# Patient Record
Sex: Male | Born: 1970 | Race: Black or African American | Hispanic: No | Marital: Single | Smoking: Never smoker
Health system: Southern US, Community
[De-identification: ages and names within clinical notes are randomized; demographics above are authoritative.]

## PROBLEM LIST (undated history)

## (undated) DIAGNOSIS — S46219A Strain of muscle, fascia and tendon of other parts of biceps, unspecified arm, initial encounter: Secondary | ICD-10-CM

## (undated) DIAGNOSIS — K219 Gastro-esophageal reflux disease without esophagitis: Secondary | ICD-10-CM

## (undated) DIAGNOSIS — I10 Essential (primary) hypertension: Secondary | ICD-10-CM

## (undated) DIAGNOSIS — R002 Palpitations: Secondary | ICD-10-CM

## (undated) DIAGNOSIS — E876 Hypokalemia: Secondary | ICD-10-CM

## (undated) DIAGNOSIS — R569 Unspecified convulsions: Secondary | ICD-10-CM

## (undated) DIAGNOSIS — E785 Hyperlipidemia, unspecified: Secondary | ICD-10-CM

## (undated) DIAGNOSIS — T7840XA Allergy, unspecified, initial encounter: Secondary | ICD-10-CM

## (undated) HISTORY — PX: WRIST SURGERY: SHX841

## (undated) HISTORY — DX: Gastro-esophageal reflux disease without esophagitis: K21.9

## (undated) HISTORY — PX: BRAIN SURGERY: SHX531

## (undated) HISTORY — DX: Allergy, unspecified, initial encounter: T78.40XA

## (undated) HISTORY — PX: KNEE ARTHROSCOPY: SUR90

## (undated) HISTORY — PX: FOOT SURGERY: SHX648

## (undated) HISTORY — DX: Hyperlipidemia, unspecified: E78.5

---
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
## 1997-08-26 ENCOUNTER — Emergency Department (HOSPITAL_COMMUNITY): Admission: EM | Admit: 1997-08-26 | Discharge: 1997-08-26 | Payer: Self-pay | Admitting: Emergency Medicine

## 1997-10-17 ENCOUNTER — Emergency Department (HOSPITAL_COMMUNITY): Admission: EM | Admit: 1997-10-17 | Discharge: 1997-10-17 | Payer: Self-pay | Admitting: Emergency Medicine

## 1998-04-14 ENCOUNTER — Emergency Department (HOSPITAL_COMMUNITY): Admission: EM | Admit: 1998-04-14 | Discharge: 1998-04-14 | Payer: Self-pay | Admitting: Emergency Medicine

## 1998-08-11 ENCOUNTER — Emergency Department (HOSPITAL_COMMUNITY): Admission: EM | Admit: 1998-08-11 | Discharge: 1998-08-11 | Payer: Self-pay | Admitting: Emergency Medicine

## 1998-08-15 ENCOUNTER — Emergency Department (HOSPITAL_COMMUNITY): Admission: EM | Admit: 1998-08-15 | Discharge: 1998-08-15 | Payer: Self-pay | Admitting: Emergency Medicine

## 1999-05-09 ENCOUNTER — Emergency Department (HOSPITAL_COMMUNITY): Admission: EM | Admit: 1999-05-09 | Discharge: 1999-05-10 | Payer: Self-pay | Admitting: Emergency Medicine

## 1999-06-16 ENCOUNTER — Emergency Department (HOSPITAL_COMMUNITY): Admission: EM | Admit: 1999-06-16 | Discharge: 1999-06-16 | Payer: Self-pay | Admitting: Emergency Medicine

## 1999-07-01 ENCOUNTER — Emergency Department (HOSPITAL_COMMUNITY): Admission: EM | Admit: 1999-07-01 | Discharge: 1999-07-01 | Payer: Self-pay | Admitting: *Deleted

## 2001-01-19 ENCOUNTER — Emergency Department (HOSPITAL_COMMUNITY): Admission: EM | Admit: 2001-01-19 | Discharge: 2001-01-19 | Payer: Self-pay | Admitting: *Deleted

## 2001-06-02 ENCOUNTER — Emergency Department (HOSPITAL_COMMUNITY): Admission: EM | Admit: 2001-06-02 | Discharge: 2001-06-02 | Payer: Self-pay | Admitting: Emergency Medicine

## 2001-06-11 ENCOUNTER — Emergency Department (HOSPITAL_COMMUNITY): Admission: EM | Admit: 2001-06-11 | Discharge: 2001-06-11 | Payer: Self-pay | Admitting: Emergency Medicine

## 2001-10-24 ENCOUNTER — Emergency Department (HOSPITAL_COMMUNITY): Admission: EM | Admit: 2001-10-24 | Discharge: 2001-10-25 | Payer: Self-pay | Admitting: Emergency Medicine

## 2003-06-21 ENCOUNTER — Emergency Department (HOSPITAL_COMMUNITY): Admission: EM | Admit: 2003-06-21 | Discharge: 2003-06-21 | Payer: Self-pay | Admitting: Emergency Medicine

## 2003-11-26 ENCOUNTER — Emergency Department (HOSPITAL_COMMUNITY): Admission: EM | Admit: 2003-11-26 | Discharge: 2003-11-27 | Payer: Self-pay | Admitting: Emergency Medicine

## 2003-11-27 IMAGING — CT CT ABDOMEN W/ CM
1 of 4 series · 14 of 32 positions shown, 19 images · IV contrast (omnipaque)
Comparison: none

CLINICAL DATA: Right-sided abdominal pain and swelling for 1 week.  Question of appendicitis.  The patient reports that he may have had his appendix removed in the past, but he is unsure. 
CT OF THE ABDOMEN AND PELVIS WITH CONTRAST
Multidetector helical CT imaging was performed, and an IV bolus injection of 150 cc Omnipaque 300 oral contrast was given.  
Abdomen:  There are scattered calcified granulomata in the liver and spleen.  Those organs otherwise appear normal.  The gallbladder, pancreas, adrenal glands, and kidneys appear normal.  No mass or inflammatory process is seen.  Surgical clips are noted posterior to the cecal tip on images 50 through 56, compatible with previous appendectomy.  There is no evidence of a distended appendix or pericecal inflammatory change.  The lung bases are clear.

[Series 3: — · axial · 0.70mm/px · z∈[+750,+1120]mm · 14 of 84 slices shown, 19 images]
[im 5/84  soft-tissue]
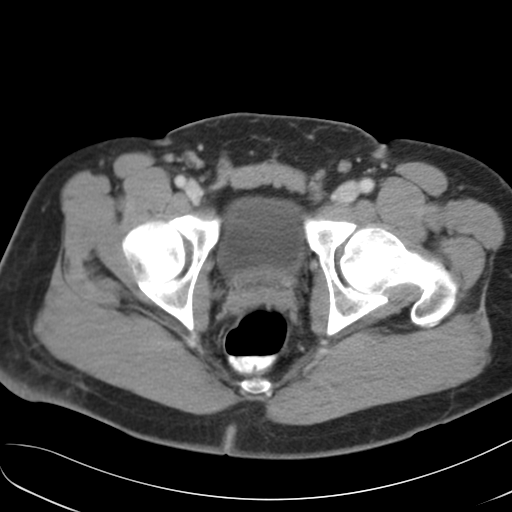
[im 5/84  bone]
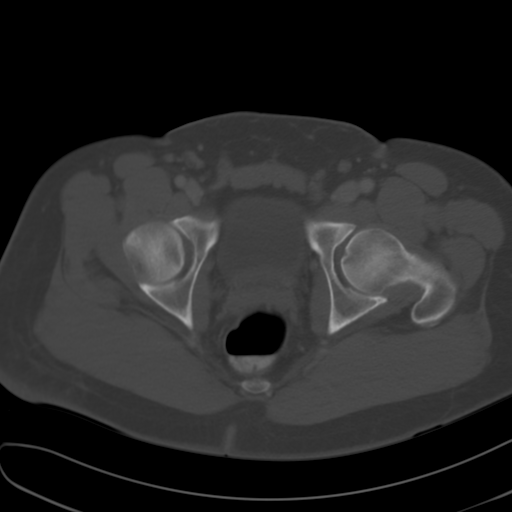
[im 10/84  soft-tissue]
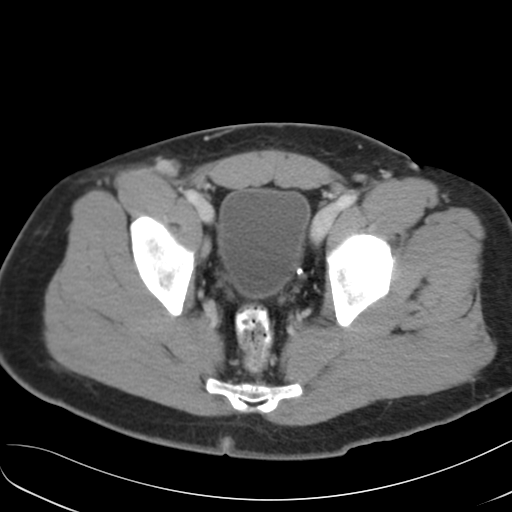
[im 19/84  soft-tissue]
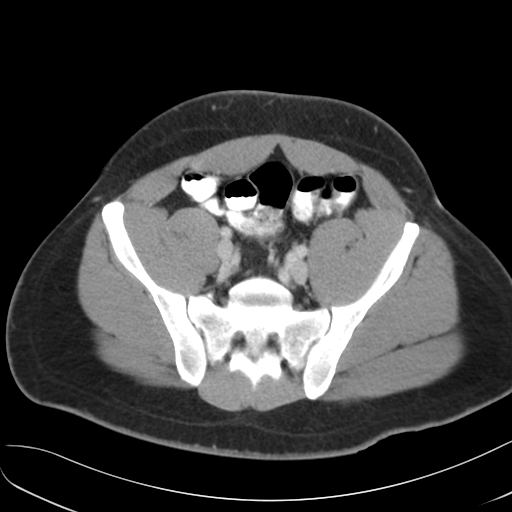
[im 24/84  soft-tissue]
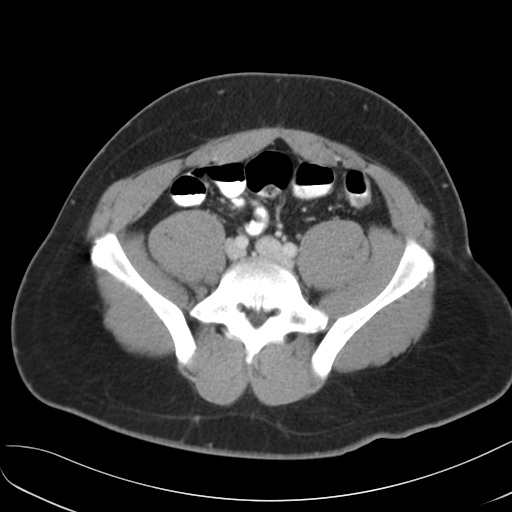
[im 28/84  soft-tissue]
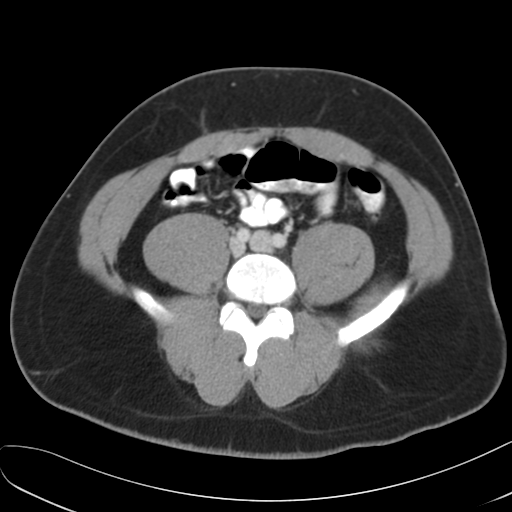
[im 37/84  soft-tissue]
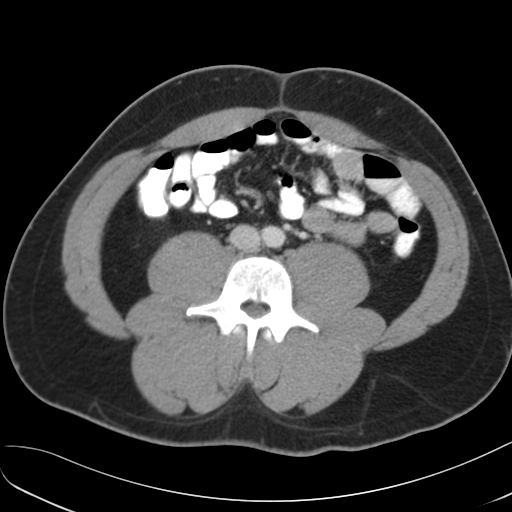
[im 42/84  soft-tissue]
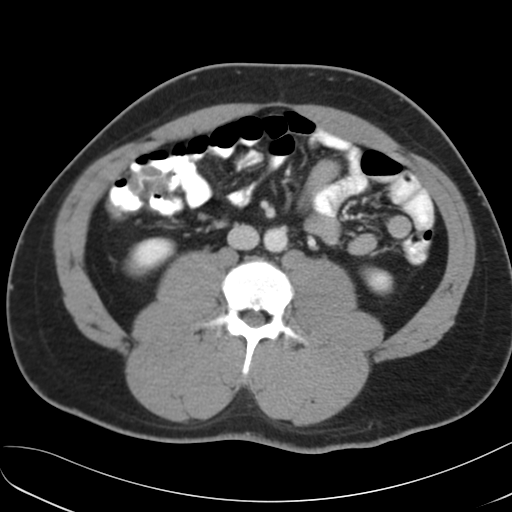
[im 47/84  soft-tissue]
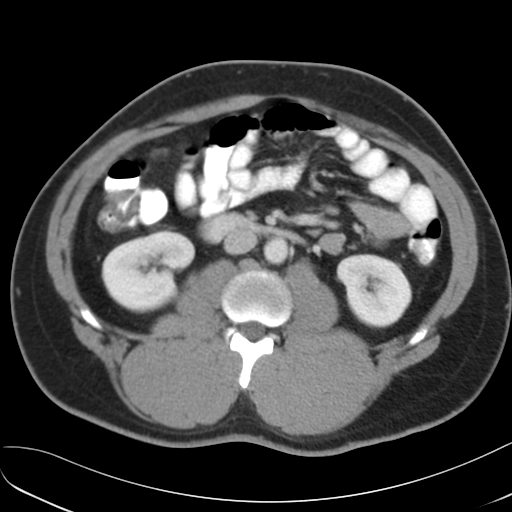
[im 56/84  soft-tissue]
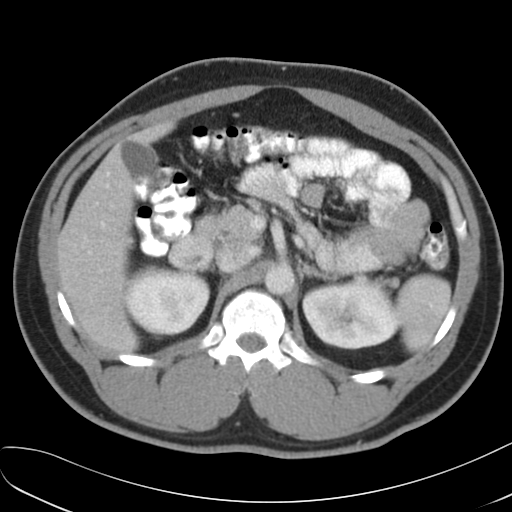
[im 56/84  bone]
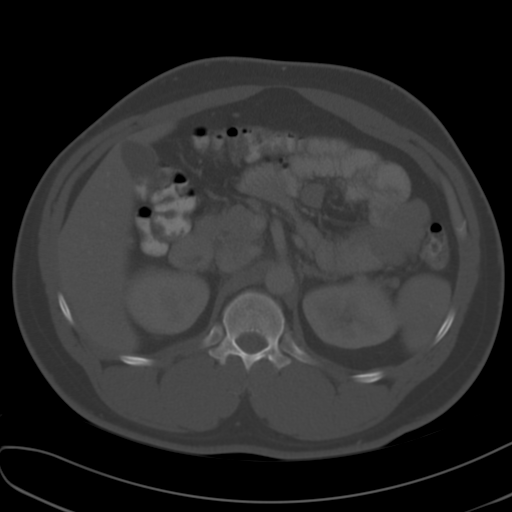
[im 60/84  soft-tissue]
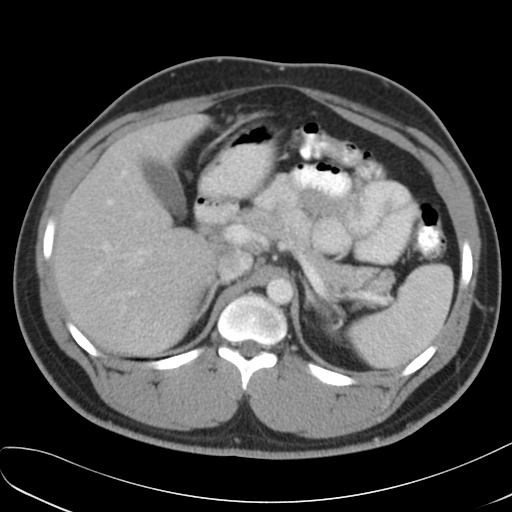
[im 65/84  soft-tissue]
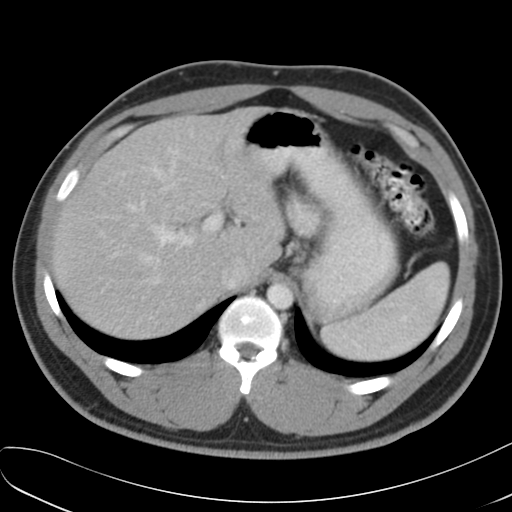
[im 65/84  lung]
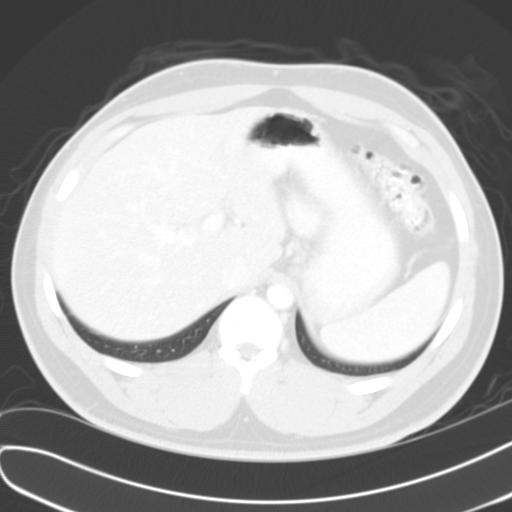
[im 70/84  lung]
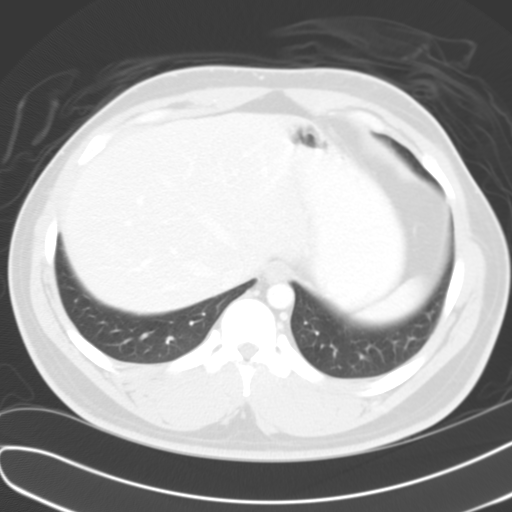
[im 74/84  soft-tissue]
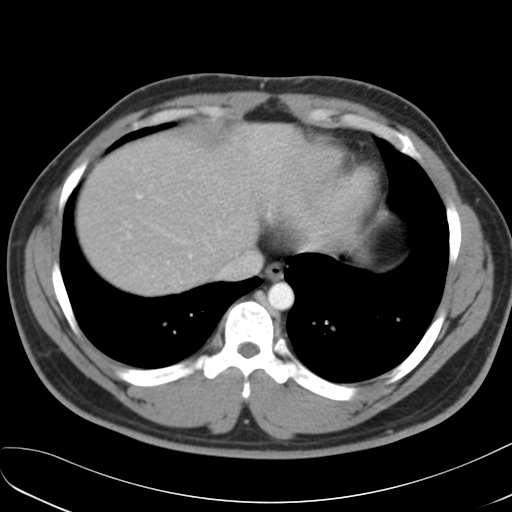
[im 74/84  lung]
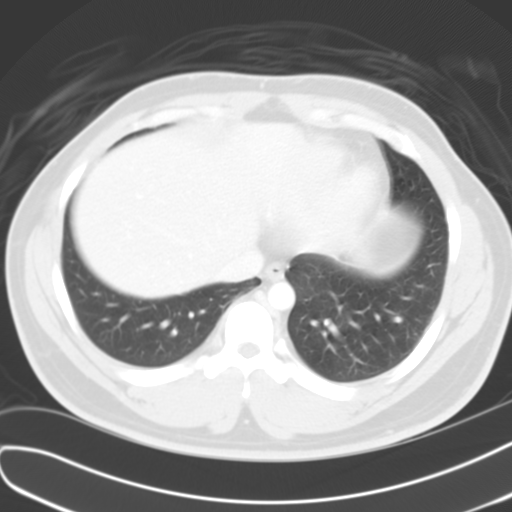
[im 79/84  soft-tissue]
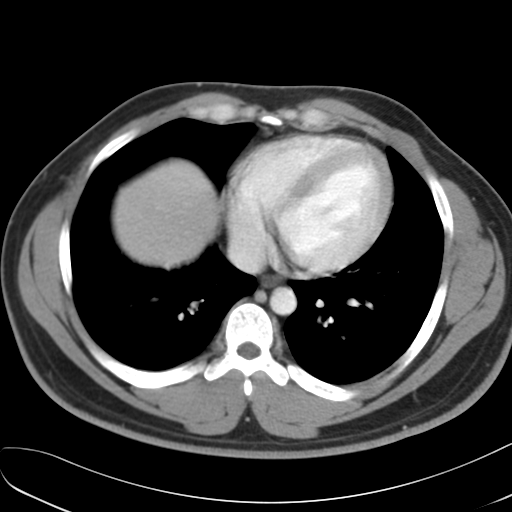
[im 79/84  lung]
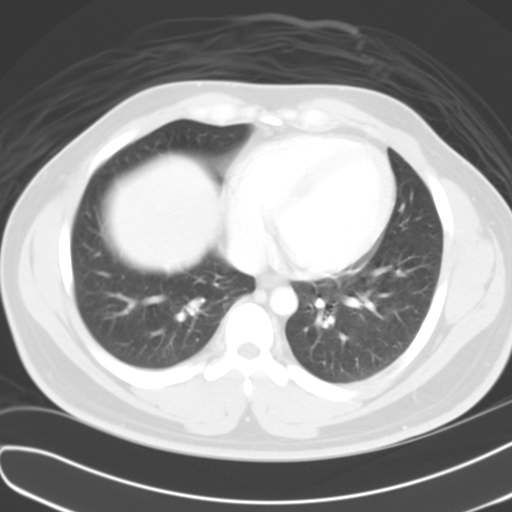

[14 of 32 positions shown; findings below may reference images not displayed]

IMPRESSION: 1.  The patient does indeed appear to be status post appendectomy. 
2.  There is no evidence of appendicitis or other active inflammatory process. 
Pelvis:  Surgical clip is noted between the seminal vesicles and the rectum.  The prostate gland appears mildly enlarged.  No pelvic mass, fluid collection, or inflammatory process is seen.  There is a phlebolith on the left and no evidence of ureterectasis.
IMPRESSION: Pelvic surgical clip, as described.  No active inflammatory process demonstrated.

## 2003-11-30 ENCOUNTER — Emergency Department (HOSPITAL_COMMUNITY): Admission: EM | Admit: 2003-11-30 | Discharge: 2003-11-30 | Payer: Self-pay | Admitting: Emergency Medicine

## 2003-12-05 ENCOUNTER — Emergency Department (HOSPITAL_COMMUNITY): Admission: EM | Admit: 2003-12-05 | Discharge: 2003-12-05 | Payer: Self-pay | Admitting: Emergency Medicine

## 2005-01-17 ENCOUNTER — Emergency Department (HOSPITAL_COMMUNITY): Admission: EM | Admit: 2005-01-17 | Discharge: 2005-01-17 | Payer: Self-pay | Admitting: Emergency Medicine

## 2006-06-08 ENCOUNTER — Emergency Department (HOSPITAL_COMMUNITY): Admission: EM | Admit: 2006-06-08 | Discharge: 2006-06-08 | Payer: Self-pay | Admitting: Emergency Medicine

## 2006-07-25 ENCOUNTER — Emergency Department (HOSPITAL_COMMUNITY): Admission: EM | Admit: 2006-07-25 | Discharge: 2006-07-25 | Payer: Self-pay | Admitting: Emergency Medicine

## 2010-10-29 ENCOUNTER — Emergency Department (HOSPITAL_COMMUNITY): Payer: No Typology Code available for payment source

## 2010-10-29 ENCOUNTER — Emergency Department (HOSPITAL_COMMUNITY)
Admission: EM | Admit: 2010-10-29 | Discharge: 2010-10-29 | Disposition: A | Discharge status: Home / Self Care | Payer: No Typology Code available for payment source | Attending: Emergency Medicine | Admitting: Emergency Medicine

## 2010-10-29 DIAGNOSIS — IMO0002 Reserved for concepts with insufficient information to code with codable children: Secondary | ICD-10-CM | POA: Insufficient documentation

## 2010-10-29 DIAGNOSIS — M542 Cervicalgia: Secondary | ICD-10-CM | POA: Insufficient documentation
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
## 2011-02-07 ENCOUNTER — Ambulatory Visit (HOSPITAL_COMMUNITY)
Admission: RE | Admit: 2011-02-07 | Discharge: 2011-02-07 | Disposition: A | Discharge status: Home / Self Care | Payer: Self-pay | Source: Ambulatory Visit | Attending: Orthopedic Surgery | Admitting: Orthopedic Surgery

## 2011-02-07 ENCOUNTER — Other Ambulatory Visit (HOSPITAL_COMMUNITY): Payer: Self-pay | Admitting: Orthopedic Surgery

## 2011-02-07 DIAGNOSIS — Z01818 Encounter for other preprocedural examination: Secondary | ICD-10-CM | POA: Insufficient documentation

## 2011-02-07 IMAGING — CR DG ORBITS FOR FOREIGN BODY
2 series · 2 of 2 positions shown · non-contrast
Comparison: None.

CLINICAL DATA: Pre MRI.

ORBITS FOR FOREIGN BODY - 2 VIEW

[w waters pa (1 of 2)]
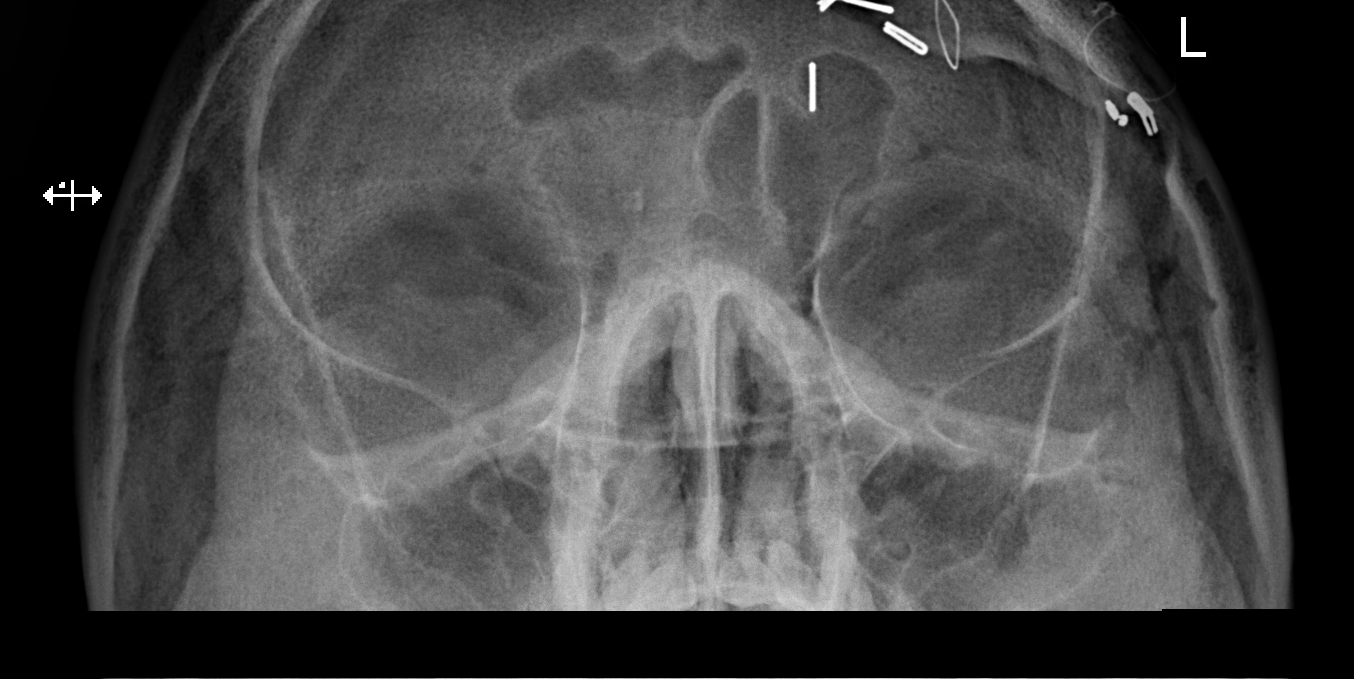

[w waters pa (2 of 2)]
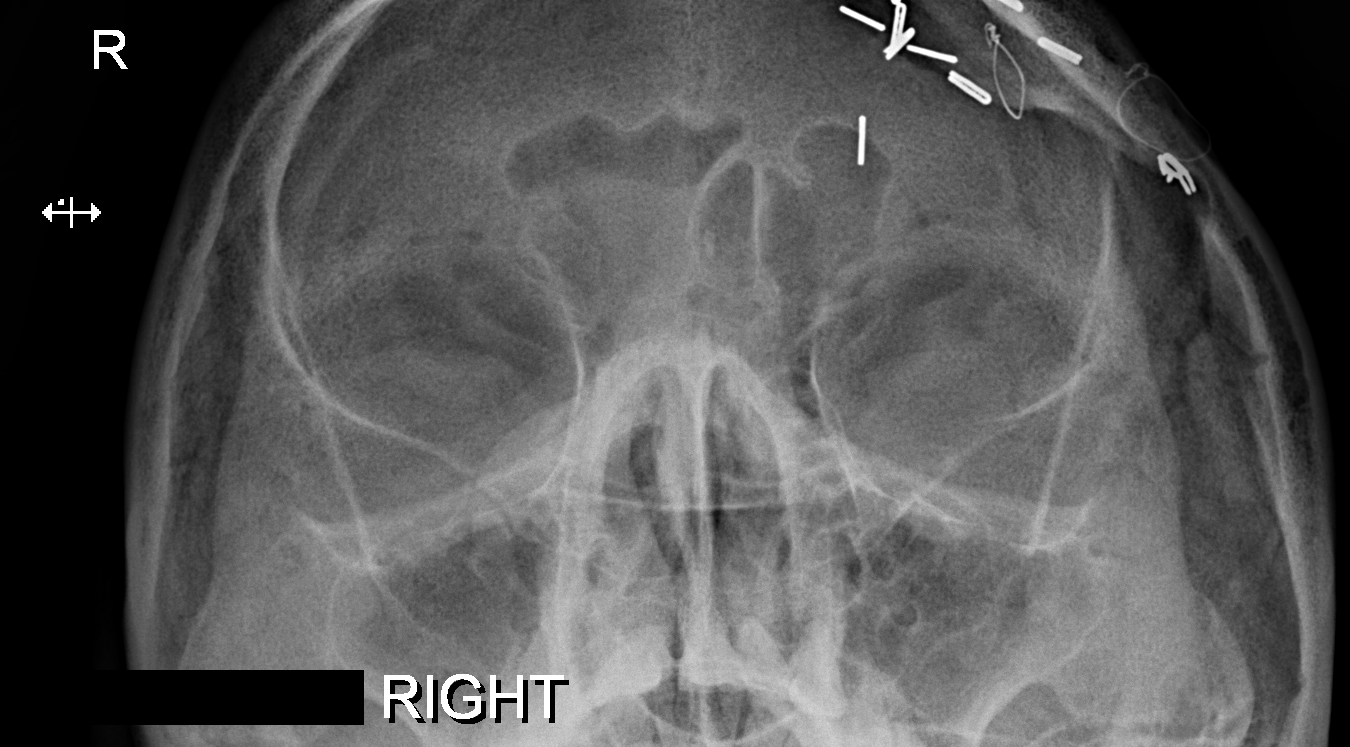

[2 of 2 positions shown; findings below may reference images not displayed]

FINDINGS: Negative for foreign body within the orbit.

Numerous surgical clips are present in the left frontal region from
prior craniotomy.  Craniotomy flap has been attached with wire
sutures.
IMPRESSION: Negative for orbital foreign body.

Numerous surgical clips overlying the left frontal lobe related to
prior craniotomy.  I am not able to determine if these are
intracranial or extracranial in location.  I would suggest CT of
the head for further evaluation to determine if the patient can
safely have MRI scanning.

## 2011-02-17 ENCOUNTER — Other Ambulatory Visit: Payer: Self-pay | Admitting: Orthopedic Surgery

## 2011-02-17 DIAGNOSIS — M545 Low back pain: Secondary | ICD-10-CM

## 2011-02-23 ENCOUNTER — Ambulatory Visit
Admission: RE | Admit: 2011-02-23 | Discharge: 2011-02-23 | Disposition: A | Discharge status: Home / Self Care | Payer: No Typology Code available for payment source | Source: Ambulatory Visit | Attending: Orthopedic Surgery | Admitting: Orthopedic Surgery

## 2011-02-23 DIAGNOSIS — M545 Low back pain, unspecified: Secondary | ICD-10-CM
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
## 2011-06-06 ENCOUNTER — Other Ambulatory Visit: Payer: Self-pay | Admitting: Orthopedic Surgery

## 2011-06-06 DIAGNOSIS — M48061 Spinal stenosis, lumbar region without neurogenic claudication: Secondary | ICD-10-CM

## 2011-06-07 ENCOUNTER — Inpatient Hospital Stay: Admission: RE | Admit: 2011-06-07 | Payer: No Typology Code available for payment source | Source: Ambulatory Visit

## 2011-06-08 ENCOUNTER — Ambulatory Visit
Admission: RE | Admit: 2011-06-08 | Discharge: 2011-06-08 | Disposition: A | Discharge status: Home / Self Care | Payer: No Typology Code available for payment source | Source: Ambulatory Visit | Attending: Orthopedic Surgery | Admitting: Orthopedic Surgery

## 2011-06-08 DIAGNOSIS — M48061 Spinal stenosis, lumbar region without neurogenic claudication: Secondary | ICD-10-CM

## 2011-06-08 MED ORDER — IOHEXOL 180 MG/ML  SOLN
1.0000 mL | Freq: Once | INTRAMUSCULAR | Status: AC | PRN
  Administered 2011-06-08: 1 mL via EPIDURAL

## 2011-06-08 MED ORDER — METHYLPREDNISOLONE ACETATE 40 MG/ML INJ SUSP (RADIOLOG
120.0000 mg | Freq: Once | INTRAMUSCULAR | Status: AC
  Administered 2011-06-08: 120 mg via EPIDURAL

## 2011-08-03 ENCOUNTER — Other Ambulatory Visit: Payer: Self-pay | Admitting: Orthopedic Surgery

## 2011-08-03 DIAGNOSIS — M4800 Spinal stenosis, site unspecified: Secondary | ICD-10-CM

## 2011-08-09 ENCOUNTER — Ambulatory Visit
Admission: RE | Admit: 2011-08-09 | Discharge: 2011-08-09 | Disposition: A | Discharge status: Home / Self Care | Payer: Self-pay | Source: Ambulatory Visit | Attending: Orthopedic Surgery | Admitting: Orthopedic Surgery

## 2011-08-09 VITALS — BP 115/65 | HR 72

## 2011-08-09 DIAGNOSIS — M4800 Spinal stenosis, site unspecified: Secondary | ICD-10-CM
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
## 2011-08-09 MED ORDER — IOHEXOL 180 MG/ML  SOLN
20.0000 mL | Freq: Once | INTRAMUSCULAR | Status: AC | PRN
  Administered 2011-08-09: 20 mL via INTRATHECAL

## 2011-08-09 MED ORDER — DIAZEPAM 5 MG PO TABS
10.0000 mg | ORAL_TABLET | Freq: Once | ORAL | Status: AC
  Administered 2011-08-09: 10 mg via ORAL

## 2011-08-09 MED ORDER — MEPERIDINE HCL 100 MG/ML IJ SOLN
75.0000 mg | Freq: Once | INTRAMUSCULAR | Status: AC
  Administered 2011-08-09: 75 mg via INTRAMUSCULAR

## 2011-08-09 MED ORDER — ONDANSETRON HCL 4 MG/2ML IJ SOLN
4.0000 mg | Freq: Once | INTRAMUSCULAR | Status: AC
  Administered 2011-08-09: 4 mg via INTRAMUSCULAR

## 2011-08-09 NOTE — Progress Notes (Signed)
Resting comfortably on stomach on stretcher in nursing station awaiting CT scan.  jkl

## 2011-08-09 NOTE — Discharge Instructions (Signed)

## 2011-08-24 ENCOUNTER — Other Ambulatory Visit: Payer: Self-pay | Admitting: Orthopedic Surgery

## 2011-08-24 DIAGNOSIS — M48061 Spinal stenosis, lumbar region without neurogenic claudication: Secondary | ICD-10-CM

## 2011-08-25 ENCOUNTER — Ambulatory Visit
Admission: RE | Admit: 2011-08-25 | Discharge: 2011-08-25 | Disposition: A | Discharge status: Home / Self Care | Payer: Self-pay | Source: Ambulatory Visit | Attending: Orthopedic Surgery | Admitting: Orthopedic Surgery

## 2011-08-25 VITALS — BP 138/75 | HR 67

## 2011-08-25 DIAGNOSIS — M48061 Spinal stenosis, lumbar region without neurogenic claudication: Secondary | ICD-10-CM

## 2011-08-25 DIAGNOSIS — M5126 Other intervertebral disc displacement, lumbar region: Secondary | ICD-10-CM

## 2011-08-25 MED ORDER — IOHEXOL 180 MG/ML  SOLN
1.0000 mL | Freq: Once | INTRAMUSCULAR | Status: AC | PRN
  Administered 2011-08-25: 1 mL via EPIDURAL

## 2011-08-25 MED ORDER — METHYLPREDNISOLONE ACETATE 40 MG/ML INJ SUSP (RADIOLOG
120.0000 mg | Freq: Once | INTRAMUSCULAR | Status: AC
  Administered 2011-08-25: 120 mg via EPIDURAL

## 2011-08-25 NOTE — Discharge Instructions (Signed)
Post Procedure Spinal Discharge Instruction Sheet  1. You may resume a regular diet and any medications that you routinely take (including pain medications).  2. No driving day of procedure.  3. Light activity throughout the rest of the day.  Do not do any strenuous work, exercise, bending or lifting.  The day following the procedure, you can resume normal physical activity but you should refrain from exercising or physical therapy for at least three days thereafter.   Common Side Effects:   Headaches- take your usual medications as directed by your physician.  Increase your fluid intake.  Caffeinated beverages may be helpful.  Lie flat in bed until your headache resolves.   Restlessness or inability to sleep- you may have trouble sleeping for the next few days.  Ask your referring physician if you need any medication for sleep.   Facial flushing or redness- should subside within a few days.   Increased pain- a temporary increase in pain a day or two following your procedure is not unusual.  Take your pain medication as prescribed by your referring physician.   Leg cramps  Please contact our office at [phone] for the following symptoms:  Fever greater than 100 degrees.  Headaches unresolved with medication after 2-3 days.  Increased swelling, pain, or redness at injection site.  Thank you for visiting our office.

## 2012-03-22 ENCOUNTER — Encounter (HOSPITAL_COMMUNITY): Payer: Self-pay | Admitting: *Deleted

## 2012-03-22 ENCOUNTER — Emergency Department (HOSPITAL_COMMUNITY)
Admission: EM | Admit: 2012-03-22 | Discharge: 2012-03-22 | Disposition: A | Discharge status: Home / Self Care | Payer: Self-pay | Attending: Emergency Medicine | Admitting: Emergency Medicine

## 2012-03-22 DIAGNOSIS — K0889 Other specified disorders of teeth and supporting structures: Secondary | ICD-10-CM

## 2012-03-22 DIAGNOSIS — K089 Disorder of teeth and supporting structures, unspecified: Secondary | ICD-10-CM | POA: Insufficient documentation

## 2012-03-22 MED ORDER — OXYCODONE-ACETAMINOPHEN 5-325 MG PO TABS: 1.0000 | ORAL_TABLET | Freq: Four times a day (QID) | ORAL | Status: DC | PRN

## 2012-03-22 MED ORDER — PENICILLIN V POTASSIUM 500 MG PO TABS: 500.0000 mg | ORAL_TABLET | Freq: Four times a day (QID) | ORAL | Status: AC

## 2012-03-22 NOTE — ED Notes (Signed)
Pt alert and oriented x4. Respirations even and unlabored, bilateral symmetrical rise and fall of chest. Skin warm and dry. In no acute distress. Denies needs.   

## 2012-03-22 NOTE — ED Provider Notes (Signed)
History     CSN: [id number]  Arrival date & time 03/22/12  7829   First MD Initiated Contact with Patient 03/22/12 [phone]      Chief Complaint  Patient presents with  . Dental Pain    (Consider location/radiation/quality/duration/timing/severity/associated sxs/prior treatment) HPI Comments: Patient presents today with a chief complaint of dental pain.  Pain has been intermittent over the past month.  Pain located on the upper and lower right molar teeth.  Pain worse with chewing.  He has taken Advil for pain, which helps somewhat.  He does not have a dentist.  Patient is a 41 y.o. male presenting with tooth pain. The history is provided by the patient.  Dental PainThe primary symptoms include mouth pain. Primary symptoms do not include dental injury, oral bleeding, oral lesions or fever. Episode onset: one month ago. The symptoms are worsening.  Additional symptoms include: dental sensitivity to temperature, gum swelling and gum tenderness. Additional symptoms do not include: purulent gums, trismus, facial swelling, trouble swallowing, pain with swallowing and drooling.    History reviewed. No pertinent past medical history.  Past Surgical History  Procedure Date  . Brain surgery     1981 blot clot removed  . Foot surgery     right bunion removed    History reviewed. No pertinent family history.  History  Substance Use Topics  . Smoking status: Never Smoker   . Smokeless tobacco: Never Used  . Alcohol Use: Yes     occasional      Review of Systems  Constitutional: Negative for fever and chills.  HENT: Positive for dental problem. Negative for facial swelling, drooling, trouble swallowing, neck pain and neck stiffness.   Gastrointestinal: Negative for nausea and vomiting.    Allergies  Review of patient's allergies indicates no known allergies.  Home Medications  No current outpatient prescriptions on file.  BP 149/83  Pulse 76  Temp 99.1 F (37.3 C) (Oral)   Resp 16  SpO2 97%  Physical Exam  Nursing note and vitals reviewed. Constitutional: He is oriented to person, place, and time. He appears well-developed and well-nourished. No distress.  HENT:  Head: Normocephalic and atraumatic. No trismus in the jaw.  Mouth/Throat: Uvula is midline, oropharynx is clear and moist and mucous membranes are normal. Abnormal dentition. No dental abscesses or uvula swelling. No oropharyngeal exudate, posterior oropharyngeal edema, posterior oropharyngeal erythema or tonsillar abscesses.       Poor dental hygiene. Pt able to open and close mouth with out difficulty. Airway intact. Uvula midline. Mild gingival swelling with tenderness over affected area, but no fluctuance. No swelling or tenderness of submental and submandibular regions.  Neck: Normal range of motion and full passive range of motion without pain. Neck supple.  Cardiovascular: Normal rate and regular rhythm.   Pulmonary/Chest: Effort normal and breath sounds normal.  Musculoskeletal: Normal range of motion.  Lymphadenopathy:       Head (right side): No submental, no submandibular, no tonsillar, no preauricular and no posterior auricular adenopathy present.       Head (left side): No submental, no submandibular, no tonsillar, no preauricular and no posterior auricular adenopathy present.    He has no cervical adenopathy.  Neurological: He is alert and oriented to person, place, and time.  Skin: Skin is warm and dry. No rash noted. He is not diaphoretic.    ED Course  Procedures (including critical care time)  Labs Reviewed - No data to display No results found.  No diagnosis found.    MDM  Patient with toothache.  No gross abscess.  Exam unconcerning for Ludwig's angina or spread of infection.  Will treat with penicillin and pain medicine.  Urged patient to follow-up with dentist.          Pascal Lux [place], PA-C 03/22/12 [phone]

## 2012-03-22 NOTE — ED Notes (Signed)
Pt reports upper and lower right sided tooth pain, pain has increased over last month 10/10 to where pt cannot eat on right side of mouth. Has not seen dentist for complaint. Has had 2 teeth removed from bottom right side. Reports bottom gum is swollen. Does not have a primary dentist.

## 2012-03-23 NOTE — ED Provider Notes (Signed)
Medical screening examination/treatment/procedure(s) were performed by non-physician practitioner and as supervising physician I was immediately available for consultation/collaboration.  Bensyn Bornemann L Tayson Schnelle, MD 03/23/12 0957 

## 2012-06-07 ENCOUNTER — Emergency Department (HOSPITAL_BASED_OUTPATIENT_CLINIC_OR_DEPARTMENT_OTHER)
Admission: EM | Admit: 2012-06-07 | Discharge: 2012-06-07 | Disposition: A | Discharge status: Home / Self Care | Payer: Self-pay | Attending: Emergency Medicine | Admitting: Emergency Medicine

## 2012-06-07 ENCOUNTER — Encounter (HOSPITAL_BASED_OUTPATIENT_CLINIC_OR_DEPARTMENT_OTHER): Payer: Self-pay | Admitting: *Deleted

## 2012-06-07 DIAGNOSIS — K0889 Other specified disorders of teeth and supporting structures: Secondary | ICD-10-CM

## 2012-06-07 DIAGNOSIS — K089 Disorder of teeth and supporting structures, unspecified: Secondary | ICD-10-CM | POA: Insufficient documentation

## 2012-06-07 DIAGNOSIS — R221 Localized swelling, mass and lump, neck: Secondary | ICD-10-CM | POA: Insufficient documentation

## 2012-06-07 DIAGNOSIS — R220 Localized swelling, mass and lump, head: Secondary | ICD-10-CM | POA: Insufficient documentation

## 2012-06-07 MED ORDER — HYDROCODONE-ACETAMINOPHEN 5-325 MG PO TABS: 2.0000 | ORAL_TABLET | ORAL | Status: DC | PRN

## 2012-06-07 MED ORDER — AMOXICILLIN 500 MG PO CAPS: 500.0000 mg | ORAL_CAPSULE | Freq: Three times a day (TID) | ORAL | Status: DC

## 2012-06-07 NOTE — ED Provider Notes (Signed)
Medical screening examination/treatment/procedure(s) were performed by non-physician practitioner and as supervising physician I was immediately available for consultation/collaboration.   Sione Baumgarten B. Bernette Mayers, MD 06/07/12 1902

## 2012-06-07 NOTE — ED Notes (Signed)
Toothache x 2 days 

## 2012-06-07 NOTE — ED Provider Notes (Signed)
History     CSN: [id number]  Arrival date & time 06/07/12  1719   First MD Initiated Contact with Patient 06/07/12 1805      Chief Complaint  Patient presents with  . Dental Pain    (Consider location/radiation/quality/duration/timing/severity/associated sxs/prior treatment) Patient is a 42 y.o. male presenting with tooth pain. The history is provided by the patient. No language interpreter was used.  Dental PainThe primary symptoms include mouth pain. The symptoms began 2 days ago. The symptoms are worsening. The symptoms are new. The symptoms occur constantly.  Additional symptoms include: gum swelling and gum tenderness.    History reviewed. No pertinent past medical history.  Past Surgical History  Procedure Date  . Brain surgery     1981 blot clot removed  . Foot surgery     right bunion removed    No family history on file.  History  Substance Use Topics  . Smoking status: Never Smoker   . Smokeless tobacco: Never Used  . Alcohol Use: Yes     Comment: occasional      Review of Systems  HENT: Positive for dental problem.   All other systems reviewed and are negative.    Allergies  Review of patient's allergies indicates no known allergies.  Home Medications   Current Outpatient Rx  Name  Route  Sig  Dispense  Refill  . IBUPROFEN-DIPHENHYDRAMINE CIT 200-38 MG PO TABS   Oral   Take 2 tablets by mouth at bedtime as needed.         . OXYCODONE-ACETAMINOPHEN 5-325 MG PO TABS   Oral   Take 1-2 tablets by mouth every 6 (six) hours as needed for pain.   20 tablet   0     BP 152/92  Pulse 82  Temp 99 F (37.2 C) (Oral)  Resp 20  SpO2 100%  Physical Exam  Nursing note and vitals reviewed. Constitutional: He appears well-developed.  HENT:       No swelling, gum normal  Eyes: Conjunctivae normal are normal. Pupils are equal, round, and reactive to light.  Musculoskeletal: Normal range of motion.  Neurological: He is alert.  Skin: Skin is  warm.  Psychiatric: He has a normal mood and affect.    ED Course  Procedures (including critical care time)  Labs Reviewed - No data to display No results found.   No diagnosis found.    MDM  Pt advised to schedule to see the dentist for evaluation       Elson Areas, [place] 06/07/12 1901

## 2012-06-12 ENCOUNTER — Emergency Department (HOSPITAL_COMMUNITY)
Admission: EM | Admit: 2012-06-12 | Discharge: 2012-06-12 | Disposition: A | Discharge status: Home / Self Care | Payer: Self-pay | Attending: Emergency Medicine | Admitting: Emergency Medicine

## 2012-06-12 ENCOUNTER — Encounter (HOSPITAL_COMMUNITY): Payer: Self-pay | Admitting: *Deleted

## 2012-06-12 DIAGNOSIS — K0889 Other specified disorders of teeth and supporting structures: Secondary | ICD-10-CM

## 2012-06-12 DIAGNOSIS — K089 Disorder of teeth and supporting structures, unspecified: Secondary | ICD-10-CM | POA: Insufficient documentation

## 2012-06-12 DIAGNOSIS — Z8669 Personal history of other diseases of the nervous system and sense organs: Secondary | ICD-10-CM | POA: Insufficient documentation

## 2012-06-12 HISTORY — DX: Unspecified convulsions: R56.9

## 2012-06-12 MED ORDER — OXYCODONE-ACETAMINOPHEN 5-325 MG PO TABS: 1.0000 | ORAL_TABLET | Freq: Four times a day (QID) | ORAL | Status: DC | PRN

## 2012-06-12 MED ORDER — NAPROXEN 500 MG PO TABS: 500.0000 mg | ORAL_TABLET | Freq: Two times a day (BID) | ORAL | Status: DC

## 2012-06-12 NOTE — ED Provider Notes (Signed)
History     CSN: [id number]  Arrival date & time 06/12/12  1127   First MD Initiated Contact with Patient 06/12/12 1304      Chief Complaint  Patient presents with  . Dental Pain    (Consider location/radiation/quality/duration/timing/severity/associated sxs/prior treatment) HPI Comments: Patient presents with history of dental pain with right lower tooth pain x 1 week. He has had pain in this tooth for the past 2 months, however it has gotten worse over the past week. He was evaluated at the ED 5 days ago and was prescribed Vicodin and antibiotics. He states his pain has gotten severe over the past 24 hours. It is painful for him to chew foods and drink cold liquids. He states his tooth hurts "even when the wind blows." He states the Vicodin has not helped with his pain. He does not have a dentist so at his last ED visit he was given a referral to a dentist. He tried to call the dentist but they informed him that they do not do tooth extractions. Onset gradual, course persistent.   Patient is a 42 y.o. male presenting with tooth pain. The history is provided by the patient.  Dental PainPrimary symptoms do not include headaches, fever, shortness of breath or sore throat.  Additional symptoms do not include: facial swelling, trouble swallowing, ear pain and fatigue.    Past Medical History  Diagnosis Date  . Seizures     Past Surgical History  Procedure Date  . Brain surgery     1981 blot clot removed  . Foot surgery     right bunion removed    History reviewed. No pertinent family history.  History  Substance Use Topics  . Smoking status: Never Smoker   . Smokeless tobacco: Never Used  . Alcohol Use: Yes     Comment: occasional      Review of Systems  Constitutional: Negative for fever, chills and fatigue.  HENT: Positive for dental problem. Negative for ear pain, congestion, sore throat, facial swelling, rhinorrhea, trouble swallowing, neck pain and neck stiffness.     Eyes: Negative for pain.  Respiratory: Negative for shortness of breath and stridor.   Skin: Negative for color change.  Neurological: Negative for headaches.    Allergies  Review of patient's allergies indicates no known allergies.  Home Medications   Current Outpatient Rx  Name  Route  Sig  Dispense  Refill  . HYDROCODONE-ACETAMINOPHEN 5-325 MG PO TABS   Oral   Take 1 tablet by mouth every 6 (six) hours as needed. pain           BP 160/83  Pulse 81  Temp 98.5 F (36.9 C) (Oral)  Resp 18  SpO2 100%  Physical Exam  Nursing note and vitals reviewed. Constitutional: He appears well-developed and well-nourished.  HENT:  Head: Normocephalic and atraumatic. No trismus in the jaw.  Right Ear: Tympanic membrane, external ear and ear canal normal.  Left Ear: Tympanic membrane, external ear and ear canal normal.  Nose: Nose normal.  Mouth/Throat: Uvula is midline, oropharynx is clear and moist and mucous membranes are normal. Abnormal dentition. Dental caries present. No dental abscesses, uvula swelling or lacerations. No oropharyngeal exudate or tonsillar abscesses.         Patient reports pain on right lower tooth.   Eyes: Conjunctivae normal and EOM are normal. Pupils are equal, round, and reactive to light.  Neck: Normal range of motion. Neck supple.  No neck swelling or Lugwig's angina  Lymphadenopathy:    He has no cervical adenopathy.  Neurological: He is alert.  Skin: Skin is warm and dry.  Psychiatric: He has a normal mood and affect.    ED Course  Procedures (including critical care time)  Labs Reviewed - No data to display No results found.   1. Pain, dental     Patient seen and examined.    Vital signs reviewed and are as follows: Filed Vitals:   06/12/12 1402  BP: 137/80  Pulse:   Temp:   Resp:   BP 137/80  Pulse 81  Temp 98.5 F (36.9 C) (Oral)  Resp 18  SpO2 100%   Patient counseled to take prescribed medications as directed,  return with worsening facial or neck swelling, and to follow-up with their dentist as soon as possible.   Patient counseled on use of narcotic pain medications. Counseled not to combine these medications with others containing tylenol. Urged not to drink alcohol, drive, or perform any other activities that requires focus while taking these medications. The patient verbalizes understanding and agrees with the plan.    MDM  Patient with toothache.  No gross abscess.  Exam unconcerning for Ludwig's angina or other deep tissue infection in neck.  Urged patient to follow-up with dentist.           Renne Crigler, PA 06/12/12 [phone]

## 2012-06-12 NOTE — Progress Notes (Signed)
During 06/12/12 [facility] ED visit Pt listed with no insurance coverage CM and [facility] liaison spoke with pt.  Pt offered services to assist with finding a [place] self pay provider & health reform information-- 

## 2012-06-12 NOTE — ED Provider Notes (Signed)
Medical screening examination/treatment/procedure(s) were performed by non-physician practitioner and as supervising physician I was immediately available for consultation/collaboration.  Wil Slape, MD 06/12/12 1629 

## 2012-06-12 NOTE — ED Notes (Signed)
Pt states having toothache on R lower side of mouth, unable to say how long he has had the toothache but states now it hurts all the time and pain is getting worse, where it use to hurt if he ate on the R side.

## 2013-01-05 ENCOUNTER — Encounter (HOSPITAL_COMMUNITY): Payer: Self-pay | Admitting: Emergency Medicine

## 2013-01-05 ENCOUNTER — Emergency Department (HOSPITAL_COMMUNITY)
Admission: EM | Admit: 2013-01-05 | Discharge: 2013-01-05 | Disposition: A | Discharge status: Home / Self Care | Payer: Self-pay | Attending: Emergency Medicine | Admitting: Emergency Medicine

## 2013-01-05 DIAGNOSIS — S30860A Insect bite (nonvenomous) of lower back and pelvis, initial encounter: Secondary | ICD-10-CM | POA: Insufficient documentation

## 2013-01-05 DIAGNOSIS — G40909 Epilepsy, unspecified, not intractable, without status epilepticus: Secondary | ICD-10-CM | POA: Insufficient documentation

## 2013-01-05 DIAGNOSIS — Y929 Unspecified place or not applicable: Secondary | ICD-10-CM | POA: Insufficient documentation

## 2013-01-05 DIAGNOSIS — Y9389 Activity, other specified: Secondary | ICD-10-CM | POA: Insufficient documentation

## 2013-01-05 DIAGNOSIS — W57XXXA Bitten or stung by nonvenomous insect and other nonvenomous arthropods, initial encounter: Secondary | ICD-10-CM | POA: Insufficient documentation

## 2013-01-05 NOTE — ED Notes (Signed)
Pt alert, nad, arrives from home, c/o ? Bite to mid back, unknown source, onset last week, resp even unlabored, skin pwd

## 2013-01-05 NOTE — ED Provider Notes (Signed)
CSN: [id number]     Arrival date & time 01/05/13  1442 History  This chart was scribed for Michael Sites, PA, working with Michael Roots, MD, by Michael Fleming ED Scribe. This patient was seen in room WTR8/WTR8 and the patient's care was started at 3:11 PM.   Chief Complaint  Patient presents with  . Wound Check    Mid Back    The history is provided by the patient. No language interpreter was used.    HPI Comments: EIN Michael Fleming is a 42 y.o. male who presents to the Emergency Department complaining of an insect bite to his middle back that occurred 3 days ago. He reports that he felt "a bug", although he is not sure which kind, was in his shirt and bit him in the back 3 days ago. He reports gradual onset, gradually worsening, mild pain surrounding the area of the bite. He denies associated itching or drainage from the wound. He denies having any other insect bites. He states that he has applied Neosporin without relief. He denies fever, chills, nausea, vomiting, rash, breathing difficulty, trouble swallowing or any other symptoms.   PCP- None   Past Medical History  Diagnosis Date  . Seizures    Past Surgical History  Procedure Laterality Date  . Brain surgery      1981 blot clot removed  . Foot surgery      right bunion removed   History reviewed. No pertinent family history. History  Substance Use Topics  . Smoking status: Never Smoker   . Smokeless tobacco: Never Used  . Alcohol Use: Yes     Comment: occasional    Review of Systems  Constitutional: Negative for fever and chills.  HENT: Negative for trouble swallowing.   Respiratory: Negative for shortness of breath and wheezing.   Gastrointestinal: Negative for nausea and vomiting.  Skin: Positive for wound (Insect bite). Negative for rash.  All other systems reviewed and are negative.    Allergies  Review of patient's allergies indicates no known allergies.  Home Medications  No current outpatient  prescriptions on file.  Triage Vitals: BP 159/80  Pulse 87  Temp(Src) 98 F (36.7 C) (Oral)  Resp 16  Wt 203 lb (92.08 kg)  BMI 31.79 kg/m2  SpO2 99%  Physical Exam  Nursing note and vitals reviewed. Constitutional: He is oriented to person, place, and time. He appears well-developed and well-nourished.  HENT:  Head: Normocephalic and atraumatic.  Eyes: Conjunctivae and EOM are normal. Pupils are equal, round, and reactive to light.  Neck: Normal range of motion. Neck supple.  Cardiovascular: Normal rate, regular rhythm and normal heart sounds.   Pulmonary/Chest: Effort normal and breath sounds normal.  Musculoskeletal: Normal range of motion.  Neurological: He is alert and oriented to person, place, and time.  Skin: Skin is warm and dry.  Mid thoracic lesion, mildly tender to palpation. No surrounding abscess, erythema, induration, drainage or signs of cellulitis.  Psychiatric: He has a normal mood and affect.    ED Course   Procedures (including critical care time)  DIAGNOSTIC STUDIES: Oxygen Saturation is 99% on RA, normal by my interpretation.    COORDINATION OF CARE: 3:16 PM- Pt advised of plan for treatment and pt agrees.   Labs Reviewed - No data to display  No results found.  1. Bug bite     MDM   Dog bite to midthoracic back without localized infection or abscess formation. Instructed to continue home wound care.  May take over-the-counter Benadryl as needed. Follow up with [facility] if symptoms change/worsen. Discussed plan with patient, he agreed. Return precautions advised.  I personally performed the services described in this documentation, which was scribed in my presence. The recorded information has been reviewed and is accurate.    Garlon Hatchet, PA-C 01/05/13 [phone]

## 2013-01-06 NOTE — ED Provider Notes (Signed)
Medical screening examination/treatment/procedure(s) were performed by non-physician practitioner and as supervising physician I was immediately available for consultation/collaboration.   Shaketa Serafin E Viren Lebeau, MD 01/06/13 1314 

## 2013-02-10 ENCOUNTER — Encounter (HOSPITAL_COMMUNITY): Payer: Self-pay | Admitting: Emergency Medicine

## 2013-02-10 ENCOUNTER — Emergency Department (HOSPITAL_COMMUNITY)
Admission: EM | Admit: 2013-02-10 | Discharge: 2013-02-10 | Disposition: A | Discharge status: Home / Self Care | Payer: No Typology Code available for payment source | Attending: Emergency Medicine | Admitting: Emergency Medicine

## 2013-02-10 ENCOUNTER — Emergency Department (HOSPITAL_COMMUNITY): Payer: No Typology Code available for payment source

## 2013-02-10 DIAGNOSIS — T148XXA Other injury of unspecified body region, initial encounter: Secondary | ICD-10-CM

## 2013-02-10 DIAGNOSIS — M545 Low back pain, unspecified: Secondary | ICD-10-CM

## 2013-02-10 DIAGNOSIS — Y92410 Unspecified street and highway as the place of occurrence of the external cause: Secondary | ICD-10-CM | POA: Insufficient documentation

## 2013-02-10 DIAGNOSIS — S335XXA Sprain of ligaments of lumbar spine, initial encounter: Secondary | ICD-10-CM | POA: Insufficient documentation

## 2013-02-10 DIAGNOSIS — M542 Cervicalgia: Secondary | ICD-10-CM | POA: Insufficient documentation

## 2013-02-10 DIAGNOSIS — Y9389 Activity, other specified: Secondary | ICD-10-CM | POA: Insufficient documentation
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
## 2013-02-10 MED ORDER — HYDROCODONE-ACETAMINOPHEN 5-325 MG PO TABS: 1.0000 | ORAL_TABLET | ORAL | Status: DC | PRN

## 2013-02-10 MED ORDER — CYCLOBENZAPRINE HCL 10 MG PO TABS: 10.0000 mg | ORAL_TABLET | Freq: Two times a day (BID) | ORAL | Status: DC | PRN

## 2013-02-10 MED ORDER — CYCLOBENZAPRINE HCL 10 MG PO TABS
5.0000 mg | ORAL_TABLET | Freq: Once | ORAL | Status: AC
  Administered 2013-02-10: 5 mg via ORAL
  Filled 2013-02-10: qty 1

## 2013-02-10 MED ORDER — HYDROCODONE-ACETAMINOPHEN 5-325 MG PO TABS
1.0000 | ORAL_TABLET | Freq: Once | ORAL | Status: AC
  Administered 2013-02-10: 1 via ORAL
  Filled 2013-02-10: qty 1
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
## 2013-02-10 NOTE — ED Provider Notes (Signed)
Medical screening examination/treatment/procedure(s) were performed by non-physician practitioner and as supervising physician I was immediately available for consultation/collaboration.   Charles B. Bernette Mayers, MD 02/10/13 [phone]

## 2013-02-19 ENCOUNTER — Ambulatory Visit (HOSPITAL_COMMUNITY)
Admission: RE | Admit: 2013-02-19 | Discharge: 2013-02-19 | Disposition: A | Discharge status: Home / Self Care | Payer: No Typology Code available for payment source | Source: Ambulatory Visit | Attending: Chiropractic Medicine | Admitting: Chiropractic Medicine

## 2013-02-19 ENCOUNTER — Other Ambulatory Visit (HOSPITAL_COMMUNITY): Payer: Self-pay | Admitting: Chiropractic Medicine

## 2013-02-19 DIAGNOSIS — M542 Cervicalgia: Secondary | ICD-10-CM
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
## 2013-03-21 ENCOUNTER — Other Ambulatory Visit (HOSPITAL_COMMUNITY): Payer: Self-pay | Admitting: Family Medicine

## 2013-03-21 ENCOUNTER — Ambulatory Visit (HOSPITAL_COMMUNITY)
Admission: RE | Admit: 2013-03-21 | Discharge: 2013-03-21 | Disposition: A | Discharge status: Home / Self Care | Payer: No Typology Code available for payment source | Source: Ambulatory Visit | Attending: Family Medicine | Admitting: Family Medicine

## 2013-03-21 DIAGNOSIS — M25532 Pain in left wrist: Secondary | ICD-10-CM

## 2013-03-21 DIAGNOSIS — M25539 Pain in unspecified wrist: Secondary | ICD-10-CM | POA: Insufficient documentation

## 2013-03-21 IMAGING — CR DG WRIST COMPLETE 3+V*L*
4 series · 4 of 4 positions shown · non-contrast
Comparison: None.

CLINICAL DATA: Left posterior rib pain stress that left posterior
wrist pain

EXAM:
LEFT WRIST - COMPLETE 3+ VIEW

[x wrist pa left]
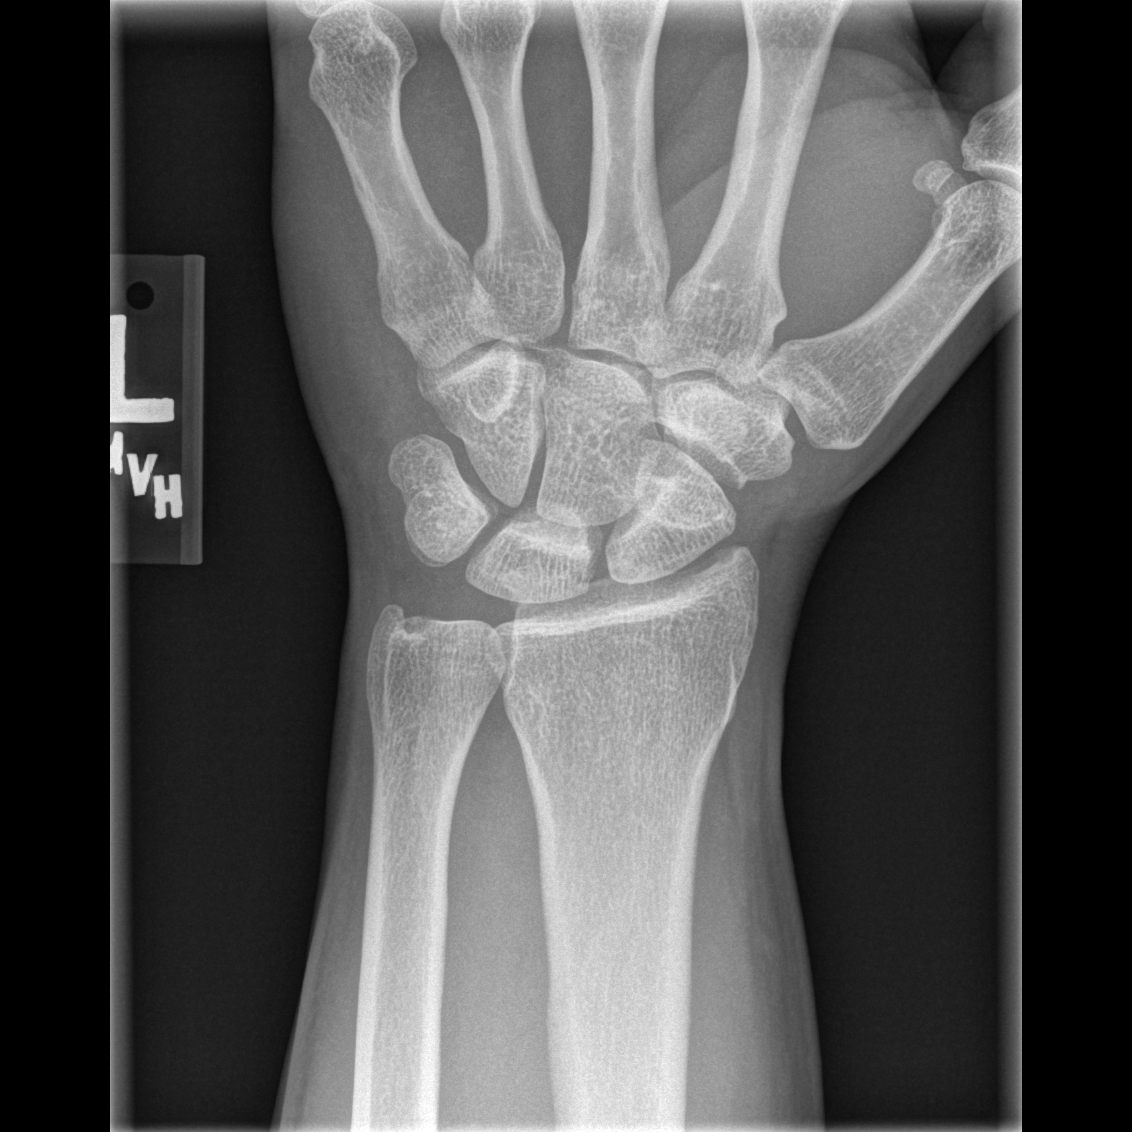

[x wrist obl left]
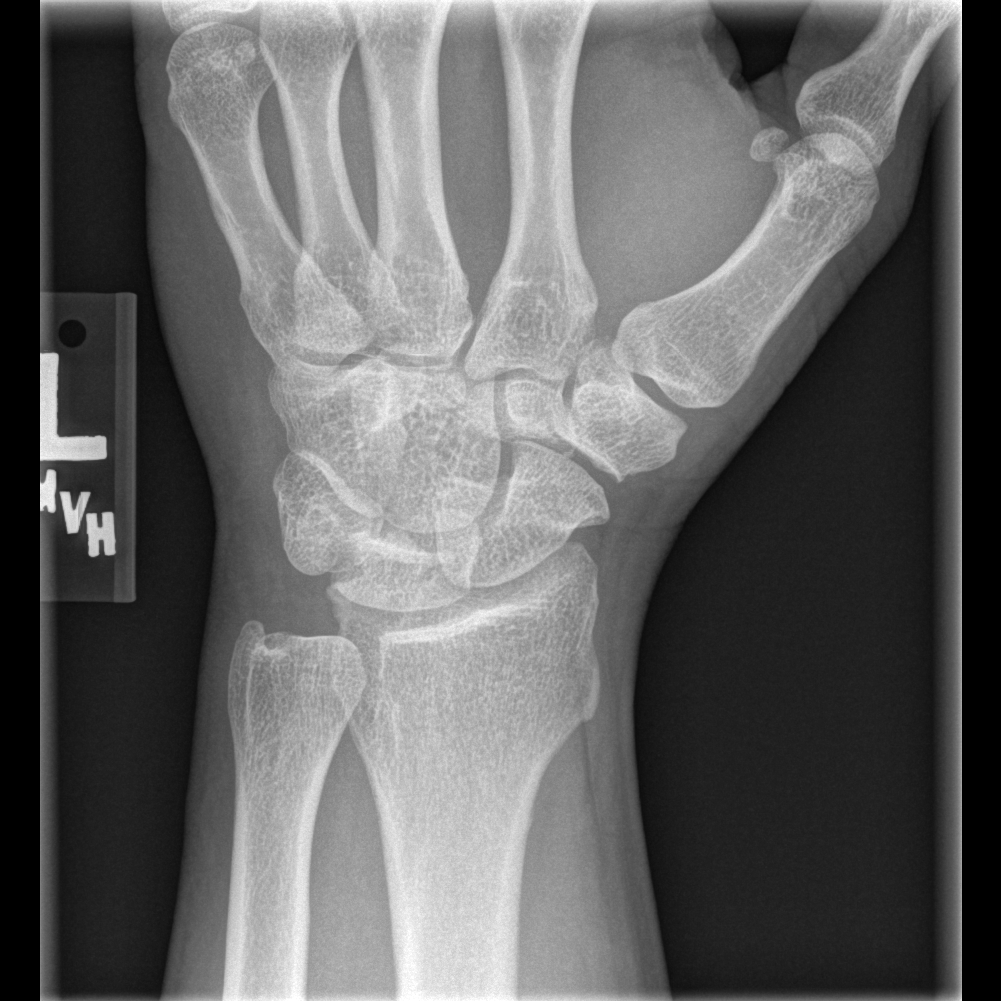

[x wrist lat left]
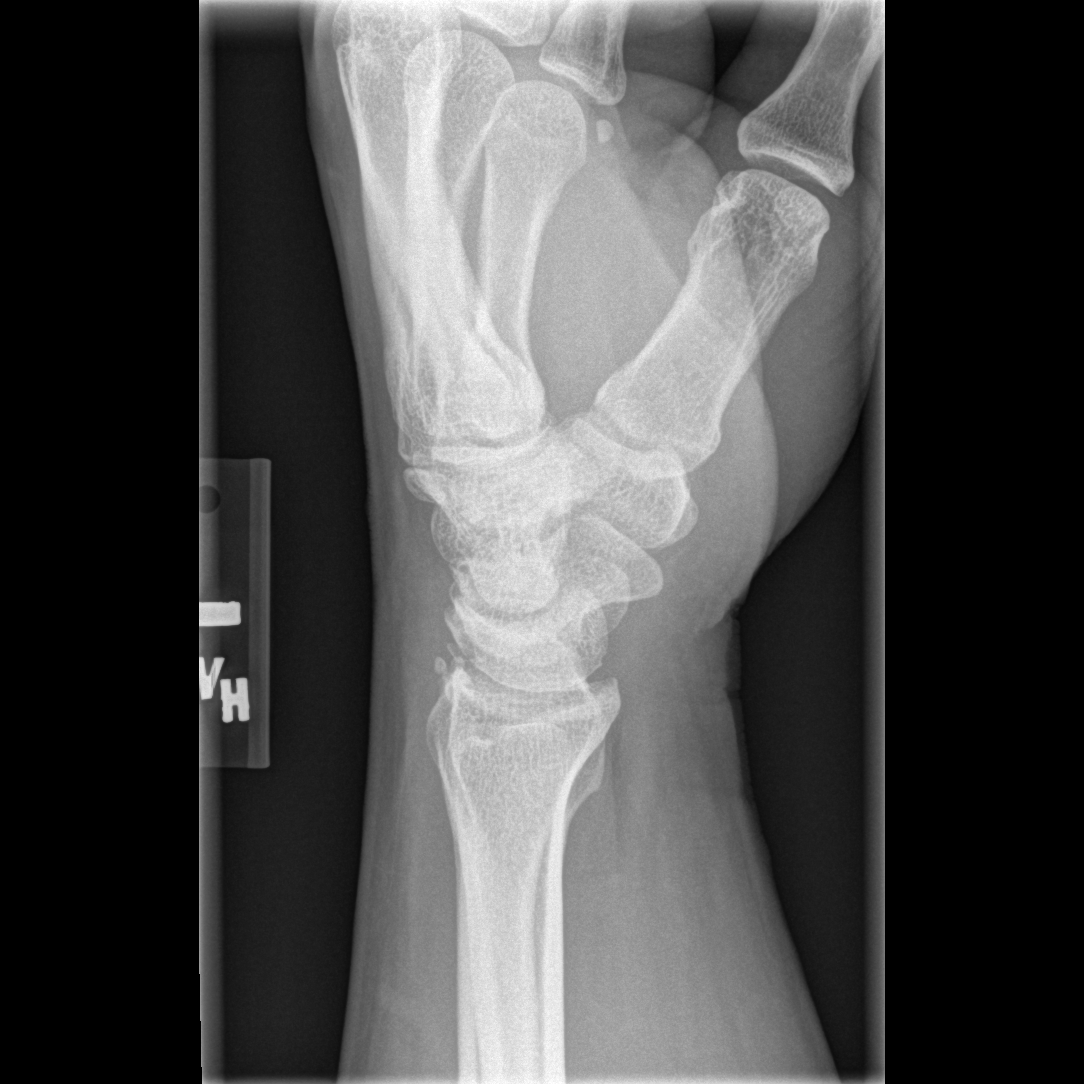

[x wrist navicular]
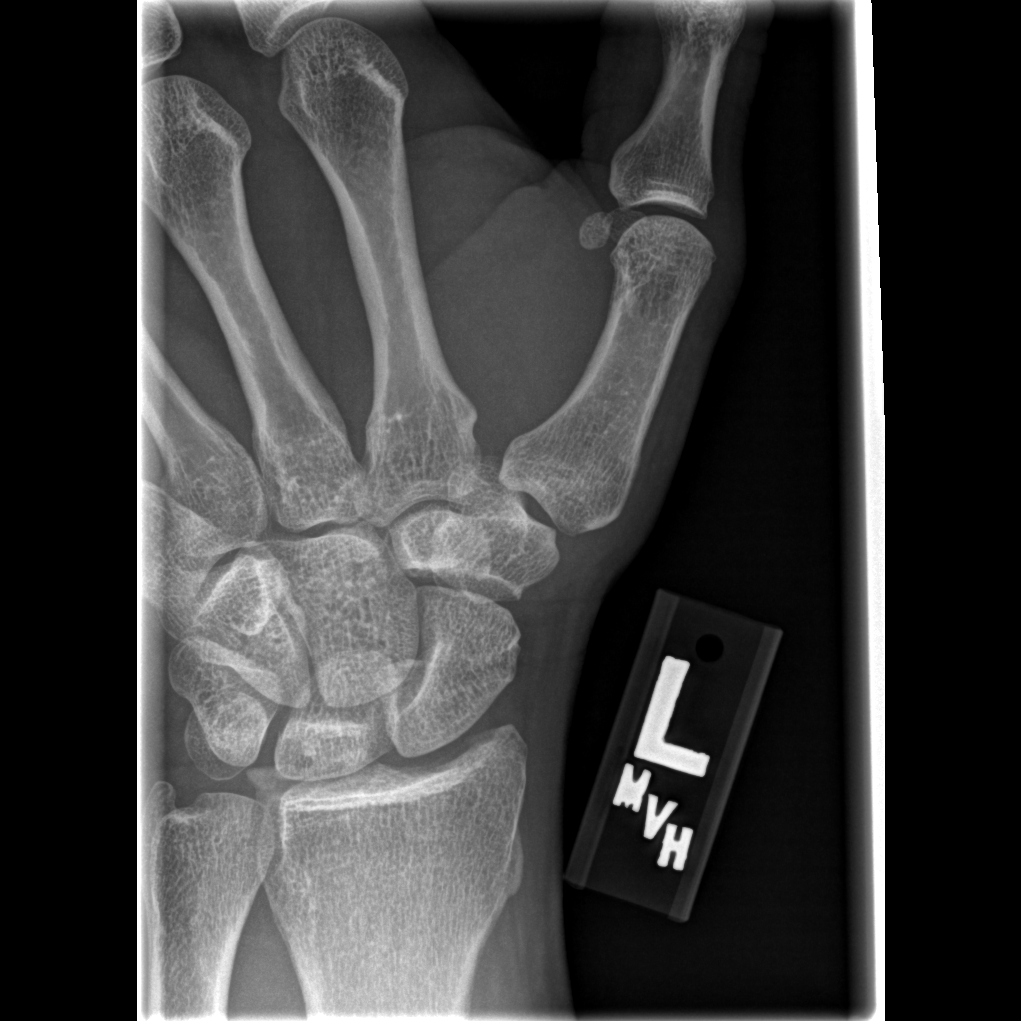

[4 of 4 positions shown; findings below may reference images not displayed]

FINDINGS: Radiocarpal joint is intact. The distal radius or ulnar fracture. On
the lateral projection there is a small ossific fragment projecting
just dorsal to the radiocarpal joint. This could potentially
represent a triquetrum fracture although it is more proximal than
typically seen. No scaphoid fracture.
IMPRESSION: Potential small triquetrum fracture seen on the lateral projection.

## 2013-10-23 ENCOUNTER — Emergency Department (HOSPITAL_BASED_OUTPATIENT_CLINIC_OR_DEPARTMENT_OTHER): Payer: Self-pay

## 2013-10-23 ENCOUNTER — Emergency Department (HOSPITAL_BASED_OUTPATIENT_CLINIC_OR_DEPARTMENT_OTHER)
Admission: EM | Admit: 2013-10-23 | Discharge: 2013-10-24 | Disposition: A | Discharge status: Home / Self Care | Payer: Self-pay | Attending: Emergency Medicine | Admitting: Emergency Medicine

## 2013-10-23 ENCOUNTER — Encounter (HOSPITAL_BASED_OUTPATIENT_CLINIC_OR_DEPARTMENT_OTHER): Payer: Self-pay | Admitting: Emergency Medicine

## 2013-10-23 DIAGNOSIS — R059 Cough, unspecified: Secondary | ICD-10-CM | POA: Insufficient documentation

## 2013-10-23 DIAGNOSIS — R05 Cough: Secondary | ICD-10-CM

## 2013-10-23 DIAGNOSIS — R0602 Shortness of breath: Secondary | ICD-10-CM | POA: Insufficient documentation

## 2013-10-23 DIAGNOSIS — Z79899 Other long term (current) drug therapy: Secondary | ICD-10-CM | POA: Insufficient documentation

## 2013-10-23 DIAGNOSIS — E785 Hyperlipidemia, unspecified: Secondary | ICD-10-CM | POA: Insufficient documentation

## 2013-10-23 DIAGNOSIS — Z8669 Personal history of other diseases of the nervous system and sense organs: Secondary | ICD-10-CM | POA: Insufficient documentation

## 2013-10-23 IMAGING — CR DG CHEST 2V
2 series · 2 of 2 positions shown · non-contrast
Comparison: None.

CLINICAL DATA: Cough.

EXAM:
CHEST  2 VIEW

[w chest pa]
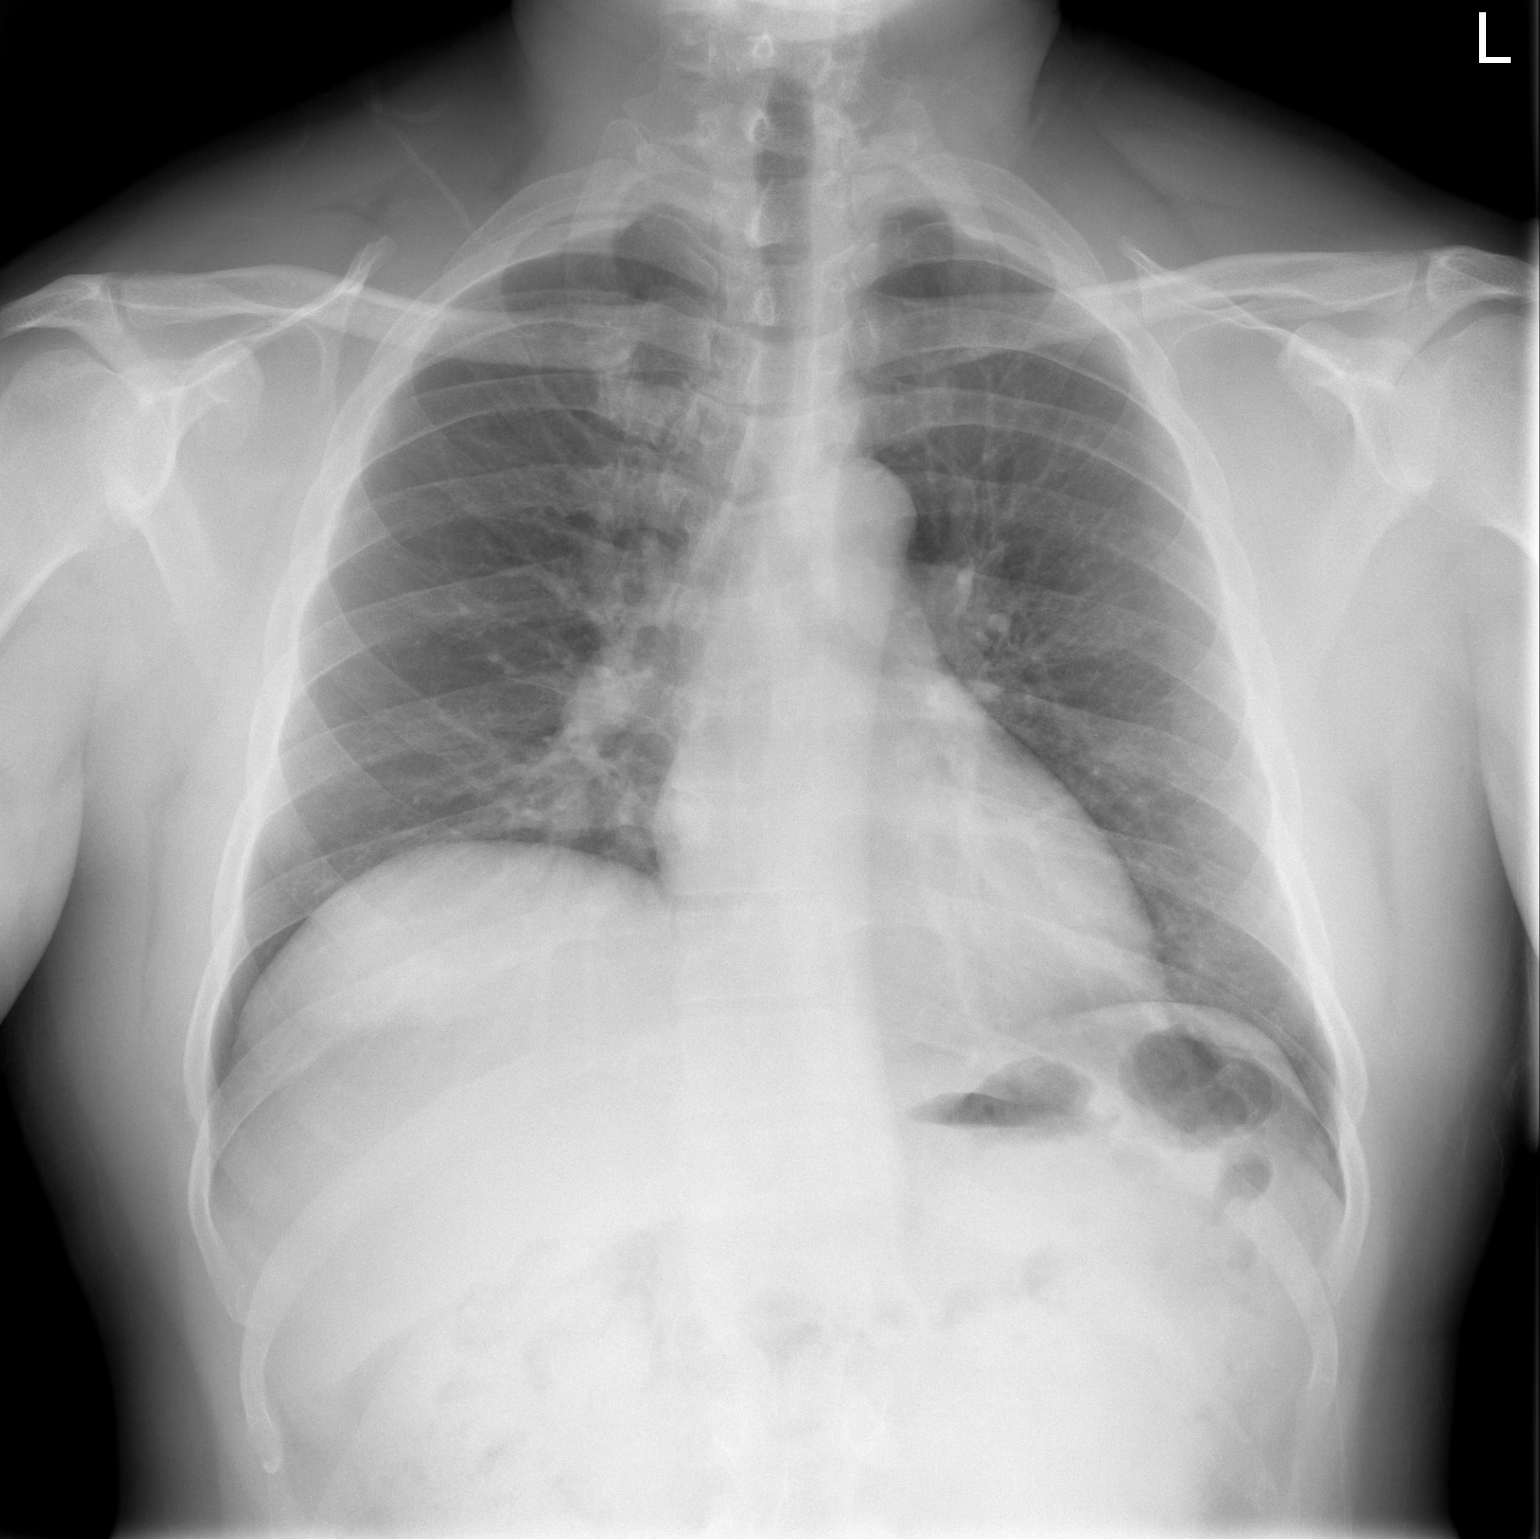

[w chest lat]
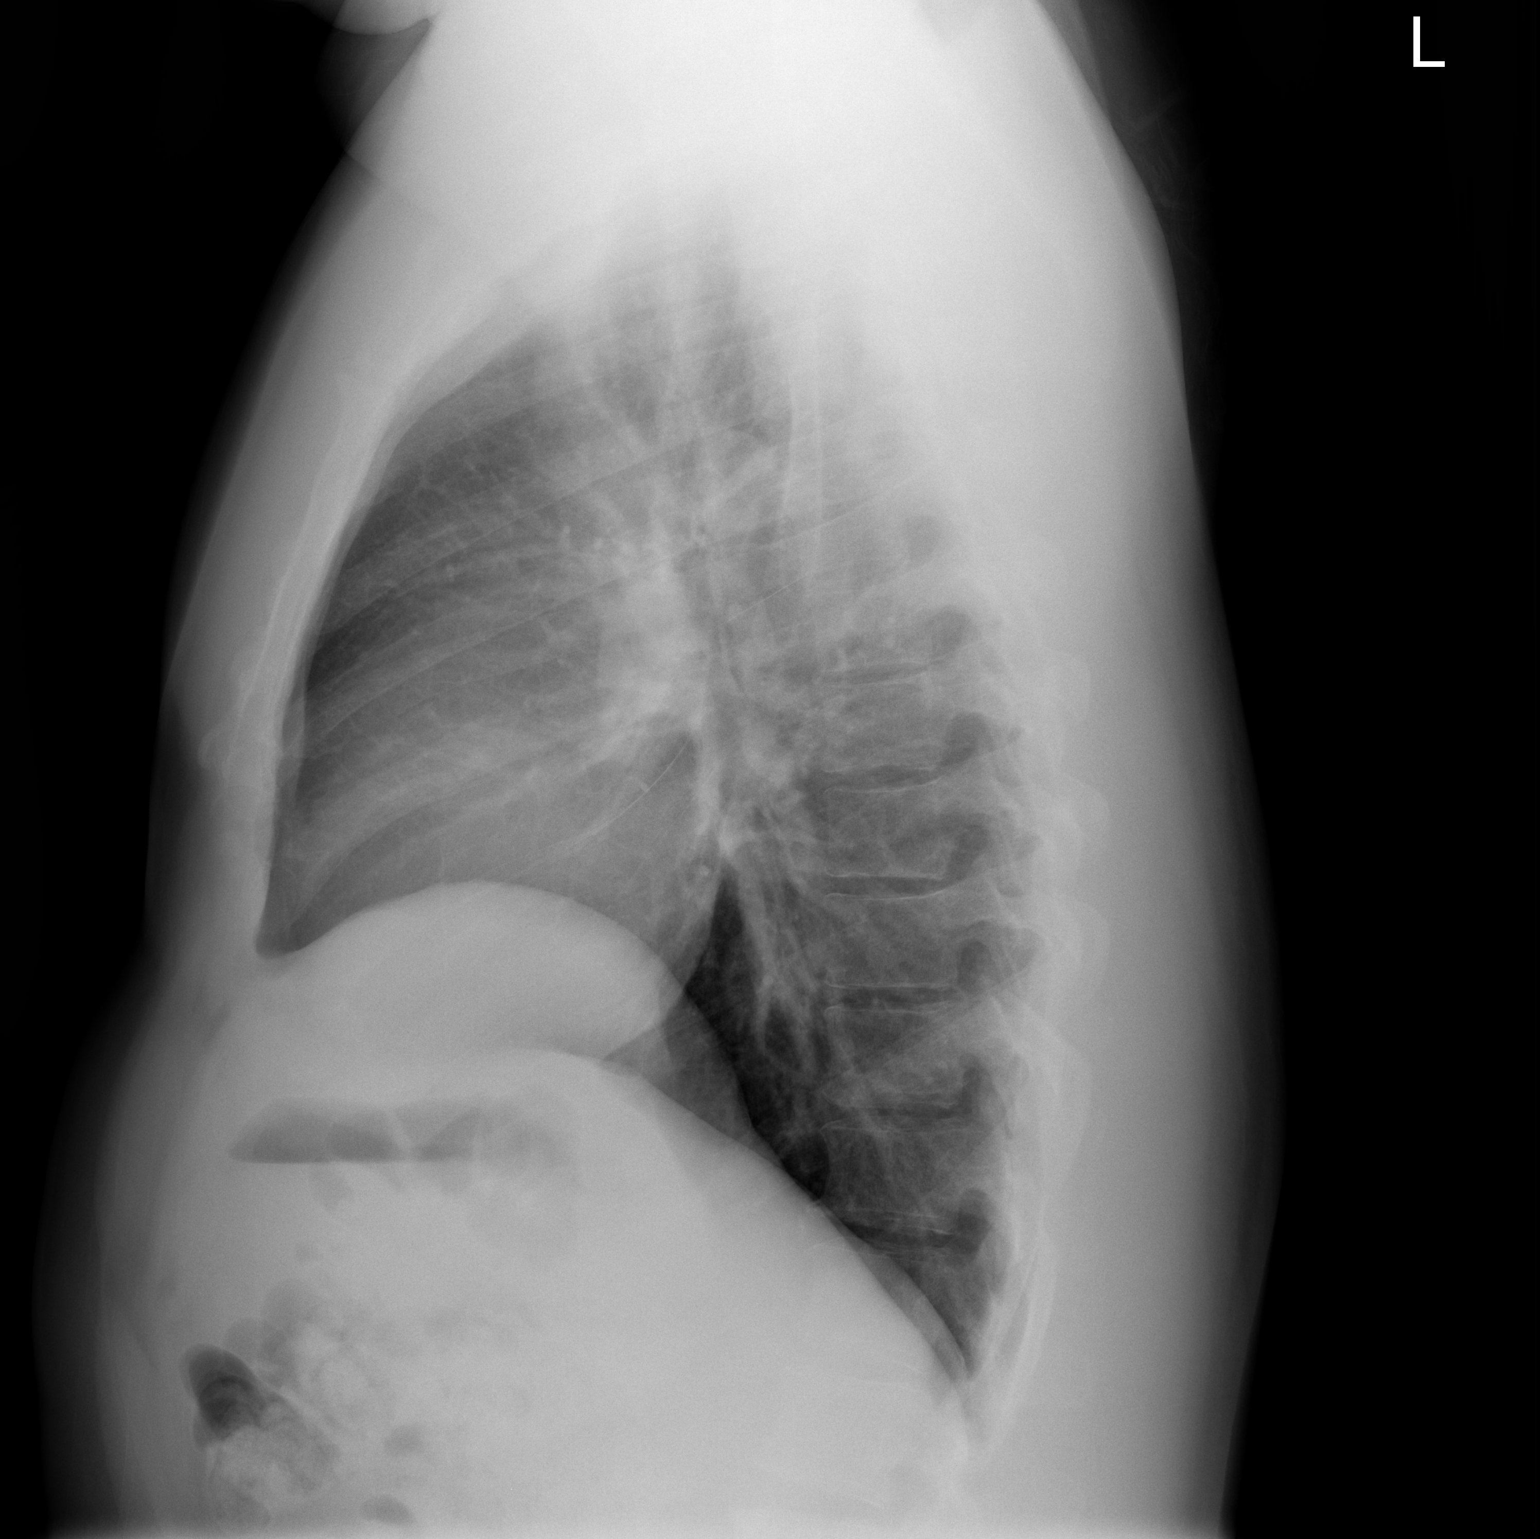

[2 of 2 positions shown; findings below may reference images not displayed]

FINDINGS: Normal heart, mediastinum and hila. Mild eventration of the anterior
right hemidiaphragm. Lungs are clear. No pleural effusion or
pneumothorax. Bony thorax is unremarkable.
IMPRESSION: No active cardiopulmonary disease.

## 2013-10-23 MED ORDER — BENZONATATE 100 MG PO CAPS
100.0000 mg | ORAL_CAPSULE | Freq: Once | ORAL | Status: AC
  Administered 2013-10-23: 100 mg via ORAL
  Filled 2013-10-23: qty 1

## 2013-10-23 NOTE — ED Provider Notes (Signed)
CSN: [id number]     Arrival date & time 10/23/13  2259 History  This chart was scribed for Shon Baton, MD, by Yevette Edwards, ED Scribe. This patient was seen in room MH04/MH04 and the patient's care was started at 11:28 PM.   First MD Initiated Contact with Patient 10/23/13 2328     Chief Complaint  Patient presents with  . Cough     The history is provided by the patient. No language interpreter was used.   HPI Comments: Michael Fleming is a 43 y.o. male, with hyperlipidemia, who presents to the Emergency Department complaining of a cough, productive of yellow phlegm, which has persisted for two weeks. He denies an increase of coughing at night. The pt also endorses SOB, increased with ambulation, and rhinorrhea.  Michael Fleming has used Tussin andTylenol Cold and Sinus without resolution. He denies a fever, chills, and chest pain. He has been exposed to sick contacts. The pt is a non-smoker, but he is exposed to smoke passively.    Past Medical History  Diagnosis Date  . Seizures    Past Surgical History  Procedure Laterality Date  . Brain surgery      1981 blot clot removed  . Foot surgery      right bunion removed   History reviewed. No pertinent family history. History  Substance Use Topics  . Smoking status: Never Smoker   . Smokeless tobacco: Never Used  . Alcohol Use: Yes     Comment: occasional    Review of Systems  Constitutional: Negative.  Negative for fever.  Respiratory: Positive for cough and shortness of breath. Negative for chest tightness.   Cardiovascular: Negative.  Negative for chest pain and leg swelling.  Gastrointestinal: Negative.  Negative for abdominal pain.  Genitourinary: Negative.  Negative for dysuria.  Musculoskeletal: Negative for back pain.  Skin: Negative for rash.  Neurological: Negative for headaches.  All other systems reviewed and are negative.     Allergies  Review of patient's allergies indicates no known allergies.  Home  Medications   Prior to Admission medications   Medication Sig Start Date End Date Taking? Authorizing Provider  benzonatate (TESSALON) 100 MG capsule Take 1 capsule (100 mg total) by mouth 3 (three) times daily as needed for cough. 10/24/13   Shon Baton, MD   BP 159/93  Pulse 78  Temp(Src) 98.5 F (36.9 C) (Oral)  Resp 14  Ht 5\' 7"  (1.702 m)  Wt 203 lb (92.08 kg)  BMI 31.79 kg/m2  SpO2 100%  Physical Exam  Nursing note and vitals reviewed. Constitutional: He is oriented to person, place, and time. He appears well-developed and well-nourished.  HENT:  Head: Normocephalic and atraumatic.  Eyes: Pupils are equal, round, and reactive to light.  Neck: Neck supple.  Cardiovascular: Normal rate, regular rhythm and normal heart sounds.   No murmur heard. Pulmonary/Chest: Effort normal and breath sounds normal. No respiratory distress. He has no wheezes. He has no rales.  Abdominal: Soft. There is no tenderness.  Musculoskeletal: He exhibits no edema.  Lymphadenopathy:    He has no cervical adenopathy.  Neurological: He is alert and oriented to person, place, and time.  Skin: Skin is warm and dry.  Psychiatric: He has a normal mood and affect.    ED Course  Procedures (including critical care time)  DIAGNOSTIC STUDIES: Oxygen Saturation is 100% on room air, normal by my interpretation.    COORDINATION OF CARE:  11:32 PM- Discussed treatment  plan with patient, and the patient agreed to the plan. The plan includes imaging and cough medication.    Labs Review Labs Reviewed - No data to display  Imaging Review Dg Chest 2 View  10/23/2013   CLINICAL DATA:  Cough.  EXAM: CHEST  2 VIEW  COMPARISON:  None.  FINDINGS: Normal heart, mediastinum and hila. Mild eventration of the anterior right hemidiaphragm. Lungs are clear. No pleural effusion or pneumothorax. Bony thorax is unremarkable.  IMPRESSION: No active cardiopulmonary disease.   Electronically Signed   By: Amie Portlandavid  Ormond  M.D.   On: 10/23/2013 23:47     EKG Interpretation None      MDM   Final diagnoses:  Cough    Patient presents with cough. He endorses associated shortness of breath. He is currently without symptoms. He reports sick contacts and additional symptoms of runny nose. No known fevers. Cough is productive. He is nontoxic on exam. Pulmonary exam is unremarkable. No wheezing noted.  For patient's cough is likely related to viral etiology versus secondhand smoke. Chest x-ray is without evidence of pneumonia. He is PERC negative.  He is satting 100% on room air. Patient was given Jerilynn Somessalon Perles.  Patient was referred to [facility] to establish primary care. Patient was given strict return precautions including worsening symptoms, fever, chest pain.  After history, exam, and medical workup I feel the patient has been appropriately medically screened and is safe for discharge home. Pertinent diagnoses were discussed with the patient. Patient was given return precautions.   I personally performed the services described in this documentation, which was scribed in my presence. The recorded information has been reviewed and is accurate.    Shon Batonourtney F Horton, MD 10/24/13 [phone]

## 2013-10-23 NOTE — ED Notes (Signed)
Pt c/o pro cough x 2 weeks

## 2013-10-24 MED ORDER — BENZONATATE 100 MG PO CAPS: 100.0000 mg | ORAL_CAPSULE | Freq: Three times a day (TID) | ORAL | Status: DC | PRN

## 2013-10-24 NOTE — Discharge Instructions (Signed)

## 2014-04-08 ENCOUNTER — Encounter (HOSPITAL_BASED_OUTPATIENT_CLINIC_OR_DEPARTMENT_OTHER): Payer: Self-pay | Admitting: *Deleted

## 2014-04-08 ENCOUNTER — Emergency Department (HOSPITAL_BASED_OUTPATIENT_CLINIC_OR_DEPARTMENT_OTHER)
Admission: EM | Admit: 2014-04-08 | Discharge: 2014-04-08 | Disposition: A | Discharge status: Home / Self Care | Payer: No Typology Code available for payment source | Attending: Emergency Medicine | Admitting: Emergency Medicine

## 2014-04-08 DIAGNOSIS — L02213 Cutaneous abscess of chest wall: Secondary | ICD-10-CM

## 2014-04-08 MED ORDER — LIDOCAINE HCL (PF) 1 % IJ SOLN
5.0000 mL | Freq: Once | INTRAMUSCULAR | Status: DC
  Filled 2014-04-08: qty 5

## 2014-04-08 MED ORDER — SULFAMETHOXAZOLE-TRIMETHOPRIM 800-160 MG PO TABS: 1.0000 | ORAL_TABLET | Freq: Two times a day (BID) | ORAL | Status: DC

## 2014-04-08 MED ORDER — NAPROXEN 500 MG PO TABS: 500.0000 mg | ORAL_TABLET | Freq: Two times a day (BID) | ORAL | Status: DC

## 2014-04-08 NOTE — ED Notes (Signed)
Lump right chest since beginning of the year- painful x 1 week

## 2014-04-08 NOTE — Discharge Instructions (Signed)

## 2014-04-08 NOTE — ED Provider Notes (Signed)
CSN: [id number]     Arrival date & time 04/08/14  1454 History   First MD Initiated Contact with Patient 04/08/14 1517     Chief Complaint  Patient presents with  . Abscess     (Consider location/radiation/quality/duration/timing/severity/associated sxs/prior Treatment) Patient is a 43 y.o. male presenting with abscess. The history is provided by the patient.  Abscess Location:  Torso Torso abscess location:  R chest Abscess quality: fluctuance, painful, redness and warmth   Red streaking: no   Duration:  1 week Progression:  Worsening Pain details:    Quality:  Aching   Severity:  Moderate   Timing:  Constant   Progression:  Worsening Chronicity:  New Relieved by:  None tried Worsened by:  Nothing tried Ineffective treatments:  None tried  Michael Fleming is a 43 y.o. male who presents to the ED with a raised red area to the right chest wall. He states that he noticed the slightly raised area several months ago but over the past week the area has gotten larger and is now painful.  Past Medical History  Diagnosis Date  . Seizures     last seizure age 43   Past Surgical History  Procedure Laterality Date  . Brain surgery      1981 blot clot removed  . Foot surgery      right bunion removed   No family history on file. History  Substance Use Topics  . Smoking status: Never Smoker   . Smokeless tobacco: Never Used  . Alcohol Use: Yes     Comment: occasional    Review of Systems Negative except as stated in HPI   Allergies  Review of patient's allergies indicates no known allergies.  Home Medications   Prior to Admission medications   Medication Sig Start Date End Date Taking? Authorizing Provider  benzonatate (TESSALON) 100 MG capsule Take 1 capsule (100 mg total) by mouth 3 (three) times daily as needed for cough. 10/24/13   Shon Batonourtney F Horton, MD   BP 149/93 mmHg  Pulse 78  Temp(Src) 98.6 F (37 C) (Oral)  Resp 18  Ht 5\' 7"  (1.702 m)  Wt 199 lb (90.266  kg)  BMI 31.16 kg/m2  SpO2 97% Physical Exam  Constitutional: He is oriented to person, place, and time. He appears well-developed and well-nourished.  Eyes: Conjunctivae and EOM are normal.  Neck: Neck supple.  Cardiovascular: Normal rate.   Pulmonary/Chest: Effort normal. Right breast exhibits mass and tenderness.    Raised, tender area right chest wall, mild erythema.   Musculoskeletal: Normal range of motion.  Neurological: He is alert and oriented to person, place, and time. No cranial nerve deficit.  Skin: Skin is warm and dry.  Psychiatric: He has a normal mood and affect. His behavior is normal.  Nursing note and vitals reviewed.   ED Course  Procedures  INCISION AND DRAINAGE Performed by: NEESE,HOPE Consent: Verbal consent obtained. Risks and benefits: risks, benefits and alternatives were discussed Type: abscess  Body area: right chest wall  Cleaned with betadine  Draped   Anesthesia: local infiltration  Local anesthetic: lidocaine 1% without epinephrine  Anesthetic total: 2 ml  Incision: Single Straight, # 11 blade  Complexity: complex Blunt dissection to break up loculations  Drainage: purulent followed by cheesy thick substance  Drainage amount: small  Packing material: 1/4 in iodoform gauze  Patient tolerance: Patient tolerated the procedure well with no immediate complications.  Dressing applied.  MDM  43 y.o. male with  tender raised area right chest wall. Stable for discharge without fever or signs of sepsis. He will apply warm wet compresses to the area and return in 2 days for packing removal and recheck. Discussed with the patient and all questioned fully answered. He will return sooner if any problems arise.    Medication List    TAKE these medications        naproxen 500 MG tablet  Commonly known as:  NAPROSYN  Take 1 tablet (500 mg total) by mouth 2 (two) times daily.     sulfamethoxazole-trimethoprim 800-160 MG per tablet    Commonly known as:  SEPTRA DS  Take 1 tablet by mouth every 12 (twelve) hours.      ASK your doctor about these medications        benzonatate 100 MG capsule  Commonly known as:  TESSALON  Take 1 capsule (100 mg total) by mouth 3 (three) times daily as needed for cough.           CottonwoodHope M Neese, [place]NP 04/08/14 2204  Mirian MoMatthew Gentry, MD 04/09/14 [phone]

## 2014-04-10 ENCOUNTER — Encounter (HOSPITAL_BASED_OUTPATIENT_CLINIC_OR_DEPARTMENT_OTHER): Payer: Self-pay | Admitting: *Deleted

## 2014-04-10 ENCOUNTER — Emergency Department (HOSPITAL_BASED_OUTPATIENT_CLINIC_OR_DEPARTMENT_OTHER)
Admission: EM | Admit: 2014-04-10 | Discharge: 2014-04-10 | Disposition: A | Discharge status: Home / Self Care | Payer: No Typology Code available for payment source | Attending: Emergency Medicine | Admitting: Emergency Medicine

## 2014-04-10 DIAGNOSIS — Z4801 Encounter for change or removal of surgical wound dressing: Secondary | ICD-10-CM | POA: Insufficient documentation

## 2014-04-10 DIAGNOSIS — Z791 Long term (current) use of non-steroidal anti-inflammatories (NSAID): Secondary | ICD-10-CM | POA: Insufficient documentation

## 2014-04-10 DIAGNOSIS — Z5189 Encounter for other specified aftercare: Secondary | ICD-10-CM

## 2014-04-10 NOTE — ED Notes (Signed)
D/c home- wound care expectations discussed

## 2014-04-10 NOTE — ED Provider Notes (Signed)
CSN: [id number]     Arrival date & time 04/10/14  1915 History   First MD Initiated Contact with Patient 04/10/14 1959     Chief Complaint  Patient presents with  . Wound Check     (Consider location/radiation/quality/duration/timing/severity/associated sxs/prior Treatment) HPI Comments: This is a 43 year old male who presents to the emergency department for a wound check of an abscess that was drained 2 days ago. Patient reports he's had no problems with the area, no drainage or fevers. He reports some mild pain, however improved from prior to drainage. He is taking the antibiotics as prescribed.  Patient is a 43 y.o. male presenting with wound check. The history is provided by the patient.  Wound Check Pertinent negatives include no fever or numbness.    Past Medical History  Diagnosis Date  . Seizures     last seizure age 43   Past Surgical History  Procedure Laterality Date  . Brain surgery      1981 blot clot removed  . Foot surgery      right bunion removed   No family history on file. History  Substance Use Topics  . Smoking status: Never Smoker   . Smokeless tobacco: Never Used  . Alcohol Use: Yes     Comment: occasional    Review of Systems  Constitutional: Negative for fever.  HENT: Negative.   Respiratory: Negative.   Cardiovascular: Negative.   Gastrointestinal: Negative.   Skin:       + Abscess.  Neurological: Negative for numbness.      Allergies  Review of patient's allergies indicates no known allergies.  Home Medications   Prior to Admission medications   Medication Sig Start Date End Date Taking? Authorizing Provider  naproxen (NAPROSYN) 500 MG tablet Take 1 tablet (500 mg total) by mouth 2 (two) times daily. 04/08/14  Yes Hope Orlene OchM Neese, NP  sulfamethoxazole-trimethoprim (SEPTRA DS) 800-160 MG per tablet Take 1 tablet by mouth every 12 (twelve) hours. 04/08/14  Yes Hope Orlene OchM Neese, NP  benzonatate (TESSALON) 100 MG capsule Take 1 capsule (100  mg total) by mouth 3 (three) times daily as needed for cough. 10/24/13   Shon Batonourtney F Horton, MD   BP 140/84 mmHg  Pulse 76  Temp(Src) 98.7 F (37.1 C) (Oral)  Resp 16  Ht 5\' 7"  (1.702 m)  Wt 199 lb (90.266 kg)  BMI 31.16 kg/m2  SpO2 97% Physical Exam  Constitutional: He is oriented to person, place, and time. He appears well-developed and well-nourished. No distress.  HENT:  Head: Normocephalic and atraumatic.  Eyes: Conjunctivae and EOM are normal.  Neck: Normal range of motion. Neck supple.  Cardiovascular: Normal rate, regular rhythm and normal heart sounds.   Pulmonary/Chest: Effort normal and breath sounds normal.    Musculoskeletal: Normal range of motion. He exhibits no edema.  Neurological: He is alert and oriented to person, place, and time.  Skin: Skin is warm and dry.  Psychiatric: He has a normal mood and affect. His behavior is normal.  Nursing note and vitals reviewed.   ED Course  Procedures (including critical care time) Labs Review Labs Reviewed - No data to display  Imaging Review No results found.   EKG Interpretation None      MDM   Final diagnoses:  Wound check, abscess   Pt with well healing abscess. AFVSS. NAD. Abscess appears to be healing well. No further drainage necessary at this time. Advised warm compresses. Stable for d/c. Return precautions given. Patient  states understanding of treatment care plan and is agreeable.  Kathrynn SpeedRobyn M Ysabela Keisler, PA-C 04/10/14 [phone]  Toy CookeyMegan Docherty, MD 04/11/14 [phone]

## 2014-04-10 NOTE — Discharge Instructions (Signed)
Wound Check  Your wound appears healthy today. Your wound will heal gradually over time. Eventually a scar will form that will fade with time.  FACTORS THAT AFFECT SCAR FORMATION:   People differ in the severity in which they scar.   Scar severity varies according to location, size, and the traits you inherited from your parents (genetic predisposition).   Irritation to the wound from infection, rubbing, or chemical exposure will increase the amount of scar formation.  HOME CARE INSTRUCTIONS    If you were given a dressing, you should change it at least once a day or as instructed by your caregiver. If the bandage sticks, soak it off with a solution of hydrogen peroxide.   If the bandage becomes wet, dirty, or develops a bad smell, change it as soon as possible.   Look for signs of infection.   Only take over-the-counter or prescription medicines for pain, discomfort, or fever as directed by your caregiver.  SEEK IMMEDIATE MEDICAL CARE IF:    You have redness, swelling, or increasing pain in the wound.   You notice pus coming from the wound.   You have a fever.   You notice a bad smell coming from the wound or dressing.  Document Released: 02/13/2004 Document Revised: 08/01/2011 Document Reviewed: 05/09/2005  ExitCare Patient Information 2015 [employer]. This information is not intended to replace advice given to you by your health care provider. Make sure you discuss any questions you have with your health care provider.    Abscess  An abscess is an infected area that contains a collection of pus and debris.It can occur in almost any part of the body. An abscess is also known as a furuncle or boil.  CAUSES   An abscess occurs when tissue gets infected. This can occur from blockage of oil or sweat glands, infection of hair follicles, or a minor injury to the skin. As the body tries to fight the infection, pus collects in the area and creates pressure under the skin. This pressure causes pain.  People with weakened immune systems have difficulty fighting infections and get certain abscesses more often.   SYMPTOMS  Usually an abscess develops on the skin and becomes a painful mass that is red, warm, and tender. If the abscess forms under the skin, you may feel a moveable soft area under the skin. Some abscesses break open (rupture) on their own, but most will continue to get worse without care. The infection can spread deeper into the body and eventually into the bloodstream, causing you to feel ill.   DIAGNOSIS   Your caregiver will take your medical history and perform a physical exam. A sample of fluid may also be taken from the abscess to determine what is causing your infection.  TREATMENT   Your caregiver may prescribe antibiotic medicines to fight the infection. However, taking antibiotics alone usually does not cure an abscess. Your caregiver may need to make a small cut (incision) in the abscess to drain the pus. In some cases, gauze is packed into the abscess to reduce pain and to continue draining the area.  HOME CARE INSTRUCTIONS    Only take over-the-counter or prescription medicines for pain, discomfort, or fever as directed by your caregiver.   If you were prescribed antibiotics, take them as directed. Finish them even if you start to feel better.   If gauze is used, follow your caregiver's directions for changing the gauze.   To avoid spreading the infection:     Keep your draining abscess covered with a bandage.   Wash your hands well.   Do not share personal care items, towels, or whirlpools with others.   Avoid skin contact with others.   Keep your skin and clothes clean around the abscess.   Keep all follow-up appointments as directed by your caregiver.  SEEK MEDICAL CARE IF:    You have increased pain, swelling, redness, fluid drainage, or bleeding.   You have muscle aches, chills, or a general ill feeling.   You have a fever.  MAKE SURE YOU:    Understand these  instructions.   Will watch your condition.   Will get help right away if you are not doing well or get worse.  Document Released: 02/16/2005 Document Revised: 11/08/2011 Document Reviewed: 07/22/2011  ExitCare Patient Information 2015 [employer]. This information is not intended to replace advice given to you by your health care provider. Make sure you discuss any questions you have with your health care provider.

## 2014-04-10 NOTE — ED Notes (Signed)
I&D abscess right chest- here for recheck

## 2014-09-14 ENCOUNTER — Emergency Department (HOSPITAL_BASED_OUTPATIENT_CLINIC_OR_DEPARTMENT_OTHER): Payer: Self-pay

## 2014-09-14 ENCOUNTER — Emergency Department (HOSPITAL_BASED_OUTPATIENT_CLINIC_OR_DEPARTMENT_OTHER)
Admission: EM | Admit: 2014-09-14 | Discharge: 2014-09-14 | Disposition: A | Discharge status: Home / Self Care | Payer: Self-pay | Attending: Emergency Medicine | Admitting: Emergency Medicine

## 2014-09-14 ENCOUNTER — Encounter (HOSPITAL_BASED_OUTPATIENT_CLINIC_OR_DEPARTMENT_OTHER): Payer: Self-pay | Admitting: *Deleted

## 2014-09-14 DIAGNOSIS — R05 Cough: Secondary | ICD-10-CM

## 2014-09-14 DIAGNOSIS — Z791 Long term (current) use of non-steroidal anti-inflammatories (NSAID): Secondary | ICD-10-CM | POA: Insufficient documentation

## 2014-09-14 DIAGNOSIS — J209 Acute bronchitis, unspecified: Secondary | ICD-10-CM | POA: Insufficient documentation

## 2014-09-14 DIAGNOSIS — J40 Bronchitis, not specified as acute or chronic: Secondary | ICD-10-CM

## 2014-09-14 DIAGNOSIS — R059 Cough, unspecified: Secondary | ICD-10-CM

## 2014-09-14 IMAGING — DX DG CHEST 2V
2 series · 2 of 2 positions shown · non-contrast
Comparison: [DATE]

CLINICAL DATA: Cough

EXAM:
CHEST  2 VIEW

[chest pa]
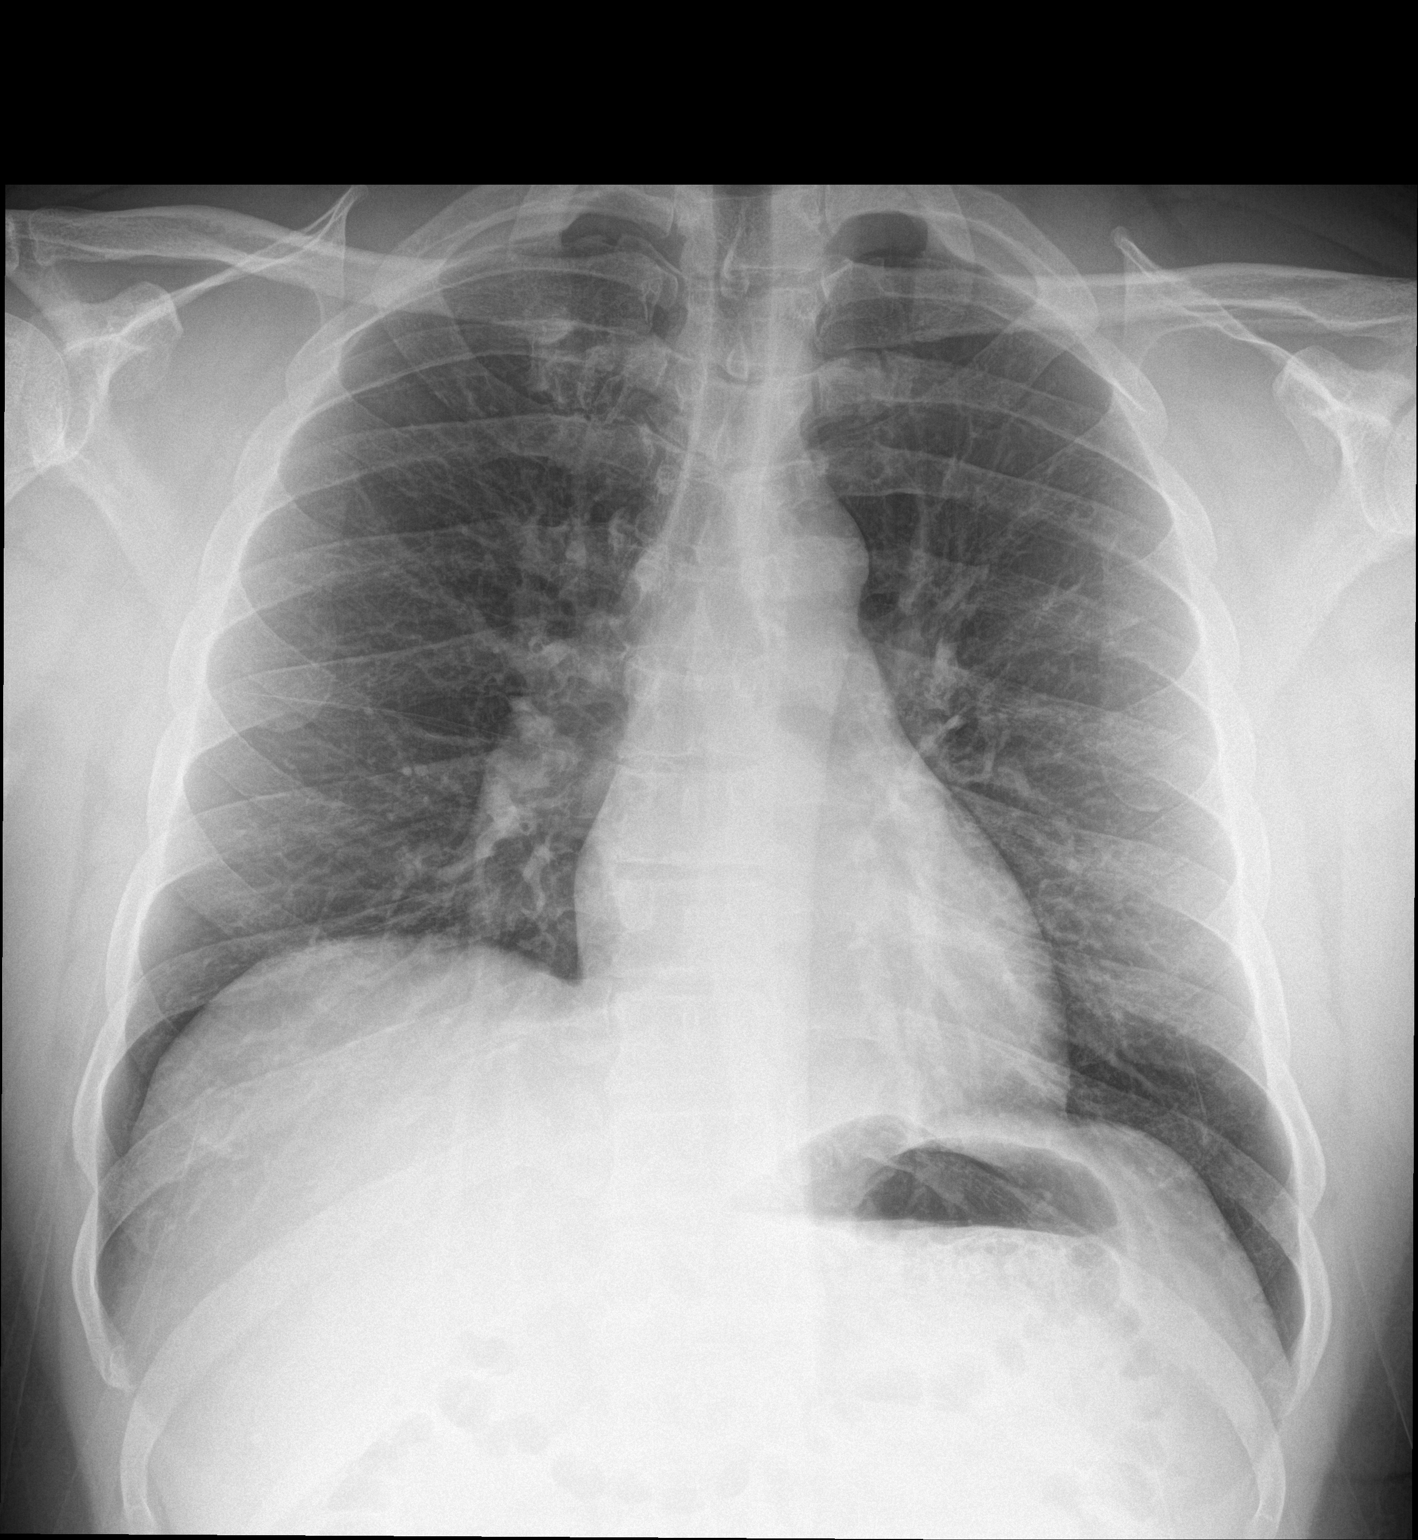

[chest lat]
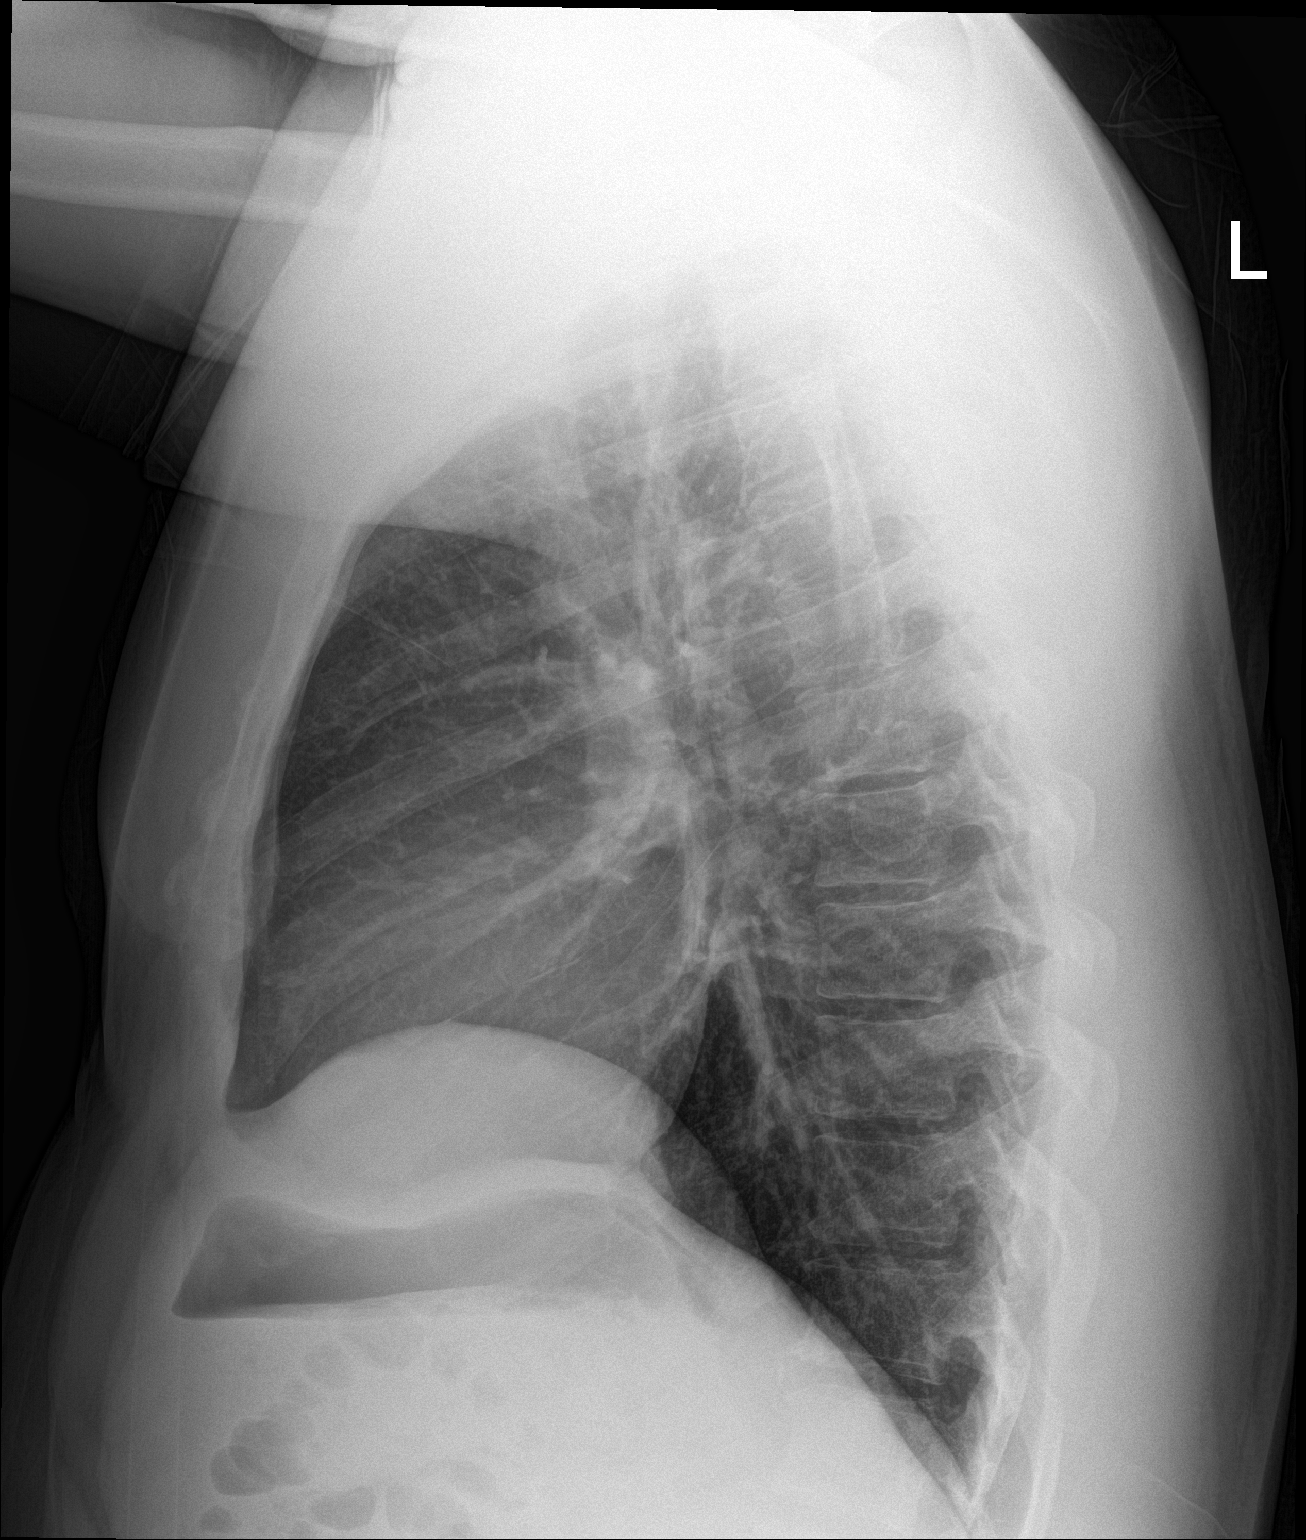

[2 of 2 positions shown; findings below may reference images not displayed]

FINDINGS: The heart size and mediastinal contours are within normal limits.
Both lungs are clear. The visualized skeletal structures are
unremarkable. Apical pleural scarring bilaterally.
IMPRESSION: No active cardiopulmonary disease.

## 2014-09-14 MED ORDER — AZITHROMYCIN 250 MG PO TABS: 250.0000 mg | ORAL_TABLET | Freq: Every day | ORAL | Status: DC

## 2014-09-14 MED ORDER — ALBUTEROL SULFATE HFA 108 (90 BASE) MCG/ACT IN AERS
2.0000 | INHALATION_SPRAY | Freq: Four times a day (QID) | RESPIRATORY_TRACT | Status: DC | PRN
  Administered 2014-09-14: 2 via RESPIRATORY_TRACT
  Filled 2014-09-14: qty 6.7

## 2014-09-14 NOTE — ED Provider Notes (Signed)
CSN: [id number]     Arrival date & time 09/14/14  1717 History   First MD Initiated Contact with Patient 09/14/14 2022     Chief Complaint  Patient presents with  . Cough     (Consider location/radiation/quality/duration/timing/severity/associated sxs/prior Treatment) The history is provided by the patient. No language interpreter was used.  Michael Fleming is a 44 year old male with past medical history of seizures, brain surgery, foot surgery presenting to the ED with productive cough is been ongoing since 09/02/2014. Patient reports that when he coughs there is a yellowish greenish phlegm. Stated that he's been having nasal congestion, mild sore throat secondary to the cough, sinus pressure. Reported that when he coughs his 1 his to his chest. Stated that he's been using over-the-counter Tylenol Cold and flu without relief. Reported that his grandmother is sick with similar symptoms. Denied postnasal drip, chest pain, shortness of breath, difficulty breathing, neck pain, neck stiffness, ear pain or drainage, blurred vision, sudden loss of vision, fainting, traveling, leg swelling, brown pain, nausea, vomiting, diarrhea. Denies smoking cigarettes. PCP none  Past Medical History  Diagnosis Date  . Seizures     last seizure age 44   Past Surgical History  Procedure Laterality Date  . Brain surgery      1981 blot clot removed  . Foot surgery      right bunion removed   No family history on file. History  Substance Use Topics  . Smoking status: Never Smoker   . Smokeless tobacco: Never Used  . Alcohol Use: Yes     Comment: occasional    Review of Systems  Constitutional: Positive for fever (Subjective). Negative for chills.  HENT: Positive for congestion, sinus pressure and sore throat. Negative for ear discharge, ear pain and trouble swallowing.   Eyes: Negative for visual disturbance.  Respiratory: Positive for cough. Negative for chest tightness and shortness of breath.    Cardiovascular: Negative for chest pain.  Gastrointestinal: Negative for nausea, vomiting, abdominal pain and diarrhea.  Neurological: Negative for dizziness, weakness and headaches.      Allergies  Review of patient's allergies indicates no known allergies.  Home Medications   Prior to Admission medications   Medication Sig Start Date End Date Taking? Authorizing Provider  azithromycin (ZITHROMAX) 250 MG tablet Take 1 tablet (250 mg total) by mouth daily. Take first 2 tablets together, then 1 every day until finished. 09/14/14   Leif Loflin, PA-C  benzonatate (TESSALON) 100 MG capsule Take 1 capsule (100 mg total) by mouth 3 (three) times daily as needed for cough. 10/24/13   Shon Batonourtney F Horton, MD  naproxen (NAPROSYN) 500 MG tablet Take 1 tablet (500 mg total) by mouth 2 (two) times daily. 04/08/14   Hope Orlene OchM Neese, NP  sulfamethoxazole-trimethoprim (SEPTRA DS) 800-160 MG per tablet Take 1 tablet by mouth every 12 (twelve) hours. 04/08/14   Hope Orlene OchM Neese, NP   BP 162/97 mmHg  Pulse 96  Temp(Src) 99 F (37.2 C) (Oral)  Resp 18  Ht 5\' 7"  (1.702 m)  Wt 195 lb (88.451 kg)  BMI 30.53 kg/m2  SpO2 95% Physical Exam  Constitutional: He is oriented to person, place, and time. He appears well-developed and well-nourished. No distress.  HENT:  Head: Normocephalic and atraumatic.  Mouth/Throat: Oropharynx is clear and moist. No oropharyngeal exudate.  Eyes: Conjunctivae and EOM are normal. Pupils are equal, round, and reactive to light. Right eye exhibits no discharge. Left eye exhibits no discharge.  Neck:  Normal range of motion. Neck supple. No tracheal deviation present.  Negative neck stiffness Negative nuchal rigidity Negative cervical lymphadenopathy Negative meningeal signs  Cardiovascular: Normal rate, regular rhythm and normal heart sounds.  Exam reveals no friction rub.   No murmur heard. Pulses:      Radial pulses are 2+ on the right side, and 2+ on the left side.   Pulmonary/Chest: Effort normal and breath sounds normal. No respiratory distress. He has no wheezes. He has no rales. He exhibits no tenderness.  Patient is able to speak in full sentences without difficulty Negative use of accessory muscles Negative stridor  Musculoskeletal: Normal range of motion.  Full ROM to upper and lower extremities without difficulty noted, negative ataxia noted.  Lymphadenopathy:    He has no cervical adenopathy.  Neurological: He is alert and oriented to person, place, and time. No cranial nerve deficit. He exhibits normal muscle tone. Coordination normal.  Skin: Skin is warm and dry. No rash noted. He is not diaphoretic. No erythema.  Psychiatric: He has a normal mood and affect. His behavior is normal. Thought content normal.  Nursing note and vitals reviewed.   ED Course  Procedures (including critical care time) Labs Review Labs Reviewed - No data to display  Imaging Review Dg Chest 2 View  09/14/2014   CLINICAL DATA:  Cough  EXAM: CHEST  2 VIEW  COMPARISON:  10/23/2013  FINDINGS: The heart size and mediastinal contours are within normal limits. Both lungs are clear. The visualized skeletal structures are unremarkable. Apical pleural scarring bilaterally.  IMPRESSION: No active cardiopulmonary disease.   Electronically Signed   By: Marlan Palau M.D.   On: 09/14/2014 18:02     EKG Interpretation None      MDM   Final diagnoses:  Bronchitis  Cough    Medications  albuterol (PROVENTIL HFA;VENTOLIN HFA) 108 (90 BASE) MCG/ACT inhaler 2 puff (not administered)    Filed Vitals:   09/14/14 1720  BP: 162/97  Pulse: 96  Temp: 99 F (37.2 C)  TempSrc: Oral  Resp: 18  Height:  (1.702 m)  Weight: 195 lb (88.451 kg)  SpO2: 95%   Chest x-ray no active cord upon a disease noted. Negative findings of pneumonia. Patient presenting to the ED with suspicion of bronchitis. Patient stable, afebrile. Patient not septic appearing. Negative signs of  respiratory distress. Discharged patient. Discharge patient with antibiotics and albuterol inhaler to be used as needed. [facility] Discussed with patient to rest and stay hydrated. Discussed with patient to closely monitor symptoms and if symptoms are to worsen or change to report back to the ED - strict return instructions given.  Patient agreed to plan of care, understood, all questions answered.    Raymon Mutton, PA-C 09/14/14 2158  Gwyneth Sprout, MD 09/17/14 [phone]

## 2014-09-14 NOTE — Discharge Instructions (Signed)
Please call your doctor for a followup appointment within 24-48 hours. When you talk to your doctor please let them know that you were seen in the emergency department and have them acquire all of your records so that they can discuss the findings with you and formulate a treatment plan to fully care for your new and ongoing problems. Please set up appointment with your primary care provider Please rest and stay hydrated Please use albuterol inhaler as needed every 6 hours for shortness of breath and wheezing Please take antibiotics as prescribed Please drink plenty of fluids Please continue to monitor symptoms closely and if symptoms are to worsen or change (fever greater than 101, chills, sweating, nausea, vomiting, chest pain, shortness of breathe, difficulty breathing, weakness, numbness, tingling, worsening or changes to pain pattern, headache, dizziness, neck pain, neck stiffness, coughing up blood, inability keep food fluids down, throat closing sensation) please report back to the Emergency Department immediately.    Acute Bronchitis Bronchitis is inflammation of the airways that extend from the windpipe into the lungs (bronchi). The inflammation often causes mucus to develop. This leads to a cough, which is the most common symptom of bronchitis.  In acute bronchitis, the condition usually develops suddenly and goes away over time, usually in a couple weeks. Smoking, allergies, and asthma can make bronchitis worse. Repeated episodes of bronchitis may cause further lung problems.  CAUSES Acute bronchitis is most often caused by the same virus that causes a cold. The virus can spread from person to person (contagious) through coughing, sneezing, and touching contaminated objects. SIGNS AND SYMPTOMS   Cough.   Fever.   Coughing up mucus.   Body aches.   Chest congestion.   Chills.   Shortness of breath.   Sore throat.  DIAGNOSIS  Acute bronchitis is usually diagnosed  through a physical exam. Your health care provider will also ask you questions about your medical history. Tests, such as chest X-rays, are sometimes done to rule out other conditions.  TREATMENT  Acute bronchitis usually goes away in a couple weeks. Oftentimes, no medical treatment is necessary. Medicines are sometimes given for relief of fever or cough. Antibiotic medicines are usually not needed but may be prescribed in certain situations. In some cases, an inhaler may be recommended to help reduce shortness of breath and control the cough. A cool mist vaporizer may also be used to help thin bronchial secretions and make it easier to clear the chest.  HOME CARE INSTRUCTIONS  Get plenty of rest.   Drink enough fluids to keep your urine clear or pale yellow (unless you have a medical condition that requires fluid restriction). Increasing fluids may help thin your respiratory secretions (sputum) and reduce chest congestion, and it will prevent dehydration.   Take medicines only as directed by your health care provider.  If you were prescribed an antibiotic medicine, finish it all even if you start to feel better.  Avoid smoking and secondhand smoke. Exposure to cigarette smoke or irritating chemicals will make bronchitis worse. If you are a smoker, consider using nicotine gum or skin patches to help control withdrawal symptoms. Quitting smoking will help your lungs heal faster.   Reduce the chances of another bout of acute bronchitis by washing your hands frequently, avoiding people with cold symptoms, and trying not to touch your hands to your mouth, nose, or eyes.   Keep all follow-up visits as directed by your health care provider.  SEEK MEDICAL CARE IF: Your symptoms do  not improve after 1 week of treatment.  SEEK IMMEDIATE MEDICAL CARE IF:  You develop an increased fever or chills.   You have chest pain.   You have severe shortness of breath.  You have bloody sputum.   You  develop dehydration.  You faint or repeatedly feel like you are going to pass out.  You develop repeated vomiting.  You develop a severe headache. MAKE SURE YOU:   Understand these instructions.  Will watch your condition.  Will get help right away if you are not doing well or get worse. Document Released: 06/16/2004 Document Revised: 09/23/2013 Document Reviewed: 10/30/2012 [facility] Patient Information 2015 [employer] [place]. This information is not intended to replace advice given to you by your health care provider. Make sure you discuss any questions you have with your health care provider.   Emergency Department Resource Guide 1) Find a Doctor and Pay Out of Pocket Although you won't have to find out who is covered by your insurance plan, it is a good idea to ask around and get recommendations. You will then need to call the office and see if the doctor you have chosen will accept you as a new patient and what types of options they offer for patients who are self-pay. Some doctors offer discounts or will set up payment plans for their patients who do not have insurance, but you will need to ask so you aren't surprised when you get to your appointment.  2) Contact Your Local Health Department Not all health departments have doctors that can see patients for sick visits, but many do, so it is worth a call to see if yours does. If you don't know where your local health department is, you can check in your phone book. The CDC also has a tool to help you locate your state's health department, and many state websites also have listings of all of their local health departments.  3) Find a Walk-in Clinic If your illness is not likely to be very severe or complicated, you may want to try a walk in clinic. These are popping up all over the country in pharmacies, drugstores, and shopping centers. They're usually staffed by nurse practitioners or physician assistants that have been trained to treat  common illnesses and complaints. They're usually fairly quick and inexpensive. However, if you have serious medical issues or chronic medical problems, these are probably not your best option.  No Primary Care Doctor: - Call Health Connect at  [phone] - they can help you locate a primary care doctor that  accepts your insurance, provides certain services, etc. - Physician Referral Service- [phone]  Chronic Pain Problems: Organization         Address  Phone   Notes  [facility]  [phone] Patients need to be referred by their primary care doctor.   Medication Assistance: Organization         Address  Phone   Notes  [facility] [address] [place], [place] [id number] [phone] --Must be a resident of [place] -- Must have NO insurance coverage whatsoever (no Medicaid/ Medicare, etc.) -- The pt. MUST have a primary care doctor that directs their care regularly and follows them in the community   MedAssist  [phone]   [employer]  [phone]    Agencies that provide inexpensive medical care: Organization         Address  Phone   Notes  [facility]  [phone]   [facility]    [phone]   [facility] [address] [place], [place] [address] [phone]   [facility] [address], [place] [phone]   Planned Parenthood    [phone]   [facility]    [phone]   [facility] and [facility]  [address], [place] Phone:  [phone], Fax:  [phone] Hours of Operation:  9 am - 6 pm, M-F.  Also accepts Medicaid/Medicare and self-pay.  [facility]  [address], [place] Phone: [phone], Fax: [phone]. Hours of Operation:  8:30 am - 5:30 pm, M-F.  Also accepts Medicaid and self-pay.  [facility] [place] [address] [place] Phone: [phone]   [facility] [address] Natasha Bence [place], [place] [phone], Ext. [phone] Mondays & Thursdays: 7-9 AM.  First 15 patients are seen on a first come, first serve basis.    Medicaid-accepting [facility] Providers:  Organization         Address  Phone   Notes  [facility] [address], [place] [phone] Also accepts self-pay patients.  [facility] [address] Laurell Josephs [place], [place]  [phone]   [facility] [address], [place] [phone]   [facility] [address] [place] [phone]   Renaye Rakers [address], [place]   [phone] Only accepts [place] Access [place] patients after they have their name applied to their card.   Self-Pay (no insurance) in [facility]:  Organization         Address  Phone   Notes  Sickle Cell Patients, [facility] [address] [place], [place] [phone]   [facility] [address] [place], [place] [phone]   [facility] [place]  [address], [place] [phone]   [facility]/Dr. Osei-Bonsu  [address] [place] or [address], [place] [phone] Phone number for both [place] and [place] locations is the same.  Urgent Medical and [facility] [address], [place] [phone]   [facility] [address] [place] or [address] [phone]   [facility] [address] [place] [phone], phone; [phone], fax Sees patients 1st and 3rd Saturday of every month.  Must not qualify for public or private insurance (i.e. Medicaid, Medicare, Dent Health Choice, Veterans' Benefits)  Household income should be no more than 200% of the  poverty level The clinic cannot treat you if you are pregnant or think you are pregnant  Sexually transmitted diseases are not treated at the clinic.    Dental Care: Organization         Address  Phone  Notes  [facility] Department of [facility] [address] [place], [place] [phone] Accepts children up to age 5 who are enrolled in [place] or [place] Health Choice; pregnant women with a Medicaid card; and children who have applied for Medicaid or  Health Choice, but were declined, whose parents can pay a reduced fee at time of service.  [facility] Department of  [facility]  [address], [place] [phone] Accepts children up to age 32 who are enrolled in Medicaid or [place]; pregnant women with a Medicaid card; and children who have applied for Medicaid or [place] Health Choice, but were declined, whose parents can pay a reduced fee at time of service.  [facility]  [place] [phone] Patients are seen by appointment only. Walk-ins are not accepted. [place] will see patients 32 years of age and older. Monday - Tuesday (8am-5pm) Most Wednesdays (8:30-5pm) $30 per visit, cash only  [facility] Adult Dental Access PROGRAM  [address], [facility] [phone] Patients are seen by appointment only. Walk-ins are not accepted. [place] will see patients 82 years of age and older. One Wednesday Evening (Monthly: Volunteer Based).  $30 per visit, cash only  [place]  [phone] for adults; Children under age 43, call Graduate Pediatric Dentistry at [phone]. Children aged 37-14, please call [phone] to request a pediatric application.  Dental services are provided in all areas of dental care including fillings, crowns and bridges, complete and partial dentures, implants, gum treatment, root canals, and extractions.  Preventive care is also provided. Treatment is provided to both adults and children. Patients are selected via a lottery and there is often a waiting list.   [facility] [address], [place]  [phone] [email or web address]   [facility] [address] [place], [place] [phone], Ext. [phone] Second and Fourth Thursday of each month, opens at 6:30 AM; Clinic ends at 9 AM.  Patients are seen on a first-come first-served basis, and a limited number are seen during each clinic.   [facility]  [address] Hillard Danker [place], [place] [phone]   Eligibility Requirements You must have lived in [place], [place], or [place] counties for at least the last three months.   You cannot be eligible for state or federal sponsored [employer], including [employer], [place], or [employer].   You generally cannot be eligible for healthcare insurance through your employer.    How to apply: Eligibility screenings are held every Tuesday and Wednesday afternoon from 1:00 pm until 4:00 pm. You do not need an appointment for the interview!  [facility] [address] [place], [place]   [place]  [place] Department  [place]  [phone]    Behavioral Health Resources in the Community: Intensive Outpatient Programs Organization         Address  Phone  Notes  [place]in Winfred. [address], [place], [place] [phone]   [facility] Outpatient [address] [place], [place]   ADS: Alcohol & Drug Svcs [address] [place], [place]   [place] [address]  [place], [place] or [phone]   Substance Abuse Resources Organization         Address  Phone  Notes  Alcohol and Drug Services  [phone]   [place]  [phone]   [facility]   Chinita Pester  [phone]   Residential & Outpatient Substance Abuse Program  [phone]   Psychological Services Organization         Address  Phone  Notes  [place]   [place] [address] [place] or [phone]    Mobile Crisis Teams Organization         Address  Phone  Notes  [facility], Mobile Crisis Care Unit  [phone]   [facility]otherapeutic [facility]  [address] [place], [place]   [place] Levels [address] [place] [phone]    Self-Help/Support Groups Organization         Address  Phone             Notes  [place]. of [place] - variety of support groups  [place] Call for more information  Narcotics Anonymous (NA), [facility] [address], [employer] [place]  2 meetings at this location   Special educational needs teacher         Address  Phone  Notes  [facility]idential Treatment [place],    [place]  [phone]   [facility]  [address], [place] [phone], [place], [place]   [place], [place] [phone] Admissions: 8am-3pm M-F  Incentives Substance [place] [address],    [place], [place] [phone]   [facility] [address] [place], [place], [place]   [facility] [address],  [place], [place]   [facility]ight Programs - Intensive Outpatient [address] Kristeen [address], [place], [place]   [facility].,  [place], [place] [phone] or [phone]   [facility] Services (RTS) [address], [place], [name] Accepts Medicaid  [facility] [address],  [place]  [phone] Substance Abuse/Addiction Treatment   [facility] Organization         Address  Phone  Notes  [facility]  [phone]   Domenic Schwab, PhD [address] Arlis Porta [place], [place]   [phone] or [phone]   [place], [place] [phone]   [facility] [address], [place], [place] [phone] Insurance/Medicaid/sponsorship through [facility] and Families [address]                                    [place], [place] [phone] [place] [address], [place] [phone]    Dr. Adele Schilder  [phone]   [facility] Dept. 1) [address] [place] 2) [place] 3)  [place] [address], [place] [phone]  [phone]   [place] [phone] or [phone] (After Hours)

## 2014-09-14 NOTE — ED Notes (Signed)
Pt here due to cough and he "wants to make sure its not pneumonia" no sob but pt has had some chills

## 2016-01-02 ENCOUNTER — Encounter (HOSPITAL_BASED_OUTPATIENT_CLINIC_OR_DEPARTMENT_OTHER): Payer: Self-pay | Admitting: *Deleted

## 2016-01-02 ENCOUNTER — Emergency Department (HOSPITAL_BASED_OUTPATIENT_CLINIC_OR_DEPARTMENT_OTHER): Payer: Self-pay

## 2016-01-02 ENCOUNTER — Emergency Department (HOSPITAL_BASED_OUTPATIENT_CLINIC_OR_DEPARTMENT_OTHER)
Admission: EM | Admit: 2016-01-02 | Discharge: 2016-01-02 | Disposition: A | Discharge status: Home / Self Care | Payer: Self-pay | Attending: Emergency Medicine | Admitting: Emergency Medicine

## 2016-01-02 DIAGNOSIS — L720 Epidermal cyst: Secondary | ICD-10-CM | POA: Insufficient documentation

## 2016-01-02 DIAGNOSIS — L089 Local infection of the skin and subcutaneous tissue, unspecified: Secondary | ICD-10-CM

## 2016-01-02 DIAGNOSIS — M79644 Pain in right finger(s): Secondary | ICD-10-CM | POA: Insufficient documentation

## 2016-01-02 IMAGING — DX DG FINGER THUMB 2+V*R*
3 series · 3 of 3 positions shown · non-contrast
Comparison: None.

CLINICAL DATA: Right thumb pain at the interphalangeal joint for
several weeks. No known injury.

EXAM:
RIGHT THUMB 2+V

[finger obl]
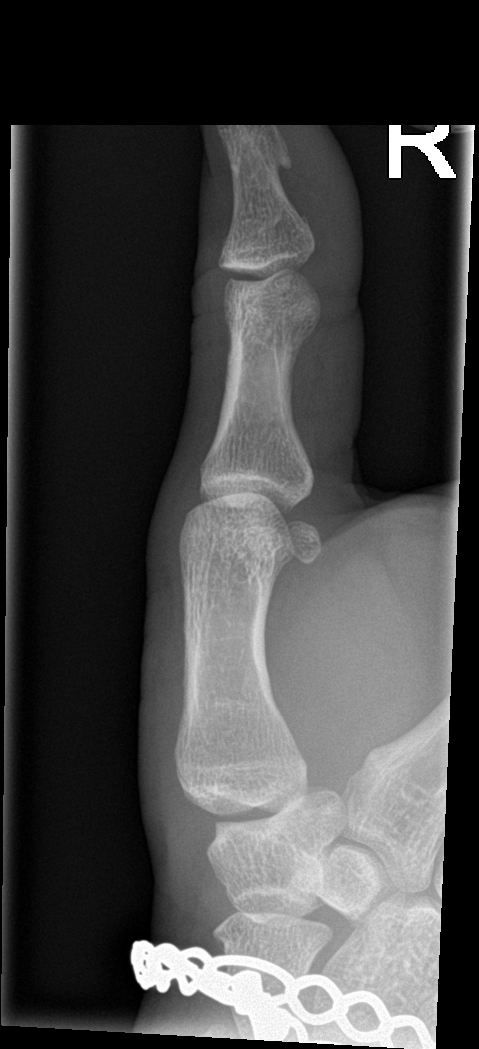

[finger lat]
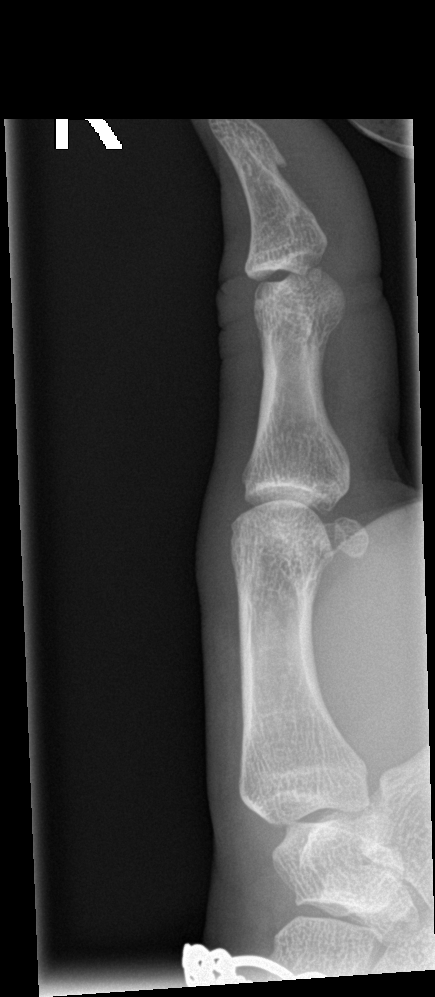

[finger ap]
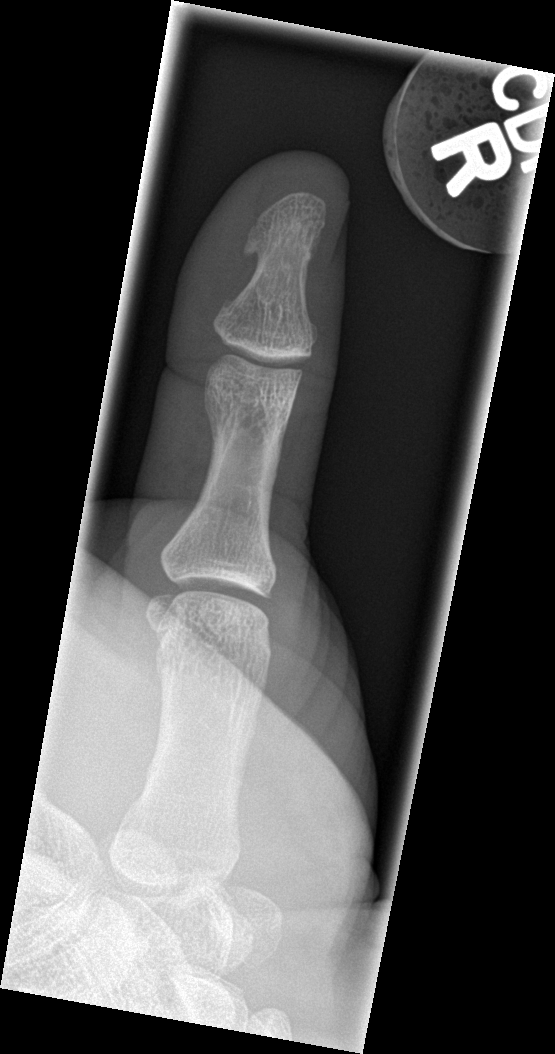

[3 of 3 positions shown; findings below may reference images not displayed]

FINDINGS: There is no evidence of fracture or dislocation. There is no
evidence of arthropathy or other focal bone abnormality. Soft
tissues are unremarkable
IMPRESSION: Normal examination.

## 2016-01-02 MED ORDER — LIDOCAINE-EPINEPHRINE 2 %-1:100000 IJ SOLN
20.0000 mL | Freq: Once | INTRAMUSCULAR | Status: AC
  Administered 2016-01-02: 20 mL
  Filled 2016-01-02: qty 1

## 2016-01-02 MED ORDER — HYDROCODONE-ACETAMINOPHEN 5-325 MG PO TABS
1.0000 | ORAL_TABLET | Freq: Once | ORAL | Status: DC
  Filled 2016-01-02: qty 1

## 2016-01-02 MED ORDER — HYDROCODONE-ACETAMINOPHEN 5-325 MG PO TABS: 1.0000 | ORAL_TABLET | Freq: Four times a day (QID) | ORAL | 0 refills | Status: DC | PRN

## 2016-01-02 MED ORDER — IBUPROFEN 800 MG PO TABS
800.0000 mg | ORAL_TABLET | Freq: Once | ORAL | Status: AC
  Administered 2016-01-02: 800 mg via ORAL
  Filled 2016-01-02: qty 1

## 2016-01-02 NOTE — ED Provider Notes (Signed)
[facility] Provider Note   CSN: [id number] Arrival date & time: 01/02/16  0212  First Provider Contact:  None       History   Chief Complaint Chief Complaint  Patient presents with  . Abscess    HPI Michael Fleming is a 45 y.o. male who has had a mass on his chest medial to the right nipple for several months. Over the past 2 days it has become more swollen and painful with a pointing pustule overlying it. Pain is moderate and worse with palpation. There is been no drainage from it. He is also having pain in his dorsal right thumb IP joint. The pain is present only in extension and relieved by flexion. He is not aware of any injury to the thumb. He also complains of a long-standing pain in his left lower second molar.  HPI  Past Medical History:  Diagnosis Date  . Seizures (HCC)    last seizure age 45    There are no active problems to display for this patient.   Past Surgical History:  Procedure Laterality Date  . BRAIN SURGERY     1981 blot clot removed  . FOOT SURGERY     right bunion removed       Home Medications    Prior to Admission medications   Medication Sig Start Date End Date Taking? Authorizing Provider  azithromycin (ZITHROMAX) 250 MG tablet Take 1 tablet (250 mg total) by mouth daily. Take first 2 tablets together, then 1 every day until finished. 09/14/14   Marissa Sciacca, PA-C  benzonatate (TESSALON) 100 MG capsule Take 1 capsule (100 mg total) by mouth 3 (three) times daily as needed for cough. 10/24/13   Shon Batonourtney F Horton, MD  naproxen (NAPROSYN) 500 MG tablet Take 1 tablet (500 mg total) by mouth 2 (two) times daily. 04/08/14   Hope Orlene OchM Neese, NP  sulfamethoxazole-trimethoprim (SEPTRA DS) 800-160 MG per tablet Take 1 tablet by mouth every 12 (twelve) hours. 04/08/14   Hope Orlene OchM Neese, NP    Family History No family history on file.  Social History Social History  Substance Use Topics  . Smoking status: Never Smoker  . Smokeless  tobacco: Never Used  . Alcohol use Yes     Comment: occasional     Allergies   Review of patient's allergies indicates no known allergies.   Review of Systems Review of Systems  All other systems reviewed and are negative.    Physical Exam Updated Vital Signs BP (!) 145/104 (BP Location: Left Arm)   Pulse 70   Temp 98.2 F (36.8 C) (Oral)   Resp 18   Ht 5\' 7"  (1.702 m)   Wt 190 lb (86.2 kg)   SpO2 98%   BMI 29.76 kg/m   Physical Exam General: Well-developed, well-nourished male in no acute distress; appearance consistent with age of record HENT: normocephalic; atraumatic; carious left lower second molar Eyes: pupils equal, round and reactive to light; extraocular muscles intact Neck: supple Heart: regular rate and rhythm Lungs: clear to auscultation bilaterally Abdomen: soft; nondistended; nontender; bowel sounds present Extremities: No deformity; full range of motion; pulses normal Neurologic: Awake, alert and oriented; motor function intact in all extremities and symmetric; no facial droop Skin: Warm and dry; indurated mass right chest medial to the nipple with pointing pustule but no warmth or erythema Psychiatric: Normal mood and affect    ED Treatments / Results  Nursing notes and vitals signs, including pulse oximetry, reviewed.  Summary of this visit's results, reviewed by myself:  Imaging Studies: Dg Finger Thumb Right  Result Date: 01/02/2016 CLINICAL DATA:  Right thumb pain at the interphalangeal joint for several weeks. No known injury. EXAM: RIGHT THUMB 2+V COMPARISON:  None. FINDINGS: There is no evidence of fracture or dislocation. There is no evidence of arthropathy or other focal bone abnormality. Soft tissues are unremarkable IMPRESSION: Normal examination. Electronically Signed   By: Beckie Salts M.D.   On: 01/02/2016 04:04     Procedures (including critical care time)  INCISION AND DRAINAGE Performed by: Paula Libra L Consent: Verbal  consent obtained. Risks and benefits: risks, benefits and alternatives were discussed Type: abscessed epidermoid cyst   Body area: Right chest  Anesthesia: local infiltration  Incision was made with a scalpel.  Local anesthetic: lidocaine 2 % with epinephrine  Anesthetic total: 6 ml  Complexity: complex Blunt dissection to break up loculations. Some fragments of cyst wall were removed but the bulk of the cyst remains.   Drainage: purulent with a small amount of epidermoid cyst contents   Drainage amount: Moderate   Packing material: None   Patient tolerance: Patient tolerated the procedure well with no immediate complications. The wound was left open to heal secondarily. We will refer to general surgery for definitive excision of the cyst.      Final Clinical Impressions(s) / ED Diagnoses   Final diagnoses:  Infected epidermoid cyst  Pain of right thumb      Paula Libra, MD 01/02/16 [phone]

## 2016-01-02 NOTE — ED Triage Notes (Signed)
C/o abscess rt chest area x months but is getting bigger and more painful

## 2016-01-02 NOTE — ED Notes (Signed)
Pt verbalizes understanding of d/c instructions and denies any further needs at this time. 

## 2016-08-09 ENCOUNTER — Encounter (HOSPITAL_BASED_OUTPATIENT_CLINIC_OR_DEPARTMENT_OTHER): Payer: Self-pay | Admitting: Emergency Medicine

## 2016-08-09 ENCOUNTER — Emergency Department (HOSPITAL_BASED_OUTPATIENT_CLINIC_OR_DEPARTMENT_OTHER)
Admission: EM | Admit: 2016-08-09 | Discharge: 2016-08-09 | Disposition: A | Discharge status: Home / Self Care | Payer: Self-pay | Attending: Emergency Medicine | Admitting: Emergency Medicine

## 2016-08-09 DIAGNOSIS — K0889 Other specified disorders of teeth and supporting structures: Secondary | ICD-10-CM | POA: Insufficient documentation

## 2016-08-09 MED ORDER — TRAMADOL HCL 50 MG PO TABS
50.0000 mg | ORAL_TABLET | Freq: Once | ORAL | Status: AC
  Administered 2016-08-09: 50 mg via ORAL
  Filled 2016-08-09: qty 1

## 2016-08-09 MED ORDER — KETOROLAC TROMETHAMINE 60 MG/2ML IM SOLN
60.0000 mg | Freq: Once | INTRAMUSCULAR | Status: AC
  Administered 2016-08-09: 60 mg via INTRAMUSCULAR
  Filled 2016-08-09: qty 2

## 2016-08-09 MED ORDER — TRAMADOL HCL 50 MG PO TABS: 50.0000 mg | ORAL_TABLET | Freq: Two times a day (BID) | ORAL | 0 refills | Status: DC | PRN

## 2016-08-09 NOTE — ED Provider Notes (Signed)
[facility] Provider Note   CSN: [id number] Arrival date & time: 08/09/16  0108     History   Chief Complaint Chief Complaint  Patient presents with  . Dental Pain    HPI Michael Fleming is a 46 y.o. male with no significant past medical history presenting today with dental pain. Patient complains of pain in his bottom left tooth. He states it started yesterday morning after eating candy. Use of intermittent pain but now is constant and throbbing. He is not having a dentist to follow-up with. He has needed teeth pulled on the past. He denies any swelling or pus. He's had no fevers. There are no further complaints.  10 Systems reviewed and are negative for acute change except as noted in the HPI.   HPI  Past Medical History:  Diagnosis Date  . Seizures (HCC)    last seizure age 46    There are no active problems to display for this patient.   Past Surgical History:  Procedure Laterality Date  . BRAIN SURGERY     1981 blot clot removed  . FOOT SURGERY     right bunion removed       Home Medications    Prior to Admission medications   Medication Sig Start Date End Date Taking? Authorizing Provider  traMADol (ULTRAM) 50 MG tablet Take 1 tablet (50 mg total) by mouth every 12 (twelve) hours as needed for severe pain. 08/09/16   Tomasita CrumbleAdeleke Victoriah Wilds, MD    Family History No family history on file.  Social History Social History  Substance Use Topics  . Smoking status: Never Smoker  . Smokeless tobacco: Never Used  . Alcohol use Yes     Comment: occasional     Allergies   Patient has no known allergies.   Review of Systems Review of Systems   Physical Exam Updated Vital Signs BP (!) 156/103 (BP Location: Left Arm)   Pulse 79   Temp 98.4 F (36.9 C) (Oral)   Resp 16   Ht 5\' 7"  (1.702 m)   Wt 211 lb (95.7 kg)   SpO2 97%   BMI 33.05 kg/m   Physical Exam  Constitutional: He is oriented to person, place, and time. Vital signs are normal. He  appears well-developed and well-nourished.  Non-toxic appearance. He does not appear ill. No distress.  HENT:  Head: Normocephalic and atraumatic.  Nose: Nose normal.  Mouth/Throat: Oropharynx is clear and moist. No oropharyngeal exudate.  Tooth 18 is cracked, no swelling or abscess formation.  No TTP of the floor of the mouth  Eyes: Conjunctivae and EOM are normal. Pupils are equal, round, and reactive to light. No scleral icterus.  Neck: Normal range of motion. Neck supple. No tracheal deviation, no edema, no erythema and normal range of motion present. No thyroid mass and no thyromegaly present.  Cardiovascular: Normal rate, regular rhythm, S1 normal, S2 normal, normal heart sounds, intact distal pulses and normal pulses.  Exam reveals no gallop and no friction rub.   No murmur heard. Pulmonary/Chest: Effort normal and breath sounds normal. No respiratory distress. He has no wheezes. He has no rhonchi. He has no rales.  Abdominal: Soft. Normal appearance and bowel sounds are normal. He exhibits no distension, no ascites and no mass. There is no hepatosplenomegaly. There is no tenderness. There is no rebound, no guarding and no CVA tenderness.  Musculoskeletal: Normal range of motion. He exhibits no edema or tenderness.  Lymphadenopathy:    He  has no cervical adenopathy.  Neurological: He is alert and oriented to person, place, and time. He has normal strength. No cranial nerve deficit or sensory deficit.  Skin: Skin is warm, dry and intact. No petechiae and no rash noted. He is not diaphoretic. No erythema. No pallor.  Nursing note and vitals reviewed.    ED Treatments / Results  Labs (all labs ordered are listed, but only abnormal results are displayed) Labs Reviewed - No data to display  EKG  EKG Interpretation None       Radiology No results found.  Procedures Procedures (including critical care time)  Medications Ordered in ED Medications  ketorolac (TORADOL) injection  60 mg (not administered)  traMADol (ULTRAM) tablet 50 mg (not administered)     Initial Impression / Assessment and Plan / ED Course  I have reviewed the triage vital signs and the nursing notes.  Pertinent labs & imaging results that were available during my care of the patient were reviewed by me and considered in my medical decision making (see chart for details).       Patient presents emergency department for dental pain. Likely had this bacterial spread to the nerve. Tooth is cracked. Physical exam is negative for liquids. No surrounding abscess or inflammation or need for antibiotics. He was given Toradol and Demerol emergency department. We'll discharge home with tramadol to take as needed. Dental follow-up provided and advised within 3 days. He appears well-developed acute distress, vital signs were within his normal limits and he is safe for discharge. Final Clinical Impressions(s) / ED Diagnoses   Final diagnoses:  Pain, dental    New Prescriptions New Prescriptions   TRAMADOL (ULTRAM) 50 MG TABLET    Take 1 tablet (50 mg total) by mouth every 12 (twelve) hours as needed for severe pain.     Tomasita Crumble, MD 08/09/16 [phone]

## 2016-08-30 ENCOUNTER — Ambulatory Visit: Payer: Self-pay | Attending: Internal Medicine

## 2016-09-29 ENCOUNTER — Encounter: Payer: Self-pay | Admitting: Family Medicine

## 2016-09-29 ENCOUNTER — Ambulatory Visit (INDEPENDENT_AMBULATORY_CARE_PROVIDER_SITE_OTHER): Payer: Self-pay | Admitting: Family Medicine

## 2016-09-29 VITALS — BP 152/96 | HR 74 | Temp 98.6°F | Resp 18 | Ht 67.0 in | Wt 205.0 lb

## 2016-09-29 DIAGNOSIS — Z91843 Risk for dental caries, high: Secondary | ICD-10-CM

## 2016-09-29 DIAGNOSIS — M25674 Stiffness of right foot, not elsewhere classified: Secondary | ICD-10-CM

## 2016-09-29 DIAGNOSIS — H538 Other visual disturbances: Secondary | ICD-10-CM

## 2016-09-29 DIAGNOSIS — I10 Essential (primary) hypertension: Secondary | ICD-10-CM

## 2016-09-29 DIAGNOSIS — Z131 Encounter for screening for diabetes mellitus: Secondary | ICD-10-CM

## 2016-09-29 DIAGNOSIS — Z23 Encounter for immunization: Secondary | ICD-10-CM

## 2016-09-29 DIAGNOSIS — R208 Other disturbances of skin sensation: Secondary | ICD-10-CM

## 2016-09-29 LAB — CBC WITH DIFFERENTIAL/PLATELET
BASOS ABS: 50 {cells}/uL (ref 0–200)
Basophils Relative: 1 %
EOS PCT: 2 %
Eosinophils Absolute: 100 cells/uL (ref 15–500)
HCT: 38.9 % (ref 38.5–50.0)
Hemoglobin: 13 g/dL — ABNORMAL LOW (ref 13.2–17.1)
LYMPHS ABS: 2650 {cells}/uL (ref 850–3900)
Lymphocytes Relative: 53 %
MCH: 28 pg (ref 27.0–33.0)
MCHC: 33.4 g/dL (ref 32.0–36.0)
MCV: 83.8 fL (ref 80.0–100.0)
MONOS PCT: 7 %
MPV: 10.2 fL (ref 7.5–12.5)
Monocytes Absolute: 350 cells/uL (ref 200–950)
NEUTROS ABS: 1850 {cells}/uL (ref 1500–7800)
Neutrophils Relative %: 37 %
PLATELETS: 273 10*3/uL (ref 140–400)
RBC: 4.64 MIL/uL (ref 4.20–5.80)
RDW: 13.7 % (ref 11.0–15.0)
WBC: 5 10*3/uL (ref 3.8–10.8)

## 2016-09-29 LAB — COMPLETE METABOLIC PANEL WITH GFR
ALK PHOS: 61 U/L (ref 40–115)
ALT: 21 U/L (ref 9–46)
AST: 21 U/L (ref 10–40)
Albumin: 4.2 g/dL (ref 3.6–5.1)
BUN: 13 mg/dL (ref 7–25)
CALCIUM: 9.3 mg/dL (ref 8.6–10.3)
CO2: 25 mmol/L (ref 20–31)
Chloride: 104 mmol/L (ref 98–110)
Creat: 1.18 mg/dL (ref 0.60–1.35)
GFR, EST AFRICAN AMERICAN: 84 mL/min (ref >=60)
GFR, Est Non African American: 73 mL/min (ref >=60)
GLUCOSE: 93 mg/dL (ref 65–99)
POTASSIUM: 4 mmol/L (ref 3.5–5.3)
Sodium: 140 mmol/L (ref 135–146)
Total Bilirubin: 0.5 mg/dL (ref 0.2–1.2)
Total Protein: 7 g/dL (ref 6.1–8.1)

## 2016-09-29 LAB — LIPID PANEL
CHOLESTEROL: 212 mg/dL — AB (ref <200)
HDL: 40 mg/dL — ABNORMAL LOW (ref >40)
LDL Cholesterol: 155 mg/dL — ABNORMAL HIGH (ref <100)
TRIGLYCERIDES: 83 mg/dL (ref <150)
Total CHOL/HDL Ratio: 5.3 Ratio — ABNORMAL HIGH (ref <5.0)
VLDL: 17 mg/dL (ref <30)

## 2016-09-29 LAB — POCT URINALYSIS DIP (DEVICE)
Bilirubin Urine: NEGATIVE
Glucose, UA: NEGATIVE mg/dL
Hgb urine dipstick: NEGATIVE
Ketones, ur: NEGATIVE mg/dL
Leukocytes, UA: NEGATIVE
Nitrite: NEGATIVE
Protein, ur: NEGATIVE mg/dL
UROBILINOGEN UA: 0.2 mg/dL (ref 0.0–1.0)
pH: 5.5 (ref 5.0–8.0)

## 2016-09-29 LAB — THYROID PANEL WITH TSH
Free Thyroxine Index: 2.8 (ref 1.4–3.8)
T3 UPTAKE: 29 % (ref 22–35)
T4, Total: 9.5 ug/dL (ref 4.5–12.0)
TSH: 0.54 mIU/L (ref 0.40–4.50)

## 2016-09-29 MED ORDER — AMLODIPINE BESYLATE 5 MG PO TABS: 5.0000 mg | ORAL_TABLET | Freq: Every day | ORAL | 3 refills | Status: DC

## 2016-09-29 MED FILL — ?AMLODIPINE BESYLATE 5 MG T: 5 | 30 days supply | Qty: 30 | Fill #0
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
## 2016-09-29 NOTE — Patient Instructions (Addendum)
[facility].  I am referring to neurology and podiatry for further evaluation of decreased movement of foot and bunion of left foot.

## 2016-09-30 LAB — HEMOGLOBIN A1C
HEMOGLOBIN A1C: 5.6 % (ref <5.7)
MEAN PLASMA GLUCOSE: 114 mg/dL

## 2016-10-20 ENCOUNTER — Encounter: Payer: Self-pay | Admitting: Family Medicine

## 2016-10-20 ENCOUNTER — Ambulatory Visit (INDEPENDENT_AMBULATORY_CARE_PROVIDER_SITE_OTHER): Payer: Self-pay | Admitting: Family Medicine

## 2016-10-20 VITALS — BP 142/90 | HR 70 | Temp 98.2°F | Resp 16 | Ht 67.0 in | Wt 206.0 lb

## 2016-10-20 DIAGNOSIS — I10 Essential (primary) hypertension: Secondary | ICD-10-CM

## 2016-10-20 DIAGNOSIS — Z789 Other specified health status: Secondary | ICD-10-CM

## 2016-10-20 MED ORDER — LOSARTAN POTASSIUM 25 MG PO TABS: 50.0000 mg | ORAL_TABLET | Freq: Every day | ORAL | 1 refills | Status: DC

## 2016-10-20 MED ORDER — TRAMADOL HCL 50 MG PO TABS
50.0000 mg | ORAL_TABLET | Freq: Three times a day (TID) | ORAL | 0 refills | Status: DC | PRN
Start: 2016-10-20 — End: 2017-01-20

## 2016-10-20 MED FILL — LOSARTAN POTASSIUM 25 MG TA: 25 | 30 days supply | Qty: 60 | Fill #0

## 2016-10-20 NOTE — Patient Instructions (Addendum)
Discontinue Norvasc and I am starting you on Losartan. Start Losartan 25 mg (1 pill) for 2 weeks to see how your body tolerates medication. Two weeks increase dose to 50 mg daily (2 pills). Return in 2 weeks for blood pressure check.

## 2016-10-20 NOTE — Progress Notes (Signed)
   Patient ID: Michael Fleming, male    DOB: July 02, 1970, 46 y.o.   MRN: [id number]  PCP: Bing NeighborsHarris, Edel Rivero S, FNP  Chief Complaint  Patient presents with  . Follow-up    3 WEEKS ON BLOOD PRESSURE    Subjective:  HPI  Michael FavorJohn L Fleming is a 46 y.o. male presents for 3 week blood pressure follow-up. Michael RuizJohn was last seen in office, 09/29/2016 and started on antihypertensive medication with Amlodipine 5 mg.  He reports compliance with medication, however has suffered worsening headaches,dizziness, and  excessive fatigue since taking medication. He has continued taking medication in hopes that the symptoms would subside. Today, he requests to start another medication.  Social History   Social History  . Marital status: Single    Spouse name: N/A  . Number of children: N/A  . Years of education: N/A   Occupational History  . Not on file.   Social History Main Topics  . Smoking status: Never Smoker  . Smokeless tobacco: Never Used  . Alcohol use Yes     Comment: occasional  . Drug use: No  . Sexual activity: Not on file   Other Topics Concern  . Not on file   Social History Narrative  . No narrative on file    History reviewed. No pertinent family history.   Review of Systems See HPI  Prior to Admission medications   Medication Sig Start Date End Date Taking? Authorizing Provider  amLODipine (NORVASC) 5 MG tablet Take 1 tablet (5 mg total) by mouth daily. 09/29/16  Yes Bing NeighborsHarris, Michai Dieppa S, FNP  traMADol (ULTRAM) 50 MG tablet Take 1 tablet (50 mg total) by mouth every 12 (twelve) hours as needed for severe pain. 08/09/16  Yes Tomasita Crumbleni, Adeleke, MD    Past Medical, Surgical Family and Social History reviewed and updated.    Objective:   Today's Vitals   10/20/16 1148  BP: (!) 142/90  Pulse: 70  Resp: 16  Temp: 98.2 F (36.8 C)  TempSrc: Oral  SpO2: 97%  Weight: 206 lb (93.4 kg)  Height: 5\' 7"  (1.702 m)    Wt Readings from Last 3 Encounters:  10/20/16 206 lb (93.4 kg)   09/29/16 205 lb (93 kg)  08/09/16 211 lb (95.7 kg)   Physical Exam  Constitutional: He is oriented to person, place, and time. He appears well-developed.  Cardiovascular: Normal rate, regular rhythm, normal heart sounds and intact distal pulses.   Pulmonary/Chest: Effort normal and breath sounds normal.  Neurological: He is alert and oriented to person, place, and time. He has normal reflexes. He displays normal reflexes. No cranial nerve deficit. He exhibits normal muscle tone. Coordination normal.  Skin: Skin is warm and dry.  Psychiatric: He has a normal mood and affect. His behavior is normal. Judgment and thought content normal.     Assessment & Plan:  1. Essential hypertension 2. Medication intolerance -Discontinue Norvasc -Start Losartan 25 mg, daily for 1 week. Week 2, increase dose 50 mg daily.  RTC: 2 weeks for a blood pressure recheck  Keep follow-up appointment    Godfrey PickKimberly S. Tiburcio PeaHarris, MSN, FNP-C [facility]  [address] [place], [place] [address] [phone]

## 2016-10-23 DIAGNOSIS — R519 Headache, unspecified: Secondary | ICD-10-CM | POA: Insufficient documentation

## 2016-10-23 DIAGNOSIS — Z9889 Other specified postprocedural states: Secondary | ICD-10-CM | POA: Insufficient documentation

## 2016-10-23 DIAGNOSIS — I10 Essential (primary) hypertension: Secondary | ICD-10-CM | POA: Insufficient documentation

## 2016-10-23 DIAGNOSIS — R51 Headache: Secondary | ICD-10-CM

## 2016-10-24 MED FILL — traMADol HCL 50 MG TABS: 50 | 10 days supply | Qty: 30 | Fill #0

## 2016-10-31 ENCOUNTER — Ambulatory Visit: Payer: Self-pay | Attending: Family Medicine

## 2016-11-04 ENCOUNTER — Ambulatory Visit: Payer: Self-pay | Admitting: Family Medicine

## 2016-11-14 ENCOUNTER — Telehealth: Payer: Self-pay | Admitting: Family Medicine

## 2016-11-14 NOTE — Telephone Encounter (Signed)
PT called to request to speak with the Financial dept. [facility] please follow up

## 2017-01-16 MED FILL — LOSARTAN POTASSIUM 25 MG TA: 25 | 30 days supply | Qty: 60 | Fill #1

## 2017-01-20 ENCOUNTER — Ambulatory Visit (INDEPENDENT_AMBULATORY_CARE_PROVIDER_SITE_OTHER): Payer: Self-pay | Admitting: Family Medicine

## 2017-01-20 ENCOUNTER — Telehealth: Payer: Self-pay

## 2017-01-20 ENCOUNTER — Ambulatory Visit (HOSPITAL_COMMUNITY)
Admission: RE | Admit: 2017-01-20 | Discharge: 2017-01-20 | Disposition: A | Discharge status: Home / Self Care | Payer: Self-pay | Source: Ambulatory Visit | Attending: Family Medicine | Admitting: Family Medicine

## 2017-01-20 ENCOUNTER — Encounter: Payer: Self-pay | Admitting: Family Medicine

## 2017-01-20 VITALS — BP 142/87 | HR 73 | Temp 98.0°F | Resp 14 | Ht 67.0 in | Wt 189.6 lb

## 2017-01-20 DIAGNOSIS — I10 Essential (primary) hypertension: Secondary | ICD-10-CM

## 2017-01-20 DIAGNOSIS — M79641 Pain in right hand: Secondary | ICD-10-CM

## 2017-01-20 DIAGNOSIS — Z23 Encounter for immunization: Secondary | ICD-10-CM

## 2017-01-20 DIAGNOSIS — M79642 Pain in left hand: Secondary | ICD-10-CM

## 2017-01-20 DIAGNOSIS — M25562 Pain in left knee: Secondary | ICD-10-CM | POA: Insufficient documentation

## 2017-01-20 DIAGNOSIS — R0981 Nasal congestion: Secondary | ICD-10-CM

## 2017-01-20 LAB — POCT URINALYSIS DIP (DEVICE)
Bilirubin Urine: NEGATIVE
GLUCOSE, UA: NEGATIVE mg/dL
Hgb urine dipstick: NEGATIVE
Ketones, ur: NEGATIVE mg/dL
LEUKOCYTES UA: NEGATIVE
NITRITE: NEGATIVE
Protein, ur: NEGATIVE mg/dL
Specific Gravity, Urine: 1.025 (ref 1.005–1.030)
Urobilinogen, UA: 2 mg/dL — ABNORMAL HIGH (ref 0.0–1.0)
pH: 6 (ref 5.0–8.0)

## 2017-01-20 IMAGING — DX DG KNEE COMPLETE 4+V*L*
4 series · 4 of 4 positions shown · non-contrast
Comparison: None.

CLINICAL DATA: Diffuse left knee pain for 2 weeks. No recent
injury.

EXAM:
LEFT KNEE - COMPLETE 4+ VIEW

[knee ap]
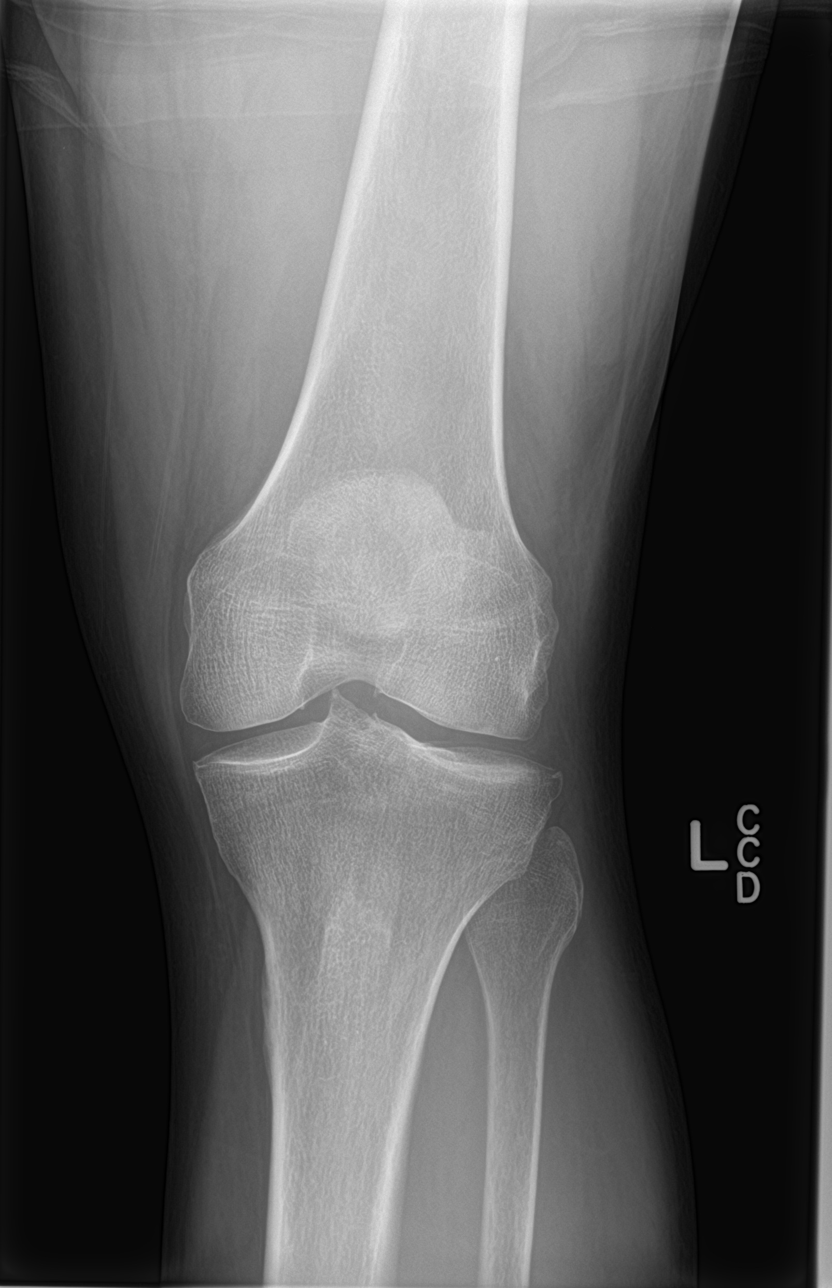

[knee lat]
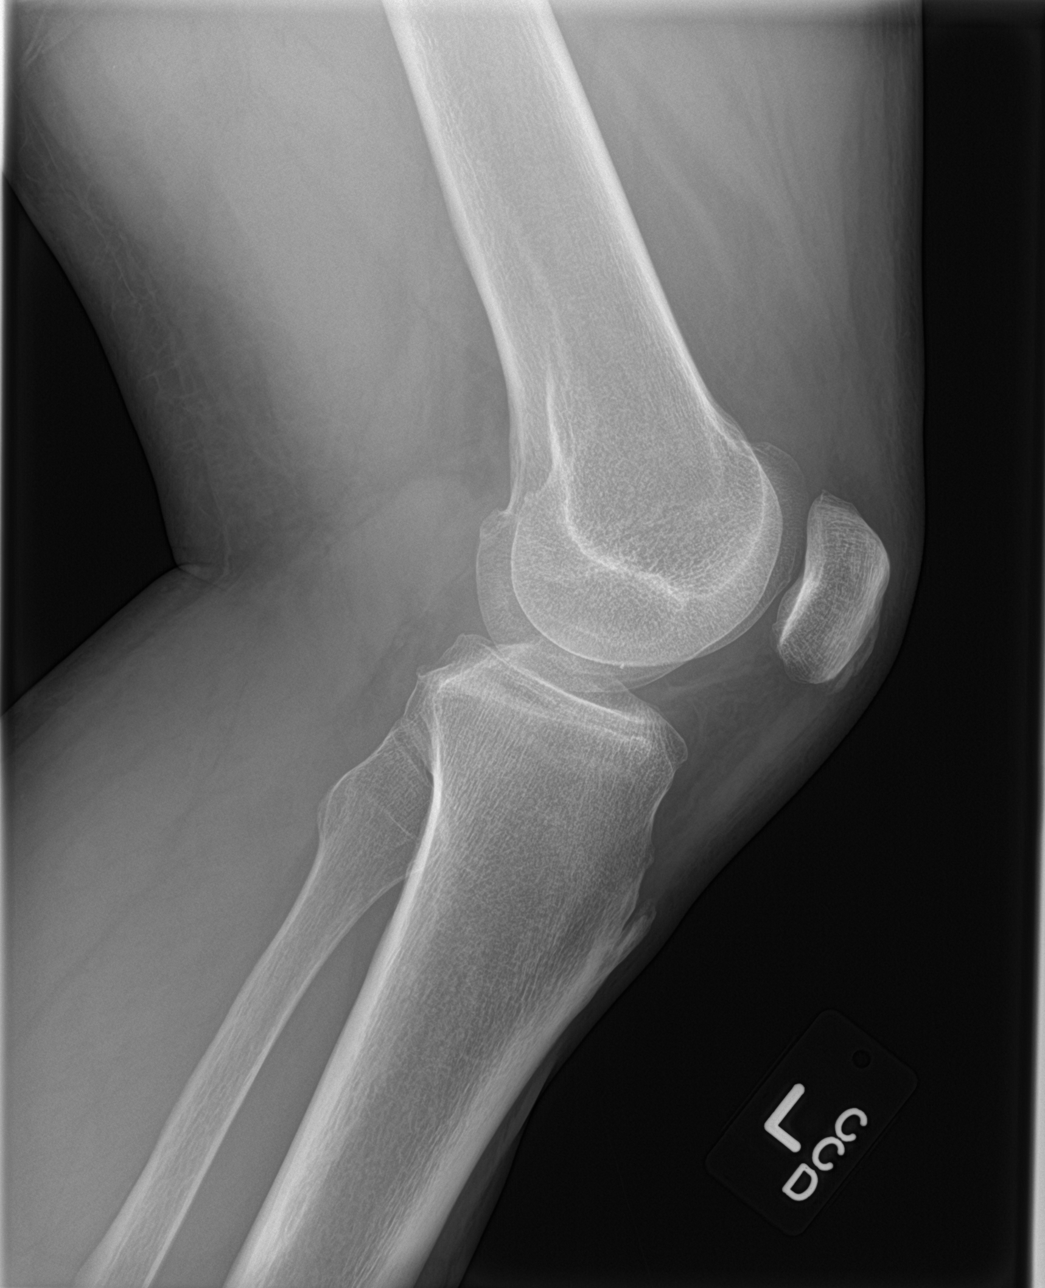

[knee obl (1 of 2)]
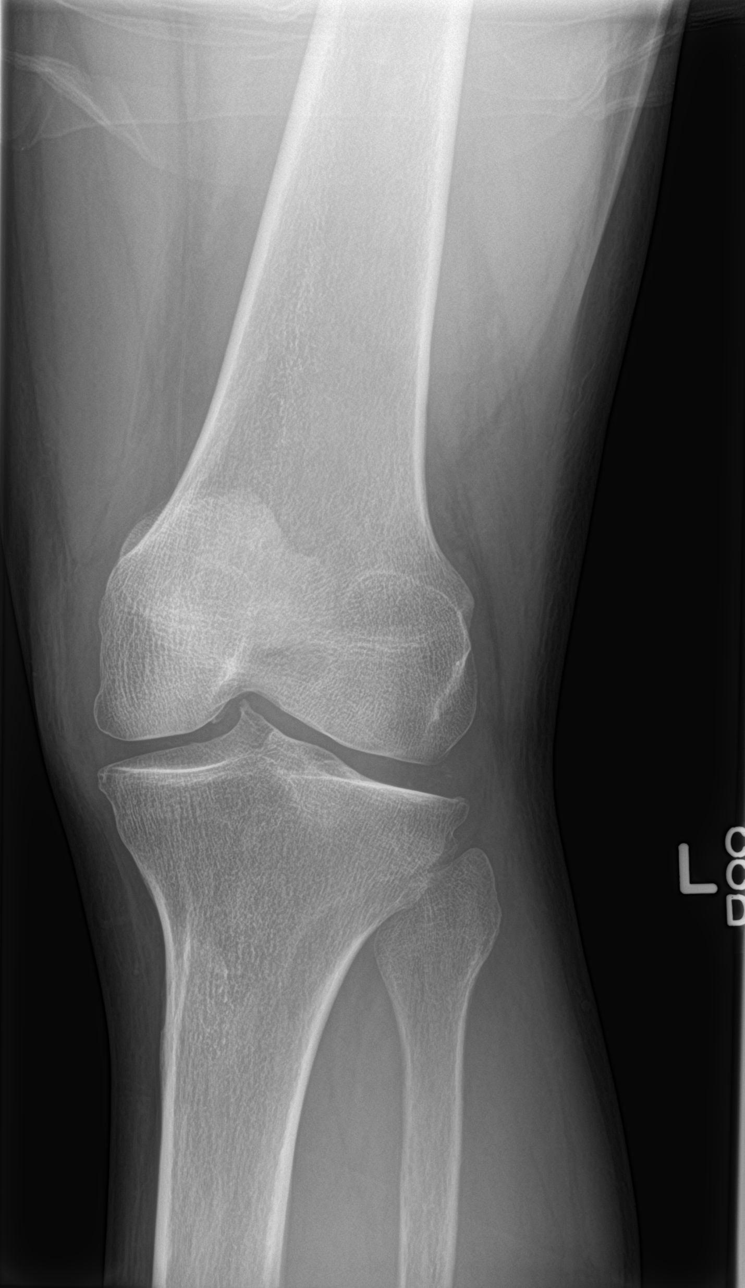

[knee obl (2 of 2)]
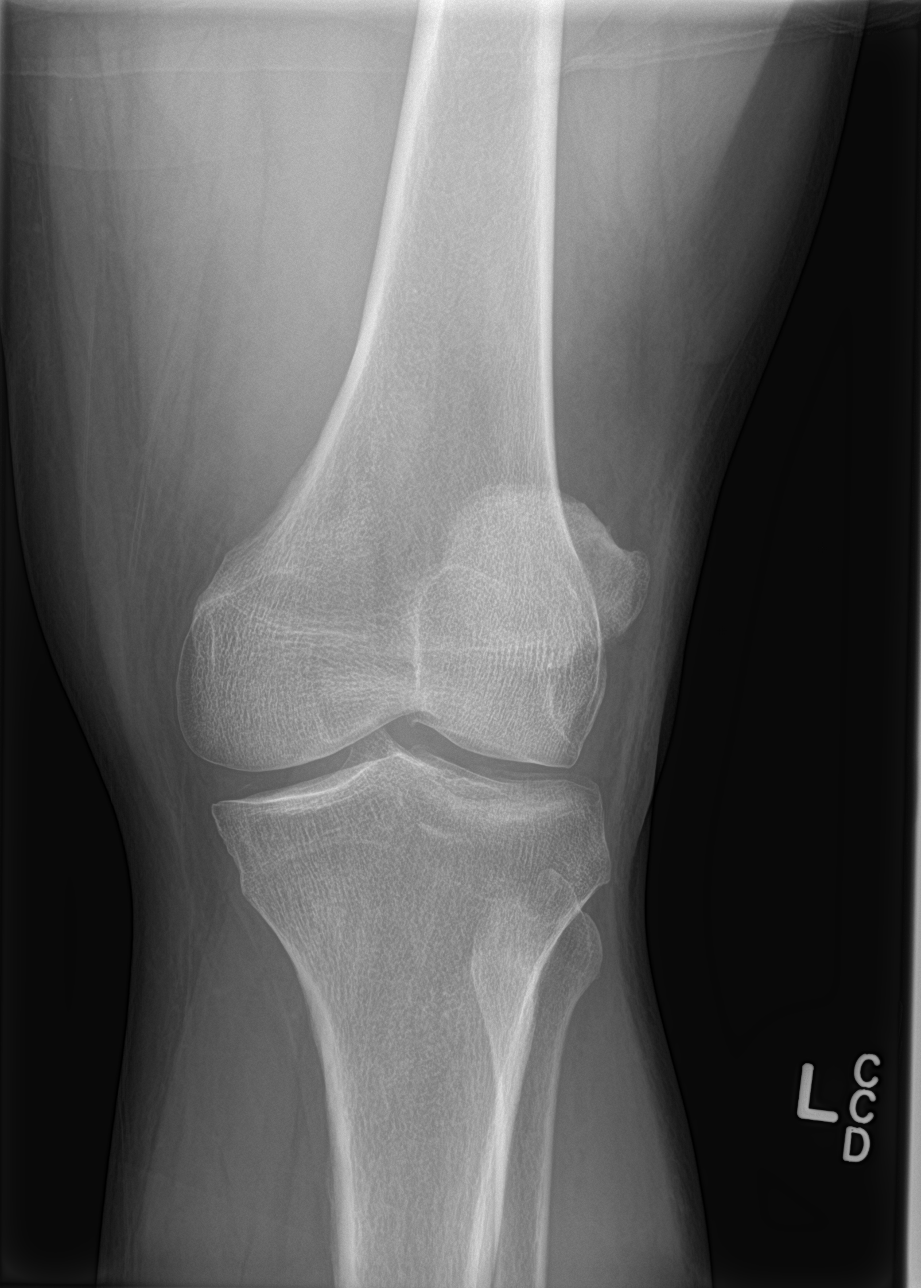

[4 of 4 positions shown; findings below may reference images not displayed]

FINDINGS: Early spurring in the left knee joint. Joint spaces are maintained.
No joint effusion. No acute bony abnormality. Specifically, no
fracture, subluxation, or dislocation. Soft tissues are intact.
IMPRESSION: No acute bony abnormality.

## 2017-01-20 MED ORDER — PREDNISONE 20 MG PO TABS: ORAL_TABLET | ORAL | 0 refills | Status: DC

## 2017-01-20 MED ORDER — AMLODIPINE BESYLATE 5 MG PO TABS: 10.0000 mg | ORAL_TABLET | Freq: Every day | ORAL | 3 refills | Status: DC

## 2017-01-20 MED ORDER — TRAMADOL HCL 50 MG PO TABS: 50.0000 mg | ORAL_TABLET | Freq: Three times a day (TID) | ORAL | 0 refills | Status: DC | PRN

## 2017-01-20 MED ORDER — LOSARTAN POTASSIUM 25 MG PO TABS
50.0000 mg | ORAL_TABLET | Freq: Every day | ORAL | 2 refills | Status: DC
Start: 2017-01-20 — End: 2017-06-07

## 2017-01-20 MED ORDER — CETIRIZINE HCL 10 MG PO TABS: 10.0000 mg | ORAL_TABLET | Freq: Every day | ORAL | 2 refills | Status: DC

## 2017-01-20 MED FILL — AMLODIPINE BESYLATE 5 MG TA: 5 | 30 days supply | Qty: 60 | Fill #0

## 2017-01-20 MED FILL — ?PREDNISONE 20MG TABLET: 20 | 9 days supply | Qty: 18 | Fill #0

## 2017-01-20 MED FILL — traMADol HCL 50 MG TABS: 50 | 10 days supply | Qty: 30 | Fill #0

## 2017-01-20 MED FILL — ?CETIRIZINE HCL 10 MG TABLE: 10 | 30 days supply | Qty: 30 | Fill #0
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
## 2017-01-20 NOTE — Telephone Encounter (Signed)
Called and spoke with patient. Advised that x ray shows some bone spurs in the left knee which could be the source of the pain. Advised to continue prednisone and that this should improve his symptoms. Advised that we will send to orthopedic once [facility] discount is approved. Thanks!

## 2017-01-20 NOTE — Patient Instructions (Addendum)
Increase amlodipine to 10 mg for hypertension.  Knee x-ray today in radiology department.   Cetirizine 10 mg at least 2 weeks for congestion.    Hypertension Hypertension is another name for high blood pressure. High blood pressure forces your heart to work harder to pump blood. This can cause problems over time. There are two numbers in a blood pressure reading. There is a top number (systolic) over a bottom number (diastolic). It is best to have a blood pressure below 120/80. Healthy choices can help lower your blood pressure. You may need medicine to help lower your blood pressure if:  Your blood pressure cannot be lowered with healthy choices.  Your blood pressure is higher than 130/80.  Follow these instructions at home: Eating and drinking  If directed, follow the DASH eating plan. This diet includes: ? Filling half of your plate at each meal with fruits and vegetables. ? Filling one quarter of your plate at each meal with whole grains. Whole grains include whole wheat pasta, brown rice, and whole grain bread. ? Eating or drinking low-fat dairy products, such as skim milk or low-fat yogurt. ? Filling one quarter of your plate at each meal with low-fat (lean) proteins. Low-fat proteins include fish, skinless chicken, eggs, beans, and tofu. ? Avoiding fatty meat, cured and processed meat, or chicken with skin. ? Avoiding premade or processed food.  Eat less than 1,500 mg of salt (sodium) a day.  Limit alcohol use to no more than 1 drink a day for nonpregnant women and 2 drinks a day for men. One drink equals 12 oz of beer, 5 oz of wine, or 1 oz of hard liquor. Lifestyle  Work with your doctor to stay at a healthy weight or to lose weight. Ask your doctor what the best weight is for you.  Get at least 30 minutes of exercise that causes your heart to beat faster (aerobic exercise) most days of the week. This may include walking, swimming, or biking.  Get at least 30 minutes of  exercise that strengthens your muscles (resistance exercise) at least 3 days a week. This may include lifting weights or pilates.  Do not use any products that contain nicotine or tobacco. This includes cigarettes and e-cigarettes. If you need help quitting, ask your doctor.  Check your blood pressure at home as told by your doctor.  Keep all follow-up visits as told by your doctor. This is important. Medicines  Take over-the-counter and prescription medicines only as told by your doctor. Follow directions carefully.  Do not skip doses of blood pressure medicine. The medicine does not work as well if you skip doses. Skipping doses also puts you at risk for problems.  Ask your doctor about side effects or reactions to medicines that you should watch for. Contact a doctor if:  You think you are having a reaction to the medicine you are taking.  You have headaches that keep coming back (recurring).  You feel dizzy.  You have swelling in your ankles.  You have trouble with your vision. Get help right away if:  You get a very bad headache.  You start to feel confused.  You feel weak or numb.  You feel faint.  You get very bad pain in your: ? Chest. ? Belly (abdomen).  You throw up (vomit) more than once.  You have trouble breathing. Summary  Hypertension is another name for high blood pressure.  Making healthy choices can help lower blood pressure. If your blood  pressure cannot be controlled with healthy choices, you may need to take medicine. This information is not intended to replace advice given to you by your health care provider. Make sure you discuss any questions you have with your health care provider. Document Released: 10/26/2007 Document Revised: 04/06/2016 Document Reviewed: 04/06/2016 Elsevier Interactive Patient Education  [employer].

## 2017-01-20 NOTE — Telephone Encounter (Signed)
-----   Message from Bing NeighborsKimberly S Harris, FNP sent at 01/20/2017  1:02 PM EDT ----- Imaging confirms early bone spurs (bony growth) of the left knee which could be the source of her pain. The prednisone in which he was prescribed should improve his symptoms. Ultimately, he will need an orthopedic referral once his [place] Patient Assistance application is completed.

## 2017-01-28 LAB — POCT GLYCOSYLATED HEMOGLOBIN (HGB A1C): HEMOGLOBIN A1C: 5.5

## 2017-02-25 ENCOUNTER — Encounter (HOSPITAL_COMMUNITY): Payer: Self-pay | Admitting: Emergency Medicine

## 2017-02-25 ENCOUNTER — Emergency Department (HOSPITAL_COMMUNITY): Payer: Self-pay

## 2017-02-25 ENCOUNTER — Emergency Department (HOSPITAL_COMMUNITY)
Admission: EM | Admit: 2017-02-25 | Discharge: 2017-02-25 | Disposition: A | Discharge status: Home / Self Care | Payer: Self-pay | Attending: Emergency Medicine | Admitting: Emergency Medicine

## 2017-02-25 DIAGNOSIS — Y939 Activity, unspecified: Secondary | ICD-10-CM | POA: Insufficient documentation

## 2017-02-25 DIAGNOSIS — Z79899 Other long term (current) drug therapy: Secondary | ICD-10-CM | POA: Insufficient documentation

## 2017-02-25 DIAGNOSIS — Y999 Unspecified external cause status: Secondary | ICD-10-CM | POA: Insufficient documentation

## 2017-02-25 DIAGNOSIS — G8929 Other chronic pain: Secondary | ICD-10-CM | POA: Insufficient documentation

## 2017-02-25 DIAGNOSIS — S63501A Unspecified sprain of right wrist, initial encounter: Secondary | ICD-10-CM | POA: Insufficient documentation

## 2017-02-25 DIAGNOSIS — K0889 Other specified disorders of teeth and supporting structures: Secondary | ICD-10-CM | POA: Insufficient documentation

## 2017-02-25 DIAGNOSIS — I10 Essential (primary) hypertension: Secondary | ICD-10-CM | POA: Insufficient documentation

## 2017-02-25 DIAGNOSIS — M25562 Pain in left knee: Secondary | ICD-10-CM | POA: Insufficient documentation

## 2017-02-25 DIAGNOSIS — X500XXA Overexertion from strenuous movement or load, initial encounter: Secondary | ICD-10-CM | POA: Insufficient documentation

## 2017-02-25 DIAGNOSIS — Y929 Unspecified place or not applicable: Secondary | ICD-10-CM | POA: Insufficient documentation

## 2017-02-25 IMAGING — CR DG WRIST COMPLETE 3+V*R*
4 series · 4 of 4 positions shown · non-contrast
Comparison: None.

CLINICAL DATA: Right wrist pain after reported lifting injury.

EXAM:
RIGHT WRIST - COMPLETE 3+ VIEW

[x wrist pa right]
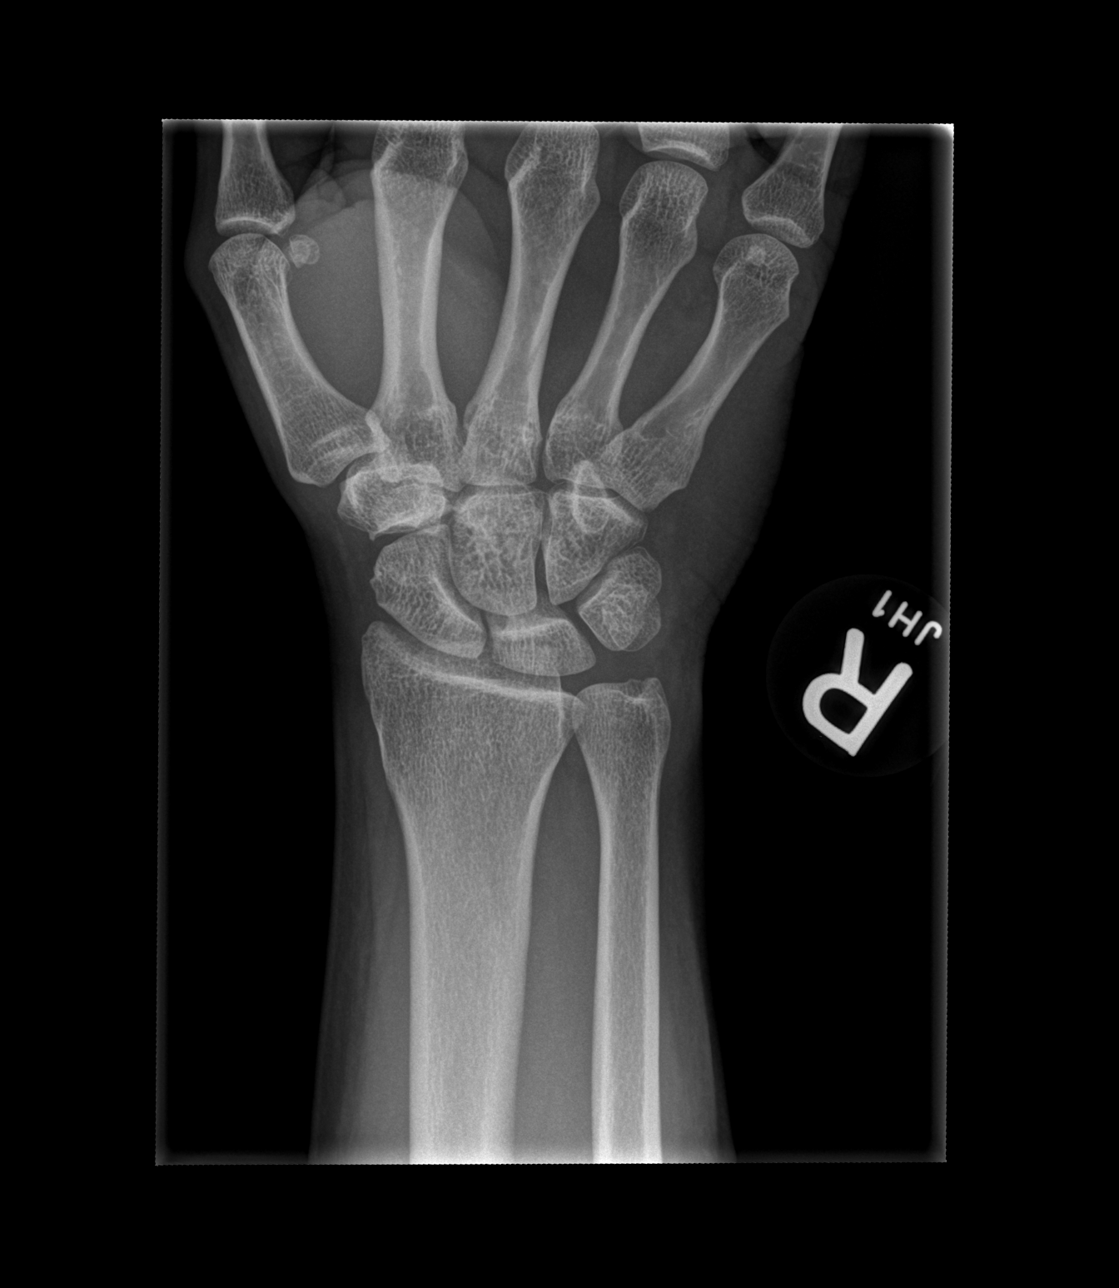

[x wrist obl right]
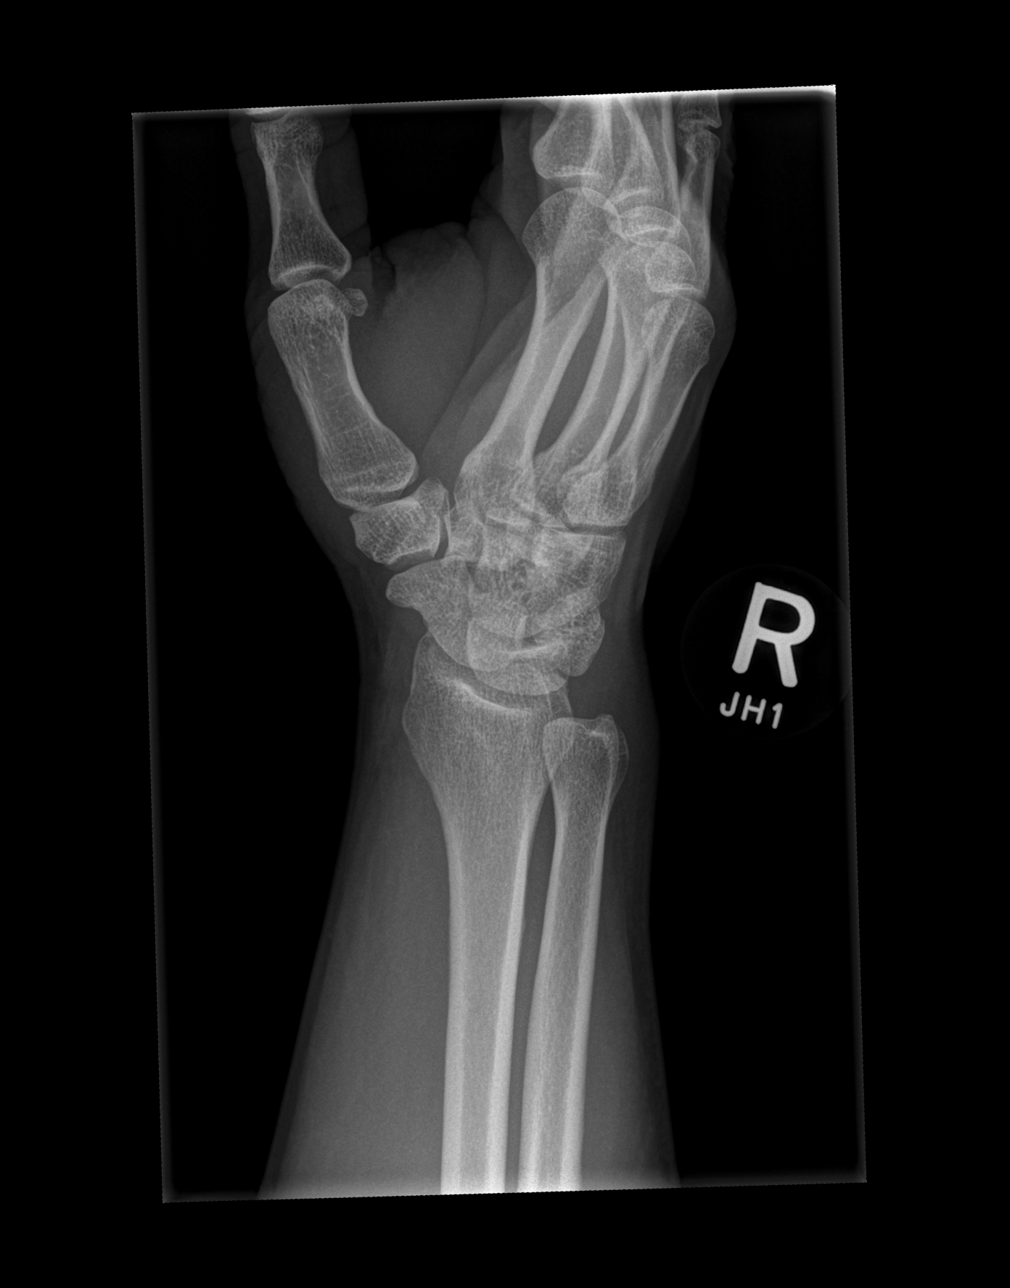

[x wrist lat right]
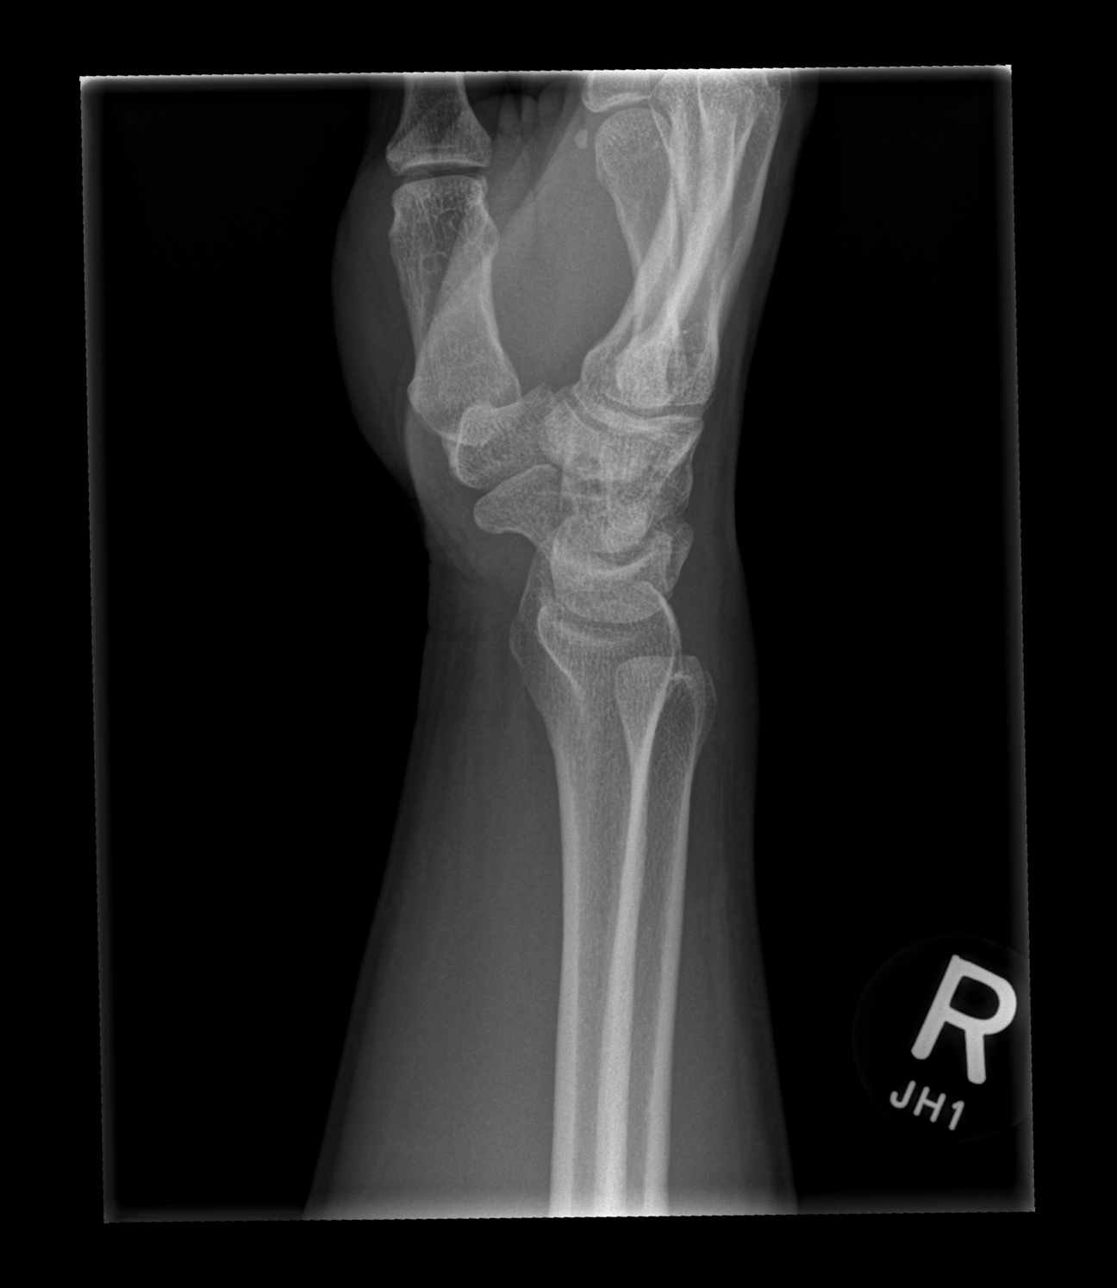

[x wrist navicular view right]
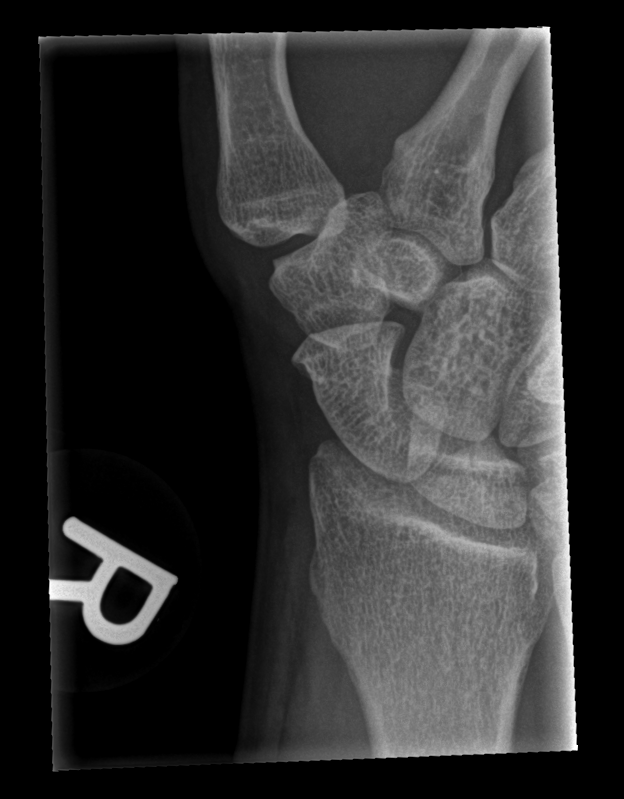

[4 of 4 positions shown; findings below may reference images not displayed]

FINDINGS: No fracture or dislocation. Minimal osteoarthritis at the scaphoid-
trapezoid articulation. No suspicious focal osseous lesion. No
radiopaque foreign body.
IMPRESSION: No fracture or dislocation in the right wrist. Minimal thenar sided
right wrist osteoarthritis.

## 2017-02-25 NOTE — ED Triage Notes (Signed)
Pt reports having ongoing pain in right wrist, left knee, and left lower dental pain. Pt reports to taking tramadol for pain with no relief.

## 2017-02-25 NOTE — ED Notes (Signed)
Bed: WA06 Expected date:  Expected time:  Means of arrival:  Comments: 

## 2017-02-25 NOTE — ED Notes (Signed)
PA at bedside.
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
## 2017-02-25 NOTE — Discharge Instructions (Signed)
Please read attached information. If you experience any new or worsening signs or symptoms please return to the emergency room for evaluation. Please follow-up with your primary care provider or specialist as discussed.  °

## 2017-03-06 ENCOUNTER — Telehealth: Payer: Self-pay

## 2017-03-06 NOTE — Telephone Encounter (Signed)
Pt called and states that he spoke with individuals at [facility] about financial concerns and per pt, help has been offered; requests an appt. For injury to wrist at work

## 2017-03-07 ENCOUNTER — Ambulatory Visit (INDEPENDENT_AMBULATORY_CARE_PROVIDER_SITE_OTHER): Payer: Self-pay | Admitting: Family Medicine

## 2017-03-07 ENCOUNTER — Encounter: Payer: Self-pay | Admitting: Family Medicine

## 2017-03-07 VITALS — BP 140/90 | HR 90 | Temp 98.1°F | Resp 16 | Ht 67.0 in | Wt 192.0 lb

## 2017-03-07 DIAGNOSIS — M25531 Pain in right wrist: Secondary | ICD-10-CM

## 2017-03-07 MED ORDER — TRAMADOL HCL 50 MG PO TABS: 50.0000 mg | ORAL_TABLET | Freq: Three times a day (TID) | ORAL | 0 refills | Status: DC | PRN

## 2017-03-07 MED ORDER — DICLOFENAC SODIUM 75 MG PO TBEC: 75.0000 mg | DELAYED_RELEASE_TABLET | Freq: Two times a day (BID) | ORAL | 0 refills | Status: AC

## 2017-03-07 MED FILL — ?DICLOFENAC SOD DR 75 MG TA: 75 | 14 days supply | Qty: 28 | Fill #0

## 2017-03-07 NOTE — Telephone Encounter (Signed)
Patient has a appointment for today

## 2017-03-07 NOTE — Patient Instructions (Addendum)
-I have prescribed diclofenac 75 mg twice daily for 14 days in order to decrease the inflammation in the wrist. -For acute pain I have prescribed tramadol for you to take 50 mg as needed every 8 hours. -Continue to wear her brace while working during the day and driving. Apply ice applications for 20 minute increments at least 3 times daily. -Complete the [employer] financial assistance or contact  financial assistance to determine what type of financial assistance you forwarded.    Wrist Sprain, Adult A wrist sprain is a stretch or tear in the strong, fibrous tissues (ligaments) that connect your wrist bones. There are three types of wrist sprains:  Grade 1. In this type of sprain, the ligament is stretched more than normal.  Grade 2. In this type of sprain, the ligament is partially torn. You may be able to move your wrist, but not very much.  Grade 3. In this type of sprain, the ligament or muscle is completely torn. You may find it difficult or extremely painful to move your wrist even a little.  What are the causes? A wrist sprain can be caused by using the wrist too much during sports, exercise, or at work. It can also happen with a fall or during an accident. What increases the risk? This condition is more likely to occur in people:  With a previous wrist or arm injury.  With poor wrist strength and flexibility.  Who play contact sports, such as football or soccer.  Who play sports that may result in a fall, such as skateboarding, biking, skiing, or snowboarding.  Who do not exercise regularly.  Who use exercise equipment that does not fit well.  What are the signs or symptoms? Symptoms of this condition include:  Pain in the wrist, arm, or hand.  Swelling or bruised skin near the wrist, hand, or arm. The skin may look yellow or kind of blue.  Stiffness or trouble moving the hand.  Hearing a pop or feeling a tear at the time of the injury.  A warm  feeling in the skin around the wrist.  How is this diagnosed? This condition is diagnosed with a physical exam. Sometimes an X-ray is taken to make sure a bone did not break. If your health care provider thinks that you tore a ligament, he or she may order an MRI of your wrist. How is this treated? This condition is treated by resting and applying ice to your wrist. Additional treatment may include:  Medicine for pain and inflammation.  A splint to keep your wrist still (immobilized).  Exercises to strengthen and stretch your wrist.  Surgery. This may be done if the ligament is completely torn.  Follow these instructions at home: If you have a splint:   Do not put pressure on any part of the splint until it is fully hardened. This may take several hours.  Wear the splint as told by your health care provider. Remove it only as told by your health care provider.  Loosen the splint if your fingers tingle, become numb, or turn cold and blue.  If your splint is not waterproof: ? Do not let it get wet. ? Cover it with a watertight covering when you take a bath or a shower.  Keep the splint clean. Managing pain, stiffness, and swelling   If directed, put ice on the injured area. ? If you have a removable splint, remove it as told by your health care provider. ? Put  ice in a plastic bag. ? Place a towel between your skin and the bag or between the splint and the bag. ? Leave the ice on for 20 minutes, 2-3 times per day.  Move your fingers often to avoid stiffness and to lessen swelling.  Raise (elevate) the injured area above the level of your heart while you are sitting or lying down. Activity  Rest your wrist. Do not do things that cause pain.  Return to your normal activities as told by your health care provider. Ask your health care provider what activities are safe for you.  Do exercises as told by your health care provider. General instructions  Take over-the-counter  and prescription medicines only as told by your health care provider.  Do not use any products that contain nicotine or tobacco, such as cigarettes and e-cigarettes. These can delay healing. If you need help quitting, ask your health care provider.  Ask your health care provider when it is safe to drive if you have a splint.  Keep all follow-up visits as told by your health care provider. This is important. Contact a health care provider if:  Your pain, bruising, or swelling gets worse.  Your skin becomes red, gets a rash, or has open sores.  Your pain does not get better or it gets worse. Get help right away if:  You have a new or sudden sharp pain in the hand, arm, or wrist.  You have tingling or numbness in your hand.  Your fingers turn white, very red, or cold and blue.  You cannot move your fingers. This information is not intended to replace advice given to you by your health care provider. Make sure you discuss any questions you have with your health care provider. Document Released: 01/10/2014 Document Revised: 12/05/2015 Document Reviewed: 11/26/2015 Elsevier Interactive Patient Education  2017 [employer].

## 2017-03-07 NOTE — Progress Notes (Signed)
Patient ID: Michael Fleming, male    DOB: [birth year], 46 y.o.   MRN: [id number]  PCP: Bing Neighbors, FNP  Chief Complaint  Patient presents with  . Wrist Pain    RIGHT    Subjective:  HPI-Acute  [name] is a 46 y.o. male presents for evaluation of right wrist pain.  Patient reports that he experienced right wrist pain after lifting something very heavy.  He reports an aching throbbing sensation.  He has purchased a wrist brace however has not consistently worn it.  [name] reports that he has not taken any over-the-counter pain medication as he tried to apply ice applications.  Reports associated right digit numbness since experiencing this injury. Social History   Social History  . Marital status: Single    Spouse name: N/A  . Number of children: N/A  . Years of education: N/A   Occupational History  . Not on file.   Social History Main Topics  . Smoking status: Never Smoker  . Smokeless tobacco: Never Used  . Alcohol use Yes     Comment: occasional  . Drug use: No  . Sexual activity: Not on file   Other Topics Concern  . Not on file   Social History Narrative  . No narrative on file    Family History  Problem Relation Age of Onset  . Family history unknown: Yes   Review of Systems See HPI Patient Active Problem List   Diagnosis Date Noted  . HTN (hypertension) 10/23/2016  . History of brain surgery 10/23/2016  . Chronic headache 10/23/2016    No Known Allergies  Prior to Admission medications   Medication Sig Start Date End Date Taking? Authorizing Provider  amLODipine (NORVASC) 5 MG tablet Take 2 tablets (10 mg total) by mouth daily. 01/20/17  Yes Bing Neighbors, FNP  cetirizine (ZYRTEC) 10 MG tablet Take 1 tablet (10 mg total) by mouth at bedtime. 01/20/17  Yes Bing Neighbors, FNP  losartan (COZAAR) 25 MG tablet Take 2 tablets (50 mg total) by mouth daily. 01/20/17  Yes Bing Neighbors, FNP  predniSONE (DELTASONE) 20 MG tablet Take 3 PO  QAM x3days, 2 PO QAM x3days, 1 PO QAM x3days Patient not taking: Reported on 03/07/2017 01/20/17   Bing Neighbors, FNP  traMADol (ULTRAM) 50 MG tablet Take 1 tablet (50 mg total) by mouth every 8 (eight) hours as needed. Patient not taking: Reported on 03/07/2017 01/20/17   Bing Neighbors, FNP    Past Medical, Surgical Family and Social History reviewed and updated.    Objective:   Today's Vitals   03/07/17 1555  BP: 140/90  Pulse: 90  Resp: 16  Temp: 98.1 F (36.7 C)  TempSrc: Oral  SpO2: 100%  Weight: 192 lb (87.1 kg)  Height:  (1.702 m)    Wt Readings from Last 3 Encounters:  03/07/17 192 lb (87.1 kg)  02/25/17 189 lb (85.7 kg)  01/20/17 189 lb 9.6 oz (86 kg)    Physical Exam  Constitutional: He is oriented to person, place, and time. He appears well-developed and well-nourished.  HENT:  Head: Normocephalic and atraumatic.  Eyes: Pupils are equal, round, and reactive to light. Conjunctivae and EOM are normal.  Cardiovascular: Normal rate, regular rhythm, normal heart sounds and intact distal pulses.   Pulmonary/Chest: Effort normal and breath sounds normal.  Musculoskeletal:       Right wrist: He exhibits bony tenderness. He exhibits normal range of motion,  no swelling and no crepitus.  Neurological: He is alert and oriented to person, place, and time.  Skin: Skin is warm and dry.  Psychiatric: His behavior is normal. Judgment and thought content normal.   Assessment & Plan:  1. Right wrist pain, acute pain likely related to wrist strain. Will trial diclofenac 5 mg twice daily times 14 days.  For acute pain tramadol 50 mg as needed every 8 hours.  Continue to wear her brace when working or driving.  Apply ice applications in 20-minute increments at least 3 times daily.    Apply for [facility] financial assistance in the event that you need further evaluation by orthopedic specialist.  Return for care if symptoms worsen or do not improve.   Godfrey Pick. Tiburcio Pea, MSN, FNP-C [facility]  [address] [place], [place] [address] [phone]

## 2017-03-10 MED FILL — traMADol HCL 50 MG TABS: 50 | 10 days supply | Qty: 30 | Fill #0

## 2017-03-15 ENCOUNTER — Ambulatory Visit: Payer: Self-pay | Attending: Internal Medicine

## 2017-03-21 ENCOUNTER — Ambulatory Visit (INDEPENDENT_AMBULATORY_CARE_PROVIDER_SITE_OTHER): Payer: Self-pay | Admitting: Orthopaedic Surgery

## 2017-03-27 ENCOUNTER — Ambulatory Visit (INDEPENDENT_AMBULATORY_CARE_PROVIDER_SITE_OTHER): Payer: Self-pay | Admitting: Orthopaedic Surgery

## 2017-03-27 DIAGNOSIS — G8929 Other chronic pain: Secondary | ICD-10-CM | POA: Insufficient documentation

## 2017-03-27 DIAGNOSIS — M25531 Pain in right wrist: Secondary | ICD-10-CM | POA: Insufficient documentation

## 2017-03-27 DIAGNOSIS — M25562 Pain in left knee: Secondary | ICD-10-CM | POA: Insufficient documentation

## 2017-03-27 NOTE — Progress Notes (Signed)
Office Visit Note   Patient: Michael Fleming           Date of Birth: Jul 05, 1970           MRN: [id number] Visit Date: 03/27/2017              Requested by: Bing NeighborsHarris, Kimberly S, FNP [address] [place], [place] [id number] PCP: Bing NeighborsHarris, Kimberly S, FNP   Assessment & Plan: Visit Diagnoses:  1. Chronic pain of left knee   2. Pain in right wrist     Plan: Impression is right wrist pain and left knee pain.  Recommend MRI of the right wrist and left knee to rule out structural abnormality given failure of conservative treatment.  Patient may have the possibility of gout also.  If MRI findings are unremarkable may need to obtain uric acid level. Total face to face encounter time was greater than 45 minutes and over half of this time was spent in counseling and/or coordination of care.  Follow-Up Instructions: Return in about 10 days (around 04/06/2017).   Orders:  No orders of the defined types were placed in this encounter.  No orders of the defined types were placed in this encounter.     Procedures: No procedures performed   Clinical Data: No additional findings.   Subjective: No chief complaint on file.   Patient is a pleasant 46 year old gentleman who comes in with right wrist and left knee pain.  He has been seeing his PCP for the last year for this.  He has taken diclofenac without any significant relief.  He endorses ulnar-sided wrist pain is worse with use and activity.  He also endorses left knee pain that is worse with activity.  There is no radiation of pain.  Denies any numbness and tingling.  Denies any swelling.    Review of Systems  Constitutional: Negative.   All other systems reviewed and are negative.    Objective: Vital Signs: There were no vitals taken for this visit.  Physical Exam  Constitutional: He is oriented to person, place, and time. He appears well-developed and well-nourished.  HENT:  Head: Normocephalic and atraumatic.  Eyes: Pupils  are equal, round, and reactive to light.  Neck: Neck supple.  Pulmonary/Chest: Effort normal.  Abdominal: Soft.  Musculoskeletal: Normal range of motion.  Neurological: He is alert and oriented to person, place, and time.  Skin: Skin is warm.  Psychiatric: He has a normal mood and affect. His behavior is normal. Judgment and thought content normal.  Nursing note and vitals reviewed.   Ortho Exam Right wrist exam shows tenderness along the ECU tendon.  ECU tendon is stable.  TFCC is not significantly tender.  Normal range of motion.  Left knee exam shows no joint effusion.  No significant joint line tenderness.  Collaterals and cruciates are stable. Specialty Comments:  No specialty comments available.  Imaging: No results found.   PMFS History: Patient Active Problem List   Diagnosis Date Noted  . Pain in right wrist 03/27/2017  . Chronic pain of left knee 03/27/2017  . HTN (hypertension) 10/23/2016  . History of brain surgery 10/23/2016  . Chronic headache 10/23/2016   Past Medical History:  Diagnosis Date  . Seizures (HCC)    last seizure age 46    Family History  Family history unknown: Yes    Past Surgical History:  Procedure Laterality Date  . BRAIN SURGERY     1981 blot clot removed  . FOOT SURGERY  right bunion removed   Social History   Occupational History  . Not on file  Tobacco Use  . Smoking status: Never Smoker  . Smokeless tobacco: Never Used  Substance and Sexual Activity  . Alcohol use: Yes    Comment: occasional  . Drug use: No  . Sexual activity: Not on file

## 2017-03-28 ENCOUNTER — Encounter (INDEPENDENT_AMBULATORY_CARE_PROVIDER_SITE_OTHER): Payer: Self-pay | Admitting: Orthopaedic Surgery

## 2017-03-28 NOTE — Addendum Note (Signed)
Addended by: Albertina ParrGARCIA, Glenford Garis on: 03/28/2017 10:21 AM   Modules accepted: Orders

## 2017-04-05 ENCOUNTER — Inpatient Hospital Stay: Admission: RE | Admit: 2017-04-05 | Payer: Self-pay | Source: Ambulatory Visit

## 2017-04-05 ENCOUNTER — Other Ambulatory Visit: Payer: Self-pay

## 2017-04-06 ENCOUNTER — Ambulatory Visit (INDEPENDENT_AMBULATORY_CARE_PROVIDER_SITE_OTHER): Payer: Self-pay | Admitting: Orthopaedic Surgery

## 2017-04-15 ENCOUNTER — Ambulatory Visit
Admission: RE | Admit: 2017-04-15 | Discharge: 2017-04-15 | Disposition: A | Discharge status: Home / Self Care | Payer: No Typology Code available for payment source | Source: Ambulatory Visit | Attending: Orthopaedic Surgery | Admitting: Orthopaedic Surgery

## 2017-04-15 DIAGNOSIS — M25562 Pain in left knee: Principal | ICD-10-CM

## 2017-04-15 DIAGNOSIS — M25531 Pain in right wrist: Secondary | ICD-10-CM

## 2017-04-15 DIAGNOSIS — G8929 Other chronic pain: Secondary | ICD-10-CM

## 2017-04-18 ENCOUNTER — Telehealth: Payer: Self-pay

## 2017-04-18 ENCOUNTER — Ambulatory Visit (INDEPENDENT_AMBULATORY_CARE_PROVIDER_SITE_OTHER): Payer: Self-pay | Admitting: Orthopaedic Surgery

## 2017-04-18 ENCOUNTER — Telehealth (INDEPENDENT_AMBULATORY_CARE_PROVIDER_SITE_OTHER): Payer: Self-pay | Admitting: Orthopaedic Surgery

## 2017-04-18 NOTE — Telephone Encounter (Signed)
CT arthrogram of the wrist and knee

## 2017-04-18 NOTE — Telephone Encounter (Signed)
See message below °

## 2017-04-18 NOTE — Telephone Encounter (Signed)
Patient came into the clinic for his MRI Review appointment with Dr. Roda ShuttersXU.  Patient stated that GI denied him for the MRI I spoke with Drenda FreezeFran from GI and she stated that they can not do his MRI due to clips in his brain that was not cleared for MRI from back in 2012.  Patient was asked to find out who the doctor was that did the surgery, but patient can not remember who it was. What does he need to do from here. [phone].  Thank you.

## 2017-04-19 ENCOUNTER — Other Ambulatory Visit (INDEPENDENT_AMBULATORY_CARE_PROVIDER_SITE_OTHER): Payer: Self-pay

## 2017-04-19 DIAGNOSIS — M25531 Pain in right wrist: Secondary | ICD-10-CM

## 2017-04-19 DIAGNOSIS — G8929 Other chronic pain: Secondary | ICD-10-CM

## 2017-04-19 DIAGNOSIS — M25562 Pain in left knee: Principal | ICD-10-CM

## 2017-04-19 NOTE — Telephone Encounter (Signed)
[facility]

## 2017-04-19 NOTE — Telephone Encounter (Signed)
Orders made. They willl call him to schedule CT (2)

## 2017-04-20 NOTE — Telephone Encounter (Signed)
[facility]

## 2017-04-21 ENCOUNTER — Ambulatory Visit: Payer: Self-pay | Admitting: Family Medicine

## 2017-04-26 MED FILL — LOSARTAN POTASSIUM 25 MG TA: 25 | 30 days supply | Qty: 60 | Fill #0

## 2017-04-26 MED FILL — AMLODIPINE BESYLATE 5 MG TA: 5 | 30 days supply | Qty: 60 | Fill #0

## 2017-05-05 MED FILL — ?CETIRIZINE HCL 10 MG TABLE: 10 | 30 days supply | Qty: 30 | Fill #1

## 2017-05-05 MED FILL — IBUPROFEN 800 MG TABLET: 800 | 5 days supply | Qty: 20 | Fill #0

## 2017-05-09 ENCOUNTER — Ambulatory Visit (HOSPITAL_COMMUNITY)
Admission: RE | Admit: 2017-05-09 | Discharge: 2017-05-09 | Disposition: A | Discharge status: Home / Self Care | Payer: Self-pay | Source: Ambulatory Visit | Attending: Orthopaedic Surgery | Admitting: Orthopaedic Surgery

## 2017-05-09 ENCOUNTER — Telehealth (INDEPENDENT_AMBULATORY_CARE_PROVIDER_SITE_OTHER): Payer: Self-pay | Admitting: Orthopaedic Surgery

## 2017-05-09 DIAGNOSIS — G8929 Other chronic pain: Secondary | ICD-10-CM

## 2017-05-09 DIAGNOSIS — M25562 Pain in left knee: Principal | ICD-10-CM

## 2017-05-09 DIAGNOSIS — M25531 Pain in right wrist: Secondary | ICD-10-CM

## 2017-05-09 NOTE — Telephone Encounter (Signed)
Toni AmendCourtney from [facility] is needing to know if the patient really needs an Arthrogram, if yes, it will have to be rescheduled because there is part of the order missing. [phone].  Thank you.

## 2017-05-09 NOTE — Telephone Encounter (Signed)
See message.

## 2017-05-09 NOTE — Telephone Encounter (Signed)
Arthrogram of wrist, not knee

## 2017-05-10 ENCOUNTER — Other Ambulatory Visit (INDEPENDENT_AMBULATORY_CARE_PROVIDER_SITE_OTHER): Payer: Self-pay | Admitting: Orthopaedic Surgery

## 2017-05-10 DIAGNOSIS — M25531 Pain in right wrist: Secondary | ICD-10-CM

## 2017-05-10 NOTE — Telephone Encounter (Signed)
yes

## 2017-05-10 NOTE — Telephone Encounter (Signed)
Just to clarify orders, you wanted a CT left knee w/o, and a CT Arthrogram right wrist? Since patient cannot have MRI scans.

## 2017-05-10 NOTE — Telephone Encounter (Signed)
Noted and order(s) has  Been changed

## 2017-05-10 NOTE — Telephone Encounter (Signed)
To you to amend/add orders, thanks.

## 2017-05-11 ENCOUNTER — Other Ambulatory Visit (INDEPENDENT_AMBULATORY_CARE_PROVIDER_SITE_OTHER): Payer: Self-pay | Admitting: Orthopaedic Surgery

## 2017-05-11 ENCOUNTER — Ambulatory Visit (INDEPENDENT_AMBULATORY_CARE_PROVIDER_SITE_OTHER): Payer: Self-pay | Admitting: Orthopaedic Surgery

## 2017-05-11 DIAGNOSIS — G8929 Other chronic pain: Secondary | ICD-10-CM

## 2017-05-11 DIAGNOSIS — M25562 Pain in left knee: Principal | ICD-10-CM

## 2017-05-19 ENCOUNTER — Other Ambulatory Visit: Payer: Self-pay

## 2017-05-19 ENCOUNTER — Encounter (HOSPITAL_BASED_OUTPATIENT_CLINIC_OR_DEPARTMENT_OTHER): Payer: Self-pay

## 2017-05-19 ENCOUNTER — Emergency Department (HOSPITAL_BASED_OUTPATIENT_CLINIC_OR_DEPARTMENT_OTHER): Payer: Self-pay

## 2017-05-19 ENCOUNTER — Emergency Department (HOSPITAL_BASED_OUTPATIENT_CLINIC_OR_DEPARTMENT_OTHER)
Admission: EM | Admit: 2017-05-19 | Discharge: 2017-05-19 | Disposition: A | Discharge status: Home / Self Care | Payer: Self-pay | Attending: Emergency Medicine | Admitting: Emergency Medicine

## 2017-05-19 DIAGNOSIS — R69 Illness, unspecified: Secondary | ICD-10-CM

## 2017-05-19 DIAGNOSIS — R5383 Other fatigue: Secondary | ICD-10-CM | POA: Insufficient documentation

## 2017-05-19 DIAGNOSIS — R05 Cough: Secondary | ICD-10-CM | POA: Insufficient documentation

## 2017-05-19 DIAGNOSIS — I10 Essential (primary) hypertension: Secondary | ICD-10-CM | POA: Insufficient documentation

## 2017-05-19 DIAGNOSIS — J111 Influenza due to unidentified influenza virus with other respiratory manifestations: Secondary | ICD-10-CM

## 2017-05-19 DIAGNOSIS — Z79899 Other long term (current) drug therapy: Secondary | ICD-10-CM | POA: Insufficient documentation

## 2017-05-19 DIAGNOSIS — J069 Acute upper respiratory infection, unspecified: Secondary | ICD-10-CM | POA: Insufficient documentation

## 2017-05-19 DIAGNOSIS — R0789 Other chest pain: Secondary | ICD-10-CM | POA: Insufficient documentation

## 2017-05-19 DIAGNOSIS — R6883 Chills (without fever): Secondary | ICD-10-CM | POA: Insufficient documentation

## 2017-05-19 HISTORY — DX: Essential (primary) hypertension: I10

## 2017-05-19 IMAGING — DX DG CHEST 2V
2 series · 2 of 2 positions shown · non-contrast
Comparison: Chest radiograph dated [DATE]

CLINICAL DATA: 46-year-old male with cough and congestion.

EXAM:
CHEST  2 VIEW

[chest pa]
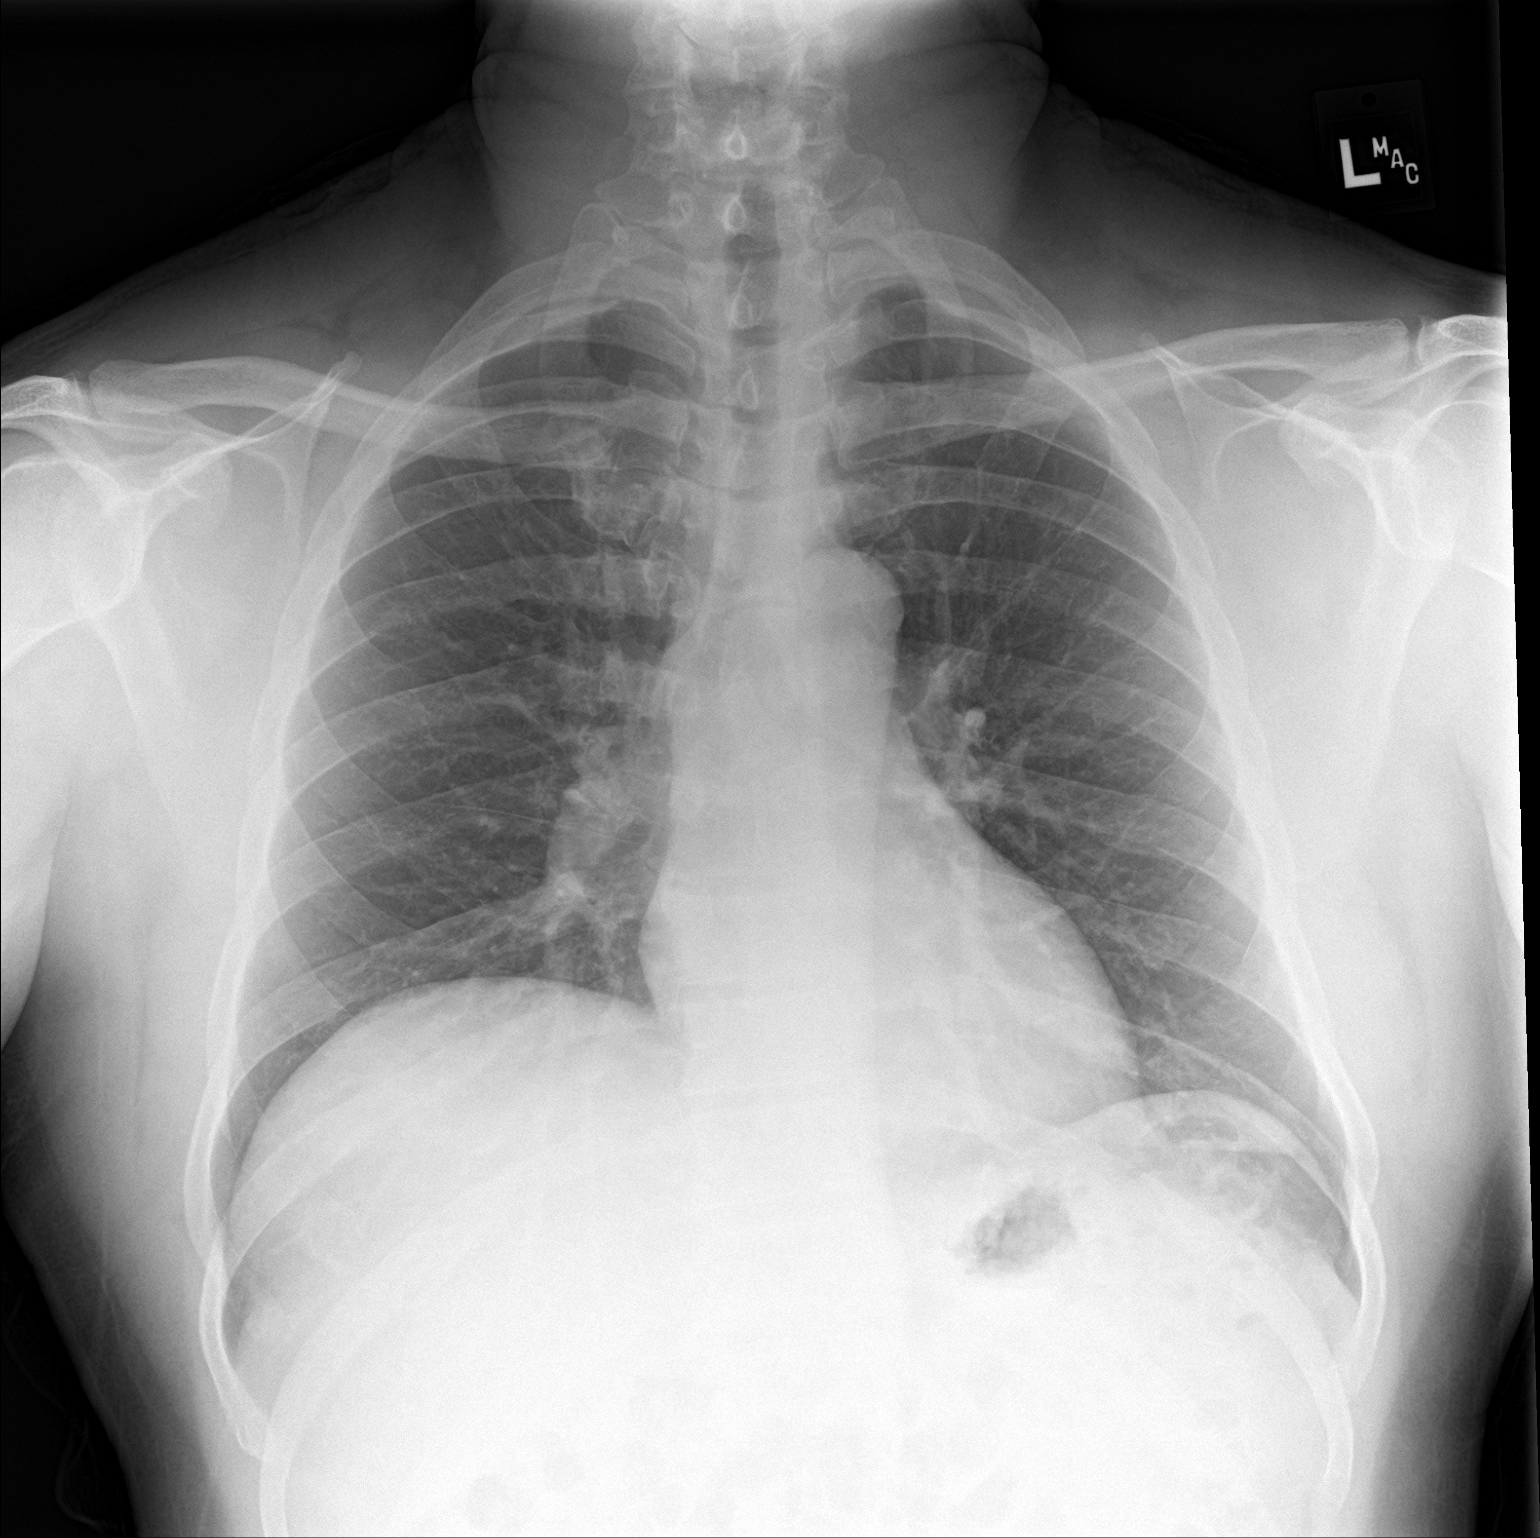

[chest lat]
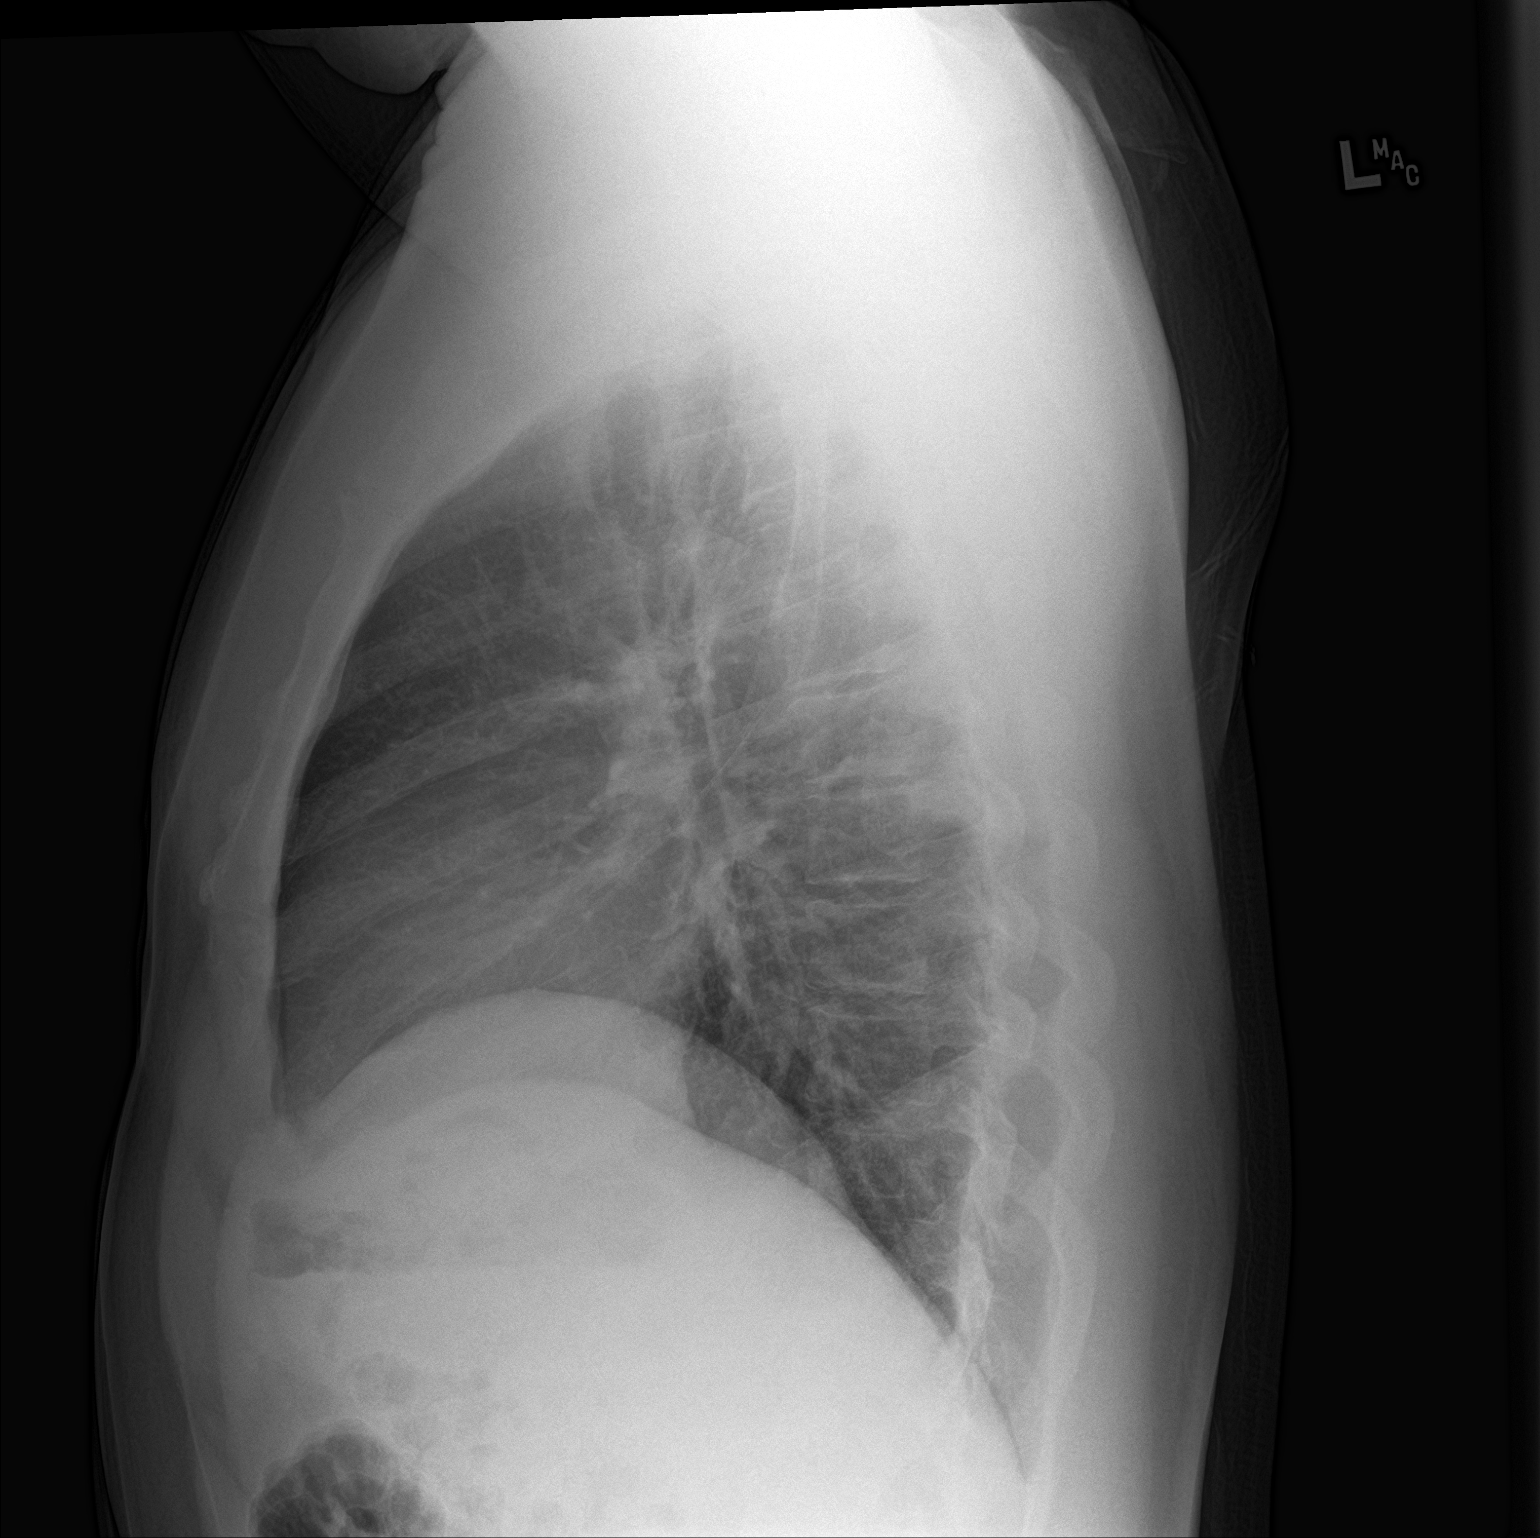

[2 of 2 positions shown; findings below may reference images not displayed]

FINDINGS: The heart size and mediastinal contours are within normal limits.
Both lungs are clear. The visualized skeletal structures are
unremarkable.
IMPRESSION: No active cardiopulmonary disease.

## 2017-05-19 MED ORDER — HYDROCOD POLST-CPM POLST ER 10-8 MG/5ML PO SUER
5.0000 mL | Freq: Once | ORAL | Status: AC
  Administered 2017-05-19: 5 mL via ORAL
  Filled 2017-05-19: qty 5

## 2017-05-19 MED ORDER — HYDROCOD POLST-CPM POLST ER 10-8 MG/5ML PO SUER: 5.0000 mL | Freq: Two times a day (BID) | ORAL | 0 refills | Status: DC | PRN

## 2017-05-19 MED FILL — HYDROCODONE-CHLORPHENIRAM S: 10-8 | 7 days supply | Qty: 70 | Fill #0

## 2017-05-19 NOTE — ED Provider Notes (Signed)
[facility] Provider Note: Michael DellJ. Lane Trinetta Alemu, MD, FACEP  CSN: [id number] MRN: [id number] ARRIVAL: 05/19/17 at 0152 ROOM: MH05/[facility]05   CHIEF COMPLAINT  URI   HISTORY OF PRESENT ILLNESS  05/19/17 4:05 AM Michael Fleming is a 46 y.o. male with a 2-day history of flulike symptoms.  Specifically he has had body aches, chills, night sweats, nasal congestion, sore throat, coughing and general malaise.  He denies nausea, vomiting or diarrhea.  He is also having chest pain when he coughs.  He states right now his worst symptom is his sore throat which he rates as a 9 out of 10.  He has been taking Tylenol Cold and Flu without adequate relief of his symptoms.  His last dose was at 5 PM yesterday evening.   Past Medical History:  Diagnosis Date  . Hypertension   . Seizures (HCC)    last seizure age 46    Past Surgical History:  Procedure Laterality Date  . BRAIN SURGERY     1981 blot clot removed  . FOOT SURGERY     right bunion removed    Family History  Family history unknown: Yes    Social History   Tobacco Use  . Smoking status: Never Smoker  . Smokeless tobacco: Never Used  Substance Use Topics  . Alcohol use: Yes    Comment: occasional  . Drug use: No    Prior to Admission medications   Medication Sig Start Date End Date Taking? Authorizing Provider  amLODipine (NORVASC) 5 MG tablet Take 2 tablets (10 mg total) by mouth daily. 01/20/17  Yes Bing NeighborsHarris, Kimberly S, FNP  losartan (COZAAR) 25 MG tablet Take 2 tablets (50 mg total) by mouth daily. 01/20/17  Yes Bing NeighborsHarris, Kimberly S, FNP  cetirizine (ZYRTEC) 10 MG tablet Take 1 tablet (10 mg total) by mouth at bedtime. Patient not taking: Reported on 05/19/2017 01/20/17   Bing NeighborsHarris, Kimberly S, FNP  predniSONE (DELTASONE) 20 MG tablet Take 3 PO QAM x3days, 2 PO QAM x3days, 1 PO QAM x3days Patient not taking: Reported on 03/07/2017 01/20/17   Bing NeighborsHarris, Kimberly S, FNP  traMADol (ULTRAM) 50 MG tablet Take 1 tablet (50 mg total)  by mouth every 8 (eight) hours as needed. Patient not taking: Reported on 05/19/2017 03/07/17   Bing NeighborsHarris, Kimberly S, FNP    Allergies Patient has no known allergies.   REVIEW OF SYSTEMS  Negative except as noted here or in the History of Present Illness.   PHYSICAL EXAMINATION  Initial Vital Signs Blood pressure (!) 133/96, pulse 82, temperature 98.1 F (36.7 C), temperature source Oral, resp. rate 18, height 5\' 7"  (1.702 m), weight 93 kg (205 lb), SpO2 100 %.  Examination General: Well-developed, well-nourished male in no acute distress; appearance consistent with age of record HENT: normocephalic; atraumatic; nasal congestion; mild pharyngeal erythema without exudate Eyes: pupils equal, round and reactive to light; extraocular muscles intact Neck: supple Heart: regular rate and rhythm Lungs: clear to auscultation bilaterally Abdomen: soft; nondistended; nontender; bowel sounds present Extremities: No deformity; full range of motion; pulses normal Neurologic: Awake, alert and oriented; motor function intact in all extremities and symmetric; no facial droop Skin: Warm and dry Psychiatric: Normal mood and affect   RESULTS  Summary of this visit's results, reviewed by myself:   EKG Interpretation  Date/Time:    Ventricular Rate:    PR Interval:    QRS Duration:   QT Interval:    QTC Calculation:   R Axis:  Text Interpretation:        Laboratory Studies: No results found for this or any previous visit (from the past 24 hour(s)). Imaging Studies: Dg Chest 2 View  Result Date: 05/19/2017 CLINICAL DATA:  46 year old male with cough and congestion. EXAM: CHEST  2 VIEW COMPARISON:  Chest radiograph dated 09/14/2014 FINDINGS: The heart size and mediastinal contours are within normal limits. Both lungs are clear. The visualized skeletal structures are unremarkable. IMPRESSION: No active cardiopulmonary disease. Electronically Signed   By: Elgie CollardArash  Radparvar M.D.   On:  05/19/2017 02:48    ED COURSE  Nursing notes and initial vitals signs, including pulse oximetry, reviewed.  Vitals:   05/19/17 0208  BP: (!) 133/96  Pulse: 82  Resp: 18  Temp: 98.1 F (36.7 C)  TempSrc: Oral  SpO2: 100%  Weight: 93 kg (205 lb)  Height: 5\' 7"  (1.702 m)   Patient was advised to take ibuprofen or Aleve in addition to the Tylenol as he may get better relief of his chills and body aches.  PROCEDURES    ED DIAGNOSES     ICD-10-CM   1. Influenza-like illness R69        Michael Fleming, Michael RuizJohn, MD 05/19/17 [phone]

## 2017-05-19 NOTE — ED Notes (Signed)
C/o chills/sweats body ache and productive cough x 2 days

## 2017-05-19 NOTE — ED Triage Notes (Addendum)
Pt c/o cough, congestion, body aches, and chills for the last two days, taking tylenol cold and flu, last dose was at 1700 last night.  Girlfriend had the same thing

## 2017-05-22 ENCOUNTER — Ambulatory Visit (INDEPENDENT_AMBULATORY_CARE_PROVIDER_SITE_OTHER): Payer: Self-pay | Admitting: Orthopaedic Surgery

## 2017-06-01 ENCOUNTER — Ambulatory Visit
Admission: RE | Admit: 2017-06-01 | Discharge: 2017-06-01 | Disposition: A | Discharge status: Home / Self Care | Payer: No Typology Code available for payment source | Source: Ambulatory Visit | Attending: Orthopaedic Surgery | Admitting: Orthopaedic Surgery

## 2017-06-01 DIAGNOSIS — M25562 Pain in left knee: Principal | ICD-10-CM

## 2017-06-01 DIAGNOSIS — M25531 Pain in right wrist: Secondary | ICD-10-CM

## 2017-06-01 DIAGNOSIS — G8929 Other chronic pain: Secondary | ICD-10-CM

## 2017-06-01 IMAGING — CT CT WRIST*R* W/CM
1 series · 12 of 14 positions shown, 15 images · IV contrast (agent unspecified)
Comparison: Right wrist x-rays dated [DATE]..

CONTRAST:  See injection documentation.

CLINICAL DATA: Persistent right wrist pain after twisting injury
while dropping a heavy piece of furniture 7 months ago.

EXAM:
CT OF THE UPPER RIGHT EXTREMITY WITH CONTRAST
TECHNIQUE: Multidetector CT imaging of the upper right extremity was performed
according to the standard protocol following intra-articular
contrast administration.

[Series 4: ext-soft · axial · 0.23mm/px · z∈[+30,+152]mm · 12 of 73 slices shown, 15 images]
[im 6/73  soft-tissue]
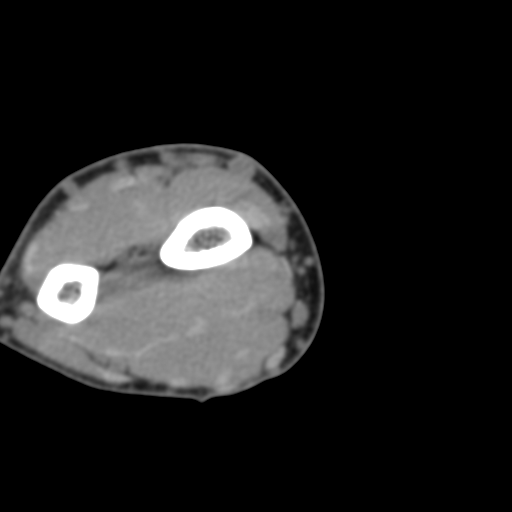
[im 6/73  bone]
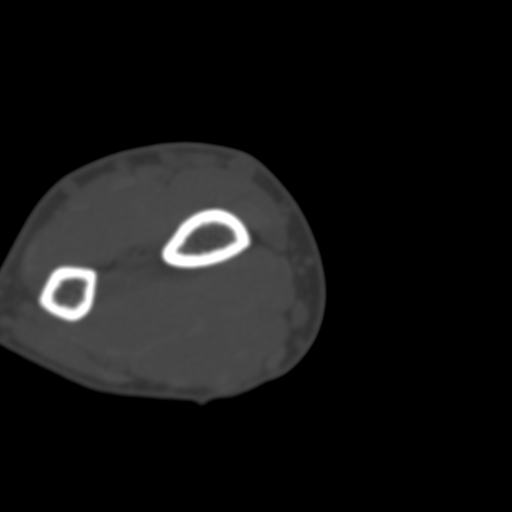
[im 12/73  bone]
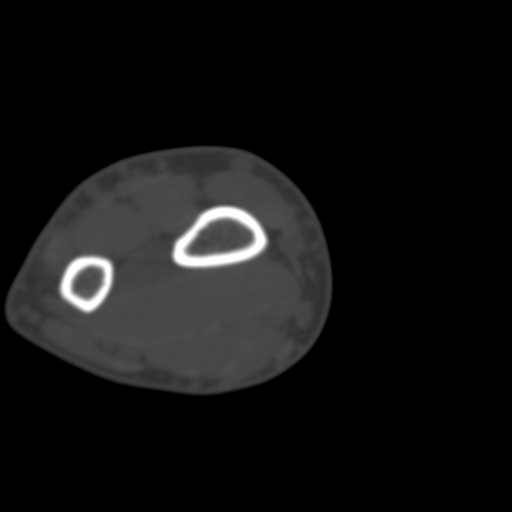
[im 17/73  bone]
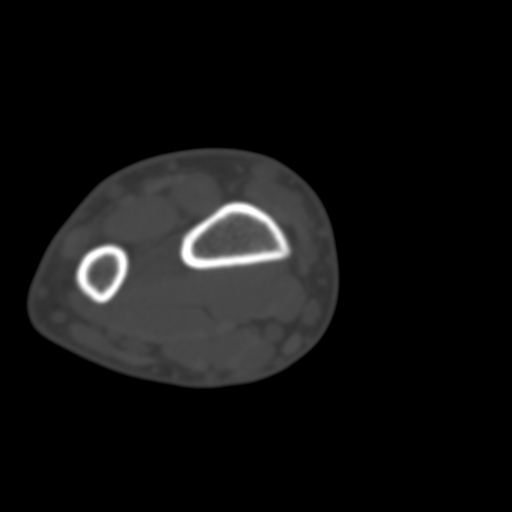
[im 23/73  bone]
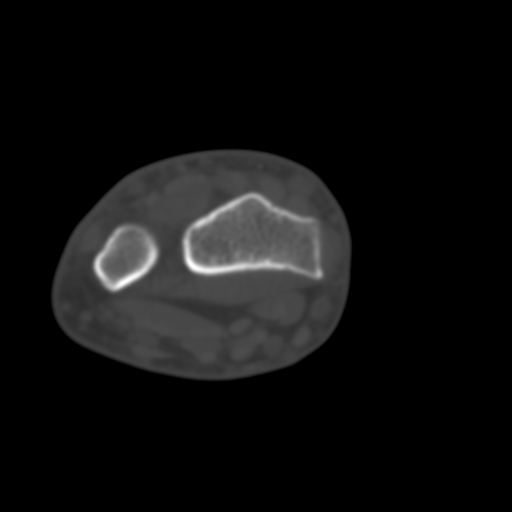
[im 28/73  soft-tissue]
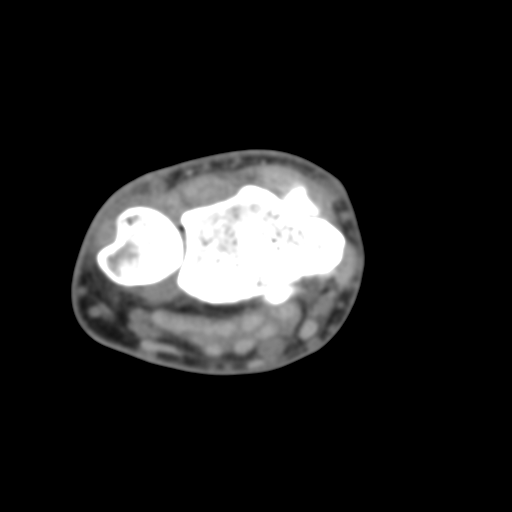
[im 28/73  bone]
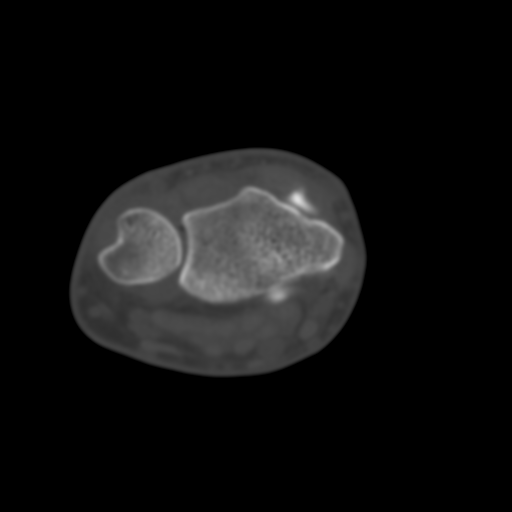
[im 34/73  bone]
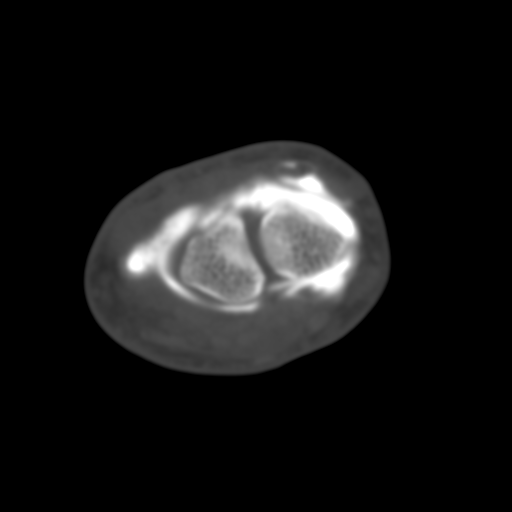
[im 39/73  bone]
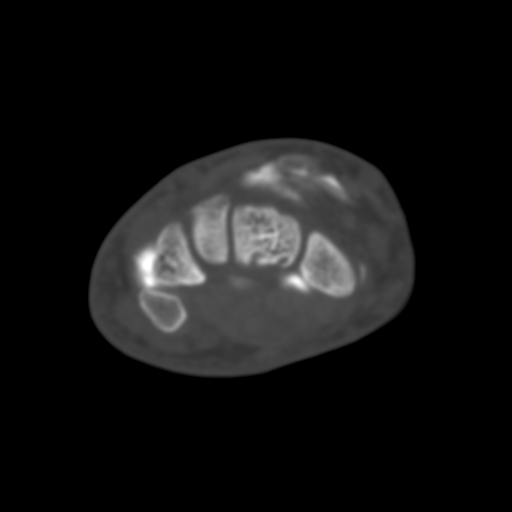
[im 45/73  bone]
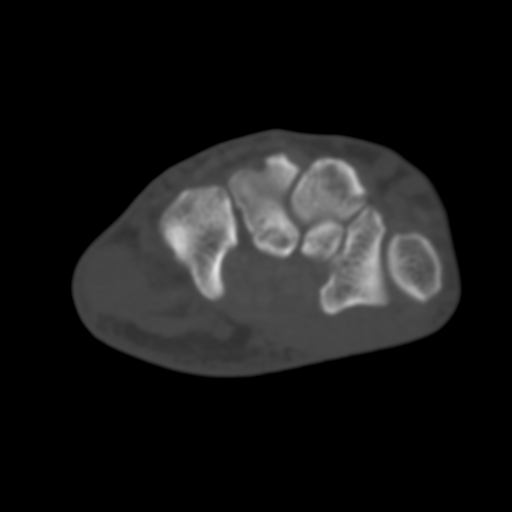
[im 50/73  soft-tissue]
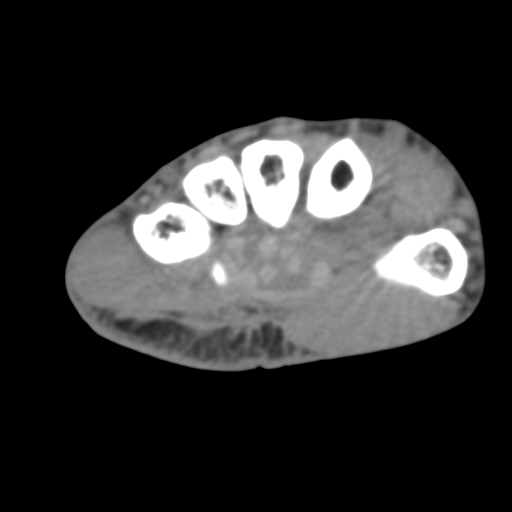
[im 50/73  bone]
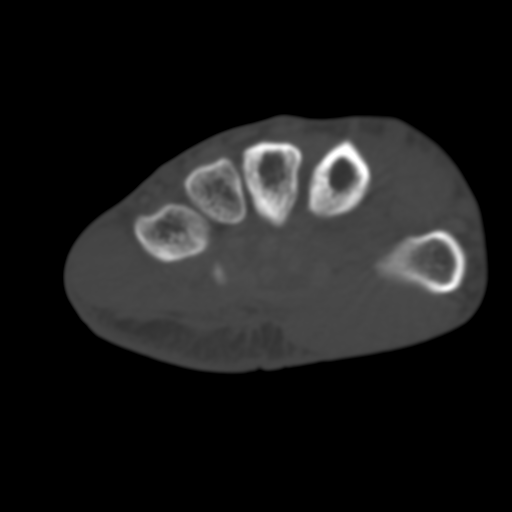
[im 56/73  bone]
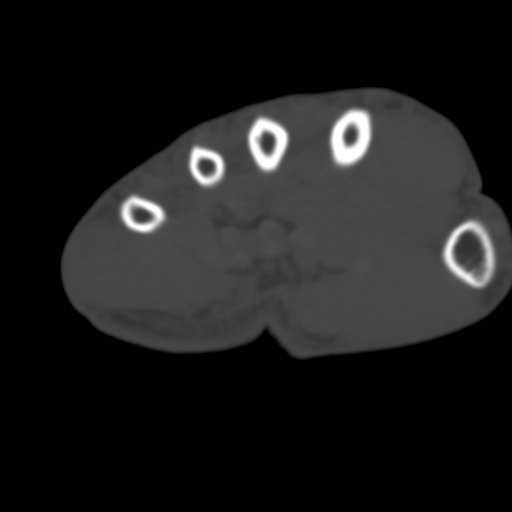
[im 61/73  bone]
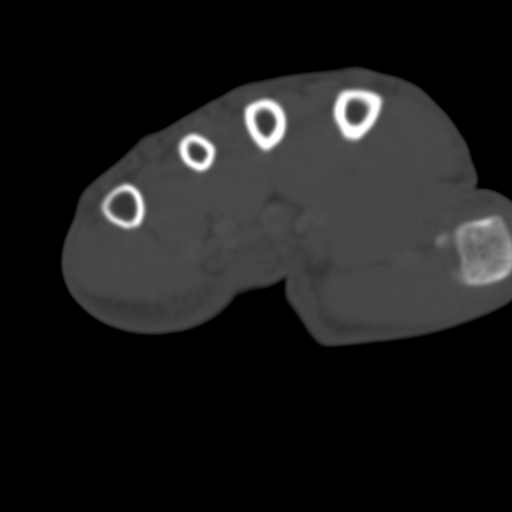
[im 67/73  bone]
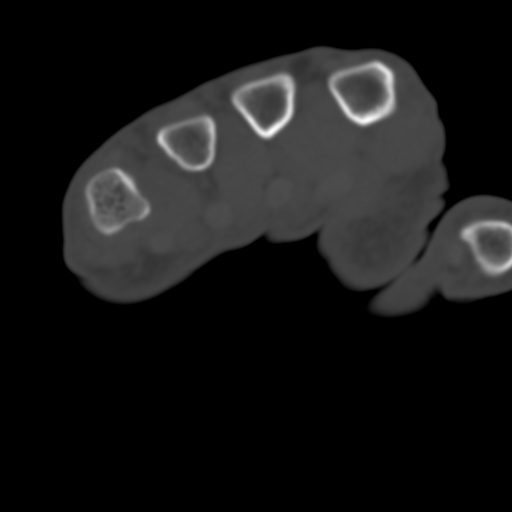

[12 of 14 positions shown; findings below may reference images not displayed]

FINDINGS: Bones/Joint/Cartilage

No acute fracture or dislocation. Normal alignment. Joint spaces are
preserved. Tiny erosion in the central distal ulna. Small amount of
air in the distal radioulnar joint. Bone mineralization is normal.

Ligaments

The scapholunate and lunatotriquetral ligaments are intact. The TFCC
is intact.

Muscles and Tendons

No muscle atrophy. The flexor and extensor tendons are unremarkable.
Small amount of contrast within the extensor carpi radialis brevis
tendon is likely iatrogenic.

Soft tissues

Unremarkable.
IMPRESSION: 1. No acute osseous abnormality.  No definite ligamentous injury.
2. Tiny erosion in the central distal ulna, nonspecific.

## 2017-06-01 IMAGING — XA DG FLUORO GUIDE NDL PLC/BX
7 series · 7 of 7 positions shown · non-contrast
Comparison: none

CLINICAL DATA: Ulnar side wrist pain after lifting injury.

[Series 1: ortho standard · 1 of 1 slices shown (1 of 7)]
[im 1/1]
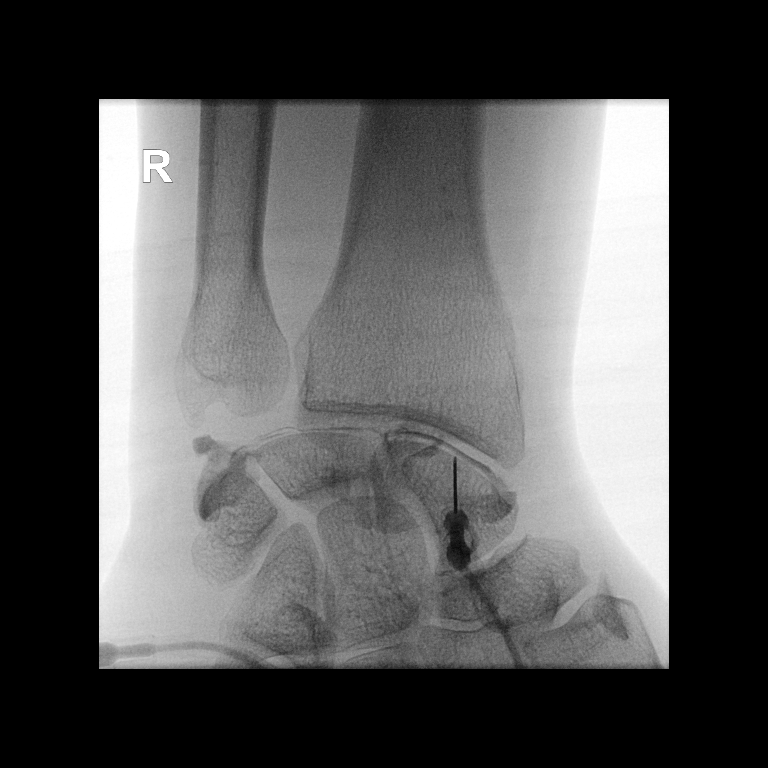

[Series 2: ortho standard · 1 of 1 slices shown (2 of 7)]
[im 1/1]
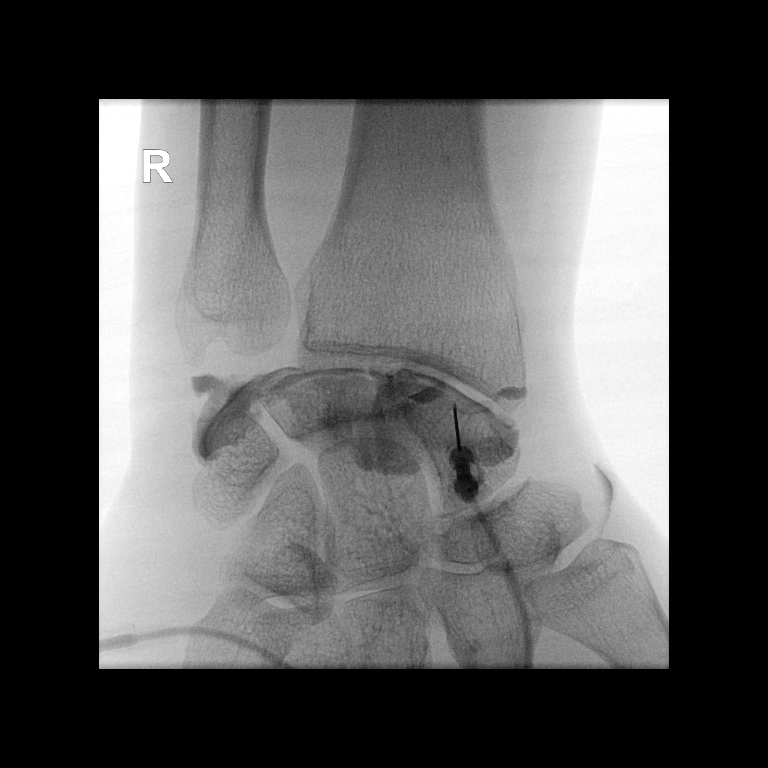

[Series 3: ortho standard · 1 of 1 slices shown (3 of 7)]
[im 1/1]
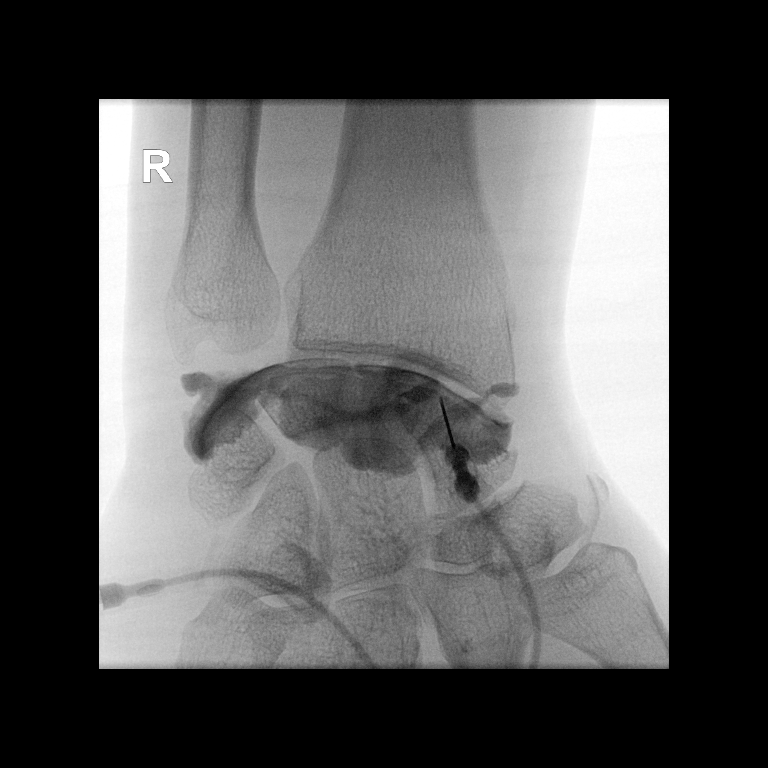

[Series 4: ortho standard · 1 of 1 slices shown (4 of 7)]
[im 1/1]
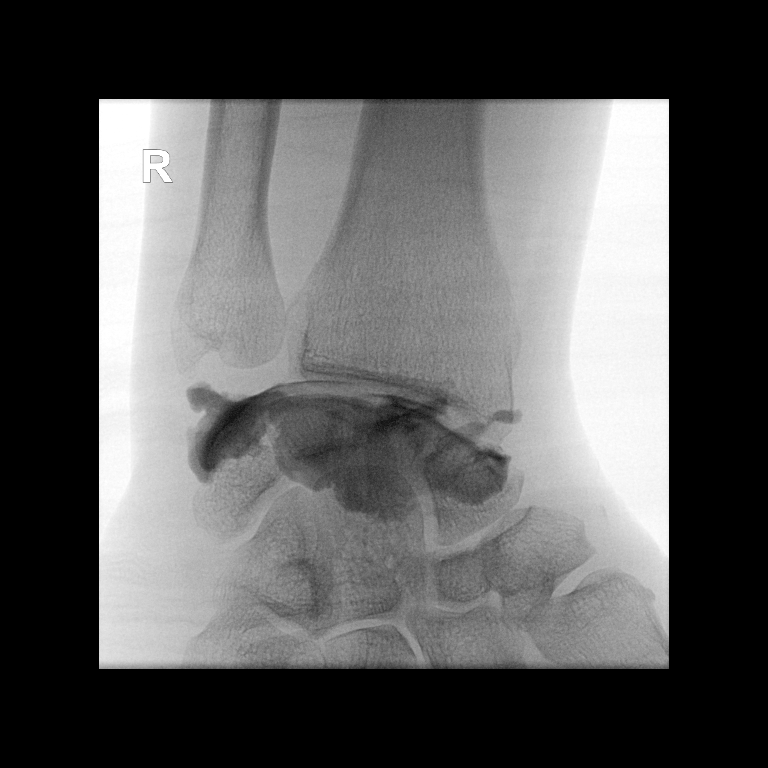

[Series 5: ortho standard · 1 of 1 slices shown (5 of 7)]
[im 1/1]
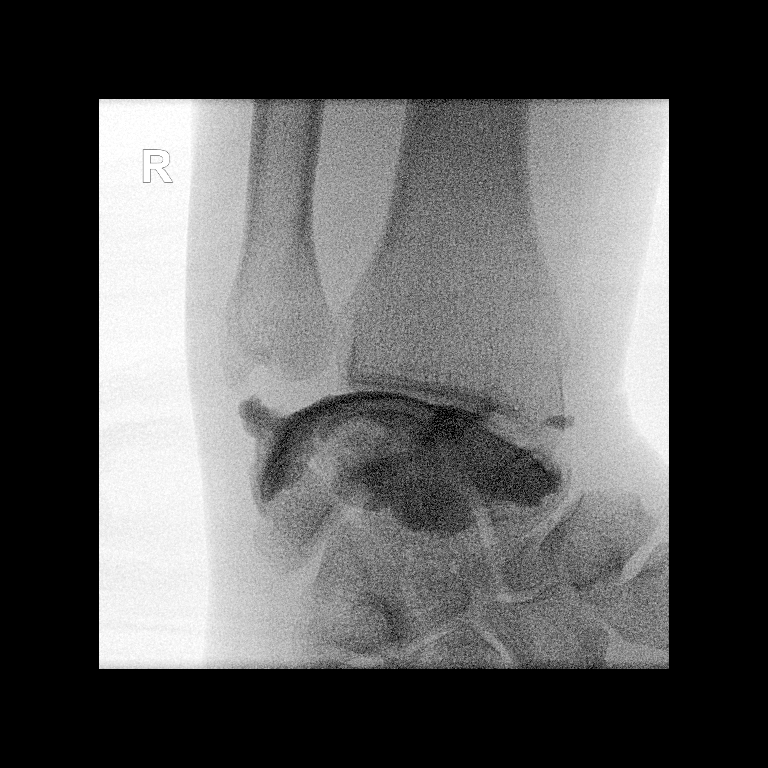

[Series 6: ortho standard · 1 of 1 slices shown (6 of 7)]
[im 1/1]
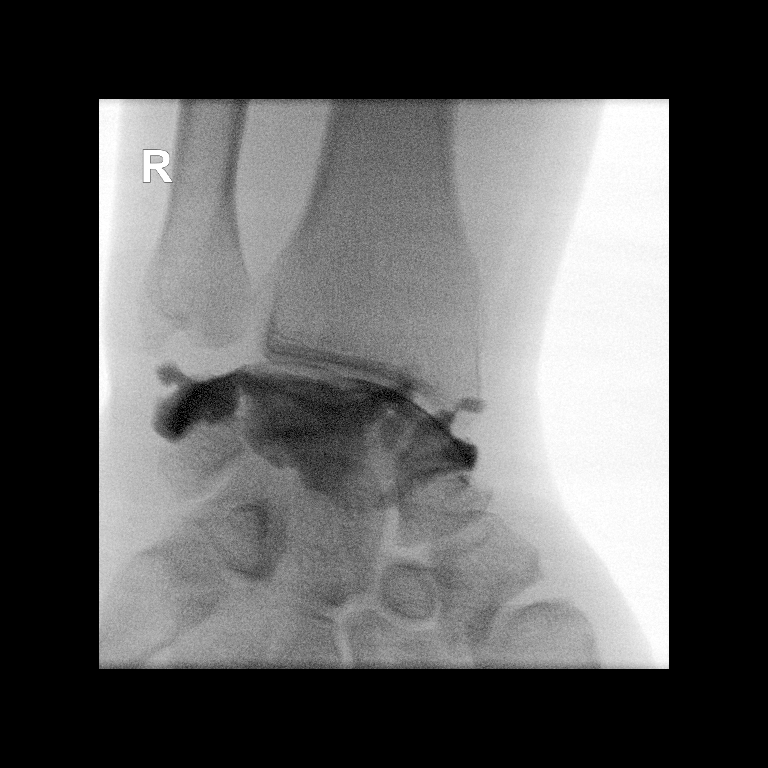

[Series 7: ortho standard · 1 of 1 slices shown (7 of 7)]
[im 1/1]
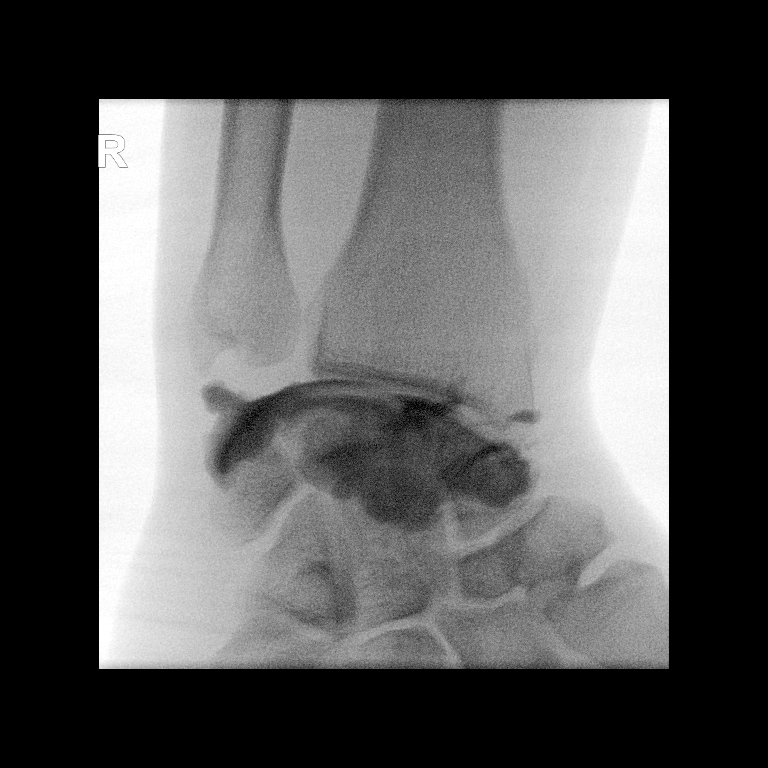

[7 of 7 positions shown; findings below may reference images not displayed]

FLUOROSCOPY TIME:  0 minutes 45 seconds. 1.98 micro gray meter
squared

PROCEDURE:
Right RADIOCARPAL JOINT INJECTION UNDER FLUOROSCOPY, in preparation
for CT arthrography.

The skin dorsal to the wrist was scrubbed with Betadine and draped
in sterile fashion. Skin anesthesia was carried out using!%
Lidocaine. A 25 SIMPY needle was directed into the radiocarpal joint
under flouroscopic guidance. 2cc of dilute Isovue 200 was then used
to fill the radiocarpal joint. Filming showed restriction of the
contrast to the radiocarpal joint. Filling includes radial and ulnar
deviation imaging..
IMPRESSION: Technically successful right radiocarpal joint injection for CT
arthrography. Normal radiocarpal joint appearance.

## 2017-06-01 IMAGING — CT CT KNEE*L* W/O CM
1 series · 12 of 14 positions shown, 15 images · non-contrast
Comparison: None.

CLINICAL DATA: Left knee injured while running.

EXAM:
CT OF THE LEFT KNEE WITHOUT CONTRAST
TECHNIQUE: Multidetector CT imaging of the LEFT knee was performed according to
the standard protocol. Multiplanar CT image reconstructions were
also generated.

[Series 4: knee soft tissue · axial · 0.38mm/px · z∈[-180,+34]mm · 12 of 85 slices shown, 15 images]
[im 7/85  soft-tissue]
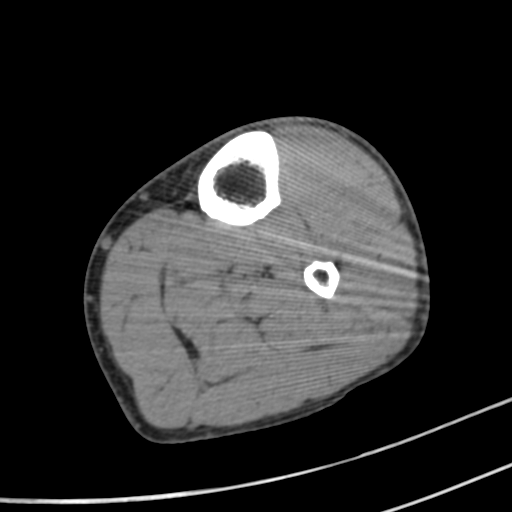
[im 7/85  bone]
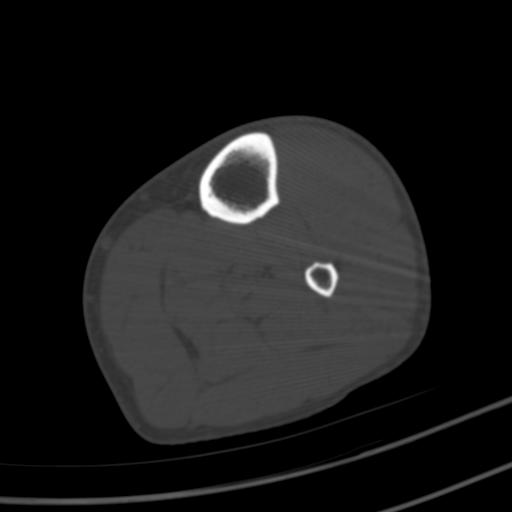
[im 13/85  bone]
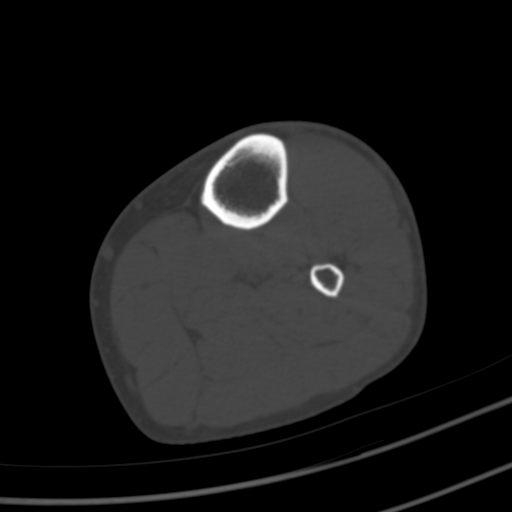
[im 20/85  bone]
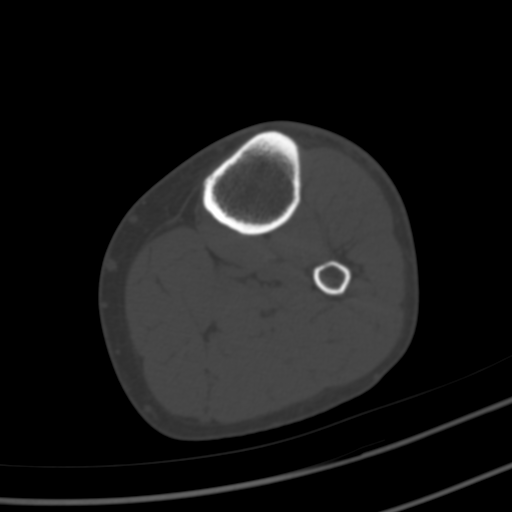
[im 26/85  bone]
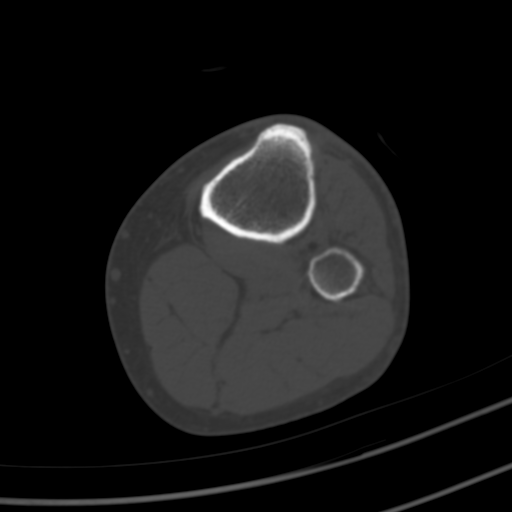
[im 33/85  soft-tissue]
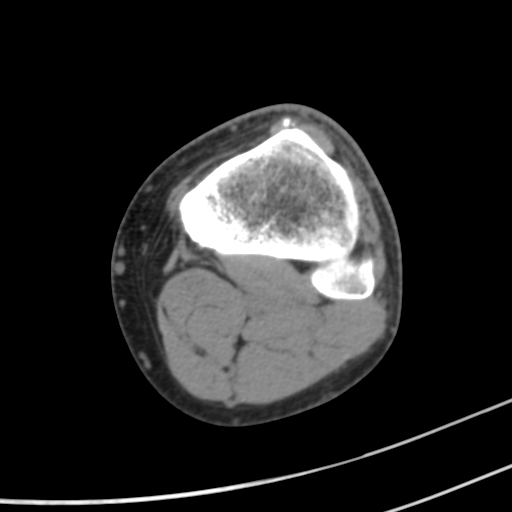
[im 33/85  bone]
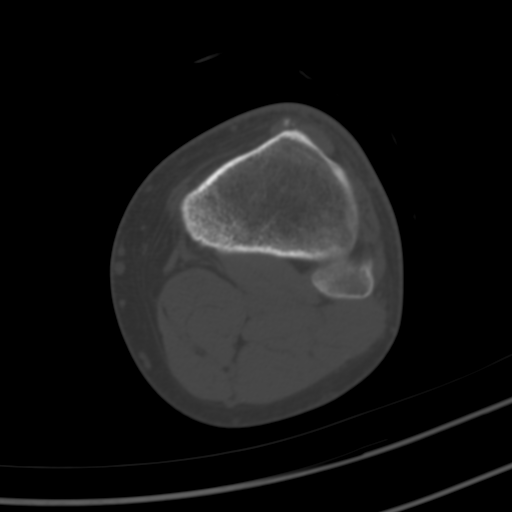
[im 39/85  bone]
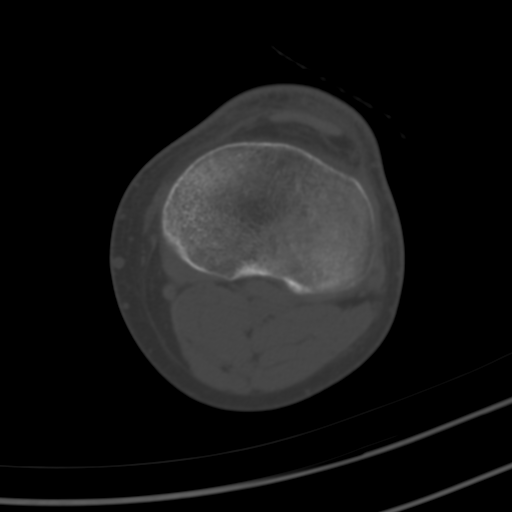
[im 46/85  bone]
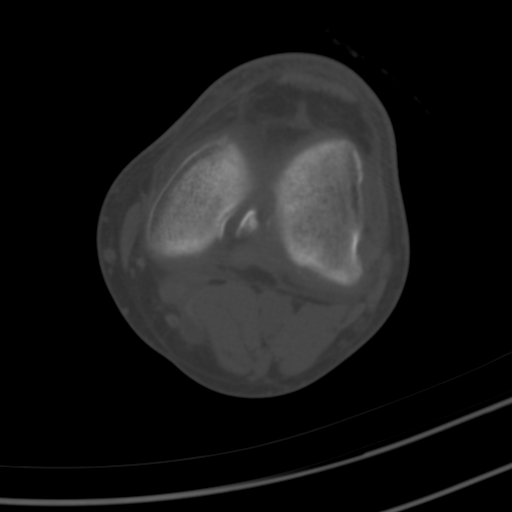
[im 52/85  bone]
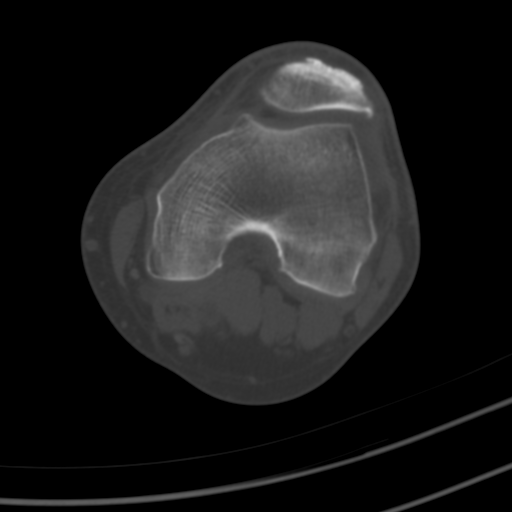
[im 59/85  soft-tissue]
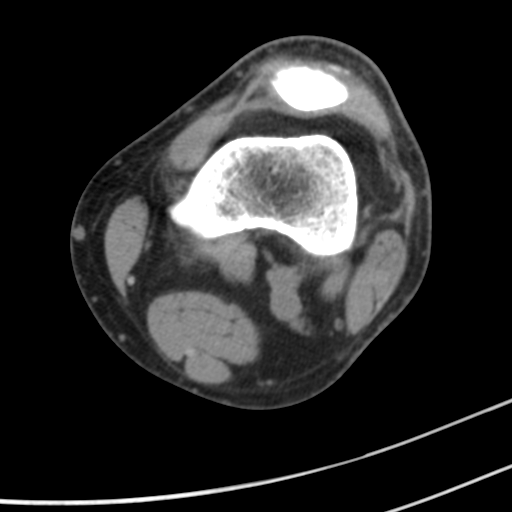
[im 59/85  bone]
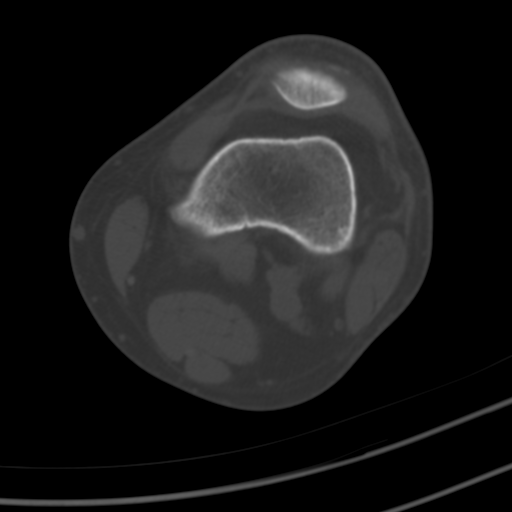
[im 65/85  bone]
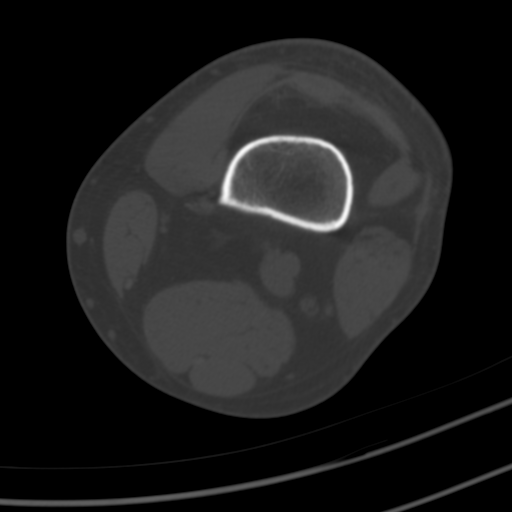
[im 72/85  bone]
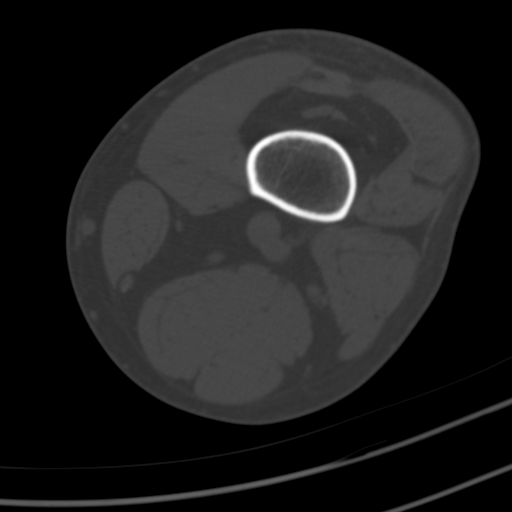
[im 78/85  bone]
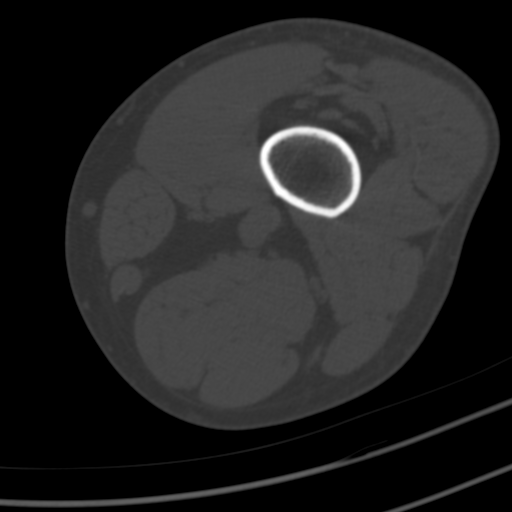

[12 of 14 positions shown; findings below may reference images not displayed]

FINDINGS: Bones/Joint/Cartilage

No fracture or dislocation. Normal alignment. No joint effusion.
Small Baker cyst. Mild edema in Hoffa's fat.

Mild lateral femorotibial compartment joint space narrowing with
subchondral sclerosis and mild subchondral cystic changes in the
lateral tibial plateau most concerning for mild osteoarthritis. Mild
chondrocalcinosis of the medial and lateral femorotibial
compartments. Medial femorotibial compartment joint space is
maintained. Patellofemoral compartment joint space is maintained.

Ligaments

Ligaments are suboptimally evaluated by CT.

Muscles and Tendons
Muscles are normal. No muscle atrophy. No intramuscular fluid
collection or hematoma. Quadriceps tendon and patellar tendon are
intact.

Soft tissue
No fluid collection or hematoma.  No soft tissue mass.
IMPRESSION: 1.  No acute osseous injury of the left knee.
2. Mild osteoarthritis of the left lateral femorotibial compartment.

## 2017-06-01 MED ORDER — IOPAMIDOL (ISOVUE-M 200) INJECTION 41%
2.0000 mL | Freq: Once | INTRAMUSCULAR | Status: AC
  Administered 2017-06-01: 2 mL via INTRA_ARTICULAR

## 2017-06-05 ENCOUNTER — Ambulatory Visit (INDEPENDENT_AMBULATORY_CARE_PROVIDER_SITE_OTHER): Payer: Self-pay | Admitting: Orthopaedic Surgery

## 2017-06-07 ENCOUNTER — Ambulatory Visit (INDEPENDENT_AMBULATORY_CARE_PROVIDER_SITE_OTHER): Payer: Self-pay | Admitting: Family Medicine

## 2017-06-07 ENCOUNTER — Encounter: Payer: Self-pay | Admitting: Family Medicine

## 2017-06-07 VITALS — BP 140/80 | HR 84 | Temp 98.8°F | Resp 16 | Ht 67.0 in | Wt 199.6 lb

## 2017-06-07 DIAGNOSIS — E669 Obesity, unspecified: Secondary | ICD-10-CM

## 2017-06-07 DIAGNOSIS — E785 Hyperlipidemia, unspecified: Secondary | ICD-10-CM

## 2017-06-07 DIAGNOSIS — I10 Essential (primary) hypertension: Secondary | ICD-10-CM

## 2017-06-07 MED ORDER — LOSARTAN POTASSIUM 50 MG PO TABS: 50.0000 mg | ORAL_TABLET | Freq: Every day | ORAL | 1 refills | Status: DC

## 2017-06-07 MED FILL — LOSARTAN POTASSIUM 50 MG TA: 50 | 30 days supply | Qty: 30 | Fill #0

## 2017-06-07 NOTE — Progress Notes (Signed)
Patient ID: Michael Fleming, male    DOB: [birth year], 47 y.o.   MRN: [id number]  PCP: Michael Neighbors, FNP  Chief Complaint  Patient presents with  . Follow-up    HTN    Subjective:  HPI Michael Fleming is a 47 y.o. male with hypertension, chronic knee and wrist pain, presents for hypertension follow-up. [name] reports no home monitoring of blood pressure. He has been without medication for two months and did not call to request a refills. He has experienced headaches since discontinuing blood pressure medication. Admits to high sodium and high fat diet. He is a nonsmoker. Denies any episodes of dizziness, headaches, weakness, shortness of breath, or chest pain. Michael Fleming's lipid panel was checked in May and LDL 155 and Cholesterol 212. During that time, lifestyle and dietary modifications were recommended. [name] admits to no routine physical activity. Current Body mass index is 31.26 kg/m. Social History   Socioeconomic History  . Marital status: Single    Spouse name: Not on file  . Number of children: Not on file  . Years of education: Not on file  . Highest education level: Not on file  Social Needs  . Financial resource strain: Not on file  . Food insecurity - worry: Not on file  . Food insecurity - inability: Not on file  . Transportation needs - medical: Not on file  . Transportation needs - non-medical: Not on file  Occupational History  . Not on file  Tobacco Use  . Smoking status: Never Smoker  . Smokeless tobacco: Never Used  Substance and Sexual Activity  . Alcohol use: Yes    Comment: occasional  . Drug use: No  . Sexual activity: Not on file  Other Topics Concern  . Not on file  Social History Narrative  . Not on file    Family History  Problem Relation Age of Onset  . Hypertension Father    Review of Systems  Constitutional: Negative.   Respiratory: Negative.   Cardiovascular: Negative.   Gastrointestinal: Negative.   Neurological: Positive for  headaches.  Hematological: Negative.   Psychiatric/Behavioral: Negative.     Patient Active Problem List   Diagnosis Date Noted  . Pain in right wrist 03/27/2017  . Chronic pain of left knee 03/27/2017  . HTN (hypertension) 10/23/2016  . History of brain surgery 10/23/2016  . Chronic headache 10/23/2016    No Known Allergies  Prior to Admission medications   Medication Sig Start Date End Date Taking? Authorizing Provider  ibuprofen (ADVIL,MOTRIN) 800 MG tablet Take 800 mg by mouth as needed. 05/05/17  Yes [provider]  amLODipine (NORVASC) 5 MG tablet Take 2 tablets (10 mg total) by mouth daily. Patient not taking: Reported on 06/07/2017 01/20/17   Michael Neighbors, FNP  chlorpheniramine-HYDROcodone [facility]) 10-8 MG/5ML SUER Take 5 mLs by mouth every 12 (twelve) hours as needed. Patient not taking: Reported on 06/07/2017 05/19/17   Molpus, Jonny Ruiz, MD  losartan (COZAAR) 25 MG tablet Take 2 tablets (50 mg total) by mouth daily. Patient not taking: Reported on 06/07/2017 01/20/17   Michael Neighbors, FNP    Past Medical, Surgical Family and Social History reviewed and updated.    Objective:   Today's Vitals   06/07/17 1038  BP: 140/80  Pulse: 84  Resp: 16  Temp: 98.8 F (37.1 C)  TempSrc: Oral  SpO2: 99%  Weight: 199 lb 9.6 oz (90.5 kg)  Height: 5\' 7"  (1.702 m)  Wt Readings from Last 3 Encounters:  06/07/17 199 lb 9.6 oz (90.5 kg)  05/19/17 205 lb (93 kg)  03/07/17 192 lb (87.1 kg)   Physical Exam  Constitutional: He is oriented to person, place, and time. He appears well-developed and well-nourished.  HENT:  Head: Normocephalic and atraumatic.  Eyes: Conjunctivae and EOM are normal. Pupils are equal, round, and reactive to light.  Neck: Normal range of motion. Neck supple.  Cardiovascular: Normal rate, regular rhythm, normal heart sounds and intact distal pulses.  Pulmonary/Chest: Effort normal and breath sounds normal.  Abdominal:  Soft. Bowel sounds are normal.  Musculoskeletal: Normal range of motion.  Neurological: He is alert and oriented to person, place, and time.  Skin: Skin is warm and dry.  Psychiatric: He has a normal mood and affect. His behavior is normal. Judgment and thought content normal.   Assessment & Plan:  1. Essential hypertension, elevated today. Resuming losartan 50 mg once daily. We have discussed target BP range and blood pressure goal. I have advised patient to check BP regularly and to call us back or report to clinic if the numbers are consistently higher than 140/90. We discussed the importance of compliance with medical therapy and DASH diet recommended, consequences of uncontrolled hypertension discussed.    2. Hyperlipidemia, unspecified hyperlipidemia type The 10-year ASCVD risk score Denman George(Goff DC Jr., et al., 2013) is: 10%   Values used to calculate the score:     Age: [age] years     Sex: Male     Is Non-Hispanic African American: Yes     Diabetic: No     Tobacco smoker: No     Systolic Blood Pressure: 140 mmHg     Is BP treated: Yes     HDL Cholesterol: 43 mg/dL     Total Cholesterol: 214 mg/dL   -Will recheck lipid panel today. Patient is fasting.  3. Obesity (BMI 30-39.9) Goal BMI  <25. Encouraged efforts to reduce weight include engaging in physical activity as tolerated with goal of 150 minutes per week. Improve dietary choices and eat a meal regimen consistent with a Mediterranean or DASH diet. Reduce simple carbohydrates. Do not skip meals and eat healthy snacks throughout the day to avoid over-eating at dinner. Set a goal weight loss that is achievable for you.   Meds ordered this encounter  Medications  . losartan (COZAAR) 50 MG tablet    Sig: Take 1 tablet (50 mg total) by mouth daily.    Dispense:  90 tablet    Refill:  1    Order Specific Question:   Supervising Provider    Answer:   Quentin AngstJEGEDE, OLUGBEMIGA E [id number]]    Orders Placed This Encounter  Procedures  .  Lipid panel    RTC: 6 months for hypertension and hyperlipidemia follow-up.     Godfrey PickKimberly S. Tiburcio PeaHarris, MSN, FNP-C [facility]  [address] [place], [place] [address] [phone]

## 2017-06-08 ENCOUNTER — Telehealth: Payer: Self-pay | Admitting: Family Medicine

## 2017-06-08 ENCOUNTER — Ambulatory Visit (INDEPENDENT_AMBULATORY_CARE_PROVIDER_SITE_OTHER): Payer: Self-pay | Admitting: Orthopaedic Surgery

## 2017-06-08 DIAGNOSIS — M1712 Unilateral primary osteoarthritis, left knee: Secondary | ICD-10-CM

## 2017-06-08 DIAGNOSIS — M25531 Pain in right wrist: Secondary | ICD-10-CM

## 2017-06-08 LAB — LIPID PANEL
CHOLESTEROL TOTAL: 214 mg/dL — AB (ref 100–199)
Chol/HDL Ratio: 5 ratio (ref 0.0–5.0)
HDL: 43 mg/dL (ref >39)
LDL Calculated: 154 mg/dL — ABNORMAL HIGH (ref 0–99)
Triglycerides: 87 mg/dL (ref 0–149)
VLDL CHOLESTEROL CAL: 17 mg/dL (ref 5–40)

## 2017-06-08 MED ORDER — MELOXICAM 7.5 MG PO TABS: 7.5000 mg | ORAL_TABLET | Freq: Two times a day (BID) | ORAL | 2 refills | Status: DC | PRN

## 2017-06-08 MED ORDER — PRAVASTATIN SODIUM 40 MG PO TABS: 40.0000 mg | ORAL_TABLET | Freq: Every day | ORAL | 3 refills | Status: DC

## 2017-06-08 MED FILL — ?PRAVASTATIN SODIUM 40MG TA: 40 | 30 days supply | Qty: 30 | Fill #0

## 2017-06-08 MED FILL — MELOXICAM 7.5 MG TABLET: 7.5 | 15 days supply | Qty: 30 | Fill #0

## 2017-06-08 NOTE — Progress Notes (Signed)
   Office Visit Note   Patient: Michael Fleming           Date of Birth: January 31, 1971           MRN: [id number] Visit Date: 06/08/2017              Requested by: Bing NeighborsHarris, Kimberly S, FNP [address]  [place], [place] [address] PCP: Bing NeighborsHarris, Kimberly S, FNP   Assessment & Plan: Visit Diagnoses:  1. Unilateral primary osteoarthritis, left knee   2. Pain in right wrist     Plan: Impression is right hand numbness and tingling possible carpal tunnel syndrome and mild left knee CT findings.  Recommend nerve conduction studies to assess for carpal tunnel syndrome.  Prescription for meloxicam for his knee arthritis.  Questions encouraged and answered.  Follow-up after the studies.  Follow-Up Instructions: Return if symptoms worsen or fail to improve.   Orders:  No orders of the defined types were placed in this encounter.  Meds ordered this encounter  Medications  . meloxicam (MOBIC) 7.5 MG tablet    Sig: Take 1 tablet (7.5 mg total) by mouth 2 (two) times daily as needed for pain.    Dispense:  30 tablet    Refill:  2      Procedures: No procedures performed   Clinical Data: No additional findings.   Subjective: Chief Complaint  Patient presents with  . Right Wrist - Follow-up  . Left Knee - Follow-up    Patient follows up today for review of his CT scans.  He does endorse numbness and tingling in his right hand.    Review of Systems  Constitutional: Negative.   All other systems reviewed and are negative.    Objective: Vital Signs: There were no vitals taken for this visit.  Physical Exam  Constitutional: He is oriented to person, place, and time. He appears well-developed and well-nourished.  Pulmonary/Chest: Effort normal.  Abdominal: Soft.  Neurological: He is alert and oriented to person, place, and time.  Skin: Skin is warm.  Psychiatric: He has a normal mood and affect. His behavior is normal. Judgment and thought content normal.  Nursing note and vitals  reviewed.   Ortho Exam Exam is stable. Specialty Comments:  No specialty comments available.  Imaging: No results found.   PMFS History: Patient Active Problem List   Diagnosis Date Noted  . Pain in right wrist 03/27/2017  . Chronic pain of left knee 03/27/2017  . HTN (hypertension) 10/23/2016  . History of brain surgery 10/23/2016  . Chronic headache 10/23/2016   Past Medical History:  Diagnosis Date  . Hypertension   . Seizures (HCC)    last seizure age 47    Family History  Problem Relation Age of Onset  . Hypertension Father     Past Surgical History:  Procedure Laterality Date  . BRAIN SURGERY     1981 blot clot removed  . FOOT SURGERY     right bunion removed   Social History   Occupational History  . Not on file  Tobacco Use  . Smoking status: Never Smoker  . Smokeless tobacco: Never Used  Substance and Sexual Activity  . Alcohol use: Yes    Comment: occasional  . Drug use: No  . Sexual activity: Not on file

## 2017-06-08 NOTE — Telephone Encounter (Signed)
Contact patient to advise that his cholesterol panel continues to remain abnormally elevated.  As we discussed during his recent visit I am start him on pravastatin 40 mg once daily at 6 PM.  I recommend reducing significantly the intake of fried and foods high in saturated fats, processed foods, and simple sugars.  I encouraged exercise with a weekly recommendation of 150 minutes/week.   Godfrey PickKimberly S. Tiburcio PeaHarris, MSN, FNP-C [facility] [address] [place], [place] [address] [phone]

## 2017-06-08 NOTE — Telephone Encounter (Signed)
Patient notified and will pick up medication  

## 2017-07-13 NOTE — Progress Notes (Signed)
Erroneous

## 2017-08-21 MED FILL — IBUPROFEN 800 MG TABLET: 800 | 5 days supply | Qty: 20 | Fill #1

## 2017-08-28 MED FILL — LOSARTAN POTASSIUM 50 MG TA: 50 | 30 days supply | Qty: 30 | Fill #1

## 2017-12-05 ENCOUNTER — Encounter: Payer: Self-pay | Admitting: Family Medicine

## 2017-12-05 ENCOUNTER — Ambulatory Visit (INDEPENDENT_AMBULATORY_CARE_PROVIDER_SITE_OTHER): Payer: Self-pay | Admitting: Family Medicine

## 2017-12-05 VITALS — BP 144/90 | HR 78 | Temp 97.9°F | Ht 67.0 in | Wt 210.0 lb

## 2017-12-05 DIAGNOSIS — M79641 Pain in right hand: Secondary | ICD-10-CM

## 2017-12-05 DIAGNOSIS — E785 Hyperlipidemia, unspecified: Secondary | ICD-10-CM

## 2017-12-05 DIAGNOSIS — I10 Essential (primary) hypertension: Secondary | ICD-10-CM

## 2017-12-05 DIAGNOSIS — Z131 Encounter for screening for diabetes mellitus: Secondary | ICD-10-CM

## 2017-12-05 DIAGNOSIS — Z09 Encounter for follow-up examination after completed treatment for conditions other than malignant neoplasm: Secondary | ICD-10-CM

## 2017-12-05 DIAGNOSIS — M79642 Pain in left hand: Secondary | ICD-10-CM

## 2017-12-05 LAB — POCT URINALYSIS DIP (MANUAL ENTRY)
Bilirubin, UA: NEGATIVE
Blood, UA: NEGATIVE
Glucose, UA: NEGATIVE mg/dL
Ketones, POC UA: NEGATIVE mg/dL
Leukocytes, UA: NEGATIVE
Nitrite, UA: NEGATIVE
Spec Grav, UA: 1.025 (ref 1.010–1.025)
Urobilinogen, UA: 0.2 E.U./dL
pH, UA: 6 (ref 5.0–8.0)

## 2017-12-05 LAB — POCT GLYCOSYLATED HEMOGLOBIN (HGB A1C): Hemoglobin A1C: 5.4 % (ref 4.0–5.6)

## 2017-12-05 MED ORDER — MELOXICAM 7.5 MG PO TABS: 7.5000 mg | ORAL_TABLET | Freq: Two times a day (BID) | ORAL | 2 refills | Status: DC | PRN

## 2017-12-05 MED ORDER — AMLODIPINE BESYLATE 10 MG PO TABS: 10.0000 mg | ORAL_TABLET | Freq: Every day | ORAL | 2 refills | Status: DC

## 2017-12-05 MED ORDER — LOSARTAN POTASSIUM 50 MG PO TABS: 50.0000 mg | ORAL_TABLET | Freq: Every day | ORAL | 2 refills | Status: DC

## 2017-12-05 MED ORDER — PRAVASTATIN SODIUM 40 MG PO TABS: 40.0000 mg | ORAL_TABLET | Freq: Every day | ORAL | 2 refills | Status: DC

## 2017-12-05 MED FILL — AMLODIPINE BESYLATE 10 MG T: 10 | 30 days supply | Qty: 30 | Fill #0

## 2017-12-05 MED FILL — LOSARTAN POTASSIUM 50 MG TA: 50 | 30 days supply | Qty: 30 | Fill #0

## 2017-12-05 MED FILL — PRAVASTATIN NA 40 MG TAB: 40 | 30 days supply | Qty: 30 | Fill #0

## 2017-12-05 MED FILL — MELOXICAM 7.5 MG TABLET: 7.5 | 15 days supply | Qty: 30 | Fill #0

## 2017-12-05 NOTE — Progress Notes (Signed)
Subjective:    Patient ID: Michael Fleming, male    DOB: May 09, 1971, 47 y.o.   MRN: [id number]   PCP: Raliegh IpNatalie Karlis Cregg, NP  Chief Complaint  Patient presents with  . Follow-up    HTN    HPI  [name] has a past medical history of Seizures and Hypertension. He is here today for follow up.  Current Status: Since his last office visit, he is doing well with no complaints. He denies fevers, chills, fatigue, recent infections, weight loss, and night sweats. She has not had any headaches, dizziness, and falls. No chest pain, heart palpitations, cough and shortness of breath reported. He sates that he has constipation, which he takes OTC stool softeners for relief. No reports of any other GI problems such as nausea, vomiting, and diarrhea. He has no reports of blood in stools, dysuria and hematuria. No depression or anxiety. He has mild back pain, which he takes OTC pain medications as needed.    Past Medical History:  Diagnosis Date  . Hypertension   . Seizures (HCC)    last seizure age 47    Family History  Problem Relation Age of Onset  . Hypertension Father     Social History   Socioeconomic History  . Marital status: Single    Spouse name: Not on file  . Number of children: Not on file  . Years of education: Not on file  . Highest education level: Not on file  Occupational History  . Not on file  Social Needs  . Financial resource strain: Not on file  . Food insecurity:    Worry: Not on file    Inability: Not on file  . Transportation needs:    Medical: Not on file    Non-medical: Not on file  Tobacco Use  . Smoking status: Never Smoker  . Smokeless tobacco: Never Used  Substance and Sexual Activity  . Alcohol use: Yes    Comment: occasional  . Drug use: No  . Sexual activity: Not on file  Lifestyle  . Physical activity:    Days per week: Not on file    Minutes per session: Not on file  . Stress: Not on file  Relationships  . Social connections:    Talks  on phone: Not on file    Gets together: Not on file    Attends religious service: Not on file    Active member of club or organization: Not on file    Attends meetings of clubs or organizations: Not on file    Relationship status: Not on file  . Intimate partner violence:    Fear of current or ex partner: Not on file    Emotionally abused: Not on file    Physically abused: Not on file    Forced sexual activity: Not on file  Other Topics Concern  . Not on file  Social History Narrative  . Not on file    Past Surgical History:  Procedure Laterality Date  . BRAIN SURGERY     1981 blot clot removed  . FOOT SURGERY     right bunion removed       Immunization History  Administered Date(s) Administered  . Influenza,inj,Quad PF,6+ Mos 01/20/2017  . Tdap 09/29/2016    Current Meds  Medication Sig  . ibuprofen (ADVIL,MOTRIN) 800 MG tablet Take 800 mg by mouth as needed.  Marland Kitchen. losartan (COZAAR) 50 MG tablet Take 1 tablet (50 mg total) by mouth daily.  .Marland Kitchen  meloxicam (MOBIC) 7.5 MG tablet Take 1 tablet (7.5 mg total) by mouth 2 (two) times daily as needed for pain.  . pravastatin (PRAVACHOL) 40 MG tablet Take 1 tablet (40 mg total) by mouth daily.  . [DISCONTINUED] losartan (COZAAR) 50 MG tablet Take 1 tablet (50 mg total) by mouth daily.  . [DISCONTINUED] meloxicam (MOBIC) 7.5 MG tablet Take 1 tablet (7.5 mg total) by mouth 2 (two) times daily as needed for pain.  . [DISCONTINUED] pravastatin (PRAVACHOL) 40 MG tablet Take 1 tablet (40 mg total) by mouth daily.   No Known Allergies  BP (!) 144/90 (BP Location: Right Arm, Patient Position: Sitting, Cuff Size: Large)   Pulse 78   Temp 97.9 F (36.6 C) (Oral)   Ht 5\' 7"  (1.702 m)   Wt 210 lb (95.3 kg)   SpO2 98%   BMI 32.89 kg/m   Review of Systems  Constitutional: Negative.   HENT: Negative.   Eyes: Positive for visual disturbance (recent visiual changes).  Respiratory: Negative.   Cardiovascular: Negative.    Gastrointestinal: Positive for constipation.  Endocrine: Negative.   Genitourinary: Negative.   Musculoskeletal: Positive for back pain (mild ).  Skin: Negative.   Allergic/Immunologic: Negative.   Neurological: Negative.   Hematological: Negative.   Psychiatric/Behavioral: Negative.    Objective:   Physical Exam  Constitutional: He is oriented to person, place, and time. He appears well-developed and well-nourished.  HENT:  Head: Normocephalic.  Right Ear: External ear normal.  Left Ear: External ear normal.  Nose: Nose normal.  Mouth/Throat: Oropharynx is clear and moist.  Eyes: Pupils are equal, round, and reactive to light. Conjunctivae and EOM are normal.  Neck: Normal range of motion. Neck supple.  Cardiovascular: Normal rate, regular rhythm, normal heart sounds and intact distal pulses.  Pulmonary/Chest: Effort normal.  Abdominal: Soft. Bowel sounds are normal.  Musculoskeletal: Normal range of motion.  Neurological: He is alert and oriented to person, place, and time.  Skin: Skin is warm and dry. Capillary refill takes less than 2 seconds.  Psychiatric: He has a normal mood and affect. His behavior is normal. Judgment and thought content normal.  Nursing note and vitals reviewed.  Assessment & Plan:   1. Screening for diabetes mellitus Hgb A1c is normal at 5.4, mildly decreased from 5.5 on 01/28/2018. He will continue to decrease foods/beverages high in sugars and carbs and follow Heart Healthy or DASH diet. Increase physical activity to at least 30 minutes cardio exercise daily.   - POCT glycosylated hemoglobin (Hb A1C) - POCT urinalysis dipstick  2. Hyperlipidemia, unspecified hyperlipidemia type Lipid panel stable on 09/29/2017. He will continue Pravastatin as prescribed.  - pravastatin (PRAVACHOL) 40 MG tablet; Take 1 tablet (40 mg total) by mouth daily.  Dispense: 30 tablet; Refill: 2  3. Essential hypertension Blood pressure is 144/90 today. He will continue  Amlodipine as prescribed. He will continue to decrease high sodium intake, excessive alcohol intake, increase potassium intake, smoking cessation, and increase physical activity of at least 30 minutes of cardio activity daily. She will continue to follow Heart Healthy or DASH diet. Monitor. - losartan (COZAAR) 50 MG tablet; Take 1 tablet (50 mg total) by mouth daily.  Dispense: 30 tablet; Refill: 2 - amLODipine (NORVASC) 10 MG tablet; Take 1 tablet (10 mg total) by mouth daily.  Dispense: 30 tablet; Refill: 2  4. Pain in both hands Mild. Not worsening. Continue Mobic as prescribed.  - meloxicam (MOBIC) 7.5 MG tablet; Take 1 tablet (7.5 mg  total) by mouth 2 (two) times daily as needed for pain.  Dispense: 30 tablet; Refill: 2  5. Follow up He will follow up in 2 months.   Meds ordered this encounter  Medications  . losartan (COZAAR) 50 MG tablet    Sig: Take 1 tablet (50 mg total) by mouth daily.    Dispense:  30 tablet    Refill:  2  . meloxicam (MOBIC) 7.5 MG tablet    Sig: Take 1 tablet (7.5 mg total) by mouth 2 (two) times daily as needed for pain.    Dispense:  30 tablet    Refill:  2  . pravastatin (PRAVACHOL) 40 MG tablet    Sig: Take 1 tablet (40 mg total) by mouth daily.    Dispense:  30 tablet    Refill:  2  . amLODipine (NORVASC) 10 MG tablet    Sig: Take 1 tablet (10 mg total) by mouth daily.    Dispense:  30 tablet    Refill:  2   Raliegh Ip,  MSN, FNP-C [facility] [address] [place], [place] [address] [phone]

## 2018-02-05 ENCOUNTER — Encounter: Payer: Self-pay | Admitting: Family Medicine

## 2018-02-05 ENCOUNTER — Ambulatory Visit (INDEPENDENT_AMBULATORY_CARE_PROVIDER_SITE_OTHER): Payer: Self-pay | Admitting: Family Medicine

## 2018-02-05 VITALS — BP 130/84 | HR 78 | Temp 98.3°F | Ht 67.0 in | Wt 209.0 lb

## 2018-02-05 DIAGNOSIS — K5900 Constipation, unspecified: Secondary | ICD-10-CM

## 2018-02-05 DIAGNOSIS — M549 Dorsalgia, unspecified: Secondary | ICD-10-CM

## 2018-02-05 DIAGNOSIS — Z09 Encounter for follow-up examination after completed treatment for conditions other than malignant neoplasm: Secondary | ICD-10-CM

## 2018-02-05 DIAGNOSIS — I10 Essential (primary) hypertension: Secondary | ICD-10-CM

## 2018-02-05 DIAGNOSIS — H538 Other visual disturbances: Secondary | ICD-10-CM

## 2018-02-05 DIAGNOSIS — E785 Hyperlipidemia, unspecified: Secondary | ICD-10-CM

## 2018-02-05 LAB — POCT URINALYSIS DIP (MANUAL ENTRY)
Bilirubin, UA: NEGATIVE
Blood, UA: NEGATIVE
Glucose, UA: NEGATIVE mg/dL
Ketones, POC UA: NEGATIVE mg/dL
Leukocytes, UA: NEGATIVE
Nitrite, UA: NEGATIVE
Protein Ur, POC: NEGATIVE mg/dL
Spec Grav, UA: <=1.005 — AB (ref 1.010–1.025)
Urobilinogen, UA: 0.2 E.U./dL
pH, UA: 6 (ref 5.0–8.0)

## 2018-02-05 MED ORDER — PRAVASTATIN SODIUM 40 MG PO TABS: 40.0000 mg | ORAL_TABLET | Freq: Every day | ORAL | 6 refills | Status: DC

## 2018-02-05 MED ORDER — LOSARTAN POTASSIUM 50 MG PO TABS: 50.0000 mg | ORAL_TABLET | Freq: Every day | ORAL | 6 refills | Status: DC

## 2018-02-05 MED ORDER — AMLODIPINE BESYLATE 10 MG PO TABS: 10.0000 mg | ORAL_TABLET | Freq: Every day | ORAL | 6 refills | Status: DC

## 2018-02-05 NOTE — Progress Notes (Signed)
Follow Up  Subjective:    Patient ID: Michael Fleming, male    DOB: 10/22/1970, 47 y.o.   MRN: [id number]   Chief Complaint  Patient presents with  . Follow-up    chronic condition    HPI Michael Fleming is a 47 year old male with a past medical history of Seizures and Hypertension. He is here today for follow up.  Current Status: Since his last office visit, he is doing well with no complaints. He has mild back pain today. He reports mild constipation, which he uses Colace for relief. he denies GI problems such as nausea, vomiting, diarrhea, and constipation. He has no reports of blood in stools, dysuria and hematuria. He does have occasional headaches and recent vision changes.   He denies fevers, chills, fatigue, recent infections, weight loss, and night sweats. He has not had any dizziness, and falls. No chest pain, heart palpitations, cough and shortness of breath reported. No depression or anxiety reported.   Past Medical History:  Diagnosis Date  . Hypertension   . Seizures (HCC)    last seizure age 47    Family History  Problem Relation Age of Onset  . Hypertension Father     Social History   Socioeconomic History  . Marital status: Single    Spouse name: Not on file  . Number of children: Not on file  . Years of education: Not on file  . Highest education level: Not on file  Occupational History  . Not on file  Social Needs  . Financial resource strain: Not on file  . Food insecurity:    Worry: Not on file    Inability: Not on file  . Transportation needs:    Medical: Not on file    Non-medical: Not on file  Tobacco Use  . Smoking status: Never Smoker  . Smokeless tobacco: Never Used  Substance and Sexual Activity  . Alcohol use: Yes    Comment: occasional  . Drug use: No  . Sexual activity: Not on file  Lifestyle  . Physical activity:    Days per week: Not on file    Minutes per session: Not on file  . Stress: Not on file  Relationships  . Social  connections:    Talks on phone: Not on file    Gets together: Not on file    Attends religious service: Not on file    Active member of club or organization: Not on file    Attends meetings of clubs or organizations: Not on file    Relationship status: Not on file  . Intimate partner violence:    Fear of current or ex partner: Not on file    Emotionally abused: Not on file    Physically abused: Not on file    Forced sexual activity: Not on file  Other Topics Concern  . Not on file  Social History Narrative  . Not on file    Past Surgical History:  Procedure Laterality Date  . BRAIN SURGERY     1981 blot clot removed  . FOOT SURGERY     right bunion removed    Immunization History  Administered Date(s) Administered  . Influenza,inj,Quad PF,6+ Mos 01/20/2017  . Tdap 09/29/2016    Current Meds  Medication Sig  . amLODipine (NORVASC) 10 MG tablet Take 1 tablet (10 mg total) by mouth daily.  Marland Kitchen. losartan (COZAAR) 50 MG tablet Take 1 tablet (50 mg total) by mouth daily.  . pravastatin (  PRAVACHOL) 40 MG tablet Take 1 tablet (40 mg total) by mouth daily.  . [DISCONTINUED] amLODipine (NORVASC) 10 MG tablet Take 1 tablet (10 mg total) by mouth daily.  . [DISCONTINUED] losartan (COZAAR) 50 MG tablet Take 1 tablet (50 mg total) by mouth daily.  . [DISCONTINUED] pravastatin (PRAVACHOL) 40 MG tablet Take 1 tablet (40 mg total) by mouth daily.    No Known Allergies  BP 130/84 (BP Location: Right Arm, Patient Position: Sitting, Cuff Size: Large)   Pulse 78   Temp 98.3 F (36.8 C) (Oral)   Ht 5\' 7"  (1.702 m)   Wt 209 lb (94.8 kg)   SpO2 98%   BMI 32.73 kg/m   Review of Systems  Eyes: Positive for visual disturbance (recent visual changes).  Gastrointestinal: Positive for constipation.  Musculoskeletal: Positive for back pain (mild).  Neurological: Positive for headaches.   Objective:   Physical Exam  Cardiovascular: Normal rate, regular rhythm, normal heart sounds and  intact distal pulses.  Pulmonary/Chest: Effort normal and breath sounds normal.  Abdominal: Soft. Bowel sounds are normal.  Psychiatric: He has a normal mood and affect. His behavior is normal. Judgment and thought content normal.   Assessment & Plan:   1. Essential hypertension Antihypertensive medications are effective. Blood pressure is 130/84 today. He will continue Amlodipine and Losartan as prescribed. He will continue to decrease foods/beverages high in sugars and carbs and follow Heart Healthy or DASH diet. Increase physical activity to at least 30 minutes cardio exercise daily.  - amLODipine (NORVASC) 10 MG tablet; Take 1 tablet (10 mg total) by mouth daily.  Dispense: 30 tablet; Refill: 6 - losartan (COZAAR) 50 MG tablet; Take 1 tablet (50 mg total) by mouth daily.  Dispense: 30 tablet; Refill: 6  2. Hyperlipidemia, unspecified hyperlipidemia type We will refill Pravastatin today. We will assess Lipid panel at next office visit.  - pravastatin (PRAVACHOL) 40 MG tablet; Take 1 tablet (40 mg total) by mouth daily.  Dispense: 30 tablet; Refill: 6  3. Back pain, unspecified back location, unspecified back pain laterality, unspecified chronicity Mild.   4. Blurry vision He has prescription for new glasses, but is currently unable to afford to purchase them at this time.   5. Constipation, unspecified constipation type Stable. He will continue OTC stool softener as prescribed.   6. Follow up He will follow up in 6 months.  - POCT urinalysis dipstick  Meds ordered this encounter  Medications  . amLODipine (NORVASC) 10 MG tablet    Sig: Take 1 tablet (10 mg total) by mouth daily.    Dispense:  30 tablet    Refill:  6  . losartan (COZAAR) 50 MG tablet    Sig: Take 1 tablet (50 mg total) by mouth daily.    Dispense:  30 tablet    Refill:  6  . pravastatin (PRAVACHOL) 40 MG tablet    Sig: Take 1 tablet (40 mg total) by mouth daily.    Dispense:  30 tablet    Refill:  6    Raliegh Ip,  MSN, [facility] Patient [facility] [address] [place], [place] [address] [phone]

## 2018-02-13 MED FILL — LOSARTAN POTASSIUM 50 MG TA: 50 | 30 days supply | Qty: 30 | Fill #0

## 2018-02-13 MED FILL — AMLODIPINE BESYLATE 10 MG T: 10 | 30 days supply | Qty: 30 | Fill #0

## 2018-02-13 MED FILL — PRAVASTATIN NA 40 MG TAB: 40 | 30 days supply | Qty: 30 | Fill #0

## 2018-06-18 MED FILL — AMLODIPINE BESYLATE 10 MG T: 10 | 30 days supply | Qty: 30 | Fill #1

## 2018-06-18 MED FILL — LOSARTAN POTASSIUM 50 MG TA: 50 | 30 days supply | Qty: 30 | Fill #1

## 2018-06-18 MED FILL — PRAVASTATIN NA 40 MG TAB: 40 | 30 days supply | Qty: 30 | Fill #1

## 2018-07-02 ENCOUNTER — Encounter (HOSPITAL_BASED_OUTPATIENT_CLINIC_OR_DEPARTMENT_OTHER): Payer: Self-pay | Admitting: Emergency Medicine

## 2018-07-02 ENCOUNTER — Other Ambulatory Visit: Payer: Self-pay

## 2018-07-02 ENCOUNTER — Emergency Department (HOSPITAL_BASED_OUTPATIENT_CLINIC_OR_DEPARTMENT_OTHER)
Admission: EM | Admit: 2018-07-02 | Discharge: 2018-07-02 | Disposition: A | Discharge status: Home / Self Care | Payer: Self-pay | Attending: Emergency Medicine | Admitting: Emergency Medicine

## 2018-07-02 ENCOUNTER — Emergency Department (HOSPITAL_BASED_OUTPATIENT_CLINIC_OR_DEPARTMENT_OTHER): Payer: Self-pay

## 2018-07-02 DIAGNOSIS — Y999 Unspecified external cause status: Secondary | ICD-10-CM | POA: Insufficient documentation

## 2018-07-02 DIAGNOSIS — Z79899 Other long term (current) drug therapy: Secondary | ICD-10-CM | POA: Insufficient documentation

## 2018-07-02 DIAGNOSIS — X501XXA Overexertion from prolonged static or awkward postures, initial encounter: Secondary | ICD-10-CM | POA: Insufficient documentation

## 2018-07-02 DIAGNOSIS — T1490XA Injury, unspecified, initial encounter: Secondary | ICD-10-CM

## 2018-07-02 DIAGNOSIS — I10 Essential (primary) hypertension: Secondary | ICD-10-CM | POA: Insufficient documentation

## 2018-07-02 DIAGNOSIS — Y9289 Other specified places as the place of occurrence of the external cause: Secondary | ICD-10-CM | POA: Insufficient documentation

## 2018-07-02 DIAGNOSIS — Y9389 Activity, other specified: Secondary | ICD-10-CM | POA: Insufficient documentation

## 2018-07-02 DIAGNOSIS — S46912A Strain of unspecified muscle, fascia and tendon at shoulder and upper arm level, left arm, initial encounter: Secondary | ICD-10-CM | POA: Insufficient documentation

## 2018-07-02 IMAGING — CR DG THORACIC SPINE 3V
3 series · 3 of 3 positions shown · non-contrast
Comparison: Chest x-ray [DATE]

CLINICAL DATA: Injury, back pain

EXAM:
THORACIC SPINE - 3 VIEWS

[w t-spine a.p. *]
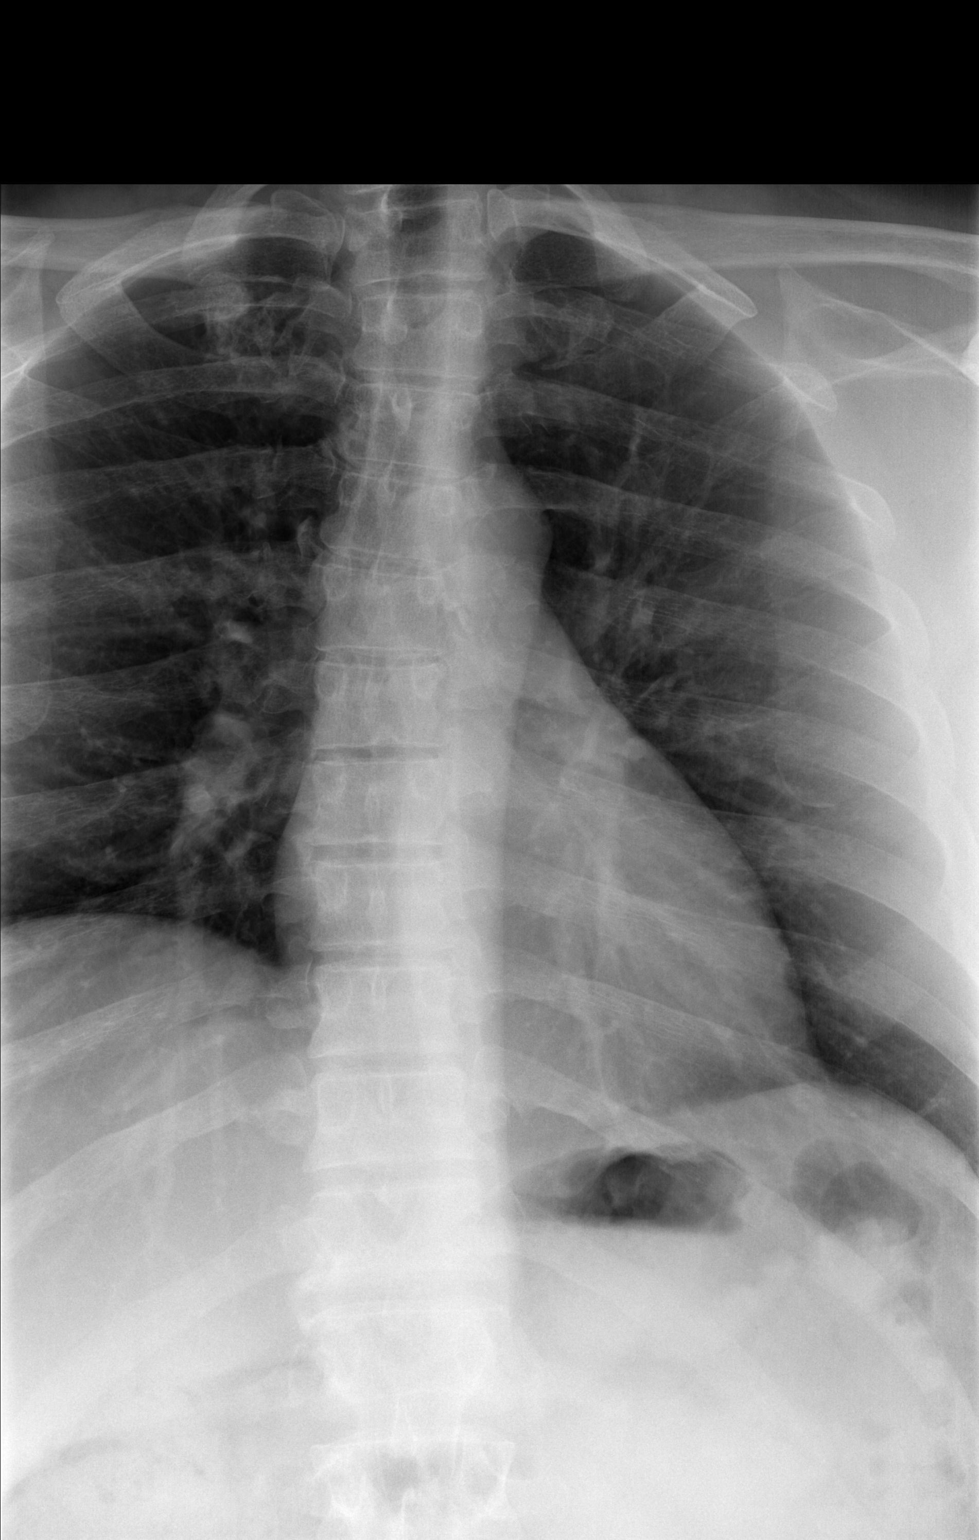

[w t-spine lat *]
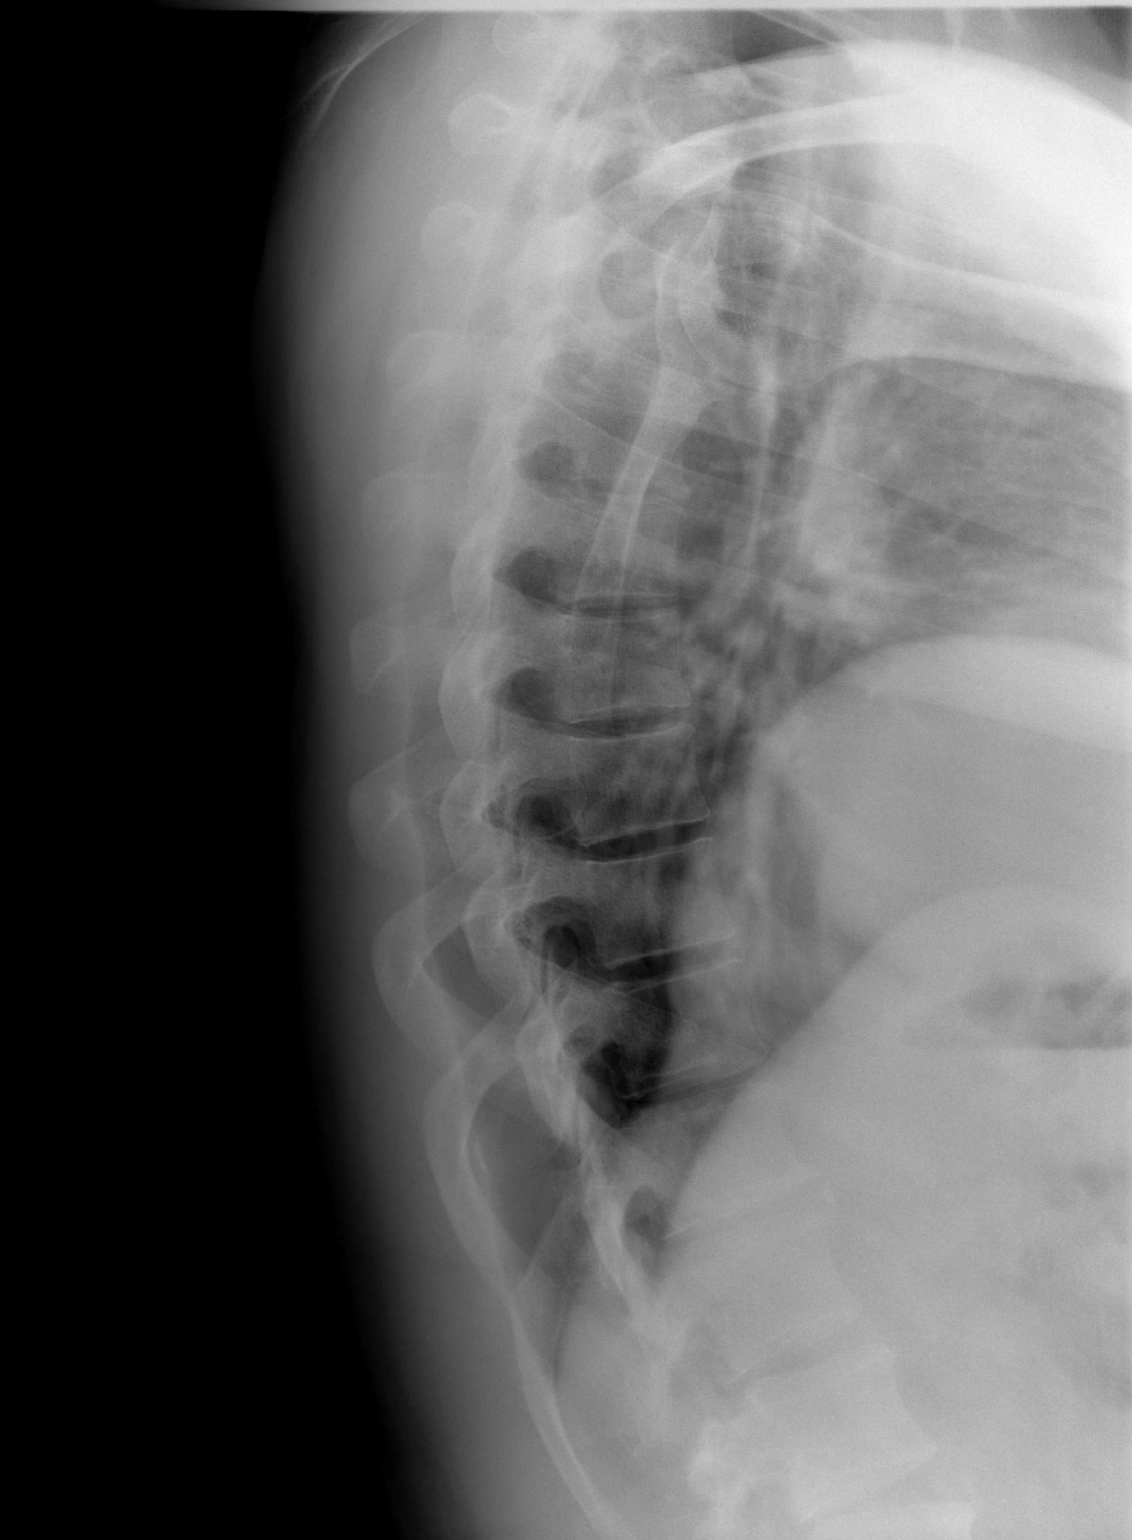

[w swimmers view]
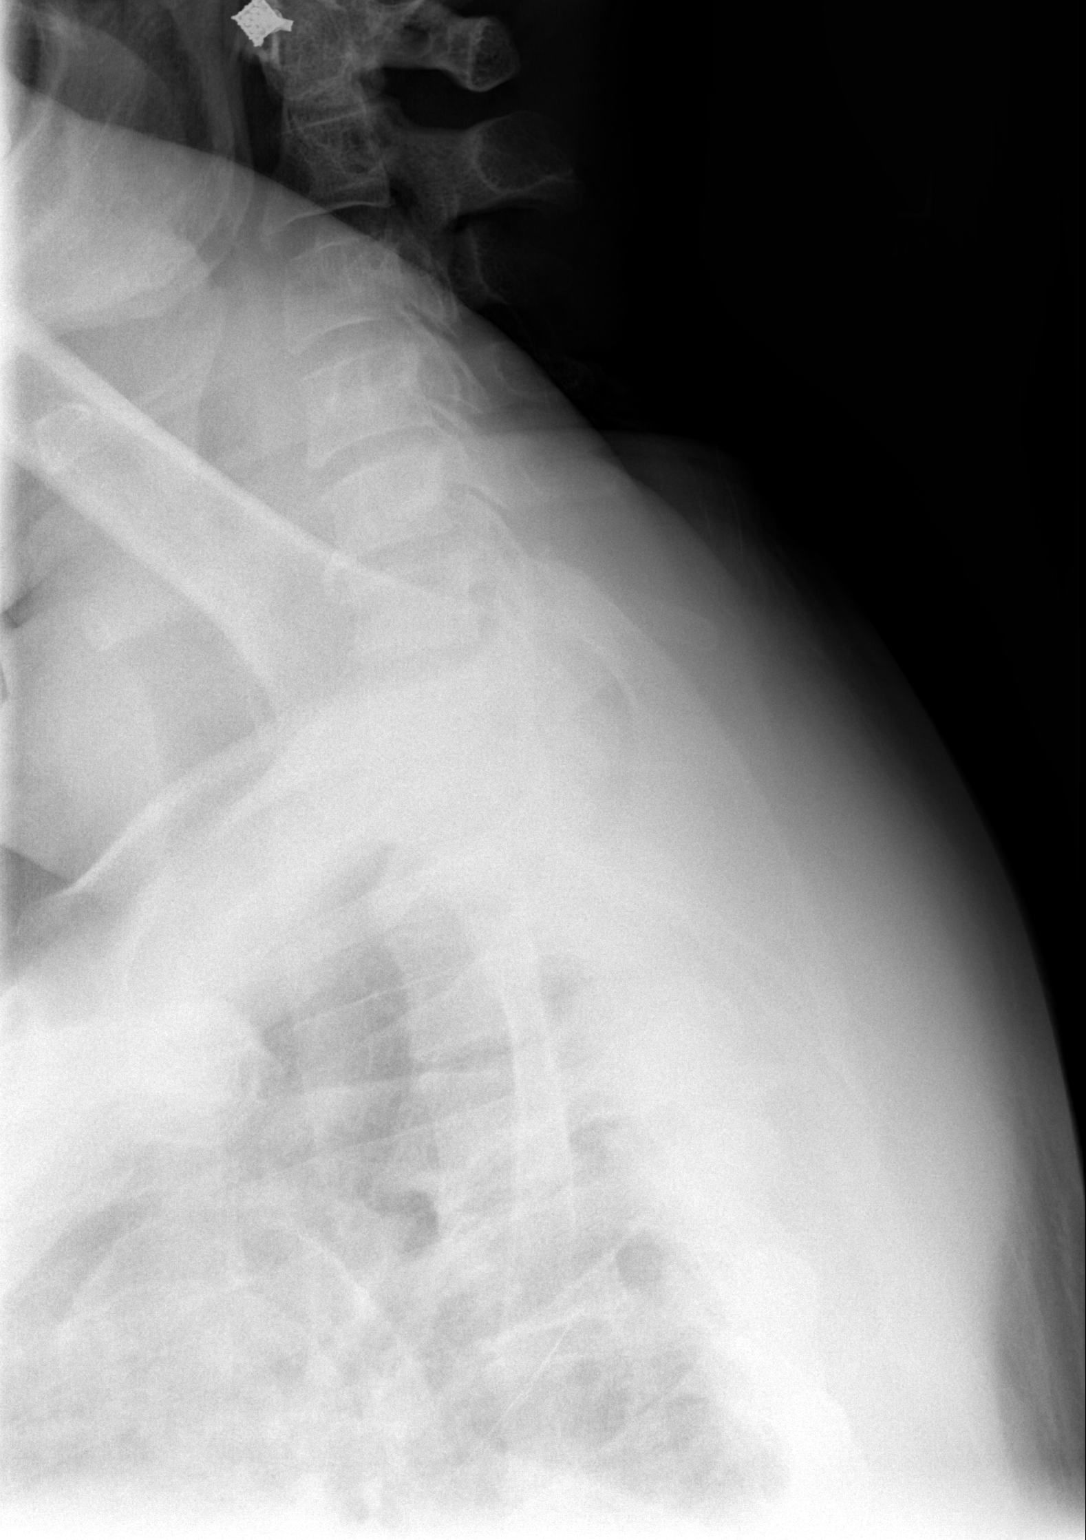

[3 of 3 positions shown; findings below may reference images not displayed]

FINDINGS: Slight rightward scoliosis in the midthoracic spine. No fracture or
malalignment. Disc spaces maintained.
IMPRESSION: No acute bony abnormality.

## 2018-07-02 IMAGING — CR DG SHOULDER 2+V*L*
3 series · 3 of 3 positions shown · non-contrast
Comparison: None.

CLINICAL DATA: Car hood fell on shoulder with pain, initial
encounter

EXAM:
LEFT SHOULDER - 2+ VIEW

[w shoulder grashey left]
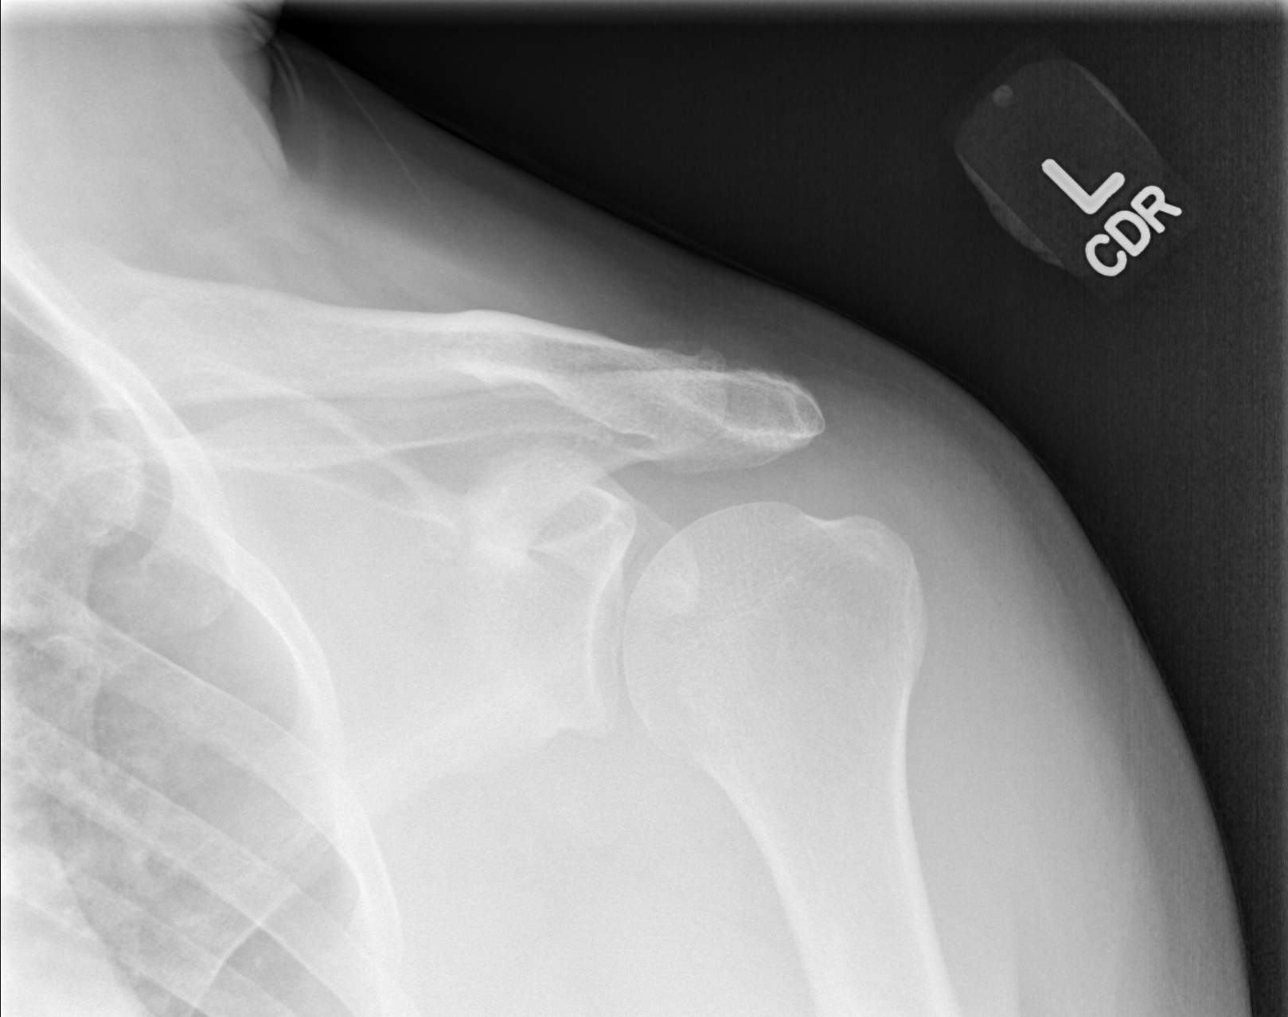

[w shoulder y view left *]
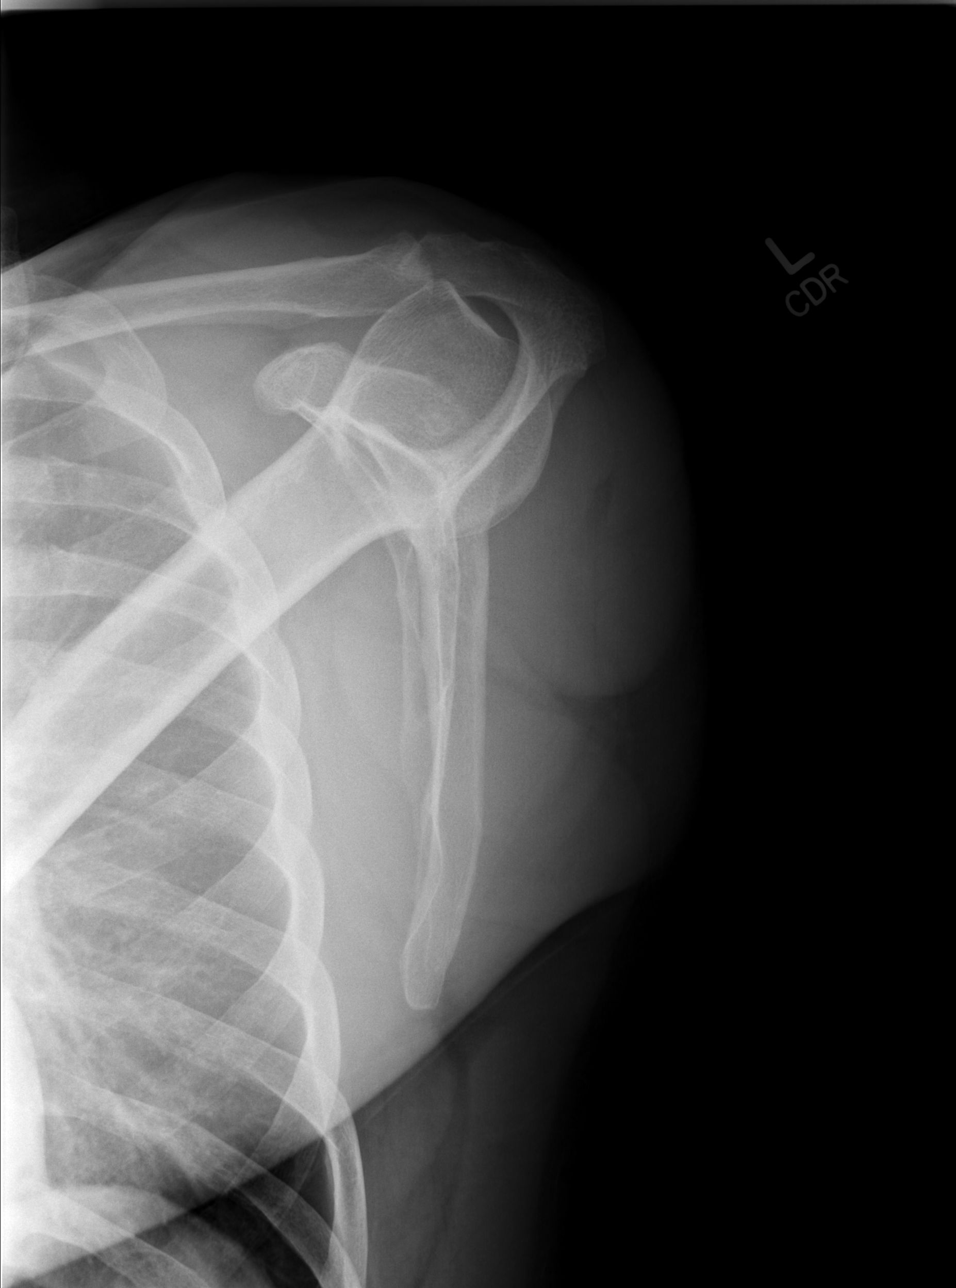

[x shoulder axillary left *]
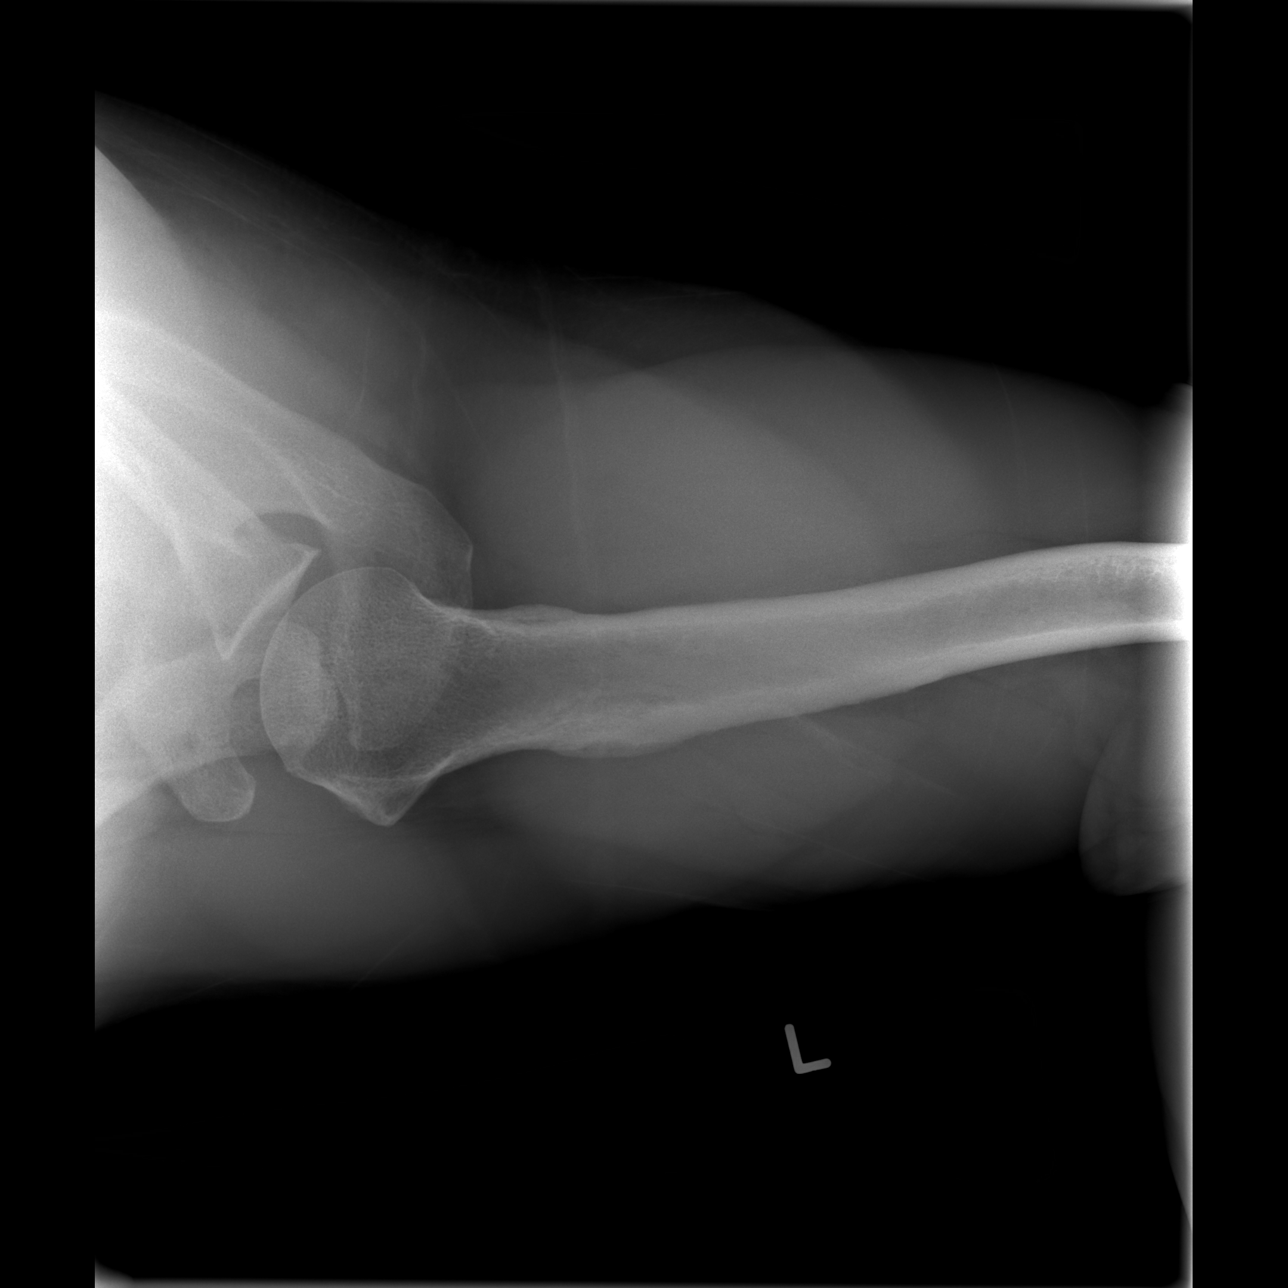

[3 of 3 positions shown; findings below may reference images not displayed]

FINDINGS: Degenerative changes of the acromioclavicular joint are noted. No
acute fracture or dislocation is seen. No soft tissue abnormality is
noted.
IMPRESSION: No acute abnormality noted.

## 2018-07-02 MED ORDER — NAPROXEN 500 MG PO TABS: 500.0000 mg | ORAL_TABLET | Freq: Two times a day (BID) | ORAL | 0 refills | Status: DC

## 2018-07-02 MED ORDER — OXYCODONE-ACETAMINOPHEN 5-325 MG PO TABS: 1.0000 | ORAL_TABLET | Freq: Four times a day (QID) | ORAL | 0 refills | Status: DC | PRN

## 2018-07-02 NOTE — ED Provider Notes (Signed)
[facility] EMERGENCY DEPARTMENT Provider Note   CSN: [id number] Arrival date & time: 07/02/18  0135     History   Chief Complaint Chief Complaint  Patient presents with  . Shoulder Pain    HPI Michael Fleming is a 48 y.o. male.  Patient is a 48 year old male with past medical history of hypertension and seizures.  He presents today with complaints of pain in his left shoulder.  He states that he was working on an old car attempting to remove a part at the junkyard when he slipped and wrenched his shoulder awkwardly.  Is been hurting since then.  He has taken hydrocodone from a previous prescription, however this has not helped.  He denies any numbness or tingling.  The history is provided by the patient.  Shoulder Pain  Location:  Shoulder Shoulder location:  L shoulder Injury: yes   Pain details:    Quality:  Aching   Radiates to:  Does not radiate   Severity:  Severe   Onset quality:  Sudden   Timing:  Constant   Progression:  Worsening   Past Medical History:  Diagnosis Date  . Hypertension   . Seizures (HCC)    last seizure age 56    Patient Active Problem List   Diagnosis Date Noted  . Pain in right wrist 03/27/2017  . Chronic pain of left knee 03/27/2017  . HTN (hypertension) 10/23/2016  . History of brain surgery 10/23/2016  . Chronic headache 10/23/2016    Past Surgical History:  Procedure Laterality Date  . BRAIN SURGERY     1981 blot clot removed  . FOOT SURGERY     right bunion removed        Home Medications    Prior to Admission medications   Medication Sig Start Date End Date Taking? Authorizing Provider  amLODipine (NORVASC) 10 MG tablet Take 1 tablet (10 mg total) by mouth daily. 02/05/18   Kallie Locks, FNP  ibuprofen (ADVIL,MOTRIN) 800 MG tablet Take 800 mg by mouth as needed. 05/05/17   [provider]  losartan (COZAAR) 50 MG tablet Take 1 tablet (50 mg total) by mouth daily. 02/05/18   Kallie Locks,  FNP  meloxicam (MOBIC) 7.5 MG tablet Take 1 tablet (7.5 mg total) by mouth 2 (two) times daily as needed for pain. Patient not taking: Reported on 02/05/2018 12/05/17   Kallie Locks, FNP  pravastatin (PRAVACHOL) 40 MG tablet Take 1 tablet (40 mg total) by mouth daily. 02/05/18   Kallie Locks, FNP    Family History Family History  Problem Relation Age of Onset  . Hypertension Father     Social History Social History   Tobacco Use  . Smoking status: Never Smoker  . Smokeless tobacco: Never Used  Substance Use Topics  . Alcohol use: Yes    Comment: occasional  . Drug use: No     Allergies   Patient has no known allergies.   Review of Systems Review of Systems  All other systems reviewed and are negative.    Physical Exam Updated Vital Signs BP (!) 144/97 (BP Location: Right Arm)   Pulse 81   Temp 98.2 F (36.8 C) (Oral)   Resp 17   Ht 5\' 7"  (1.702 m)   Wt 93 kg   SpO2 99%   BMI 32.11 kg/m   Physical Exam Vitals signs and nursing note reviewed.  Constitutional:      Appearance: Normal appearance.  HENT:  Head: Normocephalic.  Pulmonary:     Effort: Pulmonary effort is normal.     Breath sounds: Normal breath sounds.  Musculoskeletal:     Comments: The left there is tenderness to palpation over the posterior aspect.  The shoulder appears grossly normal.  He has pain with abduction and external rotation.  Ulnar and radial pulses are easily palpable.  He is able to flex, extend, and oppose all fingers and sensation is intact throughout the entire hand.  Neurological:     Mental Status: He is alert.      ED Treatments / Results  Labs (all labs ordered are listed, but only abnormal results are displayed) Labs Reviewed - No data to display  EKG None  Radiology Dg Thoracic Spine W/swimmers  Result Date: 07/02/2018 CLINICAL DATA:  Injury, back pain EXAM: THORACIC SPINE - 3 VIEWS COMPARISON:  Chest x-ray 05/19/2017 FINDINGS: Slight rightward  scoliosis in the midthoracic spine. No fracture or malalignment. Disc spaces maintained. IMPRESSION: No acute bony abnormality. Electronically Signed   By: Charlett Nose M.D.   On: 07/02/2018 02:38   Dg Shoulder Left  Result Date: 07/02/2018 CLINICAL DATA:  Car hood fell on shoulder with pain, initial encounter EXAM: LEFT SHOULDER - 2+ VIEW COMPARISON:  None. FINDINGS: Degenerative changes of the acromioclavicular joint are noted. No acute fracture or dislocation is seen. No soft tissue abnormality is noted. IMPRESSION: No acute abnormality noted. Electronically Signed   By: Alcide Clever M.D.   On: 07/02/2018 02:33    Procedures Procedures (including critical care time)  Medications Ordered in ED Medications - No data to display   Initial Impression / Assessment and Plan / ED Course  I have reviewed the triage vital signs and the nursing notes.  Pertinent labs & imaging results that were available during my care of the patient were reviewed by me and considered in my medical decision making (see chart for details).  X-rays are negative.  This is likely a shoulder strain, possibly a rotator cuff tendinitis.  He will be treated with anti-inflammatories, pain medicine, and follow-up with his primary doctor.  Final Clinical Impressions(s) / ED Diagnoses   Final diagnoses:  Injury    ED Discharge Orders    None       Geoffery Lyons, MD 07/02/18 [phone]

## 2018-07-02 NOTE — ED Notes (Signed)
Pt. returned from XR. 

## 2018-07-02 NOTE — Discharge Instructions (Signed)
Naproxen as prescribed.  Percocet as prescribed as needed for pain not relieved with naproxen.  Follow-up with your primary doctor if symptoms or not improving in the next week.

## 2018-07-02 NOTE — ED Triage Notes (Signed)
Pt states he was working on a vehicle and the hood fell onto his L shoulder and upper back. Reports full ROM but painful. Injury occurred on Tuesday. States he is taking hydrocodone that he had left over from another issue without relief.

## 2018-08-06 ENCOUNTER — Ambulatory Visit (INDEPENDENT_AMBULATORY_CARE_PROVIDER_SITE_OTHER): Payer: Self-pay | Admitting: Family Medicine

## 2018-08-06 ENCOUNTER — Encounter: Payer: Self-pay | Admitting: Family Medicine

## 2018-08-06 ENCOUNTER — Other Ambulatory Visit: Payer: Self-pay

## 2018-08-06 VITALS — BP 144/82 | HR 80 | Temp 97.8°F | Ht 67.0 in | Wt 216.0 lb

## 2018-08-06 DIAGNOSIS — Z09 Encounter for follow-up examination after completed treatment for conditions other than malignant neoplasm: Secondary | ICD-10-CM

## 2018-08-06 DIAGNOSIS — Z0000 Encounter for general adult medical examination without abnormal findings: Secondary | ICD-10-CM

## 2018-08-06 DIAGNOSIS — Z131 Encounter for screening for diabetes mellitus: Secondary | ICD-10-CM

## 2018-08-06 DIAGNOSIS — I10 Essential (primary) hypertension: Secondary | ICD-10-CM

## 2018-08-06 DIAGNOSIS — H538 Other visual disturbances: Secondary | ICD-10-CM | POA: Insufficient documentation

## 2018-08-06 DIAGNOSIS — M549 Dorsalgia, unspecified: Secondary | ICD-10-CM

## 2018-08-06 LAB — POCT GLYCOSYLATED HEMOGLOBIN (HGB A1C): Hemoglobin A1C: 5.6 % (ref 4.0–5.6)

## 2018-08-06 LAB — POCT URINALYSIS DIP (MANUAL ENTRY)
Bilirubin, UA: NEGATIVE
Blood, UA: NEGATIVE
Glucose, UA: NEGATIVE mg/dL
Ketones, POC UA: NEGATIVE mg/dL
Leukocytes, UA: NEGATIVE
Nitrite, UA: NEGATIVE
Protein Ur, POC: NEGATIVE mg/dL
Spec Grav, UA: 1.02 (ref 1.010–1.025)
Urobilinogen, UA: 0.2 E.U./dL
pH, UA: 5.5 (ref 5.0–8.0)

## 2018-08-06 NOTE — Progress Notes (Signed)
[facility]  Established Patient Office Visit  Subjective:  Patient ID: Michael Fleming, male    DOB: Jun 27, 1970  Age: 48 y.o. MRN: [id number]  CC:  Chief Complaint  Patient presents with  . Follow-up    chronic condition     HPI Michael Fleming is a 48 year old male who presents for follow up today.   Past Medical History:  Diagnosis Date  . Hypertension   . Seizures (HCC)    last seizure age 48   Current Status: Since his last office visit, he has had an ED on 2/102020 from a back sprain. He did suffer mild back pain. He has been taking Naproxen for pain relief. Today he is doing well with no complaints. He has not had any seizures lately. He denies visual changes, chest pain, cough, shortness of breath, heart palpitations, and falls. He has occasional headaches and dizziness with position changes. Denies severe headaches, confusion, seizures, double vision, and blurred vision, nausea and vomiting.  He denies fevers, chills, fatigue, recent infections, weight loss, and night sweats. No reports of GI problems such as diarrhea, and constipation. He has no reports of blood in stools, dysuria and hematuria. No depression or anxiety reported. He denies pain today.   Past Surgical History:  Procedure Laterality Date  . BRAIN SURGERY     1981 blot clot removed  . FOOT SURGERY     right bunion removed    Family History  Problem Relation Age of Onset  . Hypertension Father     Social History   Socioeconomic History  . Marital status: Single    Spouse name: Not on file  . Number of children: Not on file  . Years of education: Not on file  . Highest education level: Not on file  Occupational History  . Not on file  Social Needs  . Financial resource strain: Not on file  . Food insecurity:    Worry: Not on file    Inability: Not on file  . Transportation needs:    Medical: Not on file    Non-medical: Not on file   Tobacco Use  . Smoking status: Never Smoker  . Smokeless tobacco: Never Used  Substance and Sexual Activity  . Alcohol use: Yes    Comment: occasional  . Drug use: No  . Sexual activity: Not on file  Lifestyle  . Physical activity:    Days per week: Not on file    Minutes per session: Not on file  . Stress: Not on file  Relationships  . Social connections:    Talks on phone: Not on file    Gets together: Not on file    Attends religious service: Not on file    Active member of club or organization: Not on file    Attends meetings of clubs or organizations: Not on file    Relationship status: Not on file  . Intimate partner violence:    Fear of current or ex partner: Not on file    Emotionally abused: Not on file    Physically abused: Not on file    Forced sexual activity: Not on file  Other Topics Concern  . Not on file  Social History Narrative  . Not on file    Outpatient Medications Prior to Visit  Medication Sig Dispense Refill  . amLODipine (NORVASC) 10 MG tablet Take 1 tablet (10 mg total) by mouth daily. 30 tablet  6  . ibuprofen (ADVIL,MOTRIN) 800 MG tablet Take 800 mg by mouth as needed.  1  . losartan (COZAAR) 50 MG tablet Take 1 tablet (50 mg total) by mouth daily. 30 tablet 6  . pravastatin (PRAVACHOL) 40 MG tablet Take 1 tablet (40 mg total) by mouth daily. 30 tablet 6  . naproxen (NAPROSYN) 500 MG tablet Take 1 tablet (500 mg total) by mouth 2 (two) times daily with a meal. 20 tablet 0  . oxyCODONE-acetaminophen (PERCOCET) 5-325 MG tablet Take 1-2 tablets by mouth every 6 (six) hours as needed. 10 tablet 0  . meloxicam (MOBIC) 7.5 MG tablet Take 1 tablet (7.5 mg total) by mouth 2 (two) times daily as needed for pain. (Patient not taking: Reported on 02/05/2018) 30 tablet 2   No facility-administered medications prior to visit.     No Known Allergies  ROS Review of Systems  Constitutional: Negative.   HENT: Negative.   Eyes: Positive for visual  disturbance (blurry vision).  Respiratory: Negative.   Cardiovascular: Negative.   Gastrointestinal: Negative.   Endocrine: Negative.   Genitourinary: Negative.   Musculoskeletal: Positive for back pain (chronic).  Skin: Negative.   Allergic/Immunologic: Negative.   Neurological: Positive for dizziness and headaches.  Hematological: Negative.   Psychiatric/Behavioral: Negative.    Objective:    Physical Exam  Constitutional: He is oriented to person, place, and time. He appears well-developed and well-nourished.  HENT:  Head: Normocephalic and atraumatic.  Eyes: Conjunctivae are normal.  Neck: Normal range of motion. Neck supple.  Cardiovascular: Normal rate, regular rhythm, normal heart sounds and intact distal pulses.  Pulmonary/Chest: Effort normal and breath sounds normal.  Abdominal: Soft. Bowel sounds are normal.  Musculoskeletal: Normal range of motion.  Neurological: He is alert and oriented to person, place, and time. He has normal reflexes.  Skin: Skin is warm and dry.  Psychiatric: He has a normal mood and affect. His behavior is normal. Judgment and thought content normal.  Nursing note and vitals reviewed.   BP (!) 144/82   Pulse 80   Temp 97.8 F (36.6 C) (Oral)   Ht 5\' 7"  (1.702 m)   Wt 216 lb (98 kg)   SpO2 98%   BMI 33.83 kg/m  Wt Readings from Last 3 Encounters:  08/06/18 216 lb (98 kg)  07/02/18 205 lb (93 kg)  02/05/18 209 lb (94.8 kg)     Health Maintenance Due  Topic Date Due  . HIV Screening  09/03/1983   There are no preventive care reminders to display for this patient.  Lab Results  Component Value Date   TSH 0.54 09/29/2016   Lab Results  Component Value Date   WBC 5.0 09/29/2016   HGB 13.0 (L) 09/29/2016   HCT 38.9 09/29/2016   MCV 83.8 09/29/2016   PLT 273 09/29/2016   Lab Results  Component Value Date   NA 140 09/29/2016   K 4.0 09/29/2016   CO2 25 09/29/2016   GLUCOSE 93 09/29/2016   BUN 13 09/29/2016   CREATININE  1.18 09/29/2016   BILITOT 0.5 09/29/2016   ALKPHOS 61 09/29/2016   AST 21 09/29/2016   ALT 21 09/29/2016   PROT 7.0 09/29/2016   ALBUMIN 4.2 09/29/2016   CALCIUM 9.3 09/29/2016   Lab Results  Component Value Date   CHOL 214 (H) 06/07/2017   Lab Results  Component Value Date   HDL 43 06/07/2017   Lab Results  Component Value Date   LDLCALC 154 (H) 06/07/2017  Lab Results  Component Value Date   TRIG 87 06/07/2017   Lab Results  Component Value Date   CHOLHDL 5.0 06/07/2017   Lab Results  Component Value Date   HGBA1C 5.6 08/06/2018    Assessment & Plan:   1. Essential hypertension Blood pressure is stable today at 144/82 today. He will continue Amlodipine and Losartan as prescribed. He will continue to decrease high sodium intake, excessive alcohol intake, increase potassium intake, smoking cessation, and increase physical activity of at least 30 minutes of cardio activity daily. He will continue to follow Heart Healthy or DASH diet.  2. Back pain, unspecified back location, unspecified back pain laterality, unspecified chronicity He will continue Motrin as needed for mild back pain.   3. Blurry vision He continues to have financial problems with getting previously written Rx for glasses. He will continue to pursue. - Ambulatory referral to Ophthalmology  4. Screening for diabetes mellitus Hgb A1c is within normal range of 5.6 today. He will continue to decrease foods/beverages high in sugars and carbs and follow Heart Healthy or DASH diet. Increase physical activity to at least 30 minutes cardio exercise daily.  - POCT glycosylated hemoglobin (Hb A1C) - POCT urinalysis dipstick  5. Healthcare maintenance - CBC with Differential - Comprehensive metabolic panel; Future - Lipid Panel - PSA - Vitamin D, 25-hydroxy - Vitamin B12  6. Follow up He will follow up in 6 months.   No orders of the defined types were placed in this encounter.    Referral Orders      Ambulatory referral to Ophthalmology   Raliegh Ip,  MSN, FNP-C [facility] [address] [place], [place] [address] [phone]   Problem List Items Addressed This Visit      Cardiovascular and Mediastinum   HTN (hypertension) - Primary    Other Visit Diagnoses    Back pain, unspecified back location, unspecified back pain laterality, unspecified chronicity       Blurry vision       Relevant Orders   Ambulatory referral to Ophthalmology   Screening for diabetes mellitus       Relevant Orders   POCT glycosylated hemoglobin (Hb A1C) (Completed)   POCT urinalysis dipstick (Completed)   Healthcare maintenance       Relevant Orders   CBC with Differential   Comprehensive metabolic panel   Lipid Panel   PSA   Vitamin D, 25-hydroxy   Vitamin B12   Follow up          No orders of the defined types were placed in this encounter.   Follow-up: Return in about 6 months (around 02/06/2019).    Kallie Locks, FNP

## 2018-08-07 ENCOUNTER — Other Ambulatory Visit: Payer: Self-pay | Admitting: Family Medicine

## 2018-08-07 DIAGNOSIS — E559 Vitamin D deficiency, unspecified: Secondary | ICD-10-CM

## 2018-08-07 DIAGNOSIS — E785 Hyperlipidemia, unspecified: Secondary | ICD-10-CM

## 2018-08-07 LAB — CBC WITH DIFFERENTIAL/PLATELET
Basophils Absolute: 0 10*3/uL (ref 0.0–0.2)
Basos: 1 %
EOS (ABSOLUTE): 0.1 10*3/uL (ref 0.0–0.4)
Eos: 2 %
Hematocrit: 41.1 % (ref 37.5–51.0)
Hemoglobin: 13.9 g/dL (ref 13.0–17.7)
Immature Grans (Abs): 0 10*3/uL (ref 0.0–0.1)
Immature Granulocytes: 0 %
Lymphocytes Absolute: 2.4 10*3/uL (ref 0.7–3.1)
Lymphs: 47 %
MCH: 27.8 pg (ref 26.6–33.0)
MCHC: 33.8 g/dL (ref 31.5–35.7)
MCV: 82 fL (ref 79–97)
Monocytes Absolute: 0.4 10*3/uL (ref 0.1–0.9)
Monocytes: 7 %
Neutrophils Absolute: 2.2 10*3/uL (ref 1.4–7.0)
Neutrophils: 43 %
Platelets: 309 10*3/uL (ref 150–450)
RBC: 5 x10E6/uL (ref 4.14–5.80)
RDW: 12.9 % (ref 11.6–15.4)
WBC: 5.2 10*3/uL (ref 3.4–10.8)

## 2018-08-07 LAB — LIPID PANEL
Chol/HDL Ratio: 5.7 ratio — ABNORMAL HIGH (ref 0.0–5.0)
Cholesterol, Total: 224 mg/dL — ABNORMAL HIGH (ref 100–199)
HDL: 39 mg/dL — ABNORMAL LOW (ref >39)
LDL Calculated: 165 mg/dL — ABNORMAL HIGH (ref 0–99)
Triglycerides: 101 mg/dL (ref 0–149)
VLDL Cholesterol Cal: 20 mg/dL (ref 5–40)

## 2018-08-07 LAB — VITAMIN D 25 HYDROXY (VIT D DEFICIENCY, FRACTURES): Vit D, 25-Hydroxy: 17.3 ng/mL — ABNORMAL LOW (ref 30.0–100.0)

## 2018-08-07 LAB — VITAMIN B12: Vitamin B-12: 577 pg/mL (ref 232–1245)

## 2018-08-07 LAB — PSA: Prostate Specific Ag, Serum: 0.5 ng/mL (ref 0.0–4.0)

## 2018-08-07 MED ORDER — PRAVASTATIN SODIUM 80 MG PO TABS: 80.0000 mg | ORAL_TABLET | Freq: Every day | ORAL | 3 refills | Status: DC

## 2018-08-07 MED ORDER — VITAMIN D (ERGOCALCIFEROL) 1.25 MG (50000 UNIT) PO CAPS: 50000.0000 [IU] | ORAL_CAPSULE | ORAL | 3 refills | Status: DC

## 2018-10-13 ENCOUNTER — Emergency Department (HOSPITAL_BASED_OUTPATIENT_CLINIC_OR_DEPARTMENT_OTHER)
Admission: EM | Admit: 2018-10-13 | Discharge: 2018-10-14 | Disposition: A | Discharge status: Home / Self Care | Payer: Self-pay | Attending: Emergency Medicine | Admitting: Emergency Medicine

## 2018-10-13 ENCOUNTER — Other Ambulatory Visit: Payer: Self-pay

## 2018-10-13 DIAGNOSIS — S161XXA Strain of muscle, fascia and tendon at neck level, initial encounter: Secondary | ICD-10-CM | POA: Insufficient documentation

## 2018-10-13 DIAGNOSIS — Y999 Unspecified external cause status: Secondary | ICD-10-CM | POA: Insufficient documentation

## 2018-10-13 DIAGNOSIS — Y939 Activity, unspecified: Secondary | ICD-10-CM | POA: Insufficient documentation

## 2018-10-13 DIAGNOSIS — X58XXXA Exposure to other specified factors, initial encounter: Secondary | ICD-10-CM | POA: Insufficient documentation

## 2018-10-13 DIAGNOSIS — I10 Essential (primary) hypertension: Secondary | ICD-10-CM | POA: Insufficient documentation

## 2018-10-13 DIAGNOSIS — Z79899 Other long term (current) drug therapy: Secondary | ICD-10-CM | POA: Insufficient documentation

## 2018-10-13 DIAGNOSIS — Y929 Unspecified place or not applicable: Secondary | ICD-10-CM | POA: Insufficient documentation

## 2018-10-14 ENCOUNTER — Other Ambulatory Visit: Payer: Self-pay

## 2018-10-14 ENCOUNTER — Emergency Department (HOSPITAL_BASED_OUTPATIENT_CLINIC_OR_DEPARTMENT_OTHER): Payer: Self-pay

## 2018-10-14 ENCOUNTER — Encounter (HOSPITAL_BASED_OUTPATIENT_CLINIC_OR_DEPARTMENT_OTHER): Payer: Self-pay | Admitting: Emergency Medicine

## 2018-10-14 IMAGING — CR CERVICAL SPINE - COMPLETE 4+ VIEW
5 series · 5 of 5 positions shown · non-contrast
Comparison: None.

CLINICAL DATA: 48 y/o  M; 3 weeks of posterior cervical spine pain.

EXAM:
CERVICAL SPINE - COMPLETE 4+ VIEW

[w c-spine lat *]
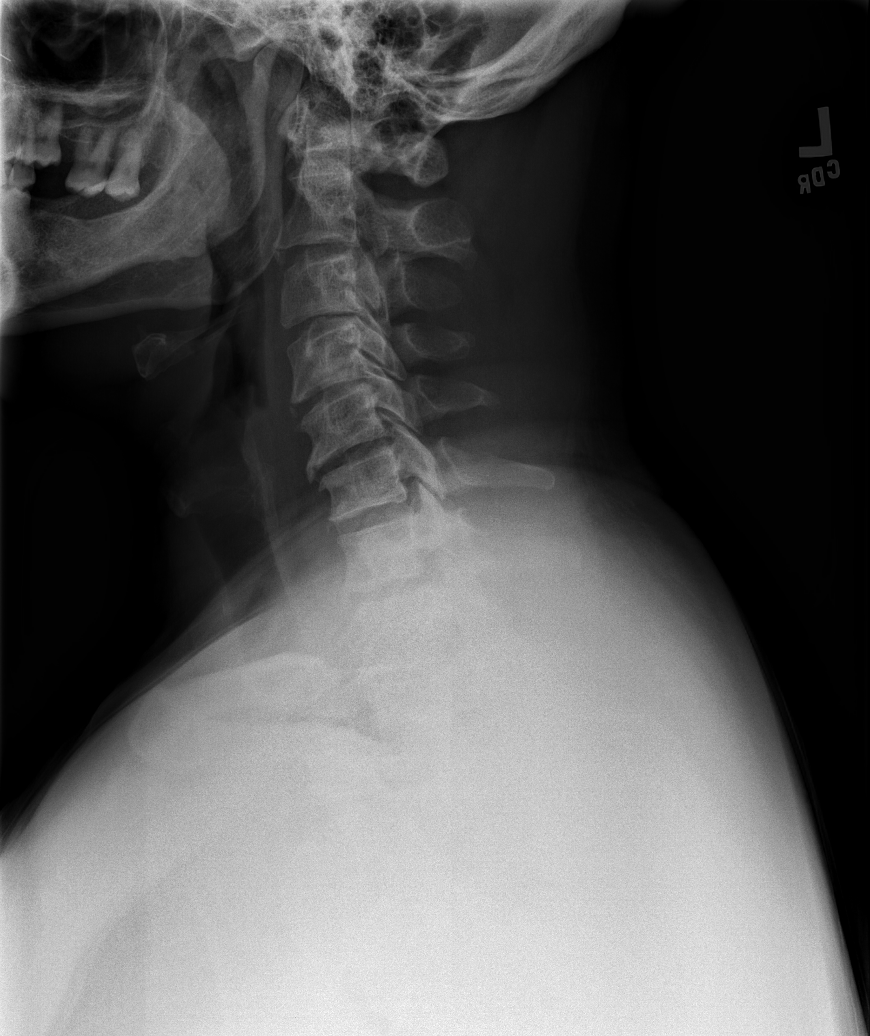

[w c-spine oblique * (1 of 2)]
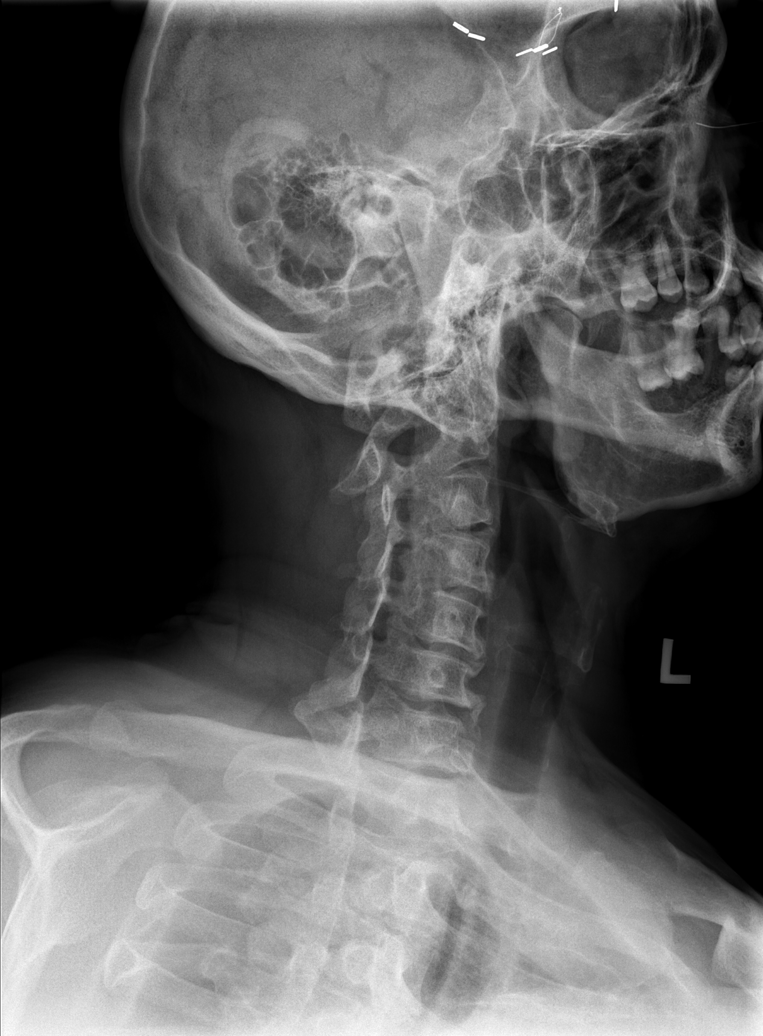

[w c-spine oblique * (2 of 2)]
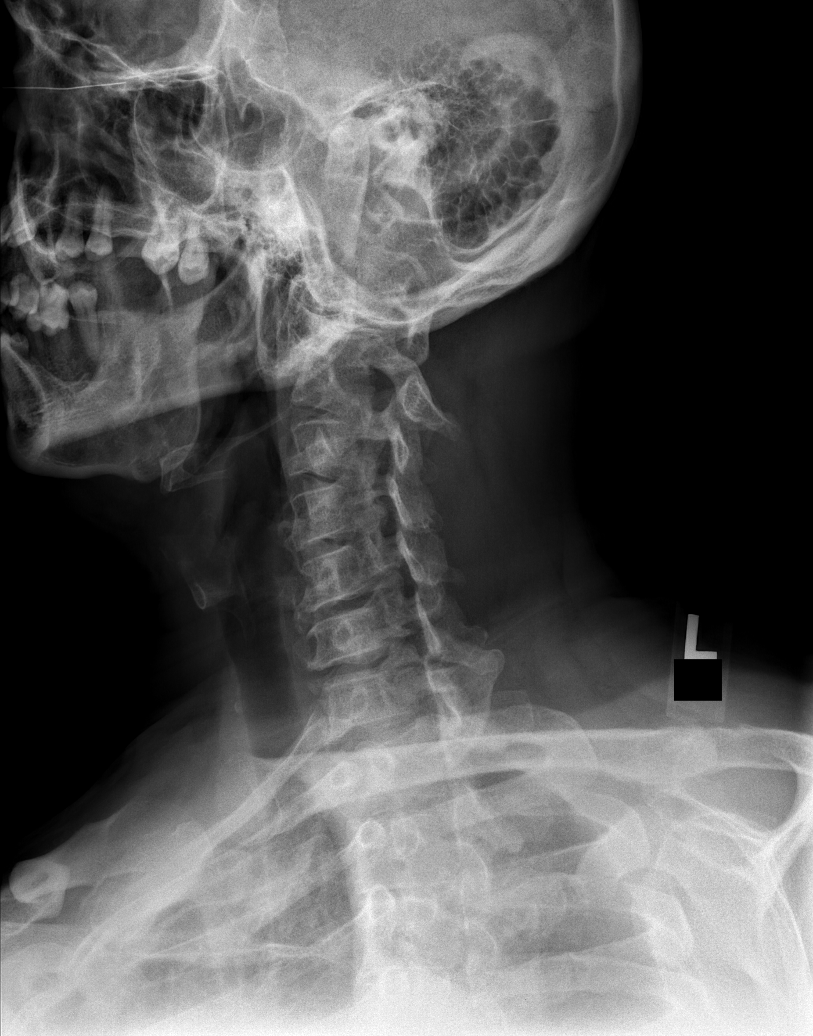

[w c-spine a.p. *]
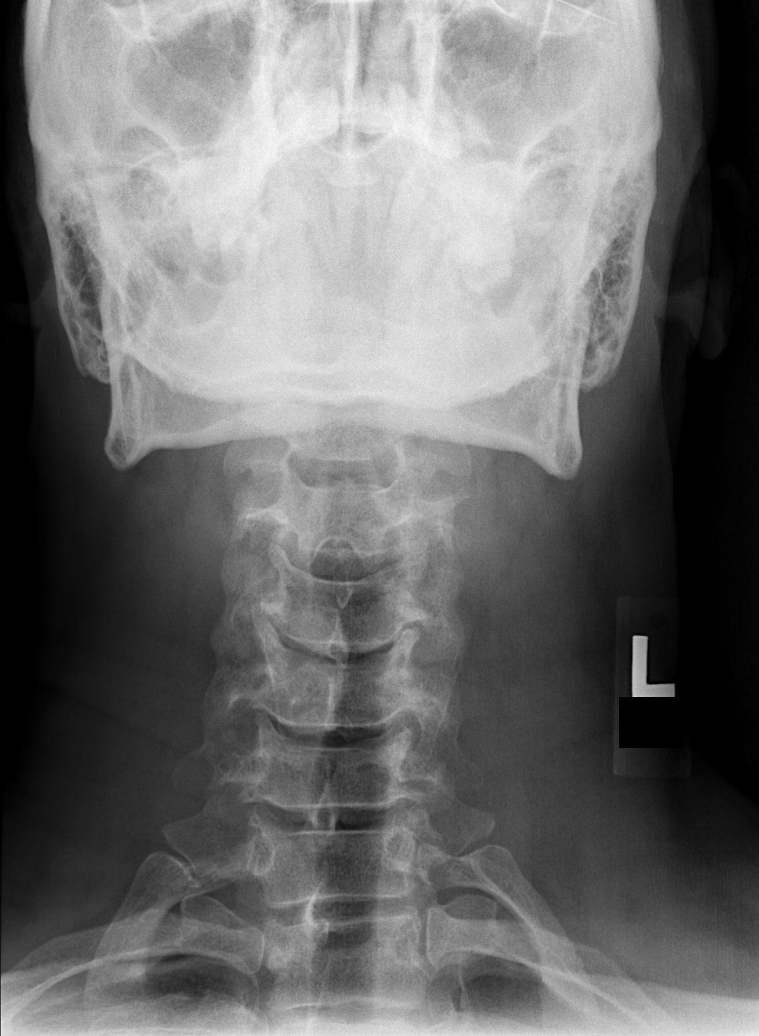

[w c-spine odontoid *]
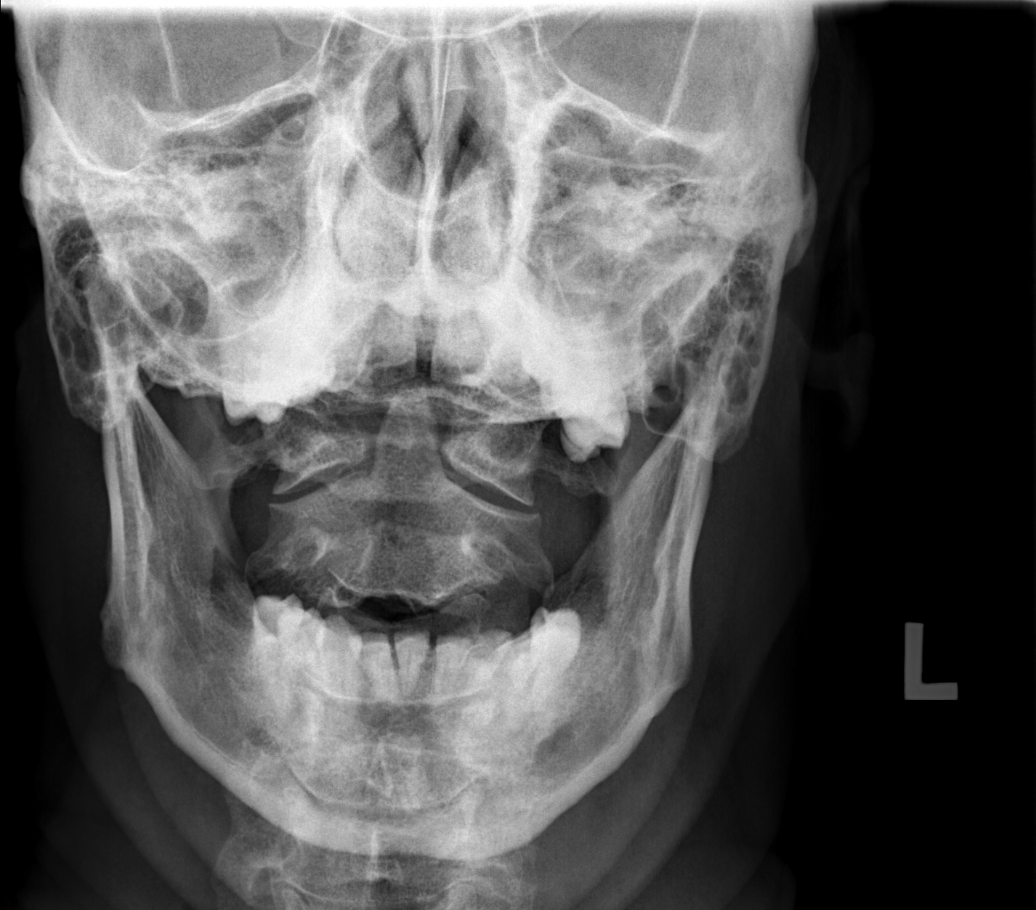

[5 of 5 positions shown; findings below may reference images not displayed]

FINDINGS: There is no evidence of cervical spine fracture or prevertebral soft
tissue swelling. Alignment is normal. Multilevel discogenic
degenerative changes are present greatest at the C4-C6 levels where
there is mild loss of intervertebral disc space height and small
endplate marginal osteophytes. Uncovertebral hypertrophy encroaches
on the neural foramen at the left C4-T1 levels and the right C4-5
level.
IMPRESSION: 1. No osseous abnormality or malalignment.
2. Cervical spondylosis greatest at the C4-C6 levels.

## 2018-10-14 MED ORDER — DEXAMETHASONE SODIUM PHOSPHATE 10 MG/ML IJ SOLN
4.0000 mg | Freq: Once | INTRAMUSCULAR | Status: AC
  Administered 2018-10-14: 4 mg via INTRAMUSCULAR
  Filled 2018-10-14: qty 1

## 2018-10-14 MED ORDER — LIDOCAINE 5 % EX PTCH: 1.0000 | MEDICATED_PATCH | CUTANEOUS | 0 refills | Status: DC

## 2018-10-14 MED ORDER — METHOCARBAMOL 500 MG PO TABS: 500.0000 mg | ORAL_TABLET | Freq: Two times a day (BID) | ORAL | 0 refills | Status: DC

## 2018-10-14 MED ORDER — ACETAMINOPHEN 500 MG PO TABS
1000.0000 mg | ORAL_TABLET | Freq: Once | ORAL | Status: AC
  Administered 2018-10-14: 1000 mg via ORAL
  Filled 2018-10-14: qty 2

## 2018-10-14 MED ORDER — DICLOFENAC SODIUM ER 100 MG PO TB24: 100.0000 mg | ORAL_TABLET | Freq: Every day | ORAL | 0 refills | Status: DC

## 2018-10-14 MED ORDER — METHOCARBAMOL 500 MG PO TABS
1000.0000 mg | ORAL_TABLET | Freq: Once | ORAL | Status: AC
  Administered 2018-10-14: 1000 mg via ORAL
  Filled 2018-10-14: qty 2

## 2018-10-14 NOTE — ED Triage Notes (Signed)
Patient states neck pain started approximately 3 weeks prior. Patient states he's now sure how it started, he woke with it. Patient states it is both right and left pain. Patient states he is unaware of anything that makes it better.

## 2018-10-14 NOTE — ED Provider Notes (Signed)
[facility] EMERGENCY DEPARTMENT Provider Note   CSN: [id number] Arrival date & time: 10/13/18  2352    History   Chief Complaint Chief Complaint  Patient presents with  . Neck Pain    x 3weeks, patient unaware of precipitating event    HPI Michael Fleming is a 48 y.o. male.     The history is provided by the patient.  Neck Pain  Pain location:  R side Quality:  Cramping Pain radiates to:  Does not radiate Pain severity:  Moderate Pain is:  Same all the time Onset quality:  Gradual Duration:  3 weeks Progression:  Unchanged Context: not fall, not jumping from heights, not lifting a heavy object, not MCA, not MVC, not pedestrian accident and not recent injury   Relieved by:  Nothing Worsened by:  Nothing Ineffective treatments:  NSAIDs (hasn't had any in several days) Associated symptoms: no bladder incontinence, no bowel incontinence, no chest pain, no fever, no headaches, no leg pain, no numbness, no paresis, no photophobia, no syncope, no tingling, no visual change, no weakness and no weight loss   Risk factors: no hx of head and neck radiation     Past Medical History:  Diagnosis Date  . Hypertension   . Seizures (HCC)    last seizure age 48    Patient Active Problem List   Diagnosis Date Noted  . Back pain 08/06/2018  . Blurry vision 08/06/2018  . Pain in right wrist 03/27/2017  . Chronic pain of left knee 03/27/2017  . HTN (hypertension) 10/23/2016  . History of brain surgery 10/23/2016  . Chronic headache 10/23/2016    Past Surgical History:  Procedure Laterality Date  . BRAIN SURGERY     1981 blot clot removed  . FOOT SURGERY     right bunion removed        Home Medications    Prior to Admission medications   Medication Sig Start Date End Date Taking? Authorizing Provider  amLODipine (NORVASC) 10 MG tablet Take 1 tablet (10 mg total) by mouth daily. 02/05/18  Yes Kallie LocksStroud, Natalie M, FNP  ibuprofen (ADVIL,MOTRIN) 800 MG tablet  Take 800 mg by mouth as needed. 05/05/17  Yes [provider]  losartan (COZAAR) 50 MG tablet Take 1 tablet (50 mg total) by mouth daily. 02/05/18  Yes Kallie LocksStroud, Natalie M, FNP  pravastatin (PRAVACHOL) 80 MG tablet Take 1 tablet (80 mg total) by mouth daily. 08/07/18  Yes Kallie LocksStroud, Natalie M, FNP  Diclofenac Sodium CR 100 MG 24 hr tablet Take 1 tablet (100 mg total) by mouth daily. 10/14/18   Garyn Arlotta, MD  lidocaine (LIDODERM) 5 % Place 1 patch onto the skin daily. Remove & Discard patch within 12 hours or as directed by MD 10/14/18   Nicanor AlconPalumbo, Margaree Sandhu, MD  methocarbamol (ROBAXIN) 500 MG tablet Take 1 tablet (500 mg total) by mouth 2 (two) times daily. 10/14/18   Jahanna Raether, MD  Vitamin D, Ergocalciferol, (DRISDOL) 1.25 MG (50000 UT) CAPS capsule Take 1 capsule (50,000 Units total) by mouth every 7 (seven) days. 08/07/18   Kallie LocksStroud, Natalie M, FNP    Family History Family History  Problem Relation Age of Onset  . Hypertension Father     Social History Social History   Tobacco Use  . Smoking status: Never Smoker  . Smokeless tobacco: Never Used  Substance Use Topics  . Alcohol use: Yes    Comment: occasional  . Drug use: No     Allergies  Patient has no known allergies.   Review of Systems Review of Systems  Constitutional: Negative for fever and weight loss.  Eyes: Negative for photophobia.  Respiratory: Negative for cough and shortness of breath.   Cardiovascular: Negative for chest pain and syncope.  Gastrointestinal: Negative for abdominal pain and bowel incontinence.  Genitourinary: Negative for bladder incontinence.  Musculoskeletal: Positive for neck pain. Negative for neck stiffness.  Neurological: Negative for tingling, weakness, numbness and headaches.  All other systems reviewed and are negative.    Physical Exam Updated Vital Signs BP 140/84 (BP Location: Right Arm)   Pulse 90   Temp 98.1 F (36.7 C) (Oral)   Resp 18   Ht  (1.702 m)   Wt 95.3  kg   SpO2 98%   BMI 32.89 kg/m   Physical Exam Vitals signs and nursing note reviewed.  Constitutional:      General: He is not in acute distress.    Appearance: He is normal weight.  HENT:     Head: Normocephalic and atraumatic.     Nose: Nose normal.  Eyes:     Conjunctiva/sclera: Conjunctivae normal.     Pupils: Pupils are equal, round, and reactive to light.  Neck:     Musculoskeletal: Normal range of motion and neck supple. No neck rigidity or muscular tenderness.  Cardiovascular:     Rate and Rhythm: Normal rate and regular rhythm.     Pulses: Normal pulses.     Heart sounds: Normal heart sounds.  Pulmonary:     Effort: Pulmonary effort is normal.     Breath sounds: Normal breath sounds.  Abdominal:     General: Abdomen is flat. Bowel sounds are normal.     Tenderness: There is no abdominal tenderness. There is no guarding or rebound.  Musculoskeletal: Normal range of motion.     Right shoulder: Normal.     Left shoulder: Normal.     Cervical back: Normal.     Thoracic back: Normal.  Lymphadenopathy:     Cervical: No cervical adenopathy.  Skin:    General: Skin is warm and dry.     Capillary Refill: Capillary refill takes less than 2 seconds.  Neurological:     General: No focal deficit present.     Mental Status: He is alert and oriented to person, place, and time.  Psychiatric:        Mood and Affect: Mood normal.        Behavior: Behavior normal.      ED Treatments / Results  Labs (all labs ordered are listed, but only abnormal results are displayed) Labs Reviewed - No data to display  EKG None  Radiology Results for orders placed or performed in visit on 08/06/18  CBC with Differential  Result Value Ref Range   WBC 5.2 3.4 - 10.8 x10E3/uL   RBC 5.00 4.14 - 5.80 x10E6/uL   Hemoglobin 13.9 13.0 - 17.7 g/dL   Hematocrit 16.1 09.6 - 51.0 %   MCV 82 79 - 97 fL   MCH 27.8 26.6 - 33.0 pg   MCHC 33.8 31.5 - 35.7 g/dL   RDW 04.5 40.9 - 81.1 %    Platelets 309 150 - 450 x10E3/uL   Neutrophils 43 Not Estab. %   Lymphs 47 Not Estab. %   Monocytes 7 Not Estab. %   Eos 2 Not Estab. %   Basos 1 Not Estab. %   Neutrophils Absolute 2.2 1.4 - 7.0 x10E3/uL  Lymphocytes Absolute 2.4 0.7 - 3.1 x10E3/uL   Monocytes Absolute 0.4 0.1 - 0.9 x10E3/uL   EOS (ABSOLUTE) 0.1 0.0 - 0.4 x10E3/uL   Basophils Absolute 0.0 0.0 - 0.2 x10E3/uL   Immature Granulocytes 0 Not Estab. %   Immature Grans (Abs) 0.0 0.0 - 0.1 x10E3/uL  Lipid Panel  Result Value Ref Range   Cholesterol, Total 224 (H) 100 - 199 mg/dL   Triglycerides 161 0 - 149 mg/dL   HDL 39 (L) >09 mg/dL   VLDL Cholesterol Cal 20 5 - 40 mg/dL   LDL Calculated 604 (H) 0 - 99 mg/dL   Chol/HDL Ratio 5.7 (H) 0.0 - 5.0 ratio  PSA  Result Value Ref Range   Prostate Specific Ag, Serum 0.5 0.0 - 4.0 ng/mL  Vitamin D, 25-hydroxy  Result Value Ref Range   Vit D, 25-Hydroxy 17.3 (L) 30.0 - 100.0 ng/mL  Vitamin B12  Result Value Ref Range   Vitamin B-12 577 232 - 1,245 pg/mL  POCT glycosylated hemoglobin (Hb A1C)  Result Value Ref Range   Hemoglobin A1C 5.6 4.0 - 5.6 %   HbA1c POC (<> result, manual entry)     HbA1c, POC (prediabetic range)     HbA1c, POC (controlled diabetic range)    POCT urinalysis dipstick  Result Value Ref Range   Color, UA yellow yellow   Clarity, UA clear clear   Glucose, UA negative negative mg/dL   Bilirubin, UA negative negative   Ketones, POC UA negative negative mg/dL   Spec Grav, UA 5.409 8.119 - 1.025   Blood, UA negative negative   pH, UA 5.5 5.0 - 8.0   Protein Ur, POC negative negative mg/dL   Urobilinogen, UA 0.2 0.2 or 1.0 E.U./dL   Nitrite, UA Negative Negative   Leukocytes, UA Negative Negative   Dg Cervical Spine Complete  Result Date: 10/14/2018 CLINICAL DATA:  48 y/o  M; 3 weeks of posterior cervical spine pain. EXAM: CERVICAL SPINE - COMPLETE 4+ VIEW COMPARISON:  None. FINDINGS: There is no evidence of cervical spine fracture or prevertebral  soft tissue swelling. Alignment is normal. Multilevel discogenic degenerative changes are present greatest at the C4-C6 levels where there is mild loss of intervertebral disc space height and small endplate marginal osteophytes. Uncovertebral hypertrophy encroaches on the neural foramen at the left C4-T1 levels and the right C4-5 level. IMPRESSION: 1. No osseous abnormality or malalignment. 2. Cervical spondylosis greatest at the C4-C6 levels. Electronically Signed   By: Mitzi Hansen M.D.   On: 10/14/2018 00:33    Procedures Procedures (including critical care time)  Medications Ordered in ED Medications  methocarbamol (ROBAXIN) tablet 1,000 mg (1,000 mg Oral Given 10/14/18 0022)  dexamethasone (DECADRON) injection 4 mg (4 mg Intramuscular Given 10/14/18 0022)  acetaminophen (TYLENOL) tablet 1,000 mg (1,000 mg Oral Given 10/14/18 0021)       Final Clinical Impressions(s) / ED Diagnoses   Final diagnoses:  Strain of neck muscle, initial encounter      Return for intractable cough, coughing up blood,fevers >100.4 unrelieved by medication, shortness of breath, intractable vomiting, chest pain, shortness of breath, weakness,numbness, changes in speech, facial asymmetry,abdominal pain, passing out,Inability to tolerate liquids or food, cough, altered mental status or any concerns. No signs of systemic illness or infection. The patient is nontoxic-appearing on exam and vital signs are within normal limits.   I have reviewed the triage vital signs and the nursing notes. Pertinent labs &imaging results that were available during my care of  the patient were reviewed by me and considered in my medical decision making (see chart for details).  After history, exam, and medical workup I feel the patient has been appropriately medically screened and is safe for discharge home. Pertinent diagnoses were discussed with the patient. Patient was given return precautions  ED Discharge  Orders         Ordered    Diclofenac Sodium CR 100 MG 24 hr tablet  Daily     10/14/18 0031    methocarbamol (ROBAXIN) 500 MG tablet  2 times daily     10/14/18 0031    lidocaine (LIDODERM) 5 %  Every 24 hours     10/14/18 0031           Tyannah Sane, MD 10/14/18 1610

## 2018-11-13 MED FILL — ?AMLODIPINE BESYLATE 10 MG: 10 | 30 days supply | Qty: 30 | Fill #2

## 2018-11-13 MED FILL — LOSARTAN POTASSIUM 50 MG TA: 50 | 30 days supply | Qty: 30 | Fill #2

## 2018-11-13 MED FILL — ?PRAVASTATIN NA 40 MG TAB: 40 | 30 days supply | Qty: 30 | Fill #2

## 2018-11-26 ENCOUNTER — Telehealth: Payer: Self-pay

## 2018-11-26 NOTE — Telephone Encounter (Signed)
Patient schedule for tomorrow.

## 2018-11-27 ENCOUNTER — Ambulatory Visit (INDEPENDENT_AMBULATORY_CARE_PROVIDER_SITE_OTHER): Payer: Self-pay | Admitting: Family Medicine

## 2018-11-27 ENCOUNTER — Other Ambulatory Visit: Payer: Self-pay

## 2018-11-27 ENCOUNTER — Encounter: Payer: Self-pay | Admitting: Family Medicine

## 2018-11-27 VITALS — BP 136/90 | HR 80 | Temp 98.9°F | Ht 67.0 in | Wt 214.0 lb

## 2018-11-27 DIAGNOSIS — W57XXXA Bitten or stung by nonvenomous insect and other nonvenomous arthropods, initial encounter: Secondary | ICD-10-CM

## 2018-11-27 DIAGNOSIS — I10 Essential (primary) hypertension: Secondary | ICD-10-CM

## 2018-11-27 DIAGNOSIS — Z09 Encounter for follow-up examination after completed treatment for conditions other than malignant neoplasm: Secondary | ICD-10-CM

## 2018-11-27 DIAGNOSIS — S30861A Insect bite (nonvenomous) of abdominal wall, initial encounter: Secondary | ICD-10-CM | POA: Insufficient documentation

## 2018-11-27 NOTE — Progress Notes (Signed)
[facility]   Sick Visit  Subjective:  Patient ID: Michael Fleming, male    DOB: [birth year]  Age: 48 y.o. MRN: [id number]  CC:  Chief Complaint  Patient presents with  . Insect Bite    Tick in genital area     HPI Michael Fleming is a 48 year old male who presents for Sick Visit today.   Past Medical History:  Diagnosis Date  . Hypertension   . Seizures ([place])    last seizure age 55   Current Status: Since his last office visit, he is doing well with no complaints. He discovered tick bite yesterday in his groin area. He previous thought it was a 'bump' and tick began to crawl. He had previously been fishing this past weekend. He is asymptomatic today, and has not used any ointments or creams on the area. He denies visual changes, chest pain, cough, shortness of breath, heart palpitations, and falls. He has occasional headaches and dizziness with position changes. Denies severe headaches, confusion, seizures, double vision, and blurred vision, nausea and vomiting. He denies fevers, chills, fatigue, recent infections, weight loss, and night sweats. No reports of GI problems such as diarrhea, and constipation. He has no reports of blood in stools, dysuria and hematuria. No depression or anxiety reported. He denies pain today.   Past Surgical History:  Procedure Laterality Date  . BRAIN SURGERY     1981 blot clot removed  . FOOT SURGERY     right bunion removed    Family History  Problem Relation Age of Onset  . Hypertension Father     Social History   Socioeconomic History  . Marital status: Single    Spouse name: Not on file  . Number of children: Not on file  . Years of education: Not on file  . Highest education level: Not on file  Occupational History  . Not on file  Social Needs  . Financial resource strain: Not on file  . Food insecurity    Worry: Not on file    Inability: Not on file  . Transportation needs   Medical: Not on file    Non-medical: Not on file  Tobacco Use  . Smoking status: Never Smoker  . Smokeless tobacco: Never Used  Substance and Sexual Activity  . Alcohol use: Yes    Comment: occasional  . Drug use: No  . Sexual activity: Not on file  Lifestyle  . Physical activity    Days per week: Not on file    Minutes per session: Not on file  . Stress: Not on file  Relationships  . Social Herbalist on phone: Not on file    Gets together: Not on file    Attends religious service: Not on file    Active member of club or organization: Not on file    Attends meetings of clubs or organizations: Not on file    Relationship status: Not on file  . Intimate partner violence    Fear of current or ex partner: Not on file    Emotionally abused: Not on file    Physically abused: Not on file    Forced sexual activity: Not on file  Other Topics Concern  . Not on file  Social History Narrative  . Not on file    Outpatient Medications Prior to Visit  Medication Sig Dispense Refill  . amLODipine (NORVASC) 10 MG tablet Take 1  tablet (10 mg total) by mouth daily. 30 tablet 6  . lidocaine (LIDODERM) 5 % Place 1 patch onto the skin daily. Remove & Discard patch within 12 hours or as directed by MD 14 patch 0  . losartan (COZAAR) 50 MG tablet Take 1 tablet (50 mg total) by mouth daily. 30 tablet 6  . methocarbamol (ROBAXIN) 500 MG tablet Take 1 tablet (500 mg total) by mouth 2 (two) times daily. 20 tablet 0  . pravastatin (PRAVACHOL) 80 MG tablet Take 1 tablet (80 mg total) by mouth daily. 90 tablet 3  . Vitamin D, Ergocalciferol, (DRISDOL) 1.25 MG (50000 UT) CAPS capsule Take 1 capsule (50,000 Units total) by mouth every 7 (seven) days. 5 capsule 3  . Diclofenac Sodium CR 100 MG 24 hr tablet Take 1 tablet (100 mg total) by mouth daily. (Patient not taking: Reported on 11/27/2018) 10 tablet 0  . ibuprofen (ADVIL,MOTRIN) 800 MG tablet Take 800 mg by mouth as needed.  1   No  facility-administered medications prior to visit.     No Known Allergies  ROS Review of Systems  Constitutional: Negative.   HENT: Negative.   Eyes: Negative.   Respiratory: Negative.   Cardiovascular: Negative.   Gastrointestinal: Negative.   Endocrine: Negative.   Genitourinary: Negative.   Musculoskeletal: Negative.   Skin: Negative.        Small tick bite in genital area  Allergic/Immunologic: Negative.   Neurological: Positive for dizziness (occasional) and headaches (Occasional ).  Hematological: Negative.   Psychiatric/Behavioral: Negative.    Objective:    Physical Exam  Constitutional: He is oriented to person, place, and time. He appears well-developed and well-nourished.  HENT:  Head: Normocephalic and atraumatic.  Eyes: Conjunctivae are normal.  Neck: Normal range of motion. Neck supple.  Cardiovascular: Normal rate, regular rhythm, normal heart sounds and intact distal pulses.  Pulmonary/Chest: Effort normal and breath sounds normal.  Abdominal: Soft. Bowel sounds are normal.  Musculoskeletal: Normal range of motion.  Neurological: He is alert and oriented to person, place, and time. He has normal reflexes.  Skin: Skin is warm and dry.  Psychiatric: He has a normal mood and affect. His behavior is normal. Judgment and thought content normal.  Nursing note and vitals reviewed.   BP 136/90   Pulse 80   Temp 98.9 F (37.2 C) (Oral)   Ht 5\' 7"  (1.702 m)   Wt 214 lb (97.1 kg)   SpO2 100%   BMI 33.52 kg/m  Wt Readings from Last 3 Encounters:  11/27/18 214 lb (97.1 kg)  10/14/18 210 lb (95.3 kg)  08/06/18 216 lb (98 kg)     Health Maintenance Due  Topic Date Due  . HIV Screening  09/03/1983  . COLONOSCOPY  09/03/2018    There are no preventive care reminders to display for this patient.  Lab Results  Component Value Date   TSH 0.54 09/29/2016   Lab Results  Component Value Date   WBC 5.2 08/06/2018   HGB 13.9 08/06/2018   HCT 41.1  08/06/2018   MCV 82 08/06/2018   PLT 309 08/06/2018   Lab Results  Component Value Date   NA 140 09/29/2016   K 4.0 09/29/2016   CO2 25 09/29/2016   GLUCOSE 93 09/29/2016   BUN 13 09/29/2016   CREATININE 1.18 09/29/2016   BILITOT 0.5 09/29/2016   ALKPHOS 61 09/29/2016   AST 21 09/29/2016   ALT 21 09/29/2016   PROT 7.0 09/29/2016   ALBUMIN  4.2 09/29/2016   CALCIUM 9.3 09/29/2016   Lab Results  Component Value Date   CHOL 224 (H) 08/06/2018   Lab Results  Component Value Date   HDL 39 (L) 08/06/2018   Lab Results  Component Value Date   LDLCALC 165 (H) 08/06/2018   Lab Results  Component Value Date   TRIG 101 08/06/2018   Lab Results  Component Value Date   CHOLHDL 5.7 (H) 08/06/2018   Lab Results  Component Value Date   HGBA1C 5.6 08/06/2018   Assessment & Plan:   1. Tick bite of groin, initial encounter - Lyme Disease, Western Blot  2. Essential hypertension The current medical regimen is effective; blood pressure is stable at 136/90 today; continue present plan and medications as prescribed. She will continue to decrease high sodium intake, excessive alcohol intake, increase potassium intake, smoking cessation, and increase physical activity of at least 30 minutes of cardio activity daily. She will continue to follow Heart Healthy or DASH diet.  3. Follow up He will keep follow up appointment for 01/2019.  No orders of the defined types were placed in this encounter.   Orders Placed This Encounter  Procedures  . Lyme Disease, Western Blot    Referral Orders  No referral(s) requested today     Raliegh IpNatalie Shelvie Salsberry,  MSN, FNP-BC [facility] [address]  [place], [place] [id number] [phone]   Problem List Items Addressed This Visit      Cardiovascular and Mediastinum   HTN (hypertension)    Other Visit Diagnoses    Tick bite of groin, initial encounter    -  Primary   Relevant Orders   Lyme Disease,  Western Blot   Follow up          No orders of the defined types were placed in this encounter.   Follow-up: No follow-ups on file.    Kallie LocksNatalie M Mel Tadros, FNP

## 2018-11-27 NOTE — Patient Instructions (Signed)

## 2018-11-30 LAB — LYME DISEASE, WESTERN BLOT
IgG P18 Ab.: ABSENT
IgG P28 Ab.: ABSENT
IgG P30 Ab.: ABSENT
IgG P39 Ab.: ABSENT
IgG P41 Ab.: ABSENT
IgG P45 Ab.: ABSENT
IgG P58 Ab.: ABSENT
IgG P66 Ab.: ABSENT
IgG P93 Ab.: ABSENT
IgM P23 Ab.: ABSENT
IgM P39 Ab.: ABSENT
IgM P41 Ab.: ABSENT
Lyme IgG Wb: NEGATIVE
Lyme IgM Wb: NEGATIVE

## 2018-12-02 ENCOUNTER — Other Ambulatory Visit: Payer: Self-pay | Admitting: Family Medicine

## 2018-12-02 ENCOUNTER — Encounter: Payer: Self-pay | Admitting: Family Medicine

## 2018-12-02 DIAGNOSIS — R7689 Other specified abnormal immunological findings in serum: Secondary | ICD-10-CM

## 2018-12-02 DIAGNOSIS — R768 Other specified abnormal immunological findings in serum: Secondary | ICD-10-CM

## 2018-12-02 MED ORDER — DOXYCYCLINE HYCLATE 100 MG PO TABS: 100.0000 mg | ORAL_TABLET | Freq: Two times a day (BID) | ORAL | 0 refills | Status: DC

## 2018-12-22 DIAGNOSIS — E876 Hypokalemia: Secondary | ICD-10-CM

## 2018-12-22 DIAGNOSIS — R002 Palpitations: Secondary | ICD-10-CM

## 2018-12-22 HISTORY — DX: Hypokalemia: E87.6

## 2018-12-22 HISTORY — DX: Palpitations: R00.2

## 2019-01-08 ENCOUNTER — Emergency Department (HOSPITAL_BASED_OUTPATIENT_CLINIC_OR_DEPARTMENT_OTHER)
Admission: EM | Admit: 2019-01-08 | Discharge: 2019-01-08 | Disposition: A | Discharge status: Home / Self Care | Payer: Self-pay | Attending: Emergency Medicine | Admitting: Emergency Medicine

## 2019-01-08 ENCOUNTER — Encounter (HOSPITAL_BASED_OUTPATIENT_CLINIC_OR_DEPARTMENT_OTHER): Payer: Self-pay | Admitting: *Deleted

## 2019-01-08 ENCOUNTER — Emergency Department (HOSPITAL_BASED_OUTPATIENT_CLINIC_OR_DEPARTMENT_OTHER): Payer: Self-pay

## 2019-01-08 ENCOUNTER — Other Ambulatory Visit: Payer: Self-pay

## 2019-01-08 DIAGNOSIS — R002 Palpitations: Secondary | ICD-10-CM | POA: Insufficient documentation

## 2019-01-08 DIAGNOSIS — Z79899 Other long term (current) drug therapy: Secondary | ICD-10-CM | POA: Insufficient documentation

## 2019-01-08 DIAGNOSIS — I10 Essential (primary) hypertension: Secondary | ICD-10-CM | POA: Insufficient documentation

## 2019-01-08 LAB — CBC WITH DIFFERENTIAL/PLATELET
Abs Immature Granulocytes: 0.01 10*3/uL (ref 0.00–0.07)
Basophils Absolute: 0 10*3/uL (ref 0.0–0.1)
Basophils Relative: 1 %
Eosinophils Absolute: 0.2 10*3/uL (ref 0.0–0.5)
Eosinophils Relative: 2 %
HCT: 39.2 % (ref 39.0–52.0)
Hemoglobin: 13.3 g/dL (ref 13.0–17.0)
Immature Granulocytes: 0 %
Lymphocytes Relative: 49 %
Lymphs Abs: 3.1 10*3/uL (ref 0.7–4.0)
MCH: 28.4 pg (ref 26.0–34.0)
MCHC: 33.9 g/dL (ref 30.0–36.0)
MCV: 83.8 fL (ref 80.0–100.0)
Monocytes Absolute: 0.3 10*3/uL (ref 0.1–1.0)
Monocytes Relative: 6 %
Neutro Abs: 2.6 10*3/uL (ref 1.7–7.7)
Neutrophils Relative %: 42 %
Platelets: 313 10*3/uL (ref 150–400)
RBC: 4.68 MIL/uL (ref 4.22–5.81)
RDW: 11.9 % (ref 11.5–15.5)
WBC: 6.1 10*3/uL (ref 4.0–10.5)
nRBC: 0 % (ref 0.0–0.2)

## 2019-01-08 LAB — BASIC METABOLIC PANEL
Anion gap: 10 (ref 5–15)
BUN: 10 mg/dL (ref 6–20)
CO2: 26 mmol/L (ref 22–32)
Calcium: 9.3 mg/dL (ref 8.9–10.3)
Chloride: 103 mmol/L (ref 98–111)
Creatinine, Ser: 1.13 mg/dL (ref 0.61–1.24)
GFR calc Af Amer: >60 mL/min (ref >60)
GFR calc non Af Amer: >60 mL/min (ref >60)
Glucose, Bld: 115 mg/dL — ABNORMAL HIGH (ref 70–99)
Potassium: 3.4 mmol/L — ABNORMAL LOW (ref 3.5–5.1)
Sodium: 139 mmol/L (ref 135–145)

## 2019-01-08 LAB — TROPONIN I (HIGH SENSITIVITY)
Troponin I (High Sensitivity): 5 ng/L (ref <18)
Troponin I (High Sensitivity): 5 ng/L (ref <18)

## 2019-01-08 LAB — D-DIMER, QUANTITATIVE: D-Dimer, Quant: 0.31 ug/mL-FEU (ref 0.00–0.50)

## 2019-01-08 IMAGING — DX CHEST - 2 VIEW
2 series · 2 of 2 positions shown · non-contrast
Comparison: [DATE]

CLINICAL DATA: Palpitations and shortness of breath.

EXAM:
CHEST - 2 VIEW

[chest pa]
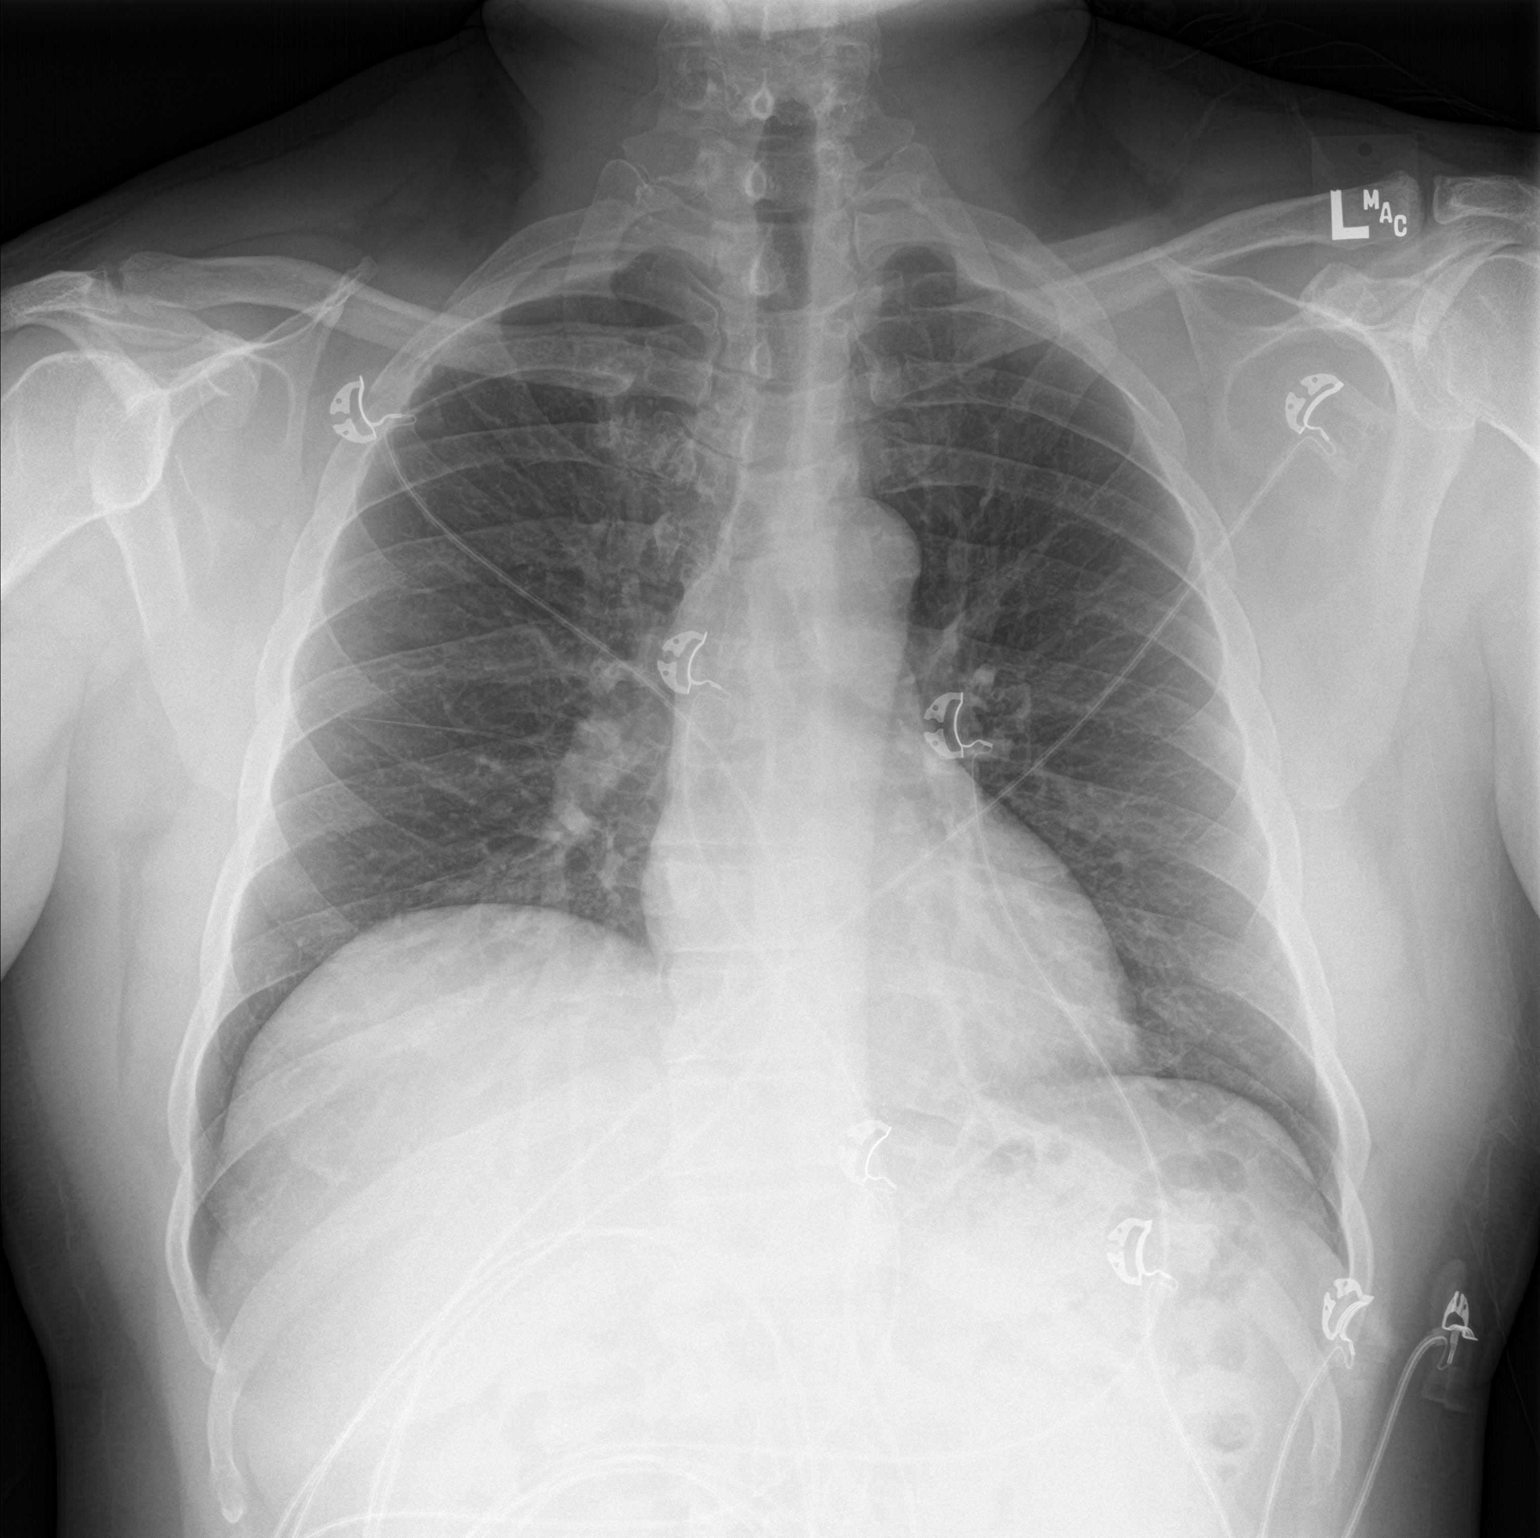

[chest lat]
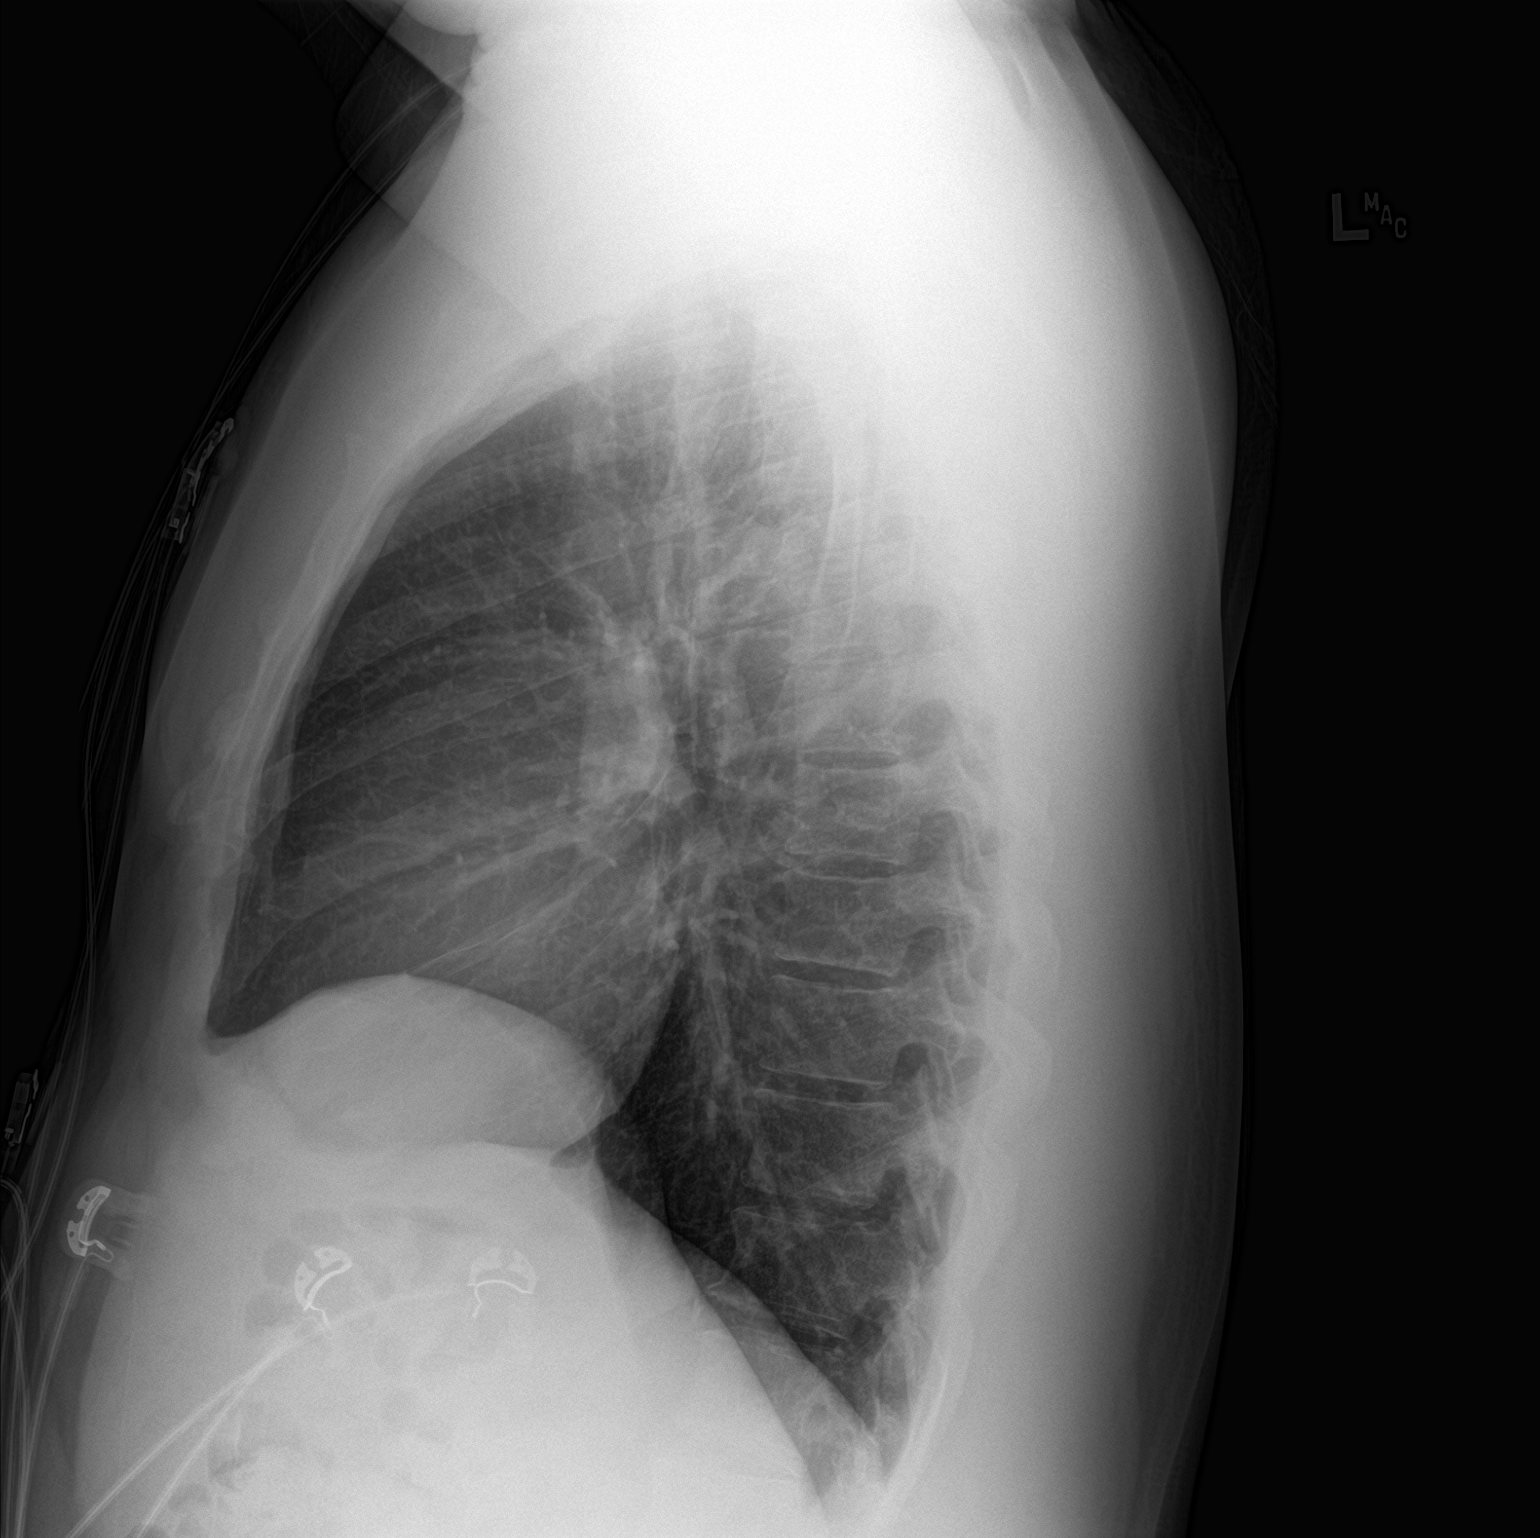

[2 of 2 positions shown; findings below may reference images not displayed]

FINDINGS: Chronic eventration of right hemidiaphragm.The cardiomediastinal
contours are normal. The lungs are clear. Pulmonary vasculature is
normal. No consolidation, pleural effusion, or pneumothorax. No
acute osseous abnormalities are seen.
IMPRESSION: Unremarkable radiographs of the chest.

## 2019-01-08 NOTE — Discharge Instructions (Addendum)
You were seen today for palpitations.  Your work-up was reassuring.  Follow-up with cardiology for possible Holter monitoring.  Avoid excessive caffeine or any drug use.

## 2019-01-08 NOTE — ED Provider Notes (Signed)
[facility] EMERGENCY DEPARTMENT Provider Note   CSN: [id number] Arrival date & time: 01/08/19  0047     History   Chief Complaint Chief Complaint  Patient presents with  . Palpitations    HPI Michael Fleming is a 48 y.o. male.     HPI  This is a 48 year old male with history of hypertension and seizures who presents with palpitations.  Patient reports that throughout the day he has had several episodes of palpitations with associated shortness of breath.  He was at work this evening when he had an episode and "I just felt bad."  No history of arrhythmias or palpitations in the past.  He denies chest pain.  He denies any significant changes in his diet, medications, alcohol or drug use, or caffeine use.  Currently he is asymptomatic.  Denies any history of thyroid disorder.  Past Medical History:  Diagnosis Date  . Hypertension   . Seizures ([facility])    last seizure age 89    Patient Active Problem List   Diagnosis Date Noted  . Tick bite of groin 11/27/2018  . Back pain 08/06/2018  . Blurry vision 08/06/2018  . Pain in right wrist 03/27/2017  . Chronic pain of left knee 03/27/2017  . HTN (hypertension) 10/23/2016  . History of brain surgery 10/23/2016  . Chronic headache 10/23/2016    Past Surgical History:  Procedure Laterality Date  . BRAIN SURGERY     1981 blot clot removed  . FOOT SURGERY     right bunion removed        Home Medications    Prior to Admission medications   Medication Sig Start Date End Date Taking? Authorizing Provider  amLODipine (NORVASC) 10 MG tablet Take 1 tablet (10 mg total) by mouth daily. 02/05/18   Kallie LocksStroud, Natalie M, FNP  Diclofenac Sodium CR 100 MG 24 hr tablet Take 1 tablet (100 mg total) by mouth daily. Patient not taking: Reported on 11/27/2018 10/14/18   Palumbo, April, MD  ibuprofen (ADVIL,MOTRIN) 800 MG tablet Take 800 mg by mouth as needed. 05/05/17   [provider]  lidocaine (LIDODERM) 5 % Place 1 patch  onto the skin daily. Remove & Discard patch within 12 hours or as directed by MD 10/14/18   Nicanor AlconPalumbo, April, MD  losartan (COZAAR) 50 MG tablet Take 1 tablet (50 mg total) by mouth daily. 02/05/18   Kallie LocksStroud, Natalie M, FNP  methocarbamol (ROBAXIN) 500 MG tablet Take 1 tablet (500 mg total) by mouth 2 (two) times daily. 10/14/18   Palumbo, April, MD  pravastatin (PRAVACHOL) 80 MG tablet Take 1 tablet (80 mg total) by mouth daily. 08/07/18   Kallie LocksStroud, Natalie M, FNP  Vitamin D, Ergocalciferol, (DRISDOL) 1.25 MG (50000 UT) CAPS capsule Take 1 capsule (50,000 Units total) by mouth every 7 (seven) days. 08/07/18   Kallie LocksStroud, Natalie M, FNP    Family History Family History  Problem Relation Age of Onset  . Hypertension Father     Social History Social History   Tobacco Use  . Smoking status: Never Smoker  . Smokeless tobacco: Never Used  Substance Use Topics  . Alcohol use: Yes    Comment: occasional  . Drug use: No     Allergies   Patient has no known allergies.   Review of Systems Review of Systems  Constitutional: Negative for fever.  Respiratory: Positive for shortness of breath.   Cardiovascular: Positive for palpitations. Negative for chest pain and leg swelling.  Gastrointestinal: Negative  for abdominal pain, nausea and vomiting.  Genitourinary: Negative for dysuria.  All other systems reviewed and are negative.    Physical Exam Updated Vital Signs BP (!) 141/101   Pulse 70   Temp 98.2 F (36.8 C)   Resp 17   Ht 1.702 m (5\' 7" )   Wt 95.3 kg   SpO2 98%   BMI 32.89 kg/m   Physical Exam Vitals signs and nursing note reviewed.  Constitutional:      Appearance: He is well-developed. He is not ill-appearing.  HENT:     Head: Normocephalic and atraumatic.     Mouth/Throat:     Mouth: Mucous membranes are moist.  Eyes:     Pupils: Pupils are equal, round, and reactive to light.  Neck:     Musculoskeletal: Neck supple.  Cardiovascular:     Rate and Rhythm: Normal rate  and regular rhythm.     Heart sounds: Normal heart sounds. No murmur.  Pulmonary:     Effort: Pulmonary effort is normal. No respiratory distress.     Breath sounds: Normal breath sounds. No wheezing.  Abdominal:     General: Bowel sounds are normal.     Palpations: Abdomen is soft.     Tenderness: There is no abdominal tenderness. There is no rebound.  Musculoskeletal:        General: No tenderness.     Right lower leg: No edema.     Left lower leg: No edema.  Lymphadenopathy:     Cervical: No cervical adenopathy.  Skin:    General: Skin is warm and dry.  Neurological:     Mental Status: He is alert and oriented to person, place, and time.  Psychiatric:        Mood and Affect: Mood normal.      ED Treatments / Results  Labs (all labs ordered are listed, but only abnormal results are displayed) Labs Reviewed  BASIC METABOLIC PANEL - Abnormal; Notable for the following components:      Result Value   Potassium 3.4 (*)    Glucose, Bld 115 (*)    All other components within normal limits  CBC WITH DIFFERENTIAL/PLATELET  D-DIMER, QUANTITATIVE (NOT AT [facility])  TROPONIN I (HIGH SENSITIVITY)  TROPONIN I (HIGH SENSITIVITY)    EKG EKG Interpretation  Date/Time:  Tuesday January 08 2019 00:52:05 EDT Ventricular Rate:  77 PR Interval:    QRS Duration: 97 QT Interval:  341 QTC Calculation: 386 R Axis:   27 Text Interpretation:  Sinus rhythm Abnormal R-wave progression, early transition Confirmed by Thayer Jew [phone]) on 01/08/2019 2:16:38 AM   Radiology Dg Chest 2 View  Result Date: 01/08/2019 CLINICAL DATA:  Palpitations and shortness of breath. EXAM: CHEST - 2 VIEW COMPARISON:  05/19/2017 FINDINGS: Chronic eventration of right hemidiaphragm.The cardiomediastinal contours are normal. The lungs are clear. Pulmonary vasculature is normal. No consolidation, pleural effusion, or pneumothorax. No acute osseous abnormalities are seen. IMPRESSION: Unremarkable radiographs of  the chest. Electronically Signed   By: Keith Rake M.D.   On: 01/08/2019 02:03    Procedures Procedures (including critical care time)  Medications Ordered in ED Medications - No data to display   Initial Impression / Assessment and Plan / ED Course  I have reviewed the triage vital signs and the nursing notes.  Pertinent labs & imaging results that were available during my care of the patient were reviewed by me and considered in my medical decision making (see chart for details).  This is a 48 year old male who presents with palpitations.  He is overall nontoxic-appearing vital signs are reassuring.  He currently he is symptom-free.  EKG shows normal sinus rhythm.  No evidence of arrhythmia.  No ischemia.  Chest x-ray is unremarkable.  Screening d-dimer is negative for PE.  Troponin x2 is negative.  Basic lab work obtained and no significant metabolic derangements.  Patient has remained asymptomatic while in the emergency department.  I have reviewed his lab work with him.  If he continues to have symptoms, he needs to follow-up with cardiology for possible Holter monitoring.  After history, exam, and medical workup I feel the patient has been appropriately medically screened and is safe for discharge home. Pertinent diagnoses were discussed with the patient. Patient was given return precautions.   Final Clinical Impressions(s) / ED Diagnoses   Final diagnoses:  Palpitations    ED Discharge Orders    None       Horton, Mayer Maskerourtney F, MD 01/08/19 0330

## 2019-01-08 NOTE — ED Triage Notes (Signed)
Palpations  And SOB x 5 hrs

## 2019-01-08 NOTE — ED Notes (Signed)
Patient transported to X-ray 

## 2019-01-08 NOTE — ED Notes (Signed)
PT states does not feel any heart fluttering since arrival.

## 2019-01-22 MED FILL — ?PRAVASTATIN NA 40 MG TAB: 40 | 30 days supply | Qty: 30 | Fill #3

## 2019-01-22 MED FILL — LOSARTAN POTASSIUM 50 MG TA: 50 | 30 days supply | Qty: 30 | Fill #3

## 2019-01-22 MED FILL — ?AMLODIPINE BESYLATE 10 MG: 10 | 30 days supply | Qty: 30 | Fill #3

## 2019-02-04 ENCOUNTER — Ambulatory Visit (INDEPENDENT_AMBULATORY_CARE_PROVIDER_SITE_OTHER): Payer: Self-pay | Admitting: Family Medicine

## 2019-02-04 ENCOUNTER — Other Ambulatory Visit: Payer: Self-pay

## 2019-02-04 ENCOUNTER — Encounter: Payer: Self-pay | Admitting: Family Medicine

## 2019-02-04 VITALS — BP 129/85 | HR 78 | Temp 98.3°F | Ht 67.0 in | Wt 212.6 lb

## 2019-02-04 DIAGNOSIS — E785 Hyperlipidemia, unspecified: Secondary | ICD-10-CM

## 2019-02-04 DIAGNOSIS — Z09 Encounter for follow-up examination after completed treatment for conditions other than malignant neoplasm: Secondary | ICD-10-CM

## 2019-02-04 DIAGNOSIS — I10 Essential (primary) hypertension: Secondary | ICD-10-CM

## 2019-02-04 DIAGNOSIS — E559 Vitamin D deficiency, unspecified: Secondary | ICD-10-CM

## 2019-02-04 DIAGNOSIS — E876 Hypokalemia: Secondary | ICD-10-CM

## 2019-02-04 MED ORDER — AMLODIPINE BESYLATE 10 MG PO TABS: 10.0000 mg | ORAL_TABLET | Freq: Every day | ORAL | 6 refills | Status: DC

## 2019-02-04 MED ORDER — LOSARTAN POTASSIUM 50 MG PO TABS: 50.0000 mg | ORAL_TABLET | Freq: Every day | ORAL | 6 refills | Status: DC

## 2019-02-04 MED ORDER — VITAMIN D (ERGOCALCIFEROL) 1.25 MG (50000 UNIT) PO CAPS: 50000.0000 [IU] | ORAL_CAPSULE | ORAL | 6 refills | Status: DC

## 2019-02-04 MED ORDER — PRAVASTATIN SODIUM 80 MG PO TABS: 80.0000 mg | ORAL_TABLET | Freq: Every day | ORAL | 6 refills | Status: DC

## 2019-02-04 MED FILL — VIT D2 1.25 MG (50,000 UNIT: 1.25 MG | 35 days supply | Qty: 5 | Fill #0

## 2019-02-04 MED FILL — PRAVASTATIN SODIUM 80 MG TA: 80 | 30 days supply | Qty: 30 | Fill #0

## 2019-02-04 MED FILL — ?AMLODIPINE BESYLATE 10 MG: 10 | 30 days supply | Qty: 30 | Fill #0

## 2019-02-04 MED FILL — LOSARTAN POTASSIUM 50 MG TA: 50 | 30 days supply | Qty: 30 | Fill #0

## 2019-02-04 NOTE — Progress Notes (Signed)
[facility]   Hospital Follow Up  Subjective:  Patient ID: Michael LopeJohnny L Fleagle, male    DOB: 03-09-1971  Age: 48 y.o. MRN: [id number]  CC:  Chief Complaint  Patient presents with  . Follow-up    6 month follow up     HPI Michael Fleming is a 48 year old male who presents for Hospital Follow Up today.   Past Medical History:  Diagnosis Date  . Heart palpitations 12/2018  . Hypertension   . Hypokalemia 12/2018  . Seizures (HCC)    last seizure age 48   Current Status: Since his last office visit, he has had an ED visit for Heart Palpitations. Today, he is doing well with no complaints. He denies visual changes, chest pain, cough, shortness of breath, heart palpitations, and falls. He has occasional headaches and dizziness with position changes. Denies severe headaches, confusion, seizures, double vision, and blurred vision, nausea and vomiting.  He denies fevers, chills, fatigue, recent infections, weight loss, and night sweats. No reports of GI problems such as diarrhea, and constipation. He has no reports of blood in stools, dysuria and hematuria. No depression or anxiety reported. He denies pain today.   Past Surgical History:  Procedure Laterality Date  . BRAIN SURGERY     1981 blot clot removed  . FOOT SURGERY     right bunion removed    Family History  Problem Relation Age of Onset  . Hypertension Father     Social History   Socioeconomic History  . Marital status: Single    Spouse name: Not on file  . Number of children: Not on file  . Years of education: Not on file  . Highest education level: Not on file  Occupational History  . Not on file  Social Needs  . Financial resource strain: Not on file  . Food insecurity    Worry: Not on file    Inability: Not on file  . Transportation needs    Medical: Not on file    Non-medical: Not on file  Tobacco Use  . Smoking status: Never Smoker  . Smokeless tobacco:  Never Used  Substance and Sexual Activity  . Alcohol use: Yes    Comment: occasional  . Drug use: No  . Sexual activity: Not on file  Lifestyle  . Physical activity    Days per week: Not on file    Minutes per session: Not on file  . Stress: Not on file  Relationships  . Social Musicianconnections    Talks on phone: Not on file    Gets together: Not on file    Attends religious service: Not on file    Active member of club or organization: Not on file    Attends meetings of clubs or organizations: Not on file    Relationship status: Not on file  . Intimate partner violence    Fear of current or ex partner: Not on file    Emotionally abused: Not on file    Physically abused: Not on file    Forced sexual activity: Not on file  Other Topics Concern  . Not on file  Social History Narrative  . Not on file    Outpatient Medications Prior to Visit  Medication Sig Dispense Refill  . lidocaine (LIDODERM) 5 % Place 1 patch onto the skin daily. Remove & Discard patch within 12 hours or as directed by MD 14 patch 0  .  amLODipine (NORVASC) 10 MG tablet Take 1 tablet (10 mg total) by mouth daily. 30 tablet 6  . losartan (COZAAR) 50 MG tablet Take 1 tablet (50 mg total) by mouth daily. 30 tablet 6  . pravastatin (PRAVACHOL) 80 MG tablet Take 1 tablet (80 mg total) by mouth daily. 90 tablet 3  . Diclofenac Sodium CR 100 MG 24 hr tablet Take 1 tablet (100 mg total) by mouth daily. (Patient not taking: Reported on 11/27/2018) 10 tablet 0  . ibuprofen (ADVIL,MOTRIN) 800 MG tablet Take 800 mg by mouth as needed.  1  . methocarbamol (ROBAXIN) 500 MG tablet Take 1 tablet (500 mg total) by mouth 2 (two) times daily. (Patient not taking: Reported on 02/04/2019) 20 tablet 0  . Vitamin D, Ergocalciferol, (DRISDOL) 1.25 MG (50000 UT) CAPS capsule Take 1 capsule (50,000 Units total) by mouth every 7 (seven) days. (Patient not taking: Reported on 02/04/2019) 5 capsule 3   No facility-administered medications prior  to visit.     No Known Allergies  ROS Review of Systems  Constitutional: Negative.   HENT: Negative.   Eyes: Negative.   Respiratory: Negative.   Cardiovascular: Positive for palpitations (occasional).  Gastrointestinal: Negative.   Endocrine: Negative.   Genitourinary: Negative.   Musculoskeletal: Negative.   Skin: Negative.   Neurological: Positive for dizziness (occasional ) and headaches (occasional).  Hematological: Negative.   Psychiatric/Behavioral: Negative.    Objective:    Physical Exam  Constitutional: He is oriented to person, place, and time. He appears well-developed and well-nourished.  HENT:  Head: Normocephalic and atraumatic.  Eyes: Conjunctivae are normal.  Neck: Normal range of motion. Neck supple.  Cardiovascular: Normal rate, regular rhythm, normal heart sounds and intact distal pulses.  Pulmonary/Chest: Effort normal and breath sounds normal.  Abdominal: Soft. Bowel sounds are normal.  Musculoskeletal: Normal range of motion.  Neurological: He is alert and oriented to person, place, and time. He has normal reflexes.  Skin: Skin is warm and dry.  Psychiatric: He has a normal mood and affect. His behavior is normal. Judgment and thought content normal.  Nursing note and vitals reviewed.   BP 129/85 (BP Location: Left Arm, Patient Position: Sitting, Cuff Size: Normal)   Pulse 78   Temp 98.3 F (36.8 C) (Oral)   Ht 5\' 7"  (1.702 m)   Wt 212 lb 9.6 oz (96.4 kg)   SpO2 100%   BMI 33.30 kg/m  Wt Readings from Last 3 Encounters:  02/04/19 212 lb 9.6 oz (96.4 kg)  01/08/19 210 lb (95.3 kg)  11/27/18 214 lb (97.1 kg)     Health Maintenance Due  Topic Date Due  . HIV Screening  09/03/1983  . COLONOSCOPY  09/03/2018    There are no preventive care reminders to display for this patient.  Lab Results  Component Value Date   TSH 0.54 09/29/2016   Lab Results  Component Value Date   WBC 6.1 01/08/2019   HGB 13.3 01/08/2019   HCT 39.2  01/08/2019   MCV 83.8 01/08/2019   PLT 313 01/08/2019   Lab Results  Component Value Date   NA 139 01/08/2019   K 4.1 02/04/2019   CO2 26 01/08/2019   GLUCOSE 115 (H) 01/08/2019   BUN 10 01/08/2019   CREATININE 1.13 01/08/2019   BILITOT 0.5 09/29/2016   ALKPHOS 61 09/29/2016   AST 21 09/29/2016   ALT 21 09/29/2016   PROT 7.0 09/29/2016   ALBUMIN 4.2 09/29/2016   CALCIUM 9.3 01/08/2019  ANIONGAP 10 01/08/2019   Lab Results  Component Value Date   CHOL 224 (H) 08/06/2018   Lab Results  Component Value Date   HDL 39 (L) 08/06/2018   Lab Results  Component Value Date   LDLCALC 165 (H) 08/06/2018   Lab Results  Component Value Date   TRIG 101 08/06/2018   Lab Results  Component Value Date   CHOLHDL 5.7 (H) 08/06/2018   Lab Results  Component Value Date   HGBA1C 5.6 08/06/2018      Assessment & Plan:   1. Essential hypertension The current medical regimen is effective; blood pressure is stable at 129/85 today; continue present plan and medications as prescribed. He will continue to decrease high sodium intake, excessive alcohol intake, increase potassium intake, smoking cessation, and increase physical activity of at least 30 minutes of cardio activity daily. He will continue to follow Heart Healthy or DASH diet.  - amLODipine (NORVASC) 10 MG tablet; Take 1 tablet (10 mg total) by mouth daily.  Dispense: 30 tablet; Refill: 6 - losartan (COZAAR) 50 MG tablet; Take 1 tablet (50 mg total) by mouth daily.  Dispense: 30 tablet; Refill: 6  2. Hyperlipidemia, unspecified hyperlipidemia type - pravastatin (PRAVACHOL) 80 MG tablet; Take 1 tablet (80 mg total) by mouth daily.  Dispense: 30 tablet; Refill: 6  3. Hypokalemia We will re-evaluate potassium levels today.  - Potassium  4. Vitamin D deficiency - Vitamin D, Ergocalciferol, (DRISDOL) 1.25 MG (50000 UT) CAPS capsule; Take 1 capsule (50,000 Units total) by mouth every 7 (seven) days.  Dispense: 5 capsule; Refill:  6  5. Follow up Keep previously scheduled follow up appointment.   Meds ordered this encounter  Medications  . pravastatin (PRAVACHOL) 80 MG tablet    Sig: Take 1 tablet (80 mg total) by mouth daily.    Dispense:  30 tablet    Refill:  6  . Vitamin D, Ergocalciferol, (DRISDOL) 1.25 MG (50000 UT) CAPS capsule    Sig: Take 1 capsule (50,000 Units total) by mouth every 7 (seven) days.    Dispense:  5 capsule    Refill:  6  . amLODipine (NORVASC) 10 MG tablet    Sig: Take 1 tablet (10 mg total) by mouth daily.    Dispense:  30 tablet    Refill:  6  . losartan (COZAAR) 50 MG tablet    Sig: Take 1 tablet (50 mg total) by mouth daily.    Dispense:  30 tablet    Refill:  6    Orders Placed This Encounter  Procedures  . Potassium    Referral Orders  No referral(s) requested today    Raliegh Ip,  MSN, FNP-BC [facility] [address] [place], [place] [address] [phone]  Problem List Items Addressed This Visit      Cardiovascular and Mediastinum   HTN (hypertension) - Primary   Relevant Medications   pravastatin (PRAVACHOL) 80 MG tablet   amLODipine (NORVASC) 10 MG tablet   losartan (COZAAR) 50 MG tablet    Other Visit Diagnoses    Hyperlipidemia, unspecified hyperlipidemia type       Relevant Medications   pravastatin (PRAVACHOL) 80 MG tablet   amLODipine (NORVASC) 10 MG tablet   losartan (COZAAR) 50 MG tablet   Hypokalemia       Relevant Orders   Potassium (Completed)   Vitamin D deficiency       Relevant Medications  Vitamin D, Ergocalciferol, (DRISDOL) 1.25 MG (50000 UT) CAPS capsule   Follow up          Meds ordered this encounter  Medications  . pravastatin (PRAVACHOL) 80 MG tablet    Sig: Take 1 tablet (80 mg total) by mouth daily.    Dispense:  30 tablet    Refill:  6  . Vitamin D, Ergocalciferol, (DRISDOL) 1.25 MG (50000 UT) CAPS capsule    Sig: Take 1  capsule (50,000 Units total) by mouth every 7 (seven) days.    Dispense:  5 capsule    Refill:  6  . amLODipine (NORVASC) 10 MG tablet    Sig: Take 1 tablet (10 mg total) by mouth daily.    Dispense:  30 tablet    Refill:  6  . losartan (COZAAR) 50 MG tablet    Sig: Take 1 tablet (50 mg total) by mouth daily.    Dispense:  30 tablet    Refill:  6    Follow-up: No follow-ups on file.    Kallie LocksNatalie M Alverda Nazzaro, FNP

## 2019-02-05 DIAGNOSIS — E559 Vitamin D deficiency, unspecified: Secondary | ICD-10-CM | POA: Insufficient documentation

## 2019-02-05 DIAGNOSIS — E876 Hypokalemia: Secondary | ICD-10-CM | POA: Insufficient documentation

## 2019-02-05 DIAGNOSIS — E785 Hyperlipidemia, unspecified: Secondary | ICD-10-CM | POA: Insufficient documentation

## 2019-02-05 LAB — POTASSIUM: Potassium: 4.1 mmol/L (ref 3.5–5.2)

## 2019-02-08 ENCOUNTER — Telehealth: Payer: Self-pay

## 2019-02-08 NOTE — Telephone Encounter (Signed)
Called, no answer. Left a message that potassium was normal and if any questions to call back to our office. Thanks!

## 2019-02-08 NOTE — Telephone Encounter (Signed)
-----   Message from Mardelle Matte, [place] sent at 02/08/2019  9:18 AM EDT ----- Michael Fleming to laura

## 2019-04-23 ENCOUNTER — Encounter (HOSPITAL_COMMUNITY): Payer: Self-pay

## 2019-04-23 ENCOUNTER — Other Ambulatory Visit: Payer: Self-pay

## 2019-04-23 ENCOUNTER — Ambulatory Visit (HOSPITAL_COMMUNITY)
Admission: EM | Admit: 2019-04-23 | Discharge: 2019-04-23 | Disposition: A | Discharge status: Home / Self Care | Payer: Self-pay | Attending: Family Medicine | Admitting: Family Medicine

## 2019-04-23 DIAGNOSIS — R0981 Nasal congestion: Secondary | ICD-10-CM

## 2019-04-23 DIAGNOSIS — R0789 Other chest pain: Secondary | ICD-10-CM

## 2019-04-23 DIAGNOSIS — R0989 Other specified symptoms and signs involving the circulatory and respiratory systems: Secondary | ICD-10-CM

## 2019-04-23 DIAGNOSIS — M791 Myalgia, unspecified site: Secondary | ICD-10-CM

## 2019-04-23 DIAGNOSIS — U071 COVID-19: Secondary | ICD-10-CM

## 2019-04-23 MED ORDER — PROMETHAZINE-DM 6.25-15 MG/5ML PO SYRP: 5.0000 mL | ORAL_SOLUTION | Freq: Three times a day (TID) | ORAL | 0 refills | Status: DC | PRN

## 2019-04-23 MED ORDER — BENZONATATE 100 MG PO CAPS: 100.0000 mg | ORAL_CAPSULE | Freq: Three times a day (TID) | ORAL | 0 refills | Status: DC | PRN

## 2019-04-23 NOTE — ED Triage Notes (Signed)
Pt presents to UC w/ c/o chills, runny nose, chest congestion, coughing, muscle aches x1 week.

## 2019-04-23 NOTE — Discharge Instructions (Addendum)
For sore throat or cough try using a honey-based tea. Use 3 teaspoons of honey with juice squeezed from half lemon. Place shaved pieces of ginger into 1/2-1 cup of water and warm over stove top. Then mix the ingredients and repeat every 4 hours as needed. Please take Tylenol 500mg every 6 hours. Hydrate very well with at least 2 liters of water. Eat light meals such as soups to replenish electrolytes and soft fruits, veggies. Start an antihistamine like Zyrtec, Allegra or Claritin for postnasal drainage, sinus congestion.  You can take this together with pseudoephedrine (Sudafed) at a dose of 60 mg 3 times a day or twice daily as needed for the same kind of congestion.  °

## 2019-04-23 NOTE — ED Provider Notes (Signed)
[facility]   MRN: [id number] DOB: 1971/02/26  Subjective:   Michael Fleming is a 48 y.o. male presenting for 1 week history of persistent body aches, coughing with chest congestion, chills, runny nose.  Cough has elicited chest pain over mid to upper part.  Has tried Tylenol, Tylenol Cold flu.  Denies history of asthma, COPD, denies smoking history.  No current facility-administered medications for this encounter.   Current Outpatient Medications:  .  amLODipine (NORVASC) 10 MG tablet, Take 1 tablet (10 mg total) by mouth daily., Disp: 30 tablet, Rfl: 6 .  Diclofenac Sodium CR 100 MG 24 hr tablet, Take 1 tablet (100 mg total) by mouth daily. (Patient not taking: Reported on 11/27/2018), Disp: 10 tablet, Rfl: 0 .  ibuprofen (ADVIL,MOTRIN) 800 MG tablet, Take 800 mg by mouth as needed., Disp: , Rfl: 1 .  lidocaine (LIDODERM) 5 %, Place 1 patch onto the skin daily. Remove & Discard patch within 12 hours or as directed by MD, Disp: 14 patch, Rfl: 0 .  losartan (COZAAR) 50 MG tablet, Take 1 tablet (50 mg total) by mouth daily., Disp: 30 tablet, Rfl: 6 .  methocarbamol (ROBAXIN) 500 MG tablet, Take 1 tablet (500 mg total) by mouth 2 (two) times daily. (Patient not taking: Reported on 02/04/2019), Disp: 20 tablet, Rfl: 0 .  pravastatin (PRAVACHOL) 80 MG tablet, Take 1 tablet (80 mg total) by mouth daily., Disp: 30 tablet, Rfl: 6 .  Vitamin D, Ergocalciferol, (DRISDOL) 1.25 MG (50000 UT) CAPS capsule, Take 1 capsule (50,000 Units total) by mouth every 7 (seven) days., Disp: 5 capsule, Rfl: 6   No Known Allergies  Past Medical History:  Diagnosis Date  . Heart palpitations 12/2018  . Hypertension   . Hypokalemia 12/2018  . Seizures ([place])    last seizure age 89     Past Surgical History:  Procedure Laterality Date  . BRAIN SURGERY     1981 blot clot removed  . FOOT SURGERY     right bunion removed    Family History  Problem Relation Age of Onset  . Healthy Mother   .  Hypertension Father     Social History   Tobacco Use  . Smoking status: Never Smoker  . Smokeless tobacco: Never Used  Substance Use Topics  . Alcohol use: Yes    Comment: occasional  . Drug use: No    Review of Systems  Constitutional: Positive for chills and malaise/fatigue. Negative for fever.  HENT: Positive for congestion and sore throat. Negative for ear pain and sinus pain.   Eyes: Negative for discharge and redness.  Respiratory: Positive for cough. Negative for hemoptysis, shortness of breath and wheezing.   Cardiovascular: Positive for chest pain.  Gastrointestinal: Negative for abdominal pain, diarrhea, nausea and vomiting.  Genitourinary: Negative for dysuria, flank pain and hematuria.  Musculoskeletal: Positive for myalgias.  Skin: Negative for rash.  Neurological: Negative for dizziness, weakness and headaches.  Psychiatric/Behavioral: Negative for depression and substance abuse.     Objective:   Vitals: BP 123/79 (BP Location: Left Arm)   Pulse 88   Temp 99.6 F (37.6 C) (Oral)   Resp 16   SpO2 97%   Physical Exam Constitutional:      General: He is not in acute distress.    Appearance: Normal appearance. He is well-developed and normal weight. He is not ill-appearing, toxic-appearing or diaphoretic.  HENT:     Head: Normocephalic and atraumatic.     Right Ear:  External ear normal.     Left Ear: External ear normal.     Nose: Nose normal.     Mouth/Throat:     Mouth: Mucous membranes are moist.     Pharynx: Oropharynx is clear.  Eyes:     General: No scleral icterus.       Right eye: No discharge.        Left eye: No discharge.     Extraocular Movements: Extraocular movements intact.     Pupils: Pupils are equal, round, and reactive to light.  Neck:     Musculoskeletal: Normal range of motion.  Cardiovascular:     Rate and Rhythm: Normal rate and regular rhythm.     Heart sounds: Normal heart sounds. No murmur. No friction rub. No gallop.    Pulmonary:     Effort: Pulmonary effort is normal. No respiratory distress.     Breath sounds: Normal breath sounds. No stridor. No wheezing, rhonchi or rales.  Neurological:     Mental Status: He is alert and oriented to person, place, and time.  Psychiatric:        Mood and Affect: Mood normal.        Behavior: Behavior normal.        Thought Content: Thought content normal.        Judgment: Judgment normal.     Positive point-of-care COVID-19 test by verbal report.   Assessment and Plan :   1. COVID-19 virus infection   2. Chest congestion   3. Atypical chest pain     Will manage for supportive for COVID-19. Counseled patient on nature of COVID-19 including modes of transmission, diagnostic testing, management and supportive care.  Offered symptomatic relief. Counseled patient on potential for adverse effects with medications prescribed/recommended today, ER and return-to-clinic precautions discussed, patient verbalized understanding.     Wallis Bamberg, PA-C 04/23/19 1737

## 2019-04-24 MED FILL — BENZONATATE 100 MG CAPS: 100 | 10 days supply | Qty: 60 | Fill #0

## 2019-04-24 MED FILL — PROMETHAZINE W/DM SYRUP: 6.25-15 | 6 days supply | Qty: 100 | Fill #0

## 2019-05-31 ENCOUNTER — Ambulatory Visit (HOSPITAL_COMMUNITY)
Admission: EM | Admit: 2019-05-31 | Discharge: 2019-05-31 | Disposition: A | Discharge status: Home / Self Care | Payer: Self-pay | Attending: Family Medicine | Admitting: Family Medicine

## 2019-05-31 ENCOUNTER — Encounter (HOSPITAL_COMMUNITY): Payer: Self-pay

## 2019-05-31 ENCOUNTER — Other Ambulatory Visit: Payer: Self-pay

## 2019-05-31 DIAGNOSIS — Z8616 Personal history of COVID-19: Secondary | ICD-10-CM

## 2019-05-31 DIAGNOSIS — Z09 Encounter for follow-up examination after completed treatment for conditions other than malignant neoplasm: Secondary | ICD-10-CM

## 2019-05-31 DIAGNOSIS — I10 Essential (primary) hypertension: Secondary | ICD-10-CM

## 2019-05-31 DIAGNOSIS — R519 Headache, unspecified: Secondary | ICD-10-CM

## 2019-05-31 DIAGNOSIS — Z0489 Encounter for examination and observation for other specified reasons: Secondary | ICD-10-CM

## 2019-05-31 DIAGNOSIS — Z7689 Persons encountering health services in other specified circumstances: Secondary | ICD-10-CM

## 2019-05-31 MED FILL — PRAVASTATIN SODIUM 80 MG TA: 80 | 30 days supply | Qty: 30 | Fill #1

## 2019-05-31 MED FILL — VIT D2 1.25 MG (50,000 UNIT: 1.25 MG | 35 days supply | Qty: 5 | Fill #1

## 2019-05-31 MED FILL — ?AMLODIPINE BESYLATE 10 MG: 10 | 30 days supply | Qty: 30 | Fill #1

## 2019-05-31 MED FILL — LOSARTAN POTASSIUM 50 MG TA: 50 | 30 days supply | Qty: 30 | Fill #1

## 2019-05-31 NOTE — ED Provider Notes (Signed)
[facility]    CSN: [id number] Arrival date & time: 05/31/19  1430      History   Chief Complaint Chief Complaint  Patient presents with  . Work Note    HPI Michael Fleming is a 49 y.o. male.   Michael Fleming presents with complaints requests for note to return to work. He was diagnosed by rapid testing with covid-19 here at [facility] on 04/23/2019. His symptoms have resolved. No further URI symptoms. He has occasional headaches. He hasn't been taking his blood pressure medication, as he states he was concerned about mixing over the counter medications he was taking for covid, with his bp medication. No fevers. No leg swelling. No chest pain . No shortness of breath . 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     ROS per HPI, negative if not otherwise mentioned.      Past Medical History:  Diagnosis Date  . Heart palpitations 12/2018  . Hypertension   . Hypokalemia 12/2018  . Seizures (HCC)    last seizure age 17    Patient Active Problem List   Diagnosis Date Noted  . Hyperlipidemia 02/05/2019  . Hypokalemia 02/05/2019  . Vitamin D deficiency 02/05/2019  . Tick bite of groin 11/27/2018  . Back pain 08/06/2018  . Blurry vision 08/06/2018  . Pain in right wrist 03/27/2017  . Chronic pain of left knee 03/27/2017  . HTN (hypertension) 10/23/2016  . History of brain surgery 10/23/2016  . Chronic headache 10/23/2016    Past Surgical History:  Procedure Laterality Date  . BRAIN SURGERY     1981 blot clot removed  . FOOT SURGERY     right bunion removed       Home Medications    Prior to Admission medications   Medication Sig Start Date End Date Taking? Authorizing Provider  amLODipine (NORVASC) 10 MG tablet Take 1 tablet (10 mg total) by mouth daily. 02/04/19   Kallie Locks, FNP  benzonatate (TESSALON) 100 MG capsule Take 1-2 capsules (100-200 mg total) by mouth 3 (three) times daily as needed. 04/23/19   Wallis Bamberg, PA-C    Diclofenac Sodium CR 100 MG 24 hr tablet Take 1 tablet (100 mg total) by mouth daily. Patient not taking: Reported on 11/27/2018 10/14/18   Palumbo, April, MD  ibuprofen (ADVIL,MOTRIN) 800 MG tablet Take 800 mg by mouth as needed. 05/05/17   [provider]  lidocaine (LIDODERM) 5 % Place 1 patch onto the skin daily. Remove & Discard patch within 12 hours or as directed by MD 10/14/18   Nicanor Alcon, April, MD  losartan (COZAAR) 50 MG tablet Take 1 tablet (50 mg total) by mouth daily. 02/04/19   Kallie Locks, FNP  methocarbamol (ROBAXIN) 500 MG tablet Take 1 tablet (500 mg total) by mouth 2 (two) times daily. Patient not taking: Reported on 02/04/2019 10/14/18   Palumbo, April, MD  pravastatin (PRAVACHOL) 80 MG tablet Take 1 tablet (80 mg total) by mouth daily. 02/04/19 09/02/19  Kallie Locks, FNP  promethazine-dextromethorphan (PROMETHAZINE-DM) 6.25-15 MG/5ML syrup Take 5 mLs by mouth 3 (three) times daily as needed for cough. 04/23/19   Wallis Bamberg, PA-C  Vitamin D, Ergocalciferol, (DRISDOL) 1.25 MG (50000 UT) CAPS capsule Take 1 capsule (50,000 Units total) by mouth every 7 (seven) days. 02/04/19   Kallie Locks, FNP    Family History Family History  Problem Relation Age of Onset  . Healthy Mother   .  Hypertension Father     Social History Social History   Tobacco Use  . Smoking status: Never Smoker  . Smokeless tobacco: Never Used  Substance Use Topics  . Alcohol use: Yes    Comment: occasional  . Drug use: No     Allergies   Patient has no known allergies.   Review of Systems Review of Systems   Physical Exam Triage Vital Signs ED Triage Vitals  Enc Vitals Group     BP 05/31/19 1543 (!) 142/103     Pulse Rate 05/31/19 1543 78     Resp 05/31/19 1543 18     Temp 05/31/19 1543 98.6 F (37 C)     Temp Source 05/31/19 1543 Oral     SpO2 05/31/19 1543 98 %     Weight 05/31/19 1539 218 lb (98.9 kg)     Height --      Head Circumference --      Peak Flow --       Pain Score 05/31/19 1539 0     Pain Loc --      Pain Edu? --      Excl. in [place]? --    No data found.  Updated Vital Signs BP (!) 142/103 (BP Location: Left Arm)   Pulse 78   Temp 98.6 F (37 C) (Oral)   Resp 18   Wt 218 lb (98.9 kg)   SpO2 98%   BMI 34.14 kg/m   Visual Acuity Right Eye Distance:   Left Eye Distance:   Bilateral Distance:    Right Eye Near:   Left Eye Near:    Bilateral Near:     Physical Exam Constitutional:      Appearance: He is well-developed.  Cardiovascular:     Rate and Rhythm: Normal rate.  Pulmonary:     Effort: Pulmonary effort is normal.  Skin:    General: Skin is warm and dry.  Neurological:     Mental Status: He is alert and oriented to person, place, and time.      UC Treatments / Results  Labs (all labs ordered are listed, but only abnormal results are displayed) Labs Reviewed - No data to display  EKG   Radiology No results found.  Procedures Procedures (including critical care time)  Medications Ordered in UC Medications - No data to display  Initial Impression / Assessment and Plan / UC Course  I have reviewed the triage vital signs and the nursing notes.  Pertinent labs & imaging results that were available during my care of the patient were reviewed by me and considered in my medical decision making (see chart for details).     covidf-19 dignosed on 04/23/2019 with improvements of symptoms. No indication for retesting. Hypertension noted, hasn't been taking his medication. Encouraged to restart this, as may be source of headache. Encouraged follow up with PCP for recheck. Per cdc guidelines on covid, ok to return to work with note provided. Patient verbalized understanding and agreeable to plan.   Final Clinical Impressions(s) / UC Diagnoses   Final diagnoses:  Return to work evaluation  Hypertension, unspecified type     Discharge Instructions     Per the CDC guidelines on returning to work:   "I  think or know I had COVID-19, and I had symptoms You can be around others after:  *10 days since symptoms first appeared and  *24 hours with no fever without the use of fever-reducing medications and *Other symptoms of  COVID-19 are improving* *Loss of taste and smell may persist for weeks or months after recovery and need not delay the end of isolation  Most people do not require testing to decide when they can be around others; however, if your healthcare provider recommends testing, they will let you know when you can resume being around others based on your test results.  Note that these recommendations do not apply to persons with severe COVID-19 or with severely weakened immune systems (immunocompromised). These persons should follow the guidance below for "I was severely ill with COVID-19 or have a severely weakened immune system (immunocompromised) due to a health condition or medication. When can I be around others?"    Please take your blood pressure medication as this likely will help with your headaches. Please follow up with your primary care provider for BP recheck.     ED Prescriptions    None     PDMP not reviewed this encounter.   Georgetta Haber, NP 05/31/19 [phone]

## 2019-05-31 NOTE — Discharge Instructions (Signed)
Per the CDC guidelines on returning to work:   "I think or know I had COVID-19, and I had symptoms You can be around others after:  *10 days since symptoms first appeared and  *24 hours with no fever without the use of fever-reducing medications and *Other symptoms of COVID-19 are improving* *Loss of taste and smell may persist for weeks or months after recovery and need not delay the end of isolation  Most people do not require testing to decide when they can be around others; however, if your healthcare provider recommends testing, they will let you know when you can resume being around others based on your test results.  Note that these recommendations do not apply to persons with severe COVID-19 or with severely weakened immune systems (immunocompromised). These persons should follow the guidance below for "I was severely ill with COVID-19 or have a severely weakened immune system (immunocompromised) due to a health condition or medication. When can I be around others?"    Please take your blood pressure medication as this likely will help with your headaches. Please follow up with your primary care provider for BP recheck.

## 2019-05-31 NOTE — ED Triage Notes (Addendum)
Pt. States he is here for a work note stating he can go back to work, but he wants the work note to say he can go back 06/05/2019, instead of today. Pt. Tested POSITIVE on 04/29/2019 for COVID, needing clearance to go back to work. States he is having headaches but denies ANY other symptoms.

## 2019-08-02 ENCOUNTER — Telehealth: Payer: Self-pay | Admitting: Family Medicine

## 2019-08-02 NOTE — Telephone Encounter (Signed)
Pt was called and reminded of there appointment 

## 2019-08-05 ENCOUNTER — Encounter: Payer: Self-pay | Admitting: Family Medicine

## 2019-08-05 ENCOUNTER — Other Ambulatory Visit: Payer: Self-pay

## 2019-08-05 ENCOUNTER — Other Ambulatory Visit: Payer: Self-pay | Admitting: Family Medicine

## 2019-08-05 ENCOUNTER — Ambulatory Visit (INDEPENDENT_AMBULATORY_CARE_PROVIDER_SITE_OTHER): Payer: Self-pay | Admitting: Family Medicine

## 2019-08-05 VITALS — BP 125/66 | HR 75 | Temp 98.6°F | Ht 67.0 in | Wt 217.4 lb

## 2019-08-05 DIAGNOSIS — Z1211 Encounter for screening for malignant neoplasm of colon: Secondary | ICD-10-CM

## 2019-08-05 DIAGNOSIS — Z09 Encounter for follow-up examination after completed treatment for conditions other than malignant neoplasm: Secondary | ICD-10-CM

## 2019-08-05 DIAGNOSIS — E559 Vitamin D deficiency, unspecified: Secondary | ICD-10-CM

## 2019-08-05 DIAGNOSIS — I10 Essential (primary) hypertension: Secondary | ICD-10-CM

## 2019-08-05 DIAGNOSIS — Z0000 Encounter for general adult medical examination without abnormal findings: Secondary | ICD-10-CM

## 2019-08-05 DIAGNOSIS — M549 Dorsalgia, unspecified: Secondary | ICD-10-CM

## 2019-08-05 LAB — POCT URINALYSIS DIPSTICK
Bilirubin, UA: NEGATIVE
Blood, UA: NEGATIVE
Glucose, UA: NEGATIVE
Ketones, UA: NEGATIVE
Leukocytes, UA: NEGATIVE
Nitrite, UA: NEGATIVE
Protein, UA: POSITIVE — AB
Spec Grav, UA: >=1.03 — AB (ref 1.010–1.025)
Urobilinogen, UA: 1 E.U./dL
pH, UA: 5.5 (ref 5.0–8.0)

## 2019-08-05 LAB — POCT GLYCOSYLATED HEMOGLOBIN (HGB A1C): Hemoglobin A1C: 5.5 % (ref 4.0–5.6)

## 2019-08-05 LAB — GLUCOSE, POCT (MANUAL RESULT ENTRY): POC Glucose: 134 mg/dl — AB (ref 70–99)

## 2019-08-05 MED ORDER — METHOCARBAMOL 500 MG PO TABS
500.0000 mg | ORAL_TABLET | Freq: Two times a day (BID) | ORAL | 3 refills | Status: DC
Start: 2019-08-05 — End: 2020-08-05

## 2019-08-05 MED ORDER — IBUPROFEN 800 MG PO TABS: 800.0000 mg | ORAL_TABLET | Freq: Three times a day (TID) | ORAL | 3 refills | Status: DC | PRN

## 2019-08-05 MED FILL — IBUPROFEN 800 MG TABLET: 800 | 10 days supply | Qty: 30 | Fill #0

## 2019-08-05 MED FILL — METHOCARBAMOL 500 MG TABS: 500 | 15 days supply | Qty: 30 | Fill #0

## 2019-08-05 NOTE — Progress Notes (Signed)
[facility]    Established Patient Office Visit  Subjective:  Patient ID: Michael Fleming, male    DOB: 02/06/1971  Age: 49 y.o. MRN: [id number]  CC:  Chief Complaint  Patient presents with  . Follow-up    HTN  . Back Pain    Lower back pain    HPI Michael Fleming is a 49 year old male who presents for Follow Up today.   Past Medical History:  Diagnosis Date  . Heart palpitations 12/2018  . Hypertension   . Hypokalemia 12/2018  . Seizures (HCC)    last seizure age 36   Current Status: Since his last office visit, he has c/o chronic back pain. He denies visual changes, chest pain, cough, shortness of breath, heart palpitations, and falls. He has occasional headaches and dizziness with position changes. Denies severe headaches, confusion, seizures, double vision, and blurred vision, nausea and vomiting. He denies fevers, chills, fatigue, recent infections, weight loss, and night sweats. No reports of GI problems such as diarrhea, and constipation. He has no reports of blood in stools, dysuria and hematuria. No depression or anxiety reported today. He denies suicidal ideations, homicidal ideations, or auditory hallucinations. He has generalized pain today.   Past Surgical History:  Procedure Laterality Date  . BRAIN SURGERY     1981 blot clot removed  . FOOT SURGERY     right bunion removed    Family History  Problem Relation Age of Onset  . Healthy Mother   . Hypertension Father     Social History   Socioeconomic History  . Marital status: Single    Spouse name: Not on file  . Number of children: Not on file  . Years of education: Not on file  . Highest education level: Not on file  Occupational History  . Not on file  Tobacco Use  . Smoking status: Never Smoker  . Smokeless tobacco: Never Used  Substance and Sexual Activity  . Alcohol use: Yes    Comment: occasional  . Drug use: No  . Sexual activity: Not  Currently  Other Topics Concern  . Not on file  Social History Narrative  . Not on file   Social Determinants of Health   Financial Resource Strain:   . Difficulty of Paying Living Expenses:   Food Insecurity:   . Worried About Programme researcher, broadcasting/film/video in the Last Year:   . Barista in the Last Year:   Transportation Needs:   . Freight forwarder (Medical):   Marland Kitchen Lack of Transportation (Non-Medical):   Physical Activity:   . Days of Exercise per Week:   . Minutes of Exercise per Session:   Stress:   . Feeling of Stress :   Social Connections:   . Frequency of Communication with Friends and Family:   . Frequency of Social Gatherings with Friends and Family:   . Attends Religious Services:   . Active Member of Clubs or Organizations:   . Attends Banker Meetings:   Marland Kitchen Marital Status:   Intimate Partner Violence:   . Fear of Current or Ex-Partner:   . Emotionally Abused:   Marland Kitchen Physically Abused:   . Sexually Abused:     Outpatient Medications Prior to Visit  Medication Sig Dispense Refill  . amLODipine (NORVASC) 10 MG tablet Take 1 tablet (10 mg total) by mouth daily. 30 tablet 6  . lidocaine (LIDODERM) 5 % Place  1 patch onto the skin daily. Remove & Discard patch within 12 hours or as directed by MD 14 patch 0  . losartan (COZAAR) 50 MG tablet Take 1 tablet (50 mg total) by mouth daily. 30 tablet 6  . pravastatin (PRAVACHOL) 80 MG tablet Take 1 tablet (80 mg total) by mouth daily. 30 tablet 6  . Vitamin D, Ergocalciferol, (DRISDOL) 1.25 MG (50000 UT) CAPS capsule Take 1 capsule (50,000 Units total) by mouth every 7 (seven) days. 5 capsule 6  . benzonatate (TESSALON) 100 MG capsule Take 1-2 capsules (100-200 mg total) by mouth 3 (three) times daily as needed. 60 capsule 0  . Diclofenac Sodium CR 100 MG 24 hr tablet Take 1 tablet (100 mg total) by mouth daily. (Patient not taking: Reported on 08/05/2019) 10 tablet 0  . ibuprofen (ADVIL,MOTRIN) 800 MG tablet Take  800 mg by mouth as needed.  1  . methocarbamol (ROBAXIN) 500 MG tablet Take 1 tablet (500 mg total) by mouth 2 (two) times daily. (Patient not taking: Reported on 02/04/2019) 20 tablet 0  . promethazine-dextromethorphan (PROMETHAZINE-DM) 6.25-15 MG/5ML syrup Take 5 mLs by mouth 3 (three) times daily as needed for cough. 100 mL 0   No facility-administered medications prior to visit.    No Known Allergies  ROS Review of Systems  Constitutional: Negative.   HENT: Negative.   Eyes: Negative.   Respiratory: Negative.   Cardiovascular: Negative.   Gastrointestinal: Positive for abdominal distention.  Endocrine: Negative.   Genitourinary: Negative.   Musculoskeletal: Positive for arthralgias (generalized joint pain ).  Skin: Negative.   Allergic/Immunologic: Negative.   Neurological: Positive for dizziness (occasional ) and headaches (occasional ).  Hematological: Negative.   Psychiatric/Behavioral: Negative.       Objective:    Physical Exam  Constitutional: He is oriented to person, place, and time. He appears well-developed and well-nourished.  HENT:  Head: Normocephalic and atraumatic.  Eyes: Conjunctivae are normal.  Cardiovascular: Normal rate, regular rhythm, normal heart sounds and intact distal pulses.  Pulmonary/Chest: Effort normal and breath sounds normal.  Abdominal: Soft. Bowel sounds are normal. He exhibits distension (obese).  Musculoskeletal:        General: Normal range of motion.     Cervical back: Normal range of motion and neck supple.  Neurological: He is alert and oriented to person, place, and time. He has normal reflexes.  Skin: Skin is warm and dry.  Psychiatric: He has a normal mood and affect. His behavior is normal. Judgment and thought content normal.  Nursing note and vitals reviewed.   BP 125/66   Pulse 75   Temp 98.6 F (37 C) (Oral)   Ht 5\' 7"  (1.702 m)   Wt 217 lb 6.4 oz (98.6 kg)   BMI 34.05 kg/m  Wt Readings from Last 3 Encounters:    08/05/19 217 lb 6.4 oz (98.6 kg)  05/31/19 218 lb (98.9 kg)  02/04/19 212 lb 9.6 oz (96.4 kg)     Health Maintenance Due  Topic Date Due  . HIV Screening  Never done  . COLONOSCOPY  Never done    There are no preventive care reminders to display for this patient.  Lab Results  Component Value Date   TSH 0.687 08/05/2019   Lab Results  Component Value Date   WBC 4.7 08/05/2019   HGB 13.6 08/05/2019   HCT 40.8 08/05/2019   MCV 87 08/05/2019   PLT 295 08/05/2019   Lab Results  Component Value Date  NA 140 08/05/2019   K 3.8 08/05/2019   CO2 25 08/05/2019   GLUCOSE 103 (H) 08/05/2019   BUN 17 08/05/2019   CREATININE 1.19 08/05/2019   BILITOT 0.4 08/05/2019   ALKPHOS 85 08/05/2019   AST 33 08/05/2019   ALT 31 08/05/2019   PROT 7.1 08/05/2019   ALBUMIN 4.6 08/05/2019   CALCIUM 9.6 08/05/2019   ANIONGAP 10 01/08/2019   Lab Results  Component Value Date   CHOL 181 08/05/2019   Lab Results  Component Value Date   HDL 35 (L) 08/05/2019   Lab Results  Component Value Date   LDLCALC 125 (H) 08/05/2019   Lab Results  Component Value Date   TRIG 113 08/05/2019   Lab Results  Component Value Date   CHOLHDL 5.2 (H) 08/05/2019   Lab Results  Component Value Date   HGBA1C 5.5 08/05/2019      Assessment & Plan:   1. Essential hypertension The current medical regimen is effective; blood pressure is stable at 125/66 today; continue present plan and medications as prescribed. He will continue to take medications as prescribed, to decrease high sodium intake, excessive alcohol intake, increase potassium intake, smoking cessation, and increase physical activity of at least 30 minutes of cardio activity daily. He will continue to follow Heart Healthy or DASH diet.  2. Back pain, unspecified back location, unspecified back pain laterality, unspecified chronicit - ibuprofen (ADVIL) 800 MG tablet; Take 1 tablet (800 mg total) by mouth every 8 (eight) hours as needed.   Dispense: 30 tablet; Refill: 3 - methocarbamol (ROBAXIN) 500 MG tablet; Take 1 tablet (500 mg total) by mouth 2 (two) times daily.  Dispense: 30 tablet; Refill: 3  3. Screening for colon cancer Benefits of colorectal screening at age 74 discussed in detail today. We will re-visit at next office visit.   4. Vitamin D deficiency  5. Health care maintenance - POCT urinalysis dipstick - POCT glycosylated hemoglobin (Hb A1C) - POCT glucose (manual entry) - CBC with Differential - Comprehensive metabolic panel - Lipid Panel - TSH - Vitamin B12 - Vitamin D, 25-hydroxy  6. Follow up He will follow up in 6 months.   Meds ordered this encounter  Medications  . ibuprofen (ADVIL) 800 MG tablet    Sig: Take 1 tablet (800 mg total) by mouth every 8 (eight) hours as needed.    Dispense:  30 tablet    Refill:  3  . methocarbamol (ROBAXIN) 500 MG tablet    Sig: Take 1 tablet (500 mg total) by mouth 2 (two) times daily.    Dispense:  30 tablet    Refill:  3    Orders Placed This Encounter  Procedures  . CBC with Differential  . Comprehensive metabolic panel  . Lipid Panel  . TSH  . Vitamin B12  . Vitamin D, 25-hydroxy  . POCT urinalysis dipstick  . POCT glycosylated hemoglobin (Hb A1C)  . POCT glucose (manual entry)    Referral Orders  No referral(s) requested today    Raliegh Ip,  MSN, FNP-BC [facility] [address] [place], [place] [address] [phone] fax   Problem List Items Addressed This Visit      Cardiovascular and Mediastinum   HTN (hypertension) - Primary     Other   Back pain   Relevant Medications   ibuprofen (ADVIL) 800 MG tablet   methocarbamol (ROBAXIN) 500 MG tablet   Vitamin D  deficiency    Other Visit Diagnoses    Screening for colon cancer       Health care maintenance       Relevant Orders   POCT urinalysis dipstick (Completed)   POCT glycosylated  hemoglobin (Hb A1C) (Completed)   POCT glucose (manual entry) (Completed)   CBC with Differential (Completed)   Comprehensive metabolic panel (Completed)   Lipid Panel (Completed)   TSH (Completed)   Vitamin B12 (Completed)   Vitamin D, 25-hydroxy (Completed)   Follow up          Meds ordered this encounter  Medications  . ibuprofen (ADVIL) 800 MG tablet    Sig: Take 1 tablet (800 mg total) by mouth every 8 (eight) hours as needed.    Dispense:  30 tablet    Refill:  3  . methocarbamol (ROBAXIN) 500 MG tablet    Sig: Take 1 tablet (500 mg total) by mouth 2 (two) times daily.    Dispense:  30 tablet    Refill:  3    Follow-up: No follow-ups on file.    Azzie Glatter, FNP

## 2019-08-06 LAB — VITAMIN B12: Vitamin B-12: 554 pg/mL (ref 232–1245)

## 2019-08-06 LAB — COMPREHENSIVE METABOLIC PANEL
ALT: 31 IU/L (ref 0–44)
AST: 33 IU/L (ref 0–40)
Albumin/Globulin Ratio: 1.8 (ref 1.2–2.2)
Albumin: 4.6 g/dL (ref 4.0–5.0)
Alkaline Phosphatase: 85 IU/L (ref 39–117)
BUN/Creatinine Ratio: 14 (ref 9–20)
BUN: 17 mg/dL (ref 6–24)
Bilirubin Total: 0.4 mg/dL (ref 0.0–1.2)
CO2: 25 mmol/L (ref 20–29)
Calcium: 9.6 mg/dL (ref 8.7–10.2)
Chloride: 102 mmol/L (ref 96–106)
Creatinine, Ser: 1.19 mg/dL (ref 0.76–1.27)
GFR calc Af Amer: 82 mL/min/{1.73_m2} (ref >59)
GFR calc non Af Amer: 71 mL/min/{1.73_m2} (ref >59)
Globulin, Total: 2.5 g/dL (ref 1.5–4.5)
Glucose: 103 mg/dL — ABNORMAL HIGH (ref 65–99)
Potassium: 3.8 mmol/L (ref 3.5–5.2)
Sodium: 140 mmol/L (ref 134–144)
Total Protein: 7.1 g/dL (ref 6.0–8.5)

## 2019-08-06 LAB — CBC WITH DIFFERENTIAL/PLATELET
Basophils Absolute: 0 10*3/uL (ref 0.0–0.2)
Basos: 1 %
EOS (ABSOLUTE): 0.2 10*3/uL (ref 0.0–0.4)
Eos: 4 %
Hematocrit: 40.8 % (ref 37.5–51.0)
Hemoglobin: 13.6 g/dL (ref 13.0–17.7)
Immature Grans (Abs): 0 10*3/uL (ref 0.0–0.1)
Immature Granulocytes: 0 %
Lymphocytes Absolute: 2.4 10*3/uL (ref 0.7–3.1)
Lymphs: 53 %
MCH: 28.9 pg (ref 26.6–33.0)
MCHC: 33.3 g/dL (ref 31.5–35.7)
MCV: 87 fL (ref 79–97)
Monocytes Absolute: 0.3 10*3/uL (ref 0.1–0.9)
Monocytes: 7 %
Neutrophils Absolute: 1.7 10*3/uL (ref 1.4–7.0)
Neutrophils: 35 %
Platelets: 295 10*3/uL (ref 150–450)
RBC: 4.7 x10E6/uL (ref 4.14–5.80)
RDW: 13.1 % (ref 11.6–15.4)
WBC: 4.7 10*3/uL (ref 3.4–10.8)

## 2019-08-06 LAB — LIPID PANEL
Chol/HDL Ratio: 5.2 ratio — ABNORMAL HIGH (ref 0.0–5.0)
Cholesterol, Total: 181 mg/dL (ref 100–199)
HDL: 35 mg/dL — ABNORMAL LOW (ref >39)
LDL Chol Calc (NIH): 125 mg/dL — ABNORMAL HIGH (ref 0–99)
Triglycerides: 113 mg/dL (ref 0–149)
VLDL Cholesterol Cal: 21 mg/dL (ref 5–40)

## 2019-08-06 LAB — TSH: TSH: 0.687 u[IU]/mL (ref 0.450–4.500)

## 2019-08-06 LAB — VITAMIN D 25 HYDROXY (VIT D DEFICIENCY, FRACTURES): Vit D, 25-Hydroxy: 28.2 ng/mL — ABNORMAL LOW (ref 30.0–100.0)

## 2019-08-06 MED FILL — AMLODIPINE BESYLATE 10 MG T: 10 | 30 days supply | Qty: 30 | Fill #2

## 2019-08-06 MED FILL — LOSARTAN POTASSIUM 50 MG TA: 50 | 30 days supply | Qty: 30 | Fill #2

## 2019-08-06 MED FILL — PRAVASTATIN SODIUM 80 MG TA: 80 | 30 days supply | Qty: 30 | Fill #2

## 2019-12-05 MED FILL — IBUPROFEN 800 MG TABLET: 800 | 10 days supply | Qty: 30 | Fill #1

## 2019-12-05 MED FILL — PRAVASTATIN SODIUM 80 MG TA: 80 | 30 days supply | Qty: 30 | Fill #3

## 2019-12-05 MED FILL — AMLODIPINE BESYLATE 10 MG T: 10 | 30 days supply | Qty: 30 | Fill #3

## 2019-12-05 MED FILL — LOSARTAN POTASSIUM 50 MG TA: 50 | 30 days supply | Qty: 30 | Fill #3

## 2020-01-29 MED FILL — AMLODIPINE BESYLATE 10 MG T: 10 | 30 days supply | Qty: 30 | Fill #4

## 2020-01-29 MED FILL — PRAVASTATIN SODIUM 80 MG TA: 80 | 30 days supply | Qty: 30 | Fill #4

## 2020-01-29 MED FILL — LOSARTAN POTASSIUM 50 MG TA: 50 | 30 days supply | Qty: 30 | Fill #4

## 2020-02-04 ENCOUNTER — Other Ambulatory Visit: Payer: Self-pay

## 2020-02-04 ENCOUNTER — Encounter: Payer: Self-pay | Admitting: Family Medicine

## 2020-02-04 ENCOUNTER — Ambulatory Visit (INDEPENDENT_AMBULATORY_CARE_PROVIDER_SITE_OTHER): Payer: Self-pay | Admitting: Family Medicine

## 2020-02-04 VITALS — BP 130/83 | HR 81 | Temp 98.4°F | Ht 67.0 in | Wt 214.0 lb

## 2020-02-04 DIAGNOSIS — Z1211 Encounter for screening for malignant neoplasm of colon: Secondary | ICD-10-CM

## 2020-02-04 DIAGNOSIS — Z9889 Other specified postprocedural states: Secondary | ICD-10-CM

## 2020-02-04 DIAGNOSIS — Z09 Encounter for follow-up examination after completed treatment for conditions other than malignant neoplasm: Secondary | ICD-10-CM

## 2020-02-04 DIAGNOSIS — I10 Essential (primary) hypertension: Secondary | ICD-10-CM

## 2020-02-04 DIAGNOSIS — M549 Dorsalgia, unspecified: Secondary | ICD-10-CM

## 2020-02-04 NOTE — Progress Notes (Signed)
[facility]    Established Patient Office Visit  Subjective:  Patient ID: Michael Fleming, male    DOB: 02-20-1971  Age: 49 y.o. MRN: [id number]  CC:  Chief Complaint  Patient presents with  . Follow-up    Pt states he wants to discuss his over all health for his age48.    HPI Michael Fleming is a 49 year old male who presents for Follow Up today.    Patient Active Problem List   Diagnosis Date Noted  . Hyperlipidemia 02/05/2019  . Hypokalemia 02/05/2019  . Vitamin D deficiency 02/05/2019  . Tick bite of groin 11/27/2018  . Back pain 08/06/2018  . Blurry vision 08/06/2018  . Pain in right wrist 03/27/2017  . Chronic pain of left knee 03/27/2017  . HTN (hypertension) 10/23/2016  . History of brain surgery 10/23/2016  . Chronic headache 10/23/2016    Current Status: Since her last office visit, she is doing well with no complaints. She denies visual changes, chest pain, cough, shortness of breath, heart palpitations, and falls. She has occasional headaches and dizziness with position changes. Denies severe headaches, confusion, seizures, double vision, and blurred vision, nausea and vomiting. She denies fevers, chills, fatigue, recent infections, weight loss, and night sweats. Denies GI problems such as diarrhea, and constipation. She has no reports of blood in stools, dysuria and hematuria. No depression or anxiety reported today. She is taking all medications as prescribed. She denies pain today.    Past Medical History:  Diagnosis Date  . Heart palpitations 12/2018  . Hypertension   . Hypokalemia 12/2018  . Seizures (HCC)    last seizure age 65    Past Surgical History:  Procedure Laterality Date  . BRAIN SURGERY     1981 blot clot removed  . FOOT SURGERY     right bunion removed    Family History  Problem Relation Age of Onset  . Healthy Mother   . Hypertension Father     Social History    Socioeconomic History  . Marital status: Single    Spouse name: Not on file  . Number of children: Not on file  . Years of education: Not on file  . Highest education level: Not on file  Occupational History  . Not on file  Tobacco Use  . Smoking status: Never Smoker  . Smokeless tobacco: Never Used  Vaping Use  . Vaping Use: Never used  Substance and Sexual Activity  . Alcohol use: Yes    Comment: occasional  . Drug use: No  . Sexual activity: Not Currently  Other Topics Concern  . Not on file  Social History Narrative  . Not on file   Social Determinants of Health   Financial Resource Strain:   . Difficulty of Paying Living Expenses: Not on file  Food Insecurity:   . Worried About Programme researcher, broadcasting/film/video in the Last Year: Not on file  . Ran Out of Food in the Last Year: Not on file  Transportation Needs:   . Lack of Transportation (Medical): Not on file  . Lack of Transportation (Non-Medical): Not on file  Physical Activity:   . Days of Exercise per Week: Not on file  . Minutes of Exercise per Session: Not on file  Stress:   . Feeling of Stress : Not on file  Social Connections:   . Frequency of Communication with Friends and Family: Not on file  . Frequency  of Social Gatherings with Friends and Family: Not on file  . Attends Religious Services: Not on file  . Active Member of Clubs or Organizations: Not on file  . Attends Banker Meetings: Not on file  . Marital Status: Not on file  Intimate Partner Violence:   . Fear of Current or Ex-Partner: Not on file  . Emotionally Abused: Not on file  . Physically Abused: Not on file  . Sexually Abused: Not on file    Outpatient Medications Prior to Visit  Medication Sig Dispense Refill  . amLODipine (NORVASC) 10 MG tablet Take 1 tablet (10 mg total) by mouth daily. 30 tablet 6  . ibuprofen (ADVIL) 800 MG tablet Take 1 tablet (800 mg total) by mouth every 8 (eight) hours as needed. 30 tablet 3  . losartan  (COZAAR) 50 MG tablet Take 1 tablet (50 mg total) by mouth daily. 30 tablet 6  . methocarbamol (ROBAXIN) 500 MG tablet Take 1 tablet (500 mg total) by mouth 2 (two) times daily. 30 tablet 3  . lidocaine (LIDODERM) 5 % Place 1 patch onto the skin daily. Remove & Discard patch within 12 hours or as directed by MD (Patient not taking: Reported on 02/04/2020) 14 patch 0  . pravastatin (PRAVACHOL) 80 MG tablet Take 1 tablet (80 mg total) by mouth daily. 30 tablet 6  . Vitamin D, Ergocalciferol, (DRISDOL) 1.25 MG (50000 UT) CAPS capsule Take 1 capsule (50,000 Units total) by mouth every 7 (seven) days. (Patient not taking: Reported on 02/04/2020) 5 capsule 6   No facility-administered medications prior to visit.    No Known Allergies  ROS Review of Systems  Constitutional: Negative.   HENT: Negative.   Eyes: Negative.   Respiratory: Negative.   Cardiovascular: Negative.   Gastrointestinal: Negative.   Endocrine: Negative.   Genitourinary: Negative.   Musculoskeletal: Negative.   Skin: Negative.   Allergic/Immunologic: Negative.   Neurological: Positive for dizziness (occasional ) and headaches (occasional ).  Hematological: Negative.   Psychiatric/Behavioral: Negative.       Objective:    Physical Exam Vitals and nursing note reviewed.  Constitutional:      Appearance: Normal appearance.  HENT:     Head: Normocephalic and atraumatic.     Nose: Nose normal.     Mouth/Throat:     Mouth: Mucous membranes are moist.     Pharynx: Oropharynx is clear.  Cardiovascular:     Rate and Rhythm: Normal rate and regular rhythm.     Pulses: Normal pulses.     Heart sounds: Normal heart sounds.  Pulmonary:     Effort: Pulmonary effort is normal.     Breath sounds: Normal breath sounds.  Abdominal:     General: Bowel sounds are normal.     Palpations: Abdomen is soft.  Musculoskeletal:        General: Normal range of motion.     Cervical back: Normal range of motion and neck supple.    Skin:    General: Skin is warm and dry.  Neurological:     General: No focal deficit present.     Mental Status: He is alert and oriented to person, place, and time.  Psychiatric:        Mood and Affect: Mood normal.        Behavior: Behavior normal.        Thought Content: Thought content normal.        Judgment: Judgment normal.     BP  130/83 (BP Location: Left Arm, Patient Position: Sitting, Cuff Size: Large)   Pulse 81   Temp 98.4 F (36.9 C)   Ht 5\' 7"  (1.702 m)   Wt 214 lb (97.1 kg)   SpO2 99%   BMI 33.52 kg/m  Wt Readings from Last 3 Encounters:  02/04/20 214 lb (97.1 kg)  08/05/19 217 lb 6.4 oz (98.6 kg)  05/31/19 218 lb (98.9 kg)     Health Maintenance Due  Topic Date Due  . Hepatitis C Screening  Never done  . COVID-19 Vaccine (1) Never done  . HIV Screening  Never done  . INFLUENZA VACCINE  12/22/2019    There are no preventive care reminders to display for this patient.  Lab Results  Component Value Date   TSH 0.687 08/05/2019   Lab Results  Component Value Date   WBC 4.7 08/05/2019   HGB 13.6 08/05/2019   HCT 40.8 08/05/2019   MCV 87 08/05/2019   PLT 295 08/05/2019   Lab Results  Component Value Date   NA 140 08/05/2019   K 3.8 08/05/2019   CO2 25 08/05/2019   GLUCOSE 103 (H) 08/05/2019   BUN 17 08/05/2019   CREATININE 1.19 08/05/2019   BILITOT 0.4 08/05/2019   ALKPHOS 85 08/05/2019   AST 33 08/05/2019   ALT 31 08/05/2019   PROT 7.1 08/05/2019   ALBUMIN 4.6 08/05/2019   CALCIUM 9.6 08/05/2019   ANIONGAP 10 01/08/2019   Lab Results  Component Value Date   CHOL 181 08/05/2019   Lab Results  Component Value Date   HDL 35 (L) 08/05/2019   Lab Results  Component Value Date   LDLCALC 125 (H) 08/05/2019   Lab Results  Component Value Date   TRIG 113 08/05/2019   Lab Results  Component Value Date   CHOLHDL 5.2 (H) 08/05/2019   Lab Results  Component Value Date   HGBA1C 5.5 08/05/2019   Assessment & Plan:   1.  Essential hypertension The current medical regimen is effective; blood pressure is stable at 130/83 today; continue present plan and medications as prescribed. He will continue to take medications as prescribed, to decrease high sodium intake, excessive alcohol intake, increase potassium intake, smoking cessation, and increase physical activity of at least 30 minutes of cardio activity daily. He will continue to follow Heart Healthy or DASH diet.  2. Back pain, unspecified back location, unspecified back pain laterality, unspecified chronicity  3. History of brain surgery  4. Colon cancer screening - Ambulatory referral to Gastroenterology  5. Follow up He will follow up in 6 months.   No orders of the defined types were placed in this encounter.   Orders Placed This Encounter  Procedures  . Ambulatory referral to Gastroenterology     Referral Orders     Ambulatory referral to Gastroenterology   Raliegh IpNatalie Sheana Bir,  MSN, FNP-BC [facility] [address]  [place], [place] [address] [phone] fax   Problem List Items Addressed This Visit      Cardiovascular and Mediastinum   HTN (hypertension) - Primary     Other   Back pain   History of brain surgery    Other Visit Diagnoses    Colon cancer screening       Relevant Orders   Ambulatory referral to Gastroenterology   Follow up          No orders of the defined types were placed  in this encounter.   Follow-up: Return for Labs/OV.    Kallie Locks, FNP

## 2020-02-05 ENCOUNTER — Ambulatory Visit: Payer: Self-pay | Admitting: Family Medicine

## 2020-02-06 ENCOUNTER — Encounter: Payer: Self-pay | Admitting: Family Medicine

## 2020-02-10 ENCOUNTER — Other Ambulatory Visit: Payer: Self-pay | Admitting: Family Medicine

## 2020-02-10 DIAGNOSIS — I10 Essential (primary) hypertension: Secondary | ICD-10-CM

## 2020-02-10 DIAGNOSIS — E785 Hyperlipidemia, unspecified: Secondary | ICD-10-CM

## 2020-02-25 ENCOUNTER — Encounter: Payer: Self-pay | Admitting: Gastroenterology

## 2020-03-20 ENCOUNTER — Other Ambulatory Visit: Payer: Self-pay | Admitting: Family Medicine

## 2020-03-23 MED FILL — PRAVASTATIN SODIUM 80 MG TA: 80 | 30 days supply | Qty: 30 | Fill #0

## 2020-03-23 MED FILL — AMLODIPINE BESYLATE 10 MG T: 10 | 30 days supply | Qty: 30 | Fill #0

## 2020-03-23 MED FILL — LOSARTAN POTASSIUM 50 MG TA: 50 | 30 days supply | Qty: 30 | Fill #0

## 2020-04-13 ENCOUNTER — Ambulatory Visit (AMBULATORY_SURGERY_CENTER): Payer: Self-pay | Admitting: *Deleted

## 2020-04-13 ENCOUNTER — Other Ambulatory Visit: Payer: Self-pay | Admitting: Gastroenterology

## 2020-04-13 ENCOUNTER — Other Ambulatory Visit: Payer: Self-pay

## 2020-04-13 VITALS — Ht 67.0 in | Wt 213.0 lb

## 2020-04-13 DIAGNOSIS — Z1211 Encounter for screening for malignant neoplasm of colon: Secondary | ICD-10-CM

## 2020-04-13 MED ORDER — SUPREP BOWEL PREP KIT 17.5-3.13-1.6 GM/177ML PO SOLN: 1.0000 | Freq: Once | ORAL | 0 refills | Status: DC

## 2020-04-13 MED FILL — SUPREP BOWEL PREP KIT: 17.5-3.13-1 | 1 days supply | Qty: 354 | Fill #0

## 2020-04-13 NOTE — Progress Notes (Signed)
Fully vax'd ° °No egg or soy allergy known to patient  °No issues with past sedation with any surgeries or procedures °no intubation problems in the past  °No FH of Malignant Hyperthermia °No diet pills per patient °No home 02 use per patient  °No blood thinners per patient  °Pt denies issues with constipation  °No A fib or A flutter  °EMMI video to pt or via MyChart  °COVID 19 guidelines implemented in [facility] today with Pt and RN  ° ° °Due to the COVID-19 pandemic we are asking patients to follow these guidelines. Please only bring one care partner. Please be aware that your care partner may wait in the car in the parking lot or if they feel like they will be too hot to wait in the car, they may wait in the lobby on the 4th floor. All care partners are required to wear a mask the entire time (we do not have any that we can provide them), they need to practice social distancing, and we will do a Covid check for all patient's and care partners when you arrive. Also we will check their temperature and your temperature. If the care partner waits in their car they need to stay in the parking lot the entire time and we will call them on their cell phone when the patient is ready for discharge so they can bring the car to the front of the building. Also all patient's will need to wear a mask into building. ° °

## 2020-04-27 ENCOUNTER — Encounter: Payer: Self-pay | Admitting: Gastroenterology

## 2020-04-27 ENCOUNTER — Ambulatory Visit (AMBULATORY_SURGERY_CENTER): Payer: Self-pay | Admitting: Gastroenterology

## 2020-04-27 ENCOUNTER — Other Ambulatory Visit: Payer: Self-pay

## 2020-04-27 VITALS — BP 144/78 | HR 69 | Temp 98.0°F | Resp 14 | Ht 67.0 in | Wt 213.0 lb

## 2020-04-27 DIAGNOSIS — Z1211 Encounter for screening for malignant neoplasm of colon: Secondary | ICD-10-CM

## 2020-04-27 MED ORDER — SODIUM CHLORIDE 0.9 % IV SOLN: 500.0000 mL | Freq: Once | INTRAVENOUS | Status: DC

## 2020-04-27 NOTE — Progress Notes (Signed)
Report given to PACU, vss 

## 2020-04-27 NOTE — Op Note (Signed)
[facility] Patient Name: Michael Fleming Procedure Date: 04/27/2020 11:37 AM MRN: [id number] Endoscopist: Viviann Spare P. Adela Lank , MD Age: 49 Referring MD:  Date of Birth: [birth year] Gender: Male Account #: [id number] Procedure:                Colonoscopy Indications:              Screening for colorectal malignant neoplasm, This                            is the patient's first colonoscopy Medicines:                Monitored Anesthesia Care Procedure:                Pre-Anesthesia Assessment:                           - Prior to the procedure, a History and Physical                            was performed, and patient medications and                            allergies were reviewed. The patient's tolerance of                            previous anesthesia was also reviewed. The risks                            and benefits of the procedure and the sedation                            options and risks were discussed with the patient.                            All questions were answered, and informed consent                            was obtained. Prior Anticoagulants: The patient has                            taken no previous anticoagulant or antiplatelet                            agents. ASA Grade Assessment: II - A patient with                            mild systemic disease. After reviewing the risks                            and benefits, the patient was deemed in                            satisfactory condition to undergo the procedure.  After obtaining informed consent, the colonoscope                            was passed under direct vision. Throughout the                            procedure, the patient's blood pressure, pulse, and                            oxygen saturations were monitored continuously. The                            Colonoscope was introduced through the anus and                            advanced to the the  cecum, identified by                            appendiceal orifice and ileocecal valve. The                            colonoscopy was performed without difficulty. The                            patient tolerated the procedure well. The quality                            of the bowel preparation was good. The ileocecal                            valve, appendiceal orifice, and rectum were                            photographed. Scope In: 11:41:24 AM Scope Out: 11:58:11 AM Scope Withdrawal Time: 0 hours 13 minutes 39 seconds  Total Procedure Duration: 0 hours 16 minutes 47 seconds  Findings:                 The perianal and digital rectal examinations were                            normal.                           A few small-mouthed diverticula were found in the                            sigmoid colon.                           Internal hemorrhoids were found during                            retroflexion. The hemorrhoids were small.  The exam was otherwise without abnormality. Complications:            No immediate complications. Estimated blood loss:                            None. Estimated Blood Loss:     Estimated blood loss: none. Impression:               - Diverticulosis in the sigmoid colon.                           - Internal hemorrhoids.                           - The examination was otherwise normal.                           - No polyps Recommendation:           - Patient has a contact number available for                            emergencies. The signs and symptoms of potential                            delayed complications were discussed with the                            patient. Return to normal activities tomorrow.                            Written discharge instructions were provided to the                            patient.                           - Resume previous diet.                           - Continue present  medications.                           - Repeat colonoscopy in 10 years for screening                            purposes. Viviann Spare P. Jadee Golebiewski, MD 04/27/2020 12:01:17 PM This report has been signed electronically.

## 2020-04-27 NOTE — Patient Instructions (Signed)
HANDOUTS PROVIDED ON: Diverticulosis and Hemorrhoids  Your next colonoscopy should occur in 10 years  You may resume your previous diet and medication schedule.  Thank you for allowing [place] to care for you today!!!     YOU HAD AN ENDOSCOPIC PROCEDURE TODAY AT THE [facility]:   Refer to the procedure report that was given to you for any specific questions about what was found during the examination.  If the procedure report does not answer your questions, please call your gastroenterologist to clarify.  If you requested that your care partner not be given the details of your procedure findings, then the procedure report has been included in a sealed envelope for you to review at your convenience later.  YOU SHOULD EXPECT: Some feelings of bloating in the abdomen. Passage of more gas than usual.  Walking can help get rid of the air that was put into your GI tract during the procedure and reduce the bloating. If you had a lower endoscopy (such as a colonoscopy or flexible sigmoidoscopy) you may notice spotting of blood in your stool or on the toilet paper. If you underwent a bowel prep for your procedure, you may not have a normal bowel movement for a few days.  Please Note:  You might notice some irritation and congestion in your nose or some drainage.  This is from the oxygen used during your procedure.  There is no need for concern and it should clear up in a day or so.  SYMPTOMS TO REPORT IMMEDIATELY:   Following lower endoscopy (colonoscopy or flexible sigmoidoscopy):  Excessive amounts of blood in the stool  Significant tenderness or worsening of abdominal pains  Swelling of the abdomen that is new, acute  Fever of 100F or higher   For urgent or emergent issues, a gastroenterologist can be reached at any hour by calling [phone]. Do not use MyChart messaging for urgent concerns.    DIET:  We do recommend a small meal at first, but then you may proceed to your  regular diet.  Drink plenty of fluids but you should avoid alcoholic beverages for 24 hours.  ACTIVITY:  You should plan to take it easy for the rest of today and you should NOT DRIVE or use heavy machinery until tomorrow (because of the sedation medicines used during the test).    FOLLOW UP: Our staff will call the number listed on your records 48-72 hours following your procedure to check on you and address any questions or concerns that you may have regarding the information given to you following your procedure. If we do not reach you, we will leave a message.  We will attempt to reach you two times.  During this call, we will ask if you have developed any symptoms of COVID 19. If you develop any symptoms (ie: fever, flu-like symptoms, shortness of breath, cough etc.) before then, please call [phone].  If you test positive for Covid 19 in the 2 weeks post procedure, please call and report this information to [place].    If any biopsies were taken you will be contacted by phone or by letter within the next 1-3 weeks.  Please call us at [phone] if you have not heard about the biopsies in 3 weeks.    SIGNATURES/CONFIDENTIALITY: You and/or your care partner have signed paperwork which will be entered into your electronic medical record.  These signatures attest to the fact that that the information above on your After Visit Summary  has been reviewed and is understood.  Full responsibility of the confidentiality of this discharge information lies with you and/or your care-partner. 

## 2020-04-27 NOTE — Progress Notes (Signed)
Pt's states no medical or surgical changes since previsit or office visit. 

## 2020-04-29 ENCOUNTER — Telehealth: Payer: Self-pay

## 2020-04-29 ENCOUNTER — Telehealth: Payer: Self-pay | Admitting: *Deleted

## 2020-04-29 NOTE — Telephone Encounter (Signed)
  Follow up Call-  Call back number 04/27/2020  Post procedure Call Back phone  # [phone]  Permission to leave phone message Yes  Some recent data might be hidden     Patient questions:  Do you have a fever, pain , or abdominal swelling? No. Pain Score  0 *  Have you tolerated food without any problems? Yes.    Have you been able to return to your normal activities? Yes.    Do you have any questions about your discharge instructions: Diet   No. Medications  No. Follow up visit  No.  Do you have questions or concerns about your Care? No.  Actions: * If pain score is 4 or above: 1. No action needed, pain <4.Have you developed a fever since your procedure? no  2.   Have you had an respiratory symptoms (SOB or cough) since your procedure? no  3.   Have you tested positive for COVID 19 since your procedure no  4.   Have you had any family members/close contacts diagnosed with the COVID 19 since your procedure?  no   If yes to any of these questions please route to Laverna Peace, RN and Karlton Lemon, RN

## 2020-04-29 NOTE — Telephone Encounter (Signed)
Follow up call made. 

## 2020-06-03 MED FILL — LOSARTAN POTASSIUM 50 MG TA: 50 | 30 days supply | Qty: 30 | Fill #1

## 2020-06-03 MED FILL — PRAVASTATIN SODIUM 80 MG TA: 80 | 30 days supply | Qty: 30 | Fill #1

## 2020-06-03 MED FILL — IBUPROFEN 800 MG TABLET: 800 | 10 days supply | Qty: 30 | Fill #2

## 2020-06-03 MED FILL — AMLODIPINE BESYLATE 10 MG T: 10 | 30 days supply | Qty: 30 | Fill #1

## 2020-07-20 MED FILL — AMLODIPINE BESYLATE 10 MG T: 10 | 30 days supply | Qty: 30 | Fill #2

## 2020-07-20 MED FILL — PRAVASTATIN SODIUM 80 MG TA: 80 | 30 days supply | Qty: 30 | Fill #2

## 2020-07-20 MED FILL — LOSARTAN POTASSIUM 50 MG TA: 50 | 30 days supply | Qty: 30 | Fill #2

## 2020-07-20 MED FILL — IBUPROFEN 800 MG TABLET: 800 | 10 days supply | Qty: 30 | Fill #3

## 2020-08-03 ENCOUNTER — Ambulatory Visit: Payer: Self-pay | Admitting: Family Medicine

## 2020-08-05 ENCOUNTER — Other Ambulatory Visit: Payer: Self-pay

## 2020-08-05 ENCOUNTER — Encounter: Payer: Self-pay | Admitting: Family Medicine

## 2020-08-05 ENCOUNTER — Other Ambulatory Visit: Payer: Self-pay | Admitting: Family Medicine

## 2020-08-05 ENCOUNTER — Ambulatory Visit (INDEPENDENT_AMBULATORY_CARE_PROVIDER_SITE_OTHER): Payer: Self-pay | Admitting: Family Medicine

## 2020-08-05 VITALS — BP 146/91 | HR 86 | Ht 67.0 in | Wt 217.0 lb

## 2020-08-05 DIAGNOSIS — N529 Male erectile dysfunction, unspecified: Secondary | ICD-10-CM

## 2020-08-05 DIAGNOSIS — I10 Essential (primary) hypertension: Secondary | ICD-10-CM

## 2020-08-05 DIAGNOSIS — Z0000 Encounter for general adult medical examination without abnormal findings: Secondary | ICD-10-CM

## 2020-08-05 DIAGNOSIS — Z09 Encounter for follow-up examination after completed treatment for conditions other than malignant neoplasm: Secondary | ICD-10-CM

## 2020-08-05 DIAGNOSIS — Z760 Encounter for issue of repeat prescription: Secondary | ICD-10-CM

## 2020-08-05 DIAGNOSIS — E785 Hyperlipidemia, unspecified: Secondary | ICD-10-CM

## 2020-08-05 DIAGNOSIS — M549 Dorsalgia, unspecified: Secondary | ICD-10-CM

## 2020-08-05 DIAGNOSIS — Z9889 Other specified postprocedural states: Secondary | ICD-10-CM

## 2020-08-05 MED ORDER — PRAVASTATIN SODIUM 80 MG PO TABS: 80.0000 mg | ORAL_TABLET | Freq: Every day | ORAL | 11 refills | Status: DC

## 2020-08-05 MED ORDER — SILDENAFIL CITRATE 100 MG PO TABS: 50.0000 mg | ORAL_TABLET | Freq: Every day | ORAL | 3 refills | Status: DC | PRN

## 2020-08-05 MED ORDER — LOSARTAN POTASSIUM 50 MG PO TABS: 50.0000 mg | ORAL_TABLET | Freq: Every day | ORAL | 11 refills | Status: DC

## 2020-08-05 MED ORDER — IBUPROFEN 800 MG PO TABS: 800.0000 mg | ORAL_TABLET | Freq: Three times a day (TID) | ORAL | 11 refills | Status: DC | PRN

## 2020-08-05 MED ORDER — AMLODIPINE BESYLATE 10 MG PO TABS: 10.0000 mg | ORAL_TABLET | Freq: Every day | ORAL | 11 refills | Status: DC

## 2020-08-05 NOTE — Progress Notes (Signed)
[facility]    Established Patient Office Visit  Subjective:  Patient ID: Michael LopeJohnny L Anthis, male    DOB: 05/27/1970  Age: 50 y.o. MRN: [id number]  CC:  Chief Complaint  Patient presents with  . Hypertension  . Back Pain    HPI Michael Fleming is a 50 year old male who presents for Follow Up today.   Patient Active Problem List   Diagnosis Date Noted  . Hyperlipidemia 02/05/2019  . Hypokalemia 02/05/2019  . Vitamin D deficiency 02/05/2019  . Tick bite of groin 11/27/2018  . Back pain 08/06/2018  . Blurry vision 08/06/2018  . Pain in right wrist 03/27/2017  . Chronic pain of left knee 03/27/2017  . HTN (hypertension) 10/23/2016  . History of brain surgery 10/23/2016  . Chronic headache 10/23/2016   Current Status: Since his last office visit, he is doing well with no complaints. He denies visual changes, chest pain, cough, shortness of breath, heart palpitations, and falls. He has occasional headaches and dizziness with position changes. Denies severe headaches, confusion, seizures, double vision, and blurred vision, nausea and vomiting. He denies fevers, chills, fatigue, recent infections, weight loss, and night sweats. Denies GI problems such as diarrhea, and constipation. She has no reports of blood in stools, dysuria and hematuria. No depression or anxiety reported today. He is taking all medications as prescribed. He denies pain today.   Past Medical History:  Diagnosis Date  . Allergy   . GERD (gastroesophageal reflux disease)   . Heart palpitations 12/2018  . Hyperlipidemia   . Hypertension   . Hypokalemia 12/2018  . Seizures (HCC)    last seizure age 50 due to blood clot- 1981 none since     Past Surgical History:  Procedure Laterality Date  . BRAIN SURGERY     1981 blot clot removed  . FOOT SURGERY     right bunion removed    Family History  Problem Relation Age of Onset  . Healthy Mother   . Hypertension  Father   . Colon cancer Neg Hx   . Colon polyps Neg Hx   . Esophageal cancer Neg Hx   . Rectal cancer Neg Hx   . Stomach cancer Neg Hx     Social History   Socioeconomic History  . Marital status: Single    Spouse name: Not on file  . Number of children: Not on file  . Years of education: Not on file  . Highest education level: Not on file  Occupational History  . Not on file  Tobacco Use  . Smoking status: Never Smoker  . Smokeless tobacco: Never Used  Vaping Use  . Vaping Use: Never used  Substance and Sexual Activity  . Alcohol use: Yes    Comment: occasional  . Drug use: No  . Sexual activity: Not Currently  Other Topics Concern  . Not on file  Social History Narrative  . Not on file   Social Determinants of Health   Financial Resource Strain: Not on file  Food Insecurity: Not on file  Transportation Needs: Not on file  Physical Activity: Not on file  Stress: Not on file  Social Connections: Not on file  Intimate Partner Violence: Not on file    Outpatient Medications Prior to Visit  Medication Sig Dispense Refill  . amLODipine (NORVASC) 10 MG tablet TAKE 1 TABLET (10 MG TOTAL) BY MOUTH DAILY. 30 tablet 6  . ibuprofen (ADVIL) 800 MG  tablet Take 1 tablet (800 mg total) by mouth every 8 (eight) hours as needed. 30 tablet 3  . losartan (COZAAR) 50 MG tablet TAKE 1 TABLET (50 MG TOTAL) BY MOUTH DAILY. 30 tablet 6  . pravastatin (PRAVACHOL) 80 MG tablet TAKE 1 TABLET (80 MG TOTAL) BY MOUTH DAILY. 30 tablet 6  . lidocaine (LIDODERM) 5 % Place 1 patch onto the skin daily. Remove & Discard patch within 12 hours or as directed by MD (Patient not taking: Reported on 04/27/2020) 14 patch 0  . methocarbamol (ROBAXIN) 500 MG tablet Take 1 tablet (500 mg total) by mouth 2 (two) times daily. (Patient not taking: Reported on 04/27/2020) 30 tablet 3   No facility-administered medications prior to visit.    No Known Allergies  ROS Review of Systems  Constitutional:  Negative.   HENT: Negative.   Eyes: Negative.   Respiratory: Negative.   Cardiovascular: Negative.   Gastrointestinal: Negative.   Endocrine: Negative.   Genitourinary: Negative.   Musculoskeletal: Positive for back pain (chronic ).  Skin: Negative.   Allergic/Immunologic: Negative.   Neurological: Positive for dizziness (occasional ) and headaches (occasional ).  Hematological: Negative.   Psychiatric/Behavioral: Negative.       Objective:    Physical Exam Vitals and nursing note reviewed.  Constitutional:      Appearance: Normal appearance.  HENT:     Head: Normocephalic and atraumatic.     Nose: Nose normal.     Mouth/Throat:     Mouth: Mucous membranes are moist.     Pharynx: Oropharynx is clear.  Cardiovascular:     Rate and Rhythm: Normal rate and regular rhythm.     Pulses: Normal pulses.     Heart sounds: Normal heart sounds.  Pulmonary:     Effort: Pulmonary effort is normal.     Breath sounds: Normal breath sounds.  Abdominal:     General: Bowel sounds are normal.     Palpations: Abdomen is soft.  Musculoskeletal:        General: Normal range of motion.     Cervical back: Normal range of motion and neck supple.  Skin:    General: Skin is warm and dry.  Neurological:     General: No focal deficit present.     Mental Status: He is alert and oriented to person, place, and time.  Psychiatric:        Mood and Affect: Mood normal.        Behavior: Behavior normal.        Thought Content: Thought content normal.        Judgment: Judgment normal.    BP (!) 146/91   Pulse 86   Ht 5\' 7"  (1.702 m)   Wt 217 lb (98.4 kg)   SpO2 99%   BMI 33.99 kg/m  Wt Readings from Last 3 Encounters:  08/05/20 217 lb (98.4 kg)  04/27/20 213 lb (96.6 kg)  04/13/20 213 lb (96.6 kg)    There are no preventive care reminders to display for this patient.  There are no preventive care reminders to display for this patient.  Lab Results  Component Value Date   TSH 0.687  08/05/2019   Lab Results  Component Value Date   WBC 4.7 08/05/2019   HGB 13.6 08/05/2019   HCT 40.8 08/05/2019   MCV 87 08/05/2019   PLT 295 08/05/2019   Lab Results  Component Value Date   NA 140 08/05/2019   K 3.8 08/05/2019  CO2 25 08/05/2019   GLUCOSE 103 (H) 08/05/2019   BUN 17 08/05/2019   CREATININE 1.19 08/05/2019   BILITOT 0.4 08/05/2019   ALKPHOS 85 08/05/2019   AST 33 08/05/2019   ALT 31 08/05/2019   PROT 7.1 08/05/2019   ALBUMIN 4.6 08/05/2019   CALCIUM 9.6 08/05/2019   ANIONGAP 10 01/08/2019   Lab Results  Component Value Date   CHOL 181 08/05/2019   Lab Results  Component Value Date   HDL 35 (L) 08/05/2019   Lab Results  Component Value Date   LDLCALC 125 (H) 08/05/2019   Lab Results  Component Value Date   TRIG 113 08/05/2019   Lab Results  Component Value Date   CHOLHDL 5.2 (H) 08/05/2019   Lab Results  Component Value Date   HGBA1C 5.5 08/05/2019   Assessment & Plan:   1. Annual physical  Physical assessment within normal for age. Basic Neurology assessment normal.  Follow-up for scheduled mammogram as needed.  Recommend monthly self breast exam Recommend daily multivitamin for women Recommend strength training in 150 minutes of cardiovascular exercise per week  2. Back pain, unspecified back location, unspecified back pain laterality, unspecified chronicity - ibuprofen (ADVIL) 800 MG tablet; Take 1 tablet (800 mg total) by mouth every 8 (eight) hours as needed.  Dispense: 60 tablet; Refill: 11  3. Essential hypertension The current medical regimen is effective; blood pressure is stable today; continue present plan and medications as prescribed. He will continue to take medications as prescribed, to decrease high sodium intake, excessive alcohol intake, increase potassium intake, smoking cessation, and increase physical activity of at least 30 minutes of cardio activity daily. He will continue to follow Heart Healthy or DASH diet. -  amLODipine (NORVASC) 10 MG tablet; Take 1 tablet (10 mg total) by mouth daily.  Dispense: 30 tablet; Refill: 11 - losartan (COZAAR) 50 MG tablet; Take 1 tablet (50 mg total) by mouth daily.  Dispense: 30 tablet; Refill: 11  4. Hyperlipidemia, unspecified hyperlipidemia type - pravastatin (PRAVACHOL) 80 MG tablet; Take 1 tablet (80 mg total) by mouth daily.  Dispense: 30 tablet; Refill: 11  5. Erectile dysfunction, unspecified erectile dysfunction type - sildenafil (VIAGRA) 100 MG tablet; Take 0.5-1 tablets (50-100 mg total) by mouth daily as needed for erectile dysfunction.  Dispense: 30 tablet; Refill: 3  6. History of brain surgery  7. Medication refill  8. Follow up He will follow up in 1 year.   Meds ordered this encounter  Medications  . amLODipine (NORVASC) 10 MG tablet    Sig: Take 1 tablet (10 mg total) by mouth daily.    Dispense:  30 tablet    Refill:  11  . ibuprofen (ADVIL) 800 MG tablet    Sig: Take 1 tablet (800 mg total) by mouth every 8 (eight) hours as needed.    Dispense:  60 tablet    Refill:  11  . losartan (COZAAR) 50 MG tablet    Sig: Take 1 tablet (50 mg total) by mouth daily.    Dispense:  30 tablet    Refill:  11  . pravastatin (PRAVACHOL) 80 MG tablet    Sig: Take 1 tablet (80 mg total) by mouth daily.    Dispense:  30 tablet    Refill:  11  . sildenafil (VIAGRA) 100 MG tablet    Sig: Take 0.5-1 tablets (50-100 mg total) by mouth daily as needed for erectile dysfunction.    Dispense:  30 tablet  Refill:  3    No orders of the defined types were placed in this encounter.   Referral Orders  No referral(s) requested today    Raliegh Ip, MSN, ANE, FNP-BC [facility] [address] [place], [place] [address] [phone] fax  Problem List Items Addressed This Visit      Other   Back pain   Relevant Medications   ibuprofen (ADVIL) 800  MG tablet   History of brain surgery   Hyperlipidemia   Relevant Medications   amLODipine (NORVASC) 10 MG tablet   losartan (COZAAR) 50 MG tablet   pravastatin (PRAVACHOL) 80 MG tablet   sildenafil (VIAGRA) 100 MG tablet    Other Visit Diagnoses    Annual physical exam    -  Primary   Essential hypertension       Relevant Medications   amLODipine (NORVASC) 10 MG tablet   losartan (COZAAR) 50 MG tablet   pravastatin (PRAVACHOL) 80 MG tablet   sildenafil (VIAGRA) 100 MG tablet   Erectile dysfunction, unspecified erectile dysfunction type       Relevant Medications   sildenafil (VIAGRA) 100 MG tablet   Medication refill       Follow up          Meds ordered this encounter  Medications  . amLODipine (NORVASC) 10 MG tablet    Sig: Take 1 tablet (10 mg total) by mouth daily.    Dispense:  30 tablet    Refill:  11  . ibuprofen (ADVIL) 800 MG tablet    Sig: Take 1 tablet (800 mg total) by mouth every 8 (eight) hours as needed.    Dispense:  60 tablet    Refill:  11  . losartan (COZAAR) 50 MG tablet    Sig: Take 1 tablet (50 mg total) by mouth daily.    Dispense:  30 tablet    Refill:  11  . pravastatin (PRAVACHOL) 80 MG tablet    Sig: Take 1 tablet (80 mg total) by mouth daily.    Dispense:  30 tablet    Refill:  11  . sildenafil (VIAGRA) 100 MG tablet    Sig: Take 0.5-1 tablets (50-100 mg total) by mouth daily as needed for erectile dysfunction.    Dispense:  30 tablet    Refill:  3    Follow-up: No follow-ups on file.    Kallie Locks, FNP

## 2020-08-05 NOTE — Patient Instructions (Addendum)
High-Fiber Eating Plan Fiber, also called dietary fiber, is a type of carbohydrate. It is found foods such as fruits, vegetables, whole grains, and beans. A high-fiber diet can have many health benefits. Your health care provider may recommend a high-fiber diet to help:  Prevent constipation. Fiber can make your bowel movements more regular.  Lower your cholesterol.  Relieve the following conditions: ? Inflammation of veins in the anus (hemorrhoids). ? Inflammation of specific areas of the digestive tract (uncomplicated diverticulosis). ? A problem of the large intestine, also called the colon, that sometimes causes pain and diarrhea (irritable bowel syndrome, or IBS).  Prevent overeating as part of a weight-loss plan.  Prevent heart disease, type 2 diabetes, and certain cancers. What are tips for following this plan? Reading food labels  Check the nutrition facts label on food products for the amount of dietary fiber. Choose foods that have 5 grams of fiber or more per serving.  The goals for recommended daily fiber intake include: ? Men (age 50 or younger): 34-38 g. ? Men (over age 50): 28-34 g. ? Women (age 50 or younger): 25-28 g. ? Women (over age 50): 22-25 g. Your daily fiber goal is _____________ g.   Shopping  Choose whole fruits and vegetables instead of processed forms, such as apple juice or applesauce.  Choose a wide variety of high-fiber foods such as avocados, lentils, oats, and kidney beans.  Read the nutrition facts label of the foods you choose. Be aware of foods with added fiber. These foods often have high sugar and sodium amounts per serving. Cooking  Use whole-grain flour for baking and cooking.  Cook with brown rice instead of white rice. Meal planning  Start the day with a breakfast that is high in fiber, such as a cereal that contains 5 g of fiber or more per serving.  Eat breads and cereals that are made with whole-grain flour instead of refined  flour or white flour.  Eat brown rice, bulgur wheat, or millet instead of white rice.  Use beans in place of meat in soups, salads, and pasta dishes.  Be sure that half of the grains you eat each day are whole grains. General information  You can get the recommended daily intake of dietary fiber by: ? Eating a variety of fruits, vegetables, grains, nuts, and beans. ? Taking a fiber supplement if you are not able to take in enough fiber in your diet. It is better to get fiber through food than from a supplement.  Gradually increase how much fiber you consume. If you increase your intake of dietary fiber too quickly, you may have bloating, cramping, or gas.  Drink plenty of water to help you digest fiber.  Choose high-fiber snacks, such as berries, raw vegetables, nuts, and popcorn. What foods should I eat? Fruits Berries. Pears. Apples. Oranges. Avocado. Prunes and raisins. Dried figs. Vegetables Sweet potatoes. Spinach. Kale. Artichokes. Cabbage. Broccoli. Cauliflower. Green peas. Carrots. Squash. Grains Whole-grain breads. Multigrain cereal. Oats and oatmeal. Brown rice. Barley. Bulgur wheat. Millet. Quinoa. Bran muffins. Popcorn. Rye wafer crackers. Meats and other proteins Navy beans, kidney beans, and pinto beans. Soybeans. Split peas. Lentils. Nuts and seeds. Dairy Fiber-fortified yogurt. Beverages Fiber-fortified soy milk. Fiber-fortified orange juice. Other foods Fiber bars. The items listed above may not be a complete list of recommended foods and beverages. Contact a dietitian for more information. What foods should I avoid? Fruits Fruit juice. Cooked, strained fruit. Vegetables Fried potatoes. Canned vegetables. Well-cooked vegetables. Grains   White bread. Pasta made with refined flour. White rice. Meats and other proteins Fatty cuts of meat. Fried chicken or fried fish. Dairy Milk. Yogurt. Cream cheese. Sour cream. Fats and oils Butters. Beverages Soft  drinks. Other foods Cakes and pastries. The items listed above may not be a complete list of foods and beverages to avoid. Talk with your dietitian about what choices are best for you. Summary  Fiber is a type of carbohydrate. It is found in foods such as fruits, vegetables, whole grains, and beans.  A high-fiber diet has many benefits. It can help to prevent constipation, lower blood cholesterol, aid weight loss, and reduce your risk of heart disease, diabetes, and certain cancers.  Increase your intake of fiber gradually. Increasing fiber too quickly may cause cramping, bloating, and gas. Drink plenty of water while you increase the amount of fiber you consume.  The best sources of fiber include whole fruits and vegetables, whole grains, nuts, seeds, and beans. This information is not intended to replace advice given to you by your health care provider. Make sure you discuss any questions you have with your health care provider. Document Revised: 09/12/2019 Document Reviewed: 09/12/2019 Elsevier Patient Education  2021 Elsevier Inc.   Sildenafil tablets (Erectile Dysfunction) What is this medicine? SILDENAFIL (sil DEN a fil) is used to treat erection problems in men. This medicine may be used for other purposes; ask your health care provider or pharmacist if you have questions. COMMON BRAND NAME(S): Viagra What should I tell my health care provider before I take this medicine? They need to know if you have any of these conditions:  bleeding disorders  eye or vision problems, including a rare inherited eye disease called retinitis pigmentosa  anatomical deformation of the penis, Peyronie's disease, or history of priapism (painful and prolonged erection)  heart disease, angina, a history of heart attack, irregular heart beats, or other heart problems  high or low blood pressure  history of blood diseases, like sickle cell anemia or leukemia  history of stomach  bleeding  kidney disease  liver disease  stroke  an unusual or allergic reaction to sildenafil, other medicines, foods, dyes, or preservatives  pregnant or trying to get pregnant  breast-feeding How should I use this medicine? Take this medicine by mouth with a glass of water. Follow the directions on the prescription label. The dose is usually taken 1 hour before sexual activity. You should not take the dose more than once per day. Do not take your medicine more often than directed. Talk to your pediatrician regarding the use of this medicine in children. This medicine is not used in children for this condition. Overdosage: If you think you have taken too much of this medicine contact a poison control center or emergency room at once. NOTE: This medicine is only for you. Do not share this medicine with others. What if I miss a dose? This does not apply. Do not take double or extra doses. What may interact with this medicine? Do not take this medicine with any of the following medications:  cisapride  nitrates like amyl nitrite, isosorbide dinitrate, isosorbide mononitrate, nitroglycerin  riociguat This medicine may also interact with the following medications:  antiviral medicines for HIV or AIDS  bosentan  certain medicines for benign prostatic hyperplasia (BPH)  certain medicines for blood pressure  certain medicines for fungal infections like ketoconazole and itraconazole  cimetidine  erythromycin  rifampin This list may not describe all possible interactions. Give your health  care provider a list of all the medicines, herbs, non-prescription drugs, or dietary supplements you use. Also tell them if you smoke, drink alcohol, or use illegal drugs. Some items may interact with your medicine. What should I watch for while using this medicine? If you notice any changes in your vision while taking this drug, call your doctor or health care professional as soon as  possible. Stop using this medicine and call your health care provider right away if you have a loss of sight in one or both eyes. Contact your doctor or health care professional right away if you have an erection that lasts longer than 4 hours or if it becomes painful. This may be a sign of a serious problem and must be treated right away to prevent permanent damage. If you experience symptoms of nausea, dizziness, chest pain or arm pain upon initiation of sexual activity after taking this medicine, you should refrain from further activity and call your doctor or health care professional as soon as possible. Do not drink alcohol to excess (examples, 5 glasses of wine or 5 shots of whiskey) when taking this medicine. When taken in excess, alcohol can increase your chances of getting a headache or getting dizzy, increasing your heart rate or lowering your blood pressure. Using this medicine does not protect you or your partner against HIV infection (the virus that causes AIDS) or other sexually transmitted diseases. What side effects may I notice from receiving this medicine? Side effects that you should report to your doctor or health care professional as soon as possible:  allergic reactions like skin rash, itching or hives, swelling of the face, lips, or tongue  breathing problems  changes in hearing  changes in vision  chest pain  fast, irregular heartbeat  prolonged or painful erection  seizures Side effects that usually do not require medical attention (report to your doctor or health care professional if they continue or are bothersome):  back pain  dizziness  flushing  headache  indigestion  muscle aches  nausea  stuffy or runny nose This list may not describe all possible side effects. Call your doctor for medical advice about side effects. You may report side effects to FDA at 1-800-FDA-1088. Where should I keep my medicine? Keep out of reach of children. Store at  room temperature between 15 and 30 degrees C (59 and 86 degrees F). Throw away any unused medicine after the expiration date. NOTE: This sheet is a summary. It may not cover all possible information. If you have questions about this medicine, talk to your doctor, pharmacist, or health care provider.  2021 Elsevier/Gold Standard (2015-04-22 12:00:25)

## 2020-08-06 LAB — CBC WITH DIFFERENTIAL/PLATELET
Basophils Absolute: 0 10*3/uL (ref 0.0–0.2)
Basos: 1 %
EOS (ABSOLUTE): 0.1 10*3/uL (ref 0.0–0.4)
Eos: 3 %
Hematocrit: 41.8 % (ref 37.5–51.0)
Hemoglobin: 13.8 g/dL (ref 13.0–17.7)
Immature Grans (Abs): 0 10*3/uL (ref 0.0–0.1)
Immature Granulocytes: 0 %
Lymphocytes Absolute: 2.4 10*3/uL (ref 0.7–3.1)
Lymphs: 51 %
MCH: 28.3 pg (ref 26.6–33.0)
MCHC: 33 g/dL (ref 31.5–35.7)
MCV: 86 fL (ref 79–97)
Monocytes Absolute: 0.3 10*3/uL (ref 0.1–0.9)
Monocytes: 7 %
Neutrophils Absolute: 1.8 10*3/uL (ref 1.4–7.0)
Neutrophils: 38 %
Platelets: 291 10*3/uL (ref 150–450)
RBC: 4.88 x10E6/uL (ref 4.14–5.80)
RDW: 13.1 % (ref 11.6–15.4)
WBC: 4.7 10*3/uL (ref 3.4–10.8)

## 2020-08-06 LAB — COMPREHENSIVE METABOLIC PANEL
ALT: 26 IU/L (ref 0–44)
AST: 26 IU/L (ref 0–40)
Albumin/Globulin Ratio: 1.5 (ref 1.2–2.2)
Albumin: 4.3 g/dL (ref 3.8–4.9)
Alkaline Phosphatase: 86 IU/L (ref 44–121)
BUN/Creatinine Ratio: 12 (ref 9–20)
BUN: 14 mg/dL (ref 6–24)
Bilirubin Total: 0.3 mg/dL (ref 0.0–1.2)
CO2: 24 mmol/L (ref 20–29)
Calcium: 9.7 mg/dL (ref 8.7–10.2)
Chloride: 103 mmol/L (ref 96–106)
Creatinine, Ser: 1.17 mg/dL (ref 0.76–1.27)
Globulin, Total: 2.9 g/dL (ref 1.5–4.5)
Glucose: 104 mg/dL — ABNORMAL HIGH (ref 65–99)
Potassium: 4.3 mmol/L (ref 3.5–5.2)
Sodium: 141 mmol/L (ref 134–144)
Total Protein: 7.2 g/dL (ref 6.0–8.5)
eGFR: 75 mL/min/{1.73_m2} (ref >59)

## 2020-08-06 LAB — HEMOGLOBIN A1C
Est. average glucose Bld gHb Est-mCnc: 123 mg/dL
Hgb A1c MFr Bld: 5.9 % — ABNORMAL HIGH (ref 4.8–5.6)

## 2020-08-06 LAB — LIPID PANEL
Chol/HDL Ratio: 5.5 ratio — ABNORMAL HIGH (ref 0.0–5.0)
Cholesterol, Total: 194 mg/dL (ref 100–199)
HDL: 35 mg/dL — ABNORMAL LOW (ref >39)
LDL Chol Calc (NIH): 140 mg/dL — ABNORMAL HIGH (ref 0–99)
Triglycerides: 101 mg/dL (ref 0–149)
VLDL Cholesterol Cal: 19 mg/dL (ref 5–40)

## 2020-08-06 LAB — VITAMIN D 25 HYDROXY (VIT D DEFICIENCY, FRACTURES): Vit D, 25-Hydroxy: 24.4 ng/mL — ABNORMAL LOW (ref 30.0–100.0)

## 2020-08-06 LAB — VITAMIN B12: Vitamin B-12: 597 pg/mL (ref 232–1245)

## 2020-08-06 LAB — PSA: Prostate Specific Ag, Serum: 0.4 ng/mL (ref 0.0–4.0)

## 2020-08-06 LAB — TSH: TSH: 0.764 u[IU]/mL (ref 0.450–4.500)

## 2020-08-07 ENCOUNTER — Telehealth: Payer: Self-pay

## 2020-08-07 NOTE — Telephone Encounter (Signed)
Informed patient of results and recommendations. 

## 2020-08-07 NOTE — Telephone Encounter (Signed)
-----   Message from Kallie Locks, FNP sent at 08/06/2020  8:58 AM EDT ----- Cholesterol levels are mildly elevated. Continue low-fat, low cholesterol diet, increase fluids, increase fruits and vegetables. He will continue to decrease high sodium intake, excessive alcohol intake, increase potassium intake, smoking cessation, and increase physical activity of at least 30 minutes of cardio activity daily. He will continue to follow Heart Healthy or DASH diet.  Vitamin D level is mildly decreased. Advised to take daily otc MVI with vitamin d for me over 50 yrs. He should include foods that are high in Vitamin D. These include: Salmon, Cod Liver Oil, Mushrooms, Canned Fish, Milk, and Egg Yolks.   Hgb A1c is mildly increased. To avoid Diabetes, He will continue medication as prescribed, he will decrease foods/beverages high in sugars and carbs and follow Heart Healthy or DASH diet. Increase physical activity to at least 30 minutes cardio exercise daily.   All other labs are stable. Keep follow up appointment.

## 2020-10-22 ENCOUNTER — Other Ambulatory Visit: Payer: Self-pay

## 2020-10-22 MED FILL — Pravastatin Sodium Tab 80 MG: ORAL | 30 days supply | Qty: 30 | Fill #0 | Status: AC

## 2020-10-22 MED FILL — Ibuprofen Tab 800 MG: ORAL | 20 days supply | Qty: 60 | Fill #0 | Status: CN

## 2020-10-22 MED FILL — Losartan Potassium Tab 50 MG: ORAL | 30 days supply | Qty: 30 | Fill #0 | Status: AC

## 2020-10-22 MED FILL — Amlodipine Besylate Tab 10 MG (Base Equivalent): ORAL | 30 days supply | Qty: 30 | Fill #0 | Status: AC

## 2020-10-22 MED FILL — Sildenafil Citrate Tab 100 MG: ORAL | 30 days supply | Qty: 30 | Fill #0 | Status: AC

## 2020-10-23 ENCOUNTER — Other Ambulatory Visit: Payer: Self-pay

## 2020-11-03 ENCOUNTER — Other Ambulatory Visit: Payer: Self-pay

## 2020-11-03 ENCOUNTER — Emergency Department (HOSPITAL_BASED_OUTPATIENT_CLINIC_OR_DEPARTMENT_OTHER)
Admission: EM | Admit: 2020-11-03 | Discharge: 2020-11-03 | Disposition: A | Discharge status: Home / Self Care | Payer: Self-pay | Attending: Emergency Medicine | Admitting: Emergency Medicine

## 2020-11-03 ENCOUNTER — Encounter (HOSPITAL_BASED_OUTPATIENT_CLINIC_OR_DEPARTMENT_OTHER): Payer: Self-pay | Admitting: *Deleted

## 2020-11-03 DIAGNOSIS — J029 Acute pharyngitis, unspecified: Secondary | ICD-10-CM | POA: Insufficient documentation

## 2020-11-03 DIAGNOSIS — I10 Essential (primary) hypertension: Secondary | ICD-10-CM | POA: Insufficient documentation

## 2020-11-03 DIAGNOSIS — Z79899 Other long term (current) drug therapy: Secondary | ICD-10-CM | POA: Insufficient documentation

## 2020-11-03 DIAGNOSIS — R0989 Other specified symptoms and signs involving the circulatory and respiratory systems: Secondary | ICD-10-CM

## 2020-11-03 NOTE — ED Provider Notes (Signed)
[facility] EMERGENCY DEPARTMENT Provider Note   CSN: [id number] Arrival date & time: 11/03/20  1723     History Chief Complaint  Patient presents with   Sore Throat    Michael Fleming is a 50 y.o. male.  HPI 50 year old male with a history of GERD, hyperlipidemia, hypertension, seizures presents to the ER with complaints of foreign body sensation and throat irritation which began yesterday.  Patient states that he was at [employer] and was sipping on some water with ice, felt a foreign object enter his throat which was originally difficult to swallow.  He was not sure what this object was.  He states he has had throat irritation, sensation where he needs to clear his throat since then.  He denies any difficulty swallowing, breathing, holding down fluids or food.  He has not taken anything for his symptoms.    Past Medical History:  Diagnosis Date   Allergy    GERD (gastroesophageal reflux disease)    Heart palpitations 12/2018   Hyperlipidemia    Hypertension    Hypokalemia 12/2018   Seizures (HCC)    last seizure age 46 due to blood clot- 1981 none since     Patient Active Problem List   Diagnosis Date Noted   Hyperlipidemia 02/05/2019   Hypokalemia 02/05/2019   Vitamin D deficiency 02/05/2019   Tick bite of groin 11/27/2018   Back pain 08/06/2018   Blurry vision 08/06/2018   Pain in right wrist 03/27/2017   Chronic pain of left knee 03/27/2017   HTN (hypertension) 10/23/2016   History of brain surgery 10/23/2016   Chronic headache 10/23/2016    Past Surgical History:  Procedure Laterality Date   BRAIN SURGERY     1981 blot clot removed   FOOT SURGERY     right bunion removed       Family History  Problem Relation Age of Onset   Healthy Mother    Hypertension Father    Colon cancer Neg Hx    Colon polyps Neg Hx    Esophageal cancer Neg Hx    Rectal cancer Neg Hx    Stomach cancer Neg Hx     Social History   Tobacco Use   Smoking status:  Never   Smokeless tobacco: Never  Vaping Use   Vaping Use: Never used  Substance Use Topics   Alcohol use: Yes    Comment: occasional   Drug use: No    Home Medications Prior to Admission medications   Medication Sig Start Date End Date Taking? Authorizing Provider  amLODipine (NORVASC) 10 MG tablet TAKE 1 TABLET (10 MG TOTAL) BY MOUTH DAILY. 08/05/20 08/05/21  Kallie Locks, FNP  ibuprofen (ADVIL) 800 MG tablet TAKE 1 TABLET (800 MG TOTAL) BY MOUTH EVERY 8 (EIGHT) HOURS AS NEEDED. 08/05/20 08/05/21  Kallie Locks, FNP  losartan (COZAAR) 50 MG tablet TAKE 1 TABLET (50 MG TOTAL) BY MOUTH DAILY. 08/05/20 08/05/21  Kallie Locks, FNP  Na Sulfate-K Sulfate-Mg Sulf 17.5-3.13-1.6 GM/177ML SOLN USE AS DIRECTED 04/13/20 04/13/21  Armbruster, Willaim Rayas, MD  pravastatin (PRAVACHOL) 80 MG tablet TAKE 1 TABLET (80 MG TOTAL) BY MOUTH DAILY. 08/05/20 08/05/21  Kallie Locks, FNP  sildenafil (VIAGRA) 100 MG tablet TAKE 0.5-1 TABLETS (50-100 MG TOTAL) BY MOUTH DAILY AS NEEDED FOR ERECTILE DYSFUNCTION. 08/05/20 08/05/21  Kallie Locks, FNP    Allergies    Patient has no known allergies.  Review of Systems   Review of Systems  Constitutional:  Negative for fever.  HENT:  Negative for trouble swallowing and voice change.   Respiratory:  Negative for shortness of breath.    Physical Exam Updated Vital Signs BP (!) 141/84 (BP Location: Right Arm)   Pulse 91   Temp 100.2 F (37.9 C)   Resp 20   Ht 5\' 7"  (1.702 m)   Wt 97.5 kg   SpO2 95%   BMI 33.67 kg/m   Physical Exam Vitals reviewed.  Constitutional:      Appearance: Normal appearance.  HENT:     Head: Normocephalic and atraumatic.     Mouth/Throat:     Mouth: Mucous membranes are moist. Mucous membranes are pale. No oral lesions.     Pharynx: No pharyngeal swelling, oropharyngeal exudate, posterior oropharyngeal erythema or uvula swelling.     Comments: Oropharynx non erythematous without exudates, uvula midline, no  unilateral tonsillar swelling, tongue normal size and midline, no sublingual/submandibular swellimg, tolerating secretions well. No visible foreign bodies    Eyes:     General:        Right eye: No discharge.        Left eye: No discharge.     Extraocular Movements: Extraocular movements intact.     Conjunctiva/sclera: Conjunctivae normal.  Musculoskeletal:        General: No swelling. Normal range of motion.  Neurological:     General: No focal deficit present.     Mental Status: He is alert and oriented to person, place, and time.  Psychiatric:        Mood and Affect: Mood normal.        Behavior: Behavior normal.    ED Results / Procedures / Treatments   Labs (all labs ordered are listed, but only abnormal results are displayed) Labs Reviewed - No data to display  EKG None  Radiology No results found.  Procedures Procedures   Medications Ordered in ED Medications - No data to display  ED Course  I have reviewed the triage vital signs and the nursing notes.  Pertinent labs & imaging results that were available during my care of the patient were reviewed by me and considered in my medical decision making (see chart for details).    MDM Rules/Calculators/A&P                          50 year old male with foreign body sensation is throat as of yesterday.  On arrival, he is well-appearing, no acute distress, resting comfortably in the ER bed.  Physical exam overall reassuring, no visible foreign bodies on oral exam.  Uvula is midline.  I did discuss with the patient that given unknown object, the chances of 42 seeing something with x-ray is low especially if it is a piece of food.  He denies any difficulty swallowing, drinking, eating.  No respiratory distress.  We discussed pros versus cons of x-ray and the patient has opted to forego x-ray today.  I did encourage salt water gargles and PPI given he has a history of GERD.  Patient does have a PCP and will follow-up in the  next few days if his symptoms do not resolve.  We discussed return precautions.  He voiced understanding and is agreeable.  Stable for discharge. Final Clinical Impression(s) / ED Diagnoses Final diagnoses:  Foreign body sensation in throat    Rx / DC Orders ED Discharge Orders     None  Mare Ferrari, PA-C 11/03/20 1800    Koleen Distance, MD 11/03/20 1806

## 2020-11-03 NOTE — ED Triage Notes (Signed)
States he was drinking something from a fast food restaurant yesterday and he swallowed something from the cup that made him cough. He woke with a sore throat.

## 2020-11-03 NOTE — Discharge Instructions (Addendum)
You were evaluated in the Emergency Department and after careful evaluation, we did not find any emergent condition requiring admission or further testing in the hospital.  As discussed, the likelihood of seeing something in your throat on x-ray is low given this is likely not a metal object.  It is likely that you have swallowed what ever that object is and have some throat irritation.  You may some salt water gargles, you may also try over-the-counter medicines for reflux as well.  Please follow-up with your primary care doctor if your symptoms do not resolve.  Return to the ER for any new or worsening symptoms.  Thank you for allowing [place] to be a part of your care.

## 2020-12-29 ENCOUNTER — Other Ambulatory Visit: Payer: Self-pay

## 2020-12-29 MED FILL — Amlodipine Besylate Tab 10 MG (Base Equivalent): ORAL | 30 days supply | Qty: 30 | Fill #1 | Status: AC

## 2020-12-29 MED FILL — Losartan Potassium Tab 50 MG: ORAL | 30 days supply | Qty: 30 | Fill #1 | Status: AC

## 2020-12-29 MED FILL — Pravastatin Sodium Tab 80 MG: ORAL | 30 days supply | Qty: 30 | Fill #1 | Status: AC

## 2021-02-10 ENCOUNTER — Ambulatory Visit (INDEPENDENT_AMBULATORY_CARE_PROVIDER_SITE_OTHER): Payer: Self-pay | Admitting: Nurse Practitioner

## 2021-02-10 ENCOUNTER — Encounter: Payer: Self-pay | Admitting: Nurse Practitioner

## 2021-02-10 ENCOUNTER — Other Ambulatory Visit: Payer: Self-pay

## 2021-02-10 ENCOUNTER — Ambulatory Visit (HOSPITAL_COMMUNITY)
Admission: RE | Admit: 2021-02-10 | Discharge: 2021-02-10 | Disposition: A | Discharge status: Home / Self Care | Payer: Self-pay | Source: Ambulatory Visit | Attending: Nurse Practitioner | Admitting: Nurse Practitioner

## 2021-02-10 VITALS — BP 143/87 | HR 87 | Temp 98.1°F | Ht 67.0 in | Wt 211.0 lb

## 2021-02-10 DIAGNOSIS — M25511 Pain in right shoulder: Secondary | ICD-10-CM

## 2021-02-10 DIAGNOSIS — I10 Essential (primary) hypertension: Secondary | ICD-10-CM

## 2021-02-10 DIAGNOSIS — E785 Hyperlipidemia, unspecified: Secondary | ICD-10-CM

## 2021-02-10 DIAGNOSIS — Z0000 Encounter for general adult medical examination without abnormal findings: Secondary | ICD-10-CM

## 2021-02-10 LAB — POCT GLYCOSYLATED HEMOGLOBIN (HGB A1C): Hemoglobin A1C: 5.6 % (ref 4.0–5.6)

## 2021-02-10 IMAGING — DX DG SHOULDER 2+V*R*
3 series · 3 of 3 positions shown · non-contrast
Comparison: None.

CLINICAL DATA: Popping sensation in the right shoulder with
subsequent pain, initial encounter

EXAM:
RIGHT SHOULDER - 2+ VIEW

[shoulder grashey]
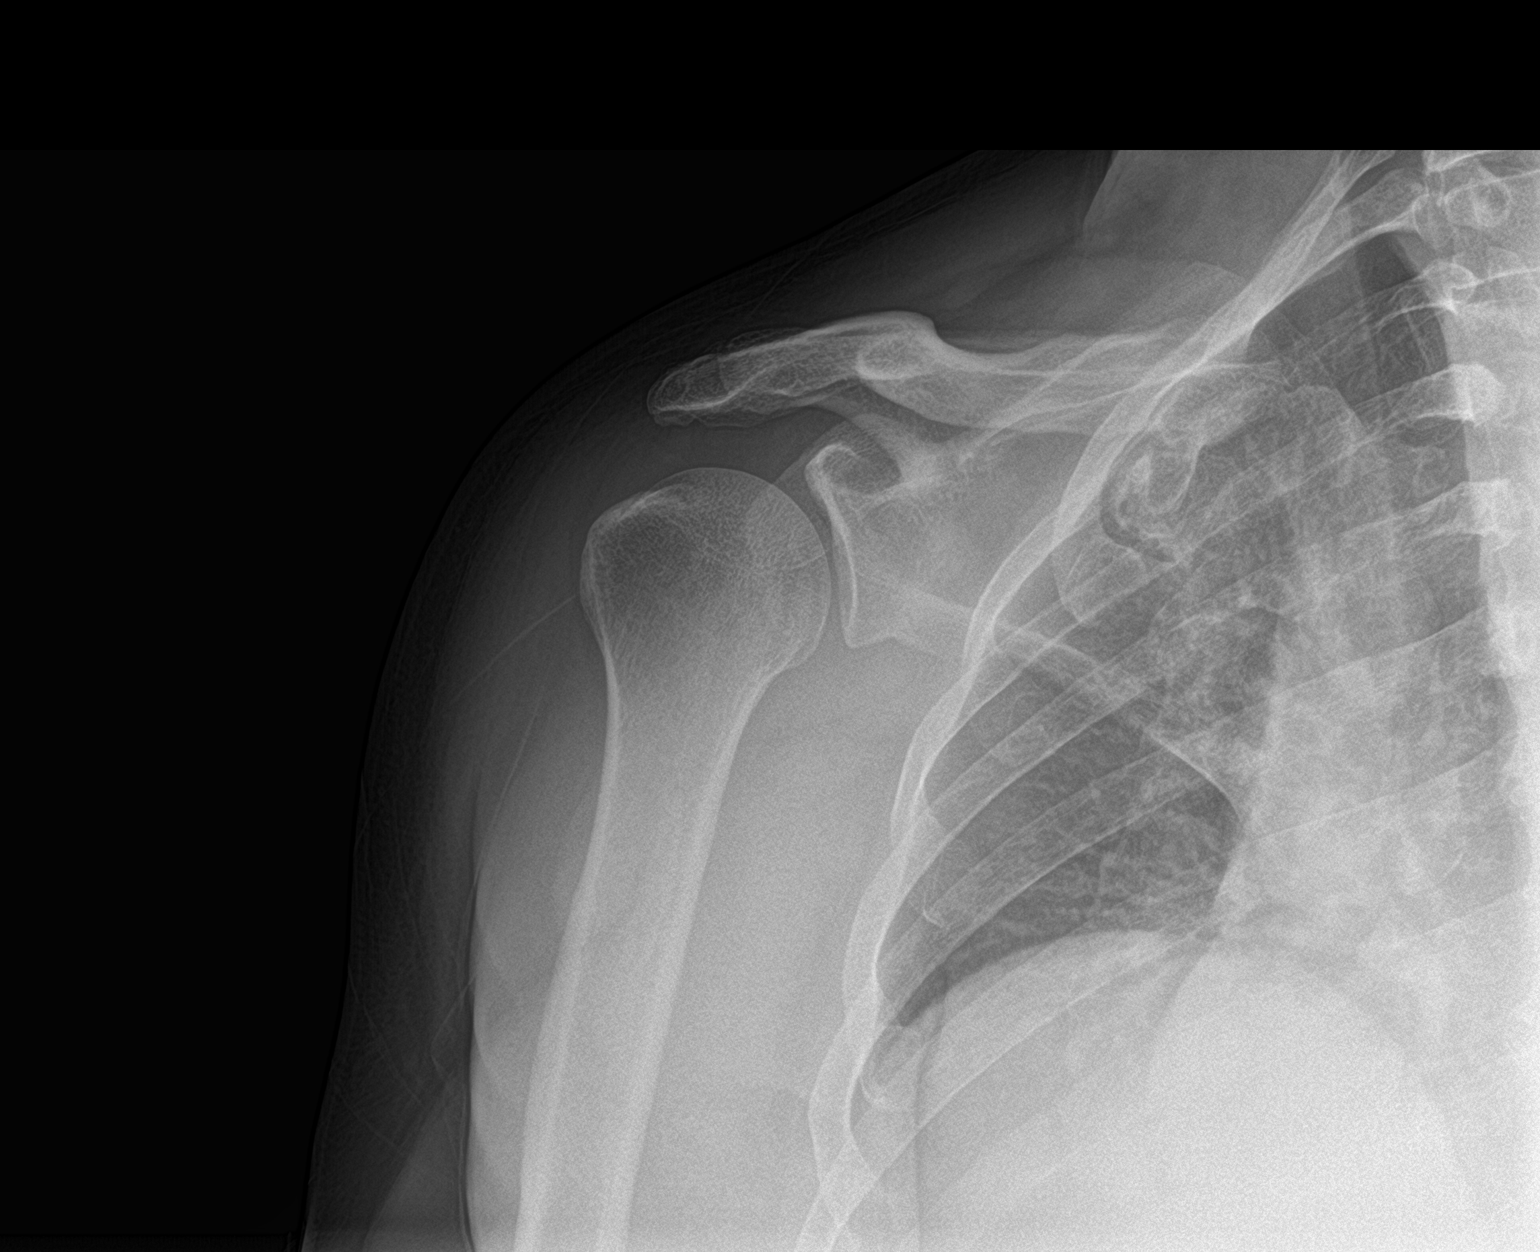

[shoulder y view]
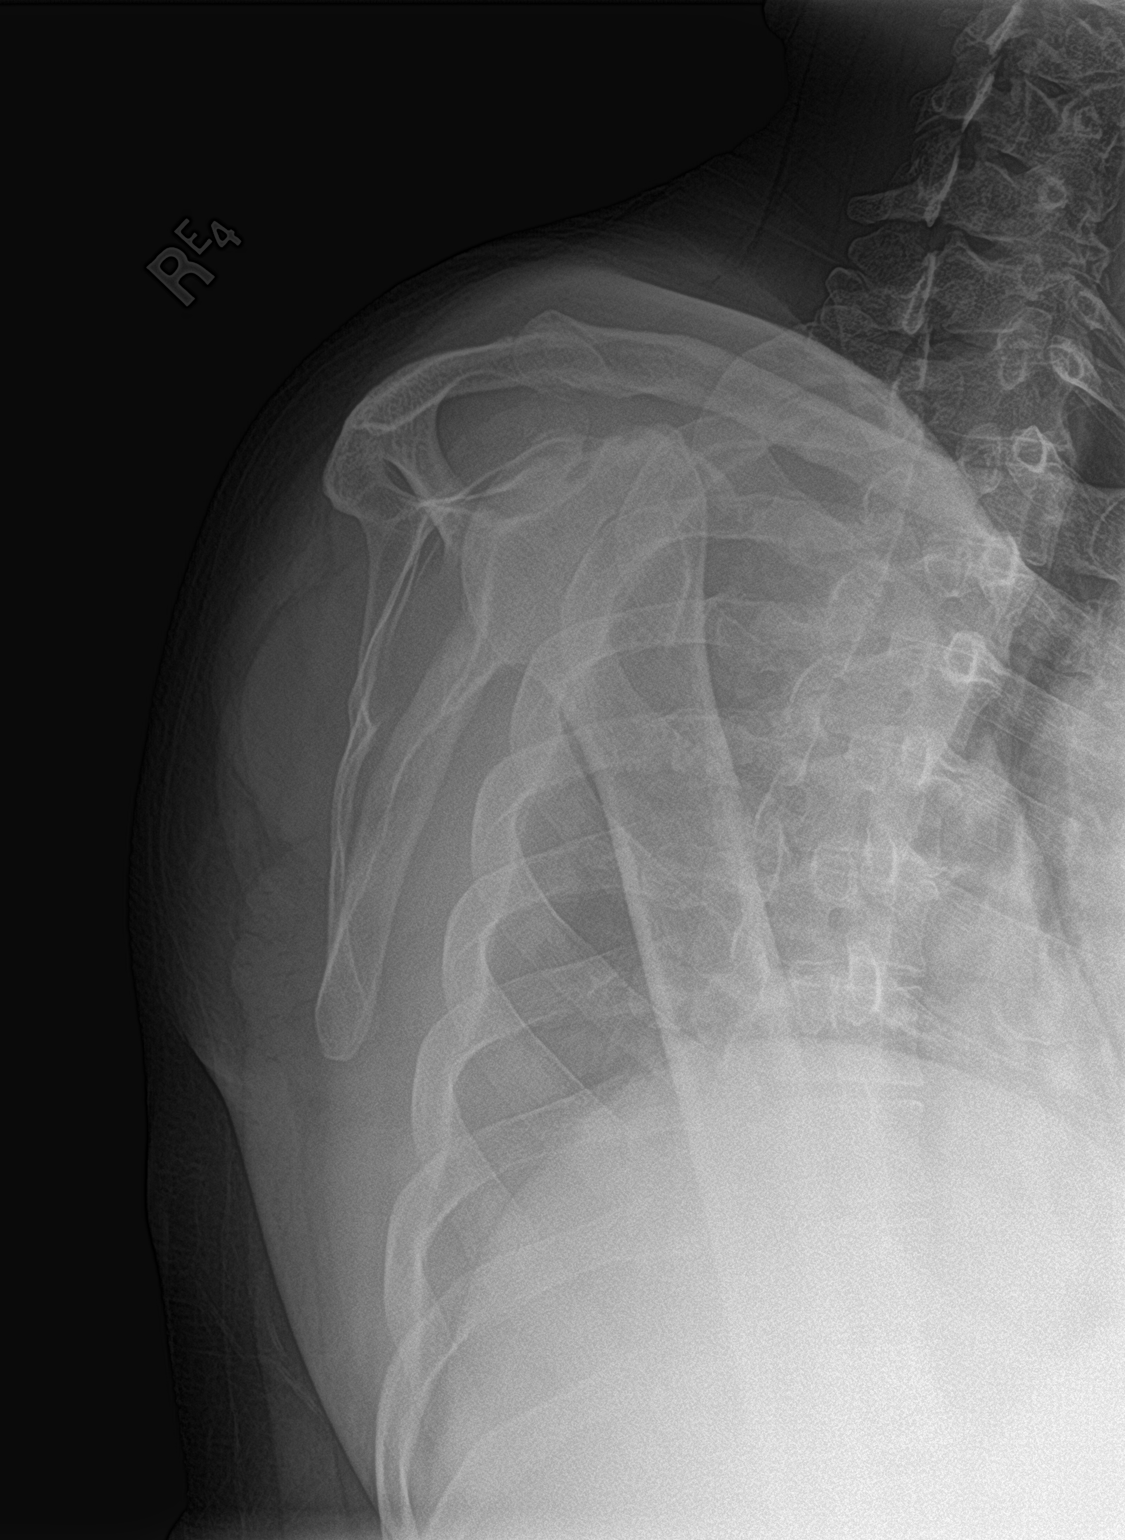

[shoulder axillary]
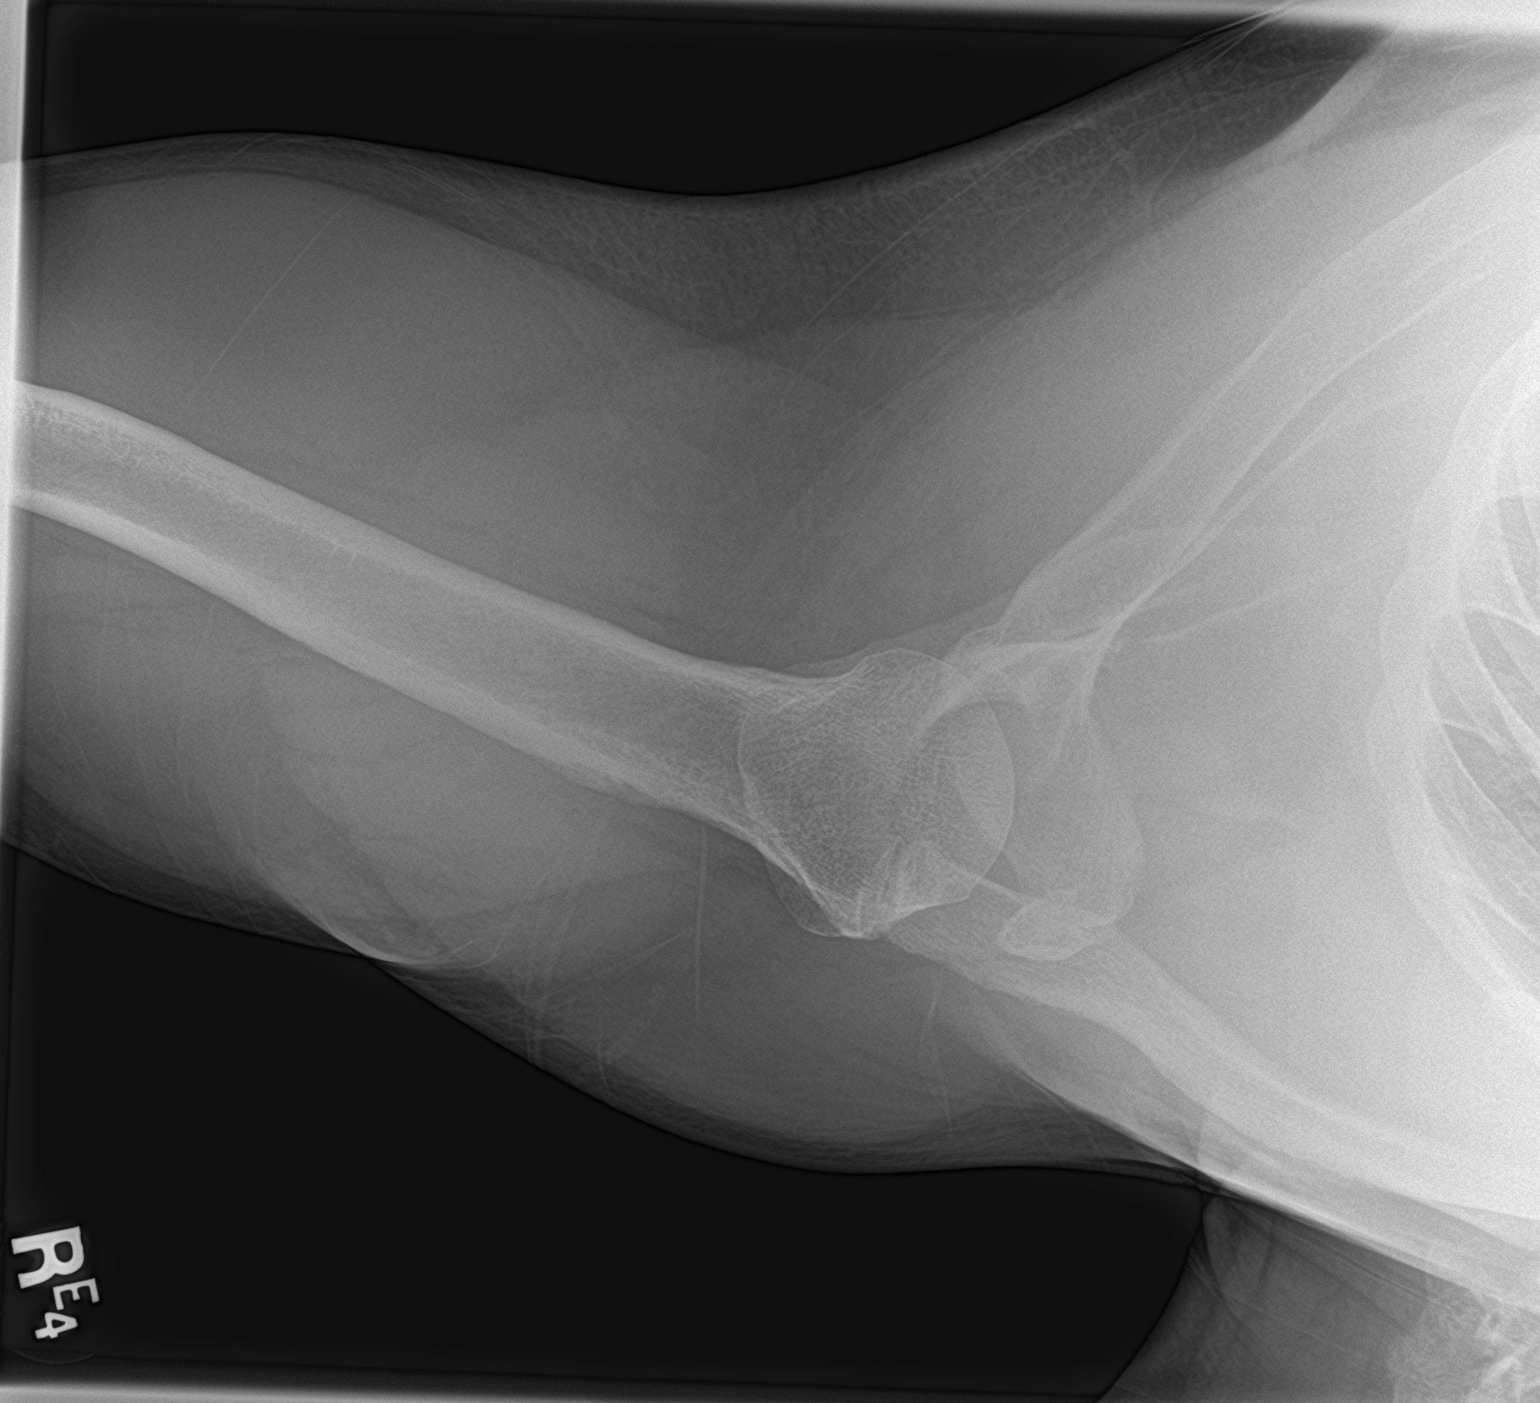

[3 of 3 positions shown; findings below may reference images not displayed]

FINDINGS: No acute fracture or dislocation is noted. Underlying bony thorax is
within normal limits. No soft tissue abnormality is seen.
IMPRESSION: No acute abnormality noted.

## 2021-02-10 MED ORDER — LIDOCAINE 5 % EX PTCH
1.0000 | MEDICATED_PATCH | Freq: Every day | CUTANEOUS | 0 refills | Status: AC | PRN
  Filled 2021-02-10: qty 14, 14d supply, fill #0

## 2021-02-10 MED ORDER — METHYLPREDNISOLONE SODIUM SUCC 40 MG IJ SOLR
40.0000 mg | Freq: Once | INTRAMUSCULAR | Status: AC
  Administered 2021-02-10: 40 mg via INTRAMUSCULAR

## 2021-02-10 MED ORDER — NAPROXEN 500 MG PO TABS
500.0000 mg | ORAL_TABLET | Freq: Three times a day (TID) | ORAL | 0 refills | Status: DC | PRN
  Filled 2021-02-10: qty 15, 5d supply, fill #0

## 2021-02-10 NOTE — Progress Notes (Signed)
[facility] [place], [place]  [address] Phone:  [phone]   Fax:  [phone] Subjective:   Patient ID: Michael Fleming, male    DOB: Jun 28, 1970, 50 y.o.   MRN: [id number]  Chief Complaint  Patient presents with   Follow-up    Right shoulder pain, started this morning. Did not take anything for pain,.    HPI Michael Fleming 50 y.o. male with history of GERD, hyperlipidemia, hypertension and hypokalemia to the [facility] for right shoulder pain. Patient states that he has had right shoulder pain since this morning. Currently rates pain 10/10 and describes as throbbing and aching. As not taken any medications for symptoms. States that he was changing his brakes yesterday, when injury occurred. Pain increases with movement, denies any improving factors. Denies any numbness or tingling. Currently works as a Education officer, community. States that he is right handed.  Denies any chest pain or shortness of breath. Denies any HA, dizziness or blurred vision. States that he is currently compliant with all medications.   Past Medical History:  Diagnosis Date   Allergy    GERD (gastroesophageal reflux disease)    Heart palpitations 12/2018   Hyperlipidemia    Hypertension    Hypokalemia 12/2018   Seizures ([place])    last seizure age 60 due to blood clot- 1981 none since     Past Surgical History:  Procedure Laterality Date   BRAIN SURGERY     1981 blot clot removed   FOOT SURGERY     right bunion removed    Family History  Problem Relation Age of Onset   Healthy Mother    Hypertension Father    Colon cancer Neg Hx    Colon polyps Neg Hx    Esophageal cancer Neg Hx    Rectal cancer Neg Hx    Stomach cancer Neg Hx     Social History   Socioeconomic History   Marital status: Single    Spouse name: Not on file   Number of children: Not on file   Years of education: Not on file   Highest education level: Not on file  Occupational History   Not on file   Tobacco Use   Smoking status: Never   Smokeless tobacco: Never  Vaping Use   Vaping Use: Never used  Substance and Sexual Activity   Alcohol use: Yes    Comment: occasional   Drug use: No   Sexual activity: Not Currently  Other Topics Concern   Not on file  Social History Narrative   Not on file   Social Determinants of Health   Financial Resource Strain: Not on file  Food Insecurity: Not on file  Transportation Needs: Not on file  Physical Activity: Not on file  Stress: Not on file  Social Connections: Not on file  Intimate Partner Violence: Not on file    Outpatient Medications Prior to Visit  Medication Sig Dispense Refill   amLODipine (NORVASC) 10 MG tablet TAKE 1 TABLET (10 MG TOTAL) BY MOUTH DAILY. 30 tablet 11   losartan (COZAAR) 50 MG tablet TAKE 1 TABLET (50 MG TOTAL) BY MOUTH DAILY. 30 tablet 11   Na Sulfate-K Sulfate-Mg Sulf 17.5-3.13-1.6 GM/177ML SOLN USE AS DIRECTED 354 mL 0   pravastatin (PRAVACHOL) 80 MG tablet TAKE 1 TABLET (80 MG TOTAL) BY MOUTH DAILY. 30 tablet 11   sildenafil (VIAGRA) 100 MG tablet TAKE 0.5-1 TABLETS (50-100 MG TOTAL) BY MOUTH DAILY AS  NEEDED FOR ERECTILE DYSFUNCTION. 30 tablet 3   ibuprofen (ADVIL) 800 MG tablet TAKE 1 TABLET (800 MG TOTAL) BY MOUTH EVERY 8 (EIGHT) HOURS AS NEEDED. 60 tablet 11   No facility-administered medications prior to visit.    No Known Allergies  Review of Systems  Constitutional:  Negative for chills, fever and malaise/fatigue.  HENT: Negative.    Eyes: Negative.   Respiratory:  Negative for cough and shortness of breath.   Cardiovascular:  Negative for chest pain, palpitations and leg swelling.  Gastrointestinal:  Negative for abdominal pain, blood in stool, constipation, diarrhea, nausea and vomiting.  Genitourinary: Negative.   Musculoskeletal:  Positive for joint pain.  Skin: Negative.   Neurological: Negative.   Psychiatric/Behavioral:  Negative for depression. The patient is not nervous/anxious.    All other systems reviewed and are negative.     Objective:    Physical Exam Vitals reviewed.  Constitutional:      General: He is not in acute distress.    Appearance: Normal appearance.  HENT:     Head: Normocephalic.  Cardiovascular:     Rate and Rhythm: Normal rate and regular rhythm.     Pulses: Normal pulses.     Heart sounds: Normal heart sounds.     Comments: No obvious peripheral edema Pulmonary:     Effort: Pulmonary effort is normal.     Breath sounds: Normal breath sounds.  Musculoskeletal:        General: No swelling, tenderness or deformity.     Cervical back: Normal range of motion.     Comments: Limited ROM of right shoulder due to pain   Skin:    General: Skin is warm and dry.     Capillary Refill: Capillary refill takes less than 2 seconds.  Neurological:     General: No focal deficit present.     Mental Status: He is alert and oriented to person, place, and time.  Psychiatric:        Mood and Affect: Mood normal.        Behavior: Behavior normal.        Thought Content: Thought content normal.        Judgment: Judgment normal.    BP (!) 143/87 (BP Location: Left Arm, Patient Position: Sitting)   Pulse 87   Temp 98.1 F (36.7 C)   Ht 5' 7"  (1.702 m)   Wt 211 lb 0.2 oz (95.7 kg)   SpO2 99%   BMI 33.05 kg/m  Wt Readings from Last 3 Encounters:  02/10/21 211 lb 0.2 oz (95.7 kg)  11/03/20 215 lb (97.5 kg)  08/05/20 217 lb (98.4 kg)    Immunization History  Administered Date(s) Administered   Influenza,inj,Quad PF,6+ Mos 01/20/2017   PFIZER(Purple Top)SARS-COV-2 Vaccination 10/29/2019, 01/07/2020   Tdap 09/29/2016    Diabetic Foot Exam - Simple   No data filed     Lab Results  Component Value Date   TSH 0.764 08/05/2020   Lab Results  Component Value Date   WBC 4.7 08/05/2020   HGB 13.8 08/05/2020   HCT 41.8 08/05/2020   MCV 86 08/05/2020   PLT 291 08/05/2020   Lab Results  Component Value Date   NA 141 08/05/2020   K 4.3  08/05/2020   CO2 24 08/05/2020   GLUCOSE 104 (H) 08/05/2020   BUN 14 08/05/2020   CREATININE 1.17 08/05/2020   BILITOT 0.3 08/05/2020   ALKPHOS 86 08/05/2020   AST 26 08/05/2020   ALT 26  08/05/2020   PROT 7.2 08/05/2020   ALBUMIN 4.3 08/05/2020   CALCIUM 9.7 08/05/2020   ANIONGAP 10 01/08/2019   EGFR 75 08/05/2020   Lab Results  Component Value Date   CHOL 194 08/05/2020   CHOL 181 08/05/2019   CHOL 224 (H) 08/06/2018   Lab Results  Component Value Date   HDL 35 (L) 08/05/2020   HDL 35 (L) 08/05/2019   HDL 39 (L) 08/06/2018   Lab Results  Component Value Date   LDLCALC 140 (H) 08/05/2020   LDLCALC 125 (H) 08/05/2019   LDLCALC 165 (H) 08/06/2018   Lab Results  Component Value Date   TRIG 101 08/05/2020   TRIG 113 08/05/2019   TRIG 101 08/06/2018   Lab Results  Component Value Date   CHOLHDL 5.5 (H) 08/05/2020   CHOLHDL 5.2 (H) 08/05/2019   CHOLHDL 5.7 (H) 08/06/2018   Lab Results  Component Value Date   HGBA1C 5.6 02/10/2021   HGBA1C 5.9 (H) 08/05/2020   HGBA1C 5.5 08/05/2019       Assessment & Plan:   Problem List Items Addressed This Visit       Other   Hyperlipidemia   Relevant Orders   CBC with Differential/Platelet   Comprehensive metabolic panel   Lipid panel   POCT glycosylated hemoglobin (Hb A1C) (Completed) Encouraged continued diet and exercise efforts  Encouraged continued compliance with medication     Other Visit Diagnoses     Essential hypertension    -  Primary   Relevant Orders   CBC with Differential/Platelet   Comprehensive metabolic panel   Lipid panel   POCT glycosylated hemoglobin (Hb A1C) (Completed) Encouraged continued diet and exercise efforts  Encouraged continued compliance with medication     Annual physical exam       Relevant Orders   CBC with Differential/Platelet   Comprehensive metabolic panel   Lipid panel   POCT glycosylated hemoglobin (Hb A1C) (Completed)   Acute pain of right shoulder        Relevant Medications   methylPREDNISolone sodium succinate (SOLU-MEDROL) 40 mg/mL injection 40 mg (Completed)   naproxen (NAPROSYN) 500 MG tablet   lidocaine (LIDODERM) 5 % Discussed ROM exercises to be completed at home Discussed RICE with limited shoulder rest  Tylenol can be taken as needed, in addition to prescribed mediations    Other Relevant Orders   DG Shoulder Right   Follow up in 6 mths for reevaluation of chronic illness, sooner as needed.    I have discontinued Stephano L. Saputo's ibuprofen. I am also having him start on naproxen and lidocaine. Additionally, I am having him maintain his sildenafil, pravastatin, losartan, amLODipine, and Na Sulfate-K Sulfate-Mg Sulf. We administered methylPREDNISolone sodium succinate.  Meds ordered this encounter  Medications   methylPREDNISolone sodium succinate (SOLU-MEDROL) 40 mg/mL injection 40 mg   naproxen (NAPROSYN) 500 MG tablet    Sig: Take 1 tablet (500 mg total) by mouth 3 (three) times daily as needed for moderate pain.    Dispense:  15 tablet    Refill:  0   lidocaine (LIDODERM) 5 %    Sig: Place 1 patch onto the skin daily as needed for up to 14 days. Remove & Discard patch within 12 hours or as directed by MD    Dispense:  14 patch    Refill:  0     Teena Dunk, NP

## 2021-02-10 NOTE — Patient Instructions (Signed)
You were seen today in the [facility] for evaluation of chronic illness and right shoulder pain . Labs were collected, results will be available via MyChart or, if abnormal, you will be contacted by clinic staff. You were prescribed medications, please take as directed. Please follow up in 6 mths for reevaluation of chronic illness.

## 2021-02-11 ENCOUNTER — Other Ambulatory Visit: Payer: Self-pay

## 2021-02-11 LAB — CBC WITH DIFFERENTIAL/PLATELET
Basophils Absolute: 0.1 10*3/uL (ref 0.0–0.2)
Basos: 1 %
EOS (ABSOLUTE): 0.1 10*3/uL (ref 0.0–0.4)
Eos: 1 %
Hematocrit: 40.1 % (ref 37.5–51.0)
Hemoglobin: 13.5 g/dL (ref 13.0–17.7)
Immature Grans (Abs): 0 10*3/uL (ref 0.0–0.1)
Immature Granulocytes: 0 %
Lymphocytes Absolute: 2.9 10*3/uL (ref 0.7–3.1)
Lymphs: 41 %
MCH: 28.4 pg (ref 26.6–33.0)
MCHC: 33.7 g/dL (ref 31.5–35.7)
MCV: 84 fL (ref 79–97)
Monocytes Absolute: 0.6 10*3/uL (ref 0.1–0.9)
Monocytes: 8 %
Neutrophils Absolute: 3.4 10*3/uL (ref 1.4–7.0)
Neutrophils: 49 %
Platelets: 298 10*3/uL (ref 150–450)
RBC: 4.76 x10E6/uL (ref 4.14–5.80)
RDW: 13 % (ref 11.6–15.4)
WBC: 7 10*3/uL (ref 3.4–10.8)

## 2021-02-11 LAB — COMPREHENSIVE METABOLIC PANEL
ALT: 28 IU/L (ref 0–44)
AST: 29 IU/L (ref 0–40)
Albumin/Globulin Ratio: 1.7 (ref 1.2–2.2)
Albumin: 4.8 g/dL (ref 3.8–4.9)
Alkaline Phosphatase: 90 IU/L (ref 44–121)
BUN/Creatinine Ratio: 11 (ref 9–20)
BUN: 14 mg/dL (ref 6–24)
Bilirubin Total: 0.5 mg/dL (ref 0.0–1.2)
CO2: 26 mmol/L (ref 20–29)
Calcium: 9.7 mg/dL (ref 8.7–10.2)
Chloride: 103 mmol/L (ref 96–106)
Creatinine, Ser: 1.23 mg/dL (ref 0.76–1.27)
Globulin, Total: 2.8 g/dL (ref 1.5–4.5)
Glucose: 96 mg/dL (ref 65–99)
Potassium: 3.9 mmol/L (ref 3.5–5.2)
Sodium: 143 mmol/L (ref 134–144)
Total Protein: 7.6 g/dL (ref 6.0–8.5)
eGFR: 71 mL/min/{1.73_m2} (ref >59)

## 2021-02-11 LAB — LIPID PANEL
Chol/HDL Ratio: 5 ratio (ref 0.0–5.0)
Cholesterol, Total: 219 mg/dL — ABNORMAL HIGH (ref 100–199)
HDL: 44 mg/dL (ref >39)
LDL Chol Calc (NIH): 155 mg/dL — ABNORMAL HIGH (ref 0–99)
Triglycerides: 110 mg/dL (ref 0–149)
VLDL Cholesterol Cal: 20 mg/dL (ref 5–40)

## 2021-02-12 ENCOUNTER — Other Ambulatory Visit: Payer: Self-pay | Admitting: Nurse Practitioner

## 2021-02-12 ENCOUNTER — Other Ambulatory Visit: Payer: Self-pay

## 2021-02-12 DIAGNOSIS — M25511 Pain in right shoulder: Secondary | ICD-10-CM

## 2021-02-12 DIAGNOSIS — S4991XA Unspecified injury of right shoulder and upper arm, initial encounter: Secondary | ICD-10-CM

## 2021-02-12 MED ORDER — CYCLOBENZAPRINE HCL 10 MG PO TABS
10.0000 mg | ORAL_TABLET | Freq: Three times a day (TID) | ORAL | 0 refills | Status: DC | PRN
  Filled 2021-02-12: qty 20, 7d supply, fill #0

## 2021-02-16 ENCOUNTER — Ambulatory Visit (INDEPENDENT_AMBULATORY_CARE_PROVIDER_SITE_OTHER): Payer: Self-pay | Admitting: Orthopaedic Surgery

## 2021-02-16 ENCOUNTER — Other Ambulatory Visit: Payer: Self-pay

## 2021-02-16 ENCOUNTER — Encounter: Payer: Self-pay | Admitting: Orthopaedic Surgery

## 2021-02-16 DIAGNOSIS — M25511 Pain in right shoulder: Secondary | ICD-10-CM

## 2021-02-16 NOTE — Progress Notes (Signed)
Office Visit Note   Patient: Michael Fleming           Date of Birth: March 07, 1971           MRN: [id number] Visit Date: 02/16/2021              Requested by: Orion Crook I, NP [address], [place],  [place] [address] PCP: Orion Crook I, NP   Assessment & Plan: Visit Diagnoses:  1. Acute pain of right shoulder     Plan: Impression is acute right rotator cuff tear status post acute injury last week.  We will order MRI to evaluate for this.  Follow-up after the MRI.  Follow-Up Instructions: No follow-ups on file.   Orders:  No orders of the defined types were placed in this encounter.  No orders of the defined types were placed in this encounter.     Procedures: No procedures performed   Clinical Data: No additional findings.   Subjective: Chief Complaint  Patient presents with   Right Shoulder - Pain    Michael Fleming is a very pleasant 50 year old gentleman right-hand-dominant who comes in for acute right shoulder pain since last Thursday.  He was changing brakes when he was trying a torque a tight not over the wheel when he felt something pop in his shoulder.  He immediately felt pain and it felt like something broke in his shoulder.  Denies any numbness and tingling.  Since then he has had significant pain and weakness to shoulder elevation.   Review of Systems  Constitutional: Negative.   All other systems reviewed and are negative.   Objective: Vital Signs: There were no vitals taken for this visit.  Physical Exam Vitals and nursing note reviewed.  Constitutional:      Appearance: He is well-developed.  Pulmonary:     Effort: Pulmonary effort is normal.  Abdominal:     Palpations: Abdomen is soft.  Skin:    General: Skin is warm.  Neurological:     Mental Status: He is alert and oriented to person, place, and time.  Psychiatric:        Behavior: Behavior normal.        Thought Content: Thought content normal.        Judgment: Judgment normal.     Ortho Exam  Right shoulder examination shows normal passive range of motion with mild pain.  His decreased strength and increased pain with empty can testing.  He has a slight shrug to compensate for abduction.  Specialty Comments:  No specialty comments available.  Imaging: No results found.   PMFS History: Patient Active Problem List   Diagnosis Date Noted   Hyperlipidemia 02/05/2019   Hypokalemia 02/05/2019   Vitamin D deficiency 02/05/2019   Tick bite of groin 11/27/2018   Back pain 08/06/2018   Blurry vision 08/06/2018   Pain in right wrist 03/27/2017   Chronic pain of left knee 03/27/2017   HTN (hypertension) 10/23/2016   History of brain surgery 10/23/2016   Chronic headache 10/23/2016   Past Medical History:  Diagnosis Date   Allergy    GERD (gastroesophageal reflux disease)    Heart palpitations 12/2018   Hyperlipidemia    Hypertension    Hypokalemia 12/2018   Seizures ([facility])    last seizure age 24 due to blood clot- 1981 none since     Family History  Problem Relation Age of Onset   Healthy Mother    Hypertension Father  Colon cancer Neg Hx    Colon polyps Neg Hx    Esophageal cancer Neg Hx    Rectal cancer Neg Hx    Stomach cancer Neg Hx     Past Surgical History:  Procedure Laterality Date   BRAIN SURGERY     1981 blot clot removed   FOOT SURGERY     right bunion removed   Social History   Occupational History   Not on file  Tobacco Use   Smoking status: Never   Smokeless tobacco: Never  Vaping Use   Vaping Use: Never used  Substance and Sexual Activity   Alcohol use: Yes    Comment: occasional   Drug use: No   Sexual activity: Not Currently

## 2021-03-01 ENCOUNTER — Other Ambulatory Visit: Payer: Self-pay

## 2021-03-01 ENCOUNTER — Ambulatory Visit
Admission: RE | Admit: 2021-03-01 | Discharge: 2021-03-01 | Disposition: A | Discharge status: Home / Self Care | Payer: Self-pay | Source: Ambulatory Visit | Attending: Orthopaedic Surgery | Admitting: Orthopaedic Surgery

## 2021-03-01 DIAGNOSIS — M25511 Pain in right shoulder: Secondary | ICD-10-CM

## 2021-03-01 IMAGING — MR MR SHOULDER*R* W/O CM
4 of 5 series · 21 of 40 positions shown · non-contrast
Comparison: X-ray shoulder [DATE].

CLINICAL DATA: Patient complains of right shoulder pain. Injury 2
weeks ago.

EXAM:
MRI OF THE RIGHT SHOULDER WITHOUT CONTRAST
TECHNIQUE: Multiplanar, multisequence MR imaging of the shoulder was performed.
No intravenous contrast was administered.

[Series 6: T2 fat-sat · axial · right · 3.0mm · 0.47mm/px · z∈[-58,+53]mm · 8 of 31 slices shown (1 of 3)]
[im 1/31]
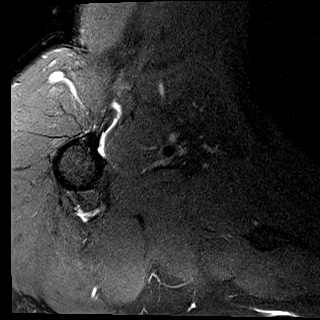
[im 4/31]
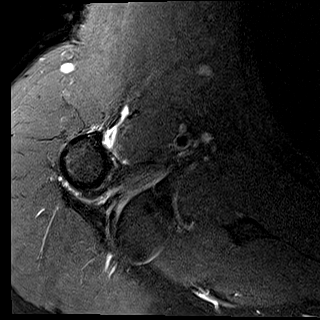
[im 11/31]
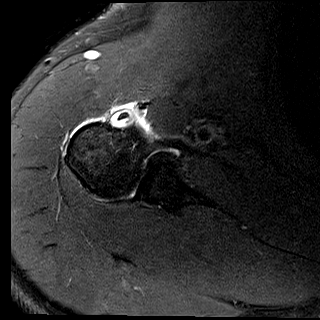
[im 14/31]
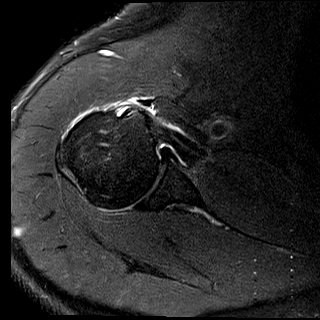
[im 17/31]
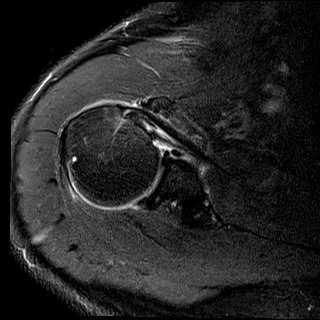
[im 21/31]
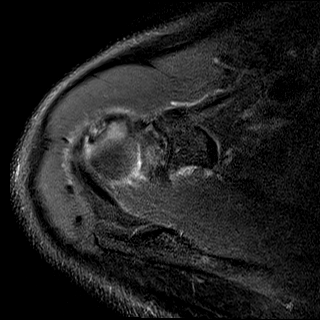
[im 27/31]
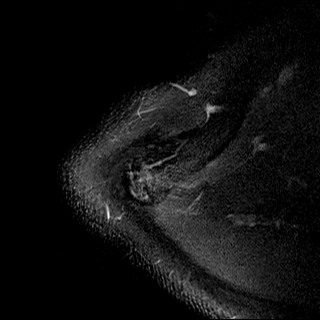
[im 31/31]
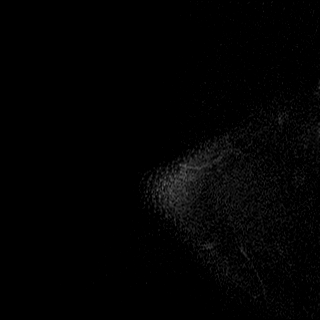

[Series 8: T2 fat-sat · coronal · right · 4.0mm · 0.44mm/px · 3 of 22 slices shown (2 of 3)]
[im 4/22]
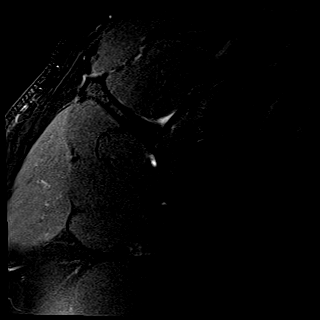
[im 13/22]
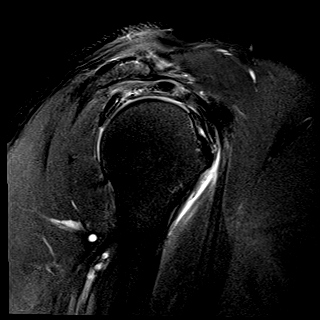
[im 19/22]
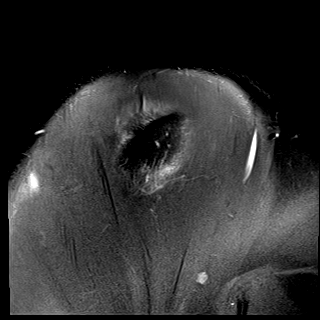

[Series 9: T2 fat-sat · oblique · right · 4.0mm · 0.22mm/px · 3 of 19 slices shown (3 of 3)]
[im 4/19]
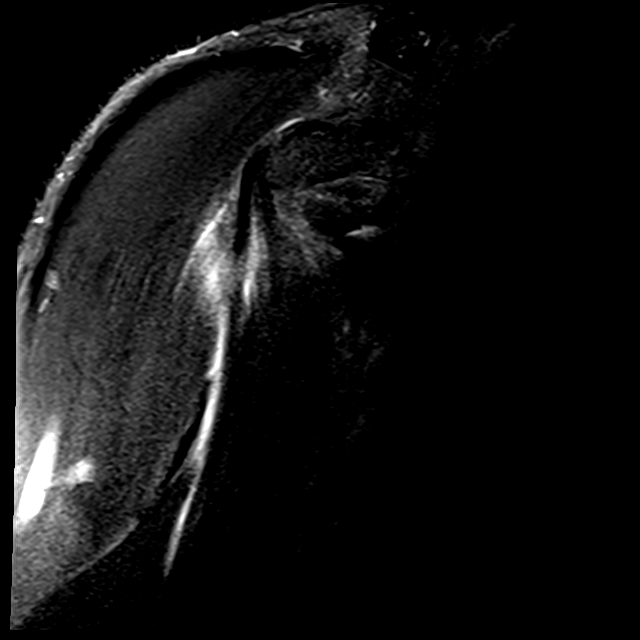
[im 10/19]
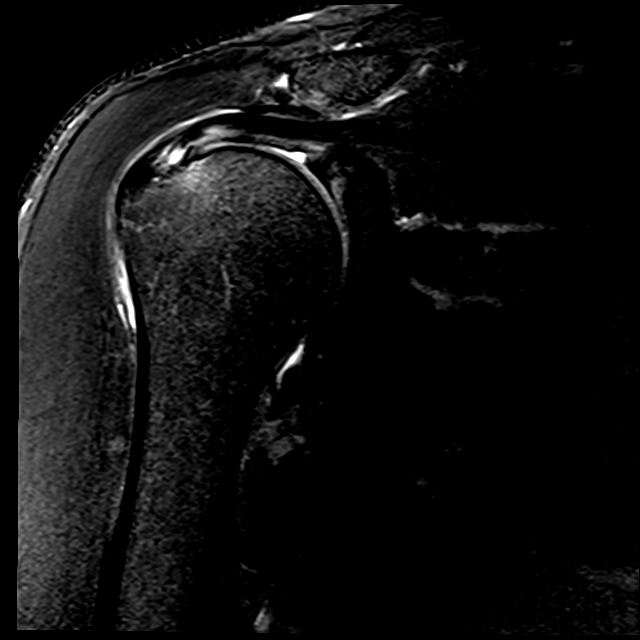
[im 16/19]
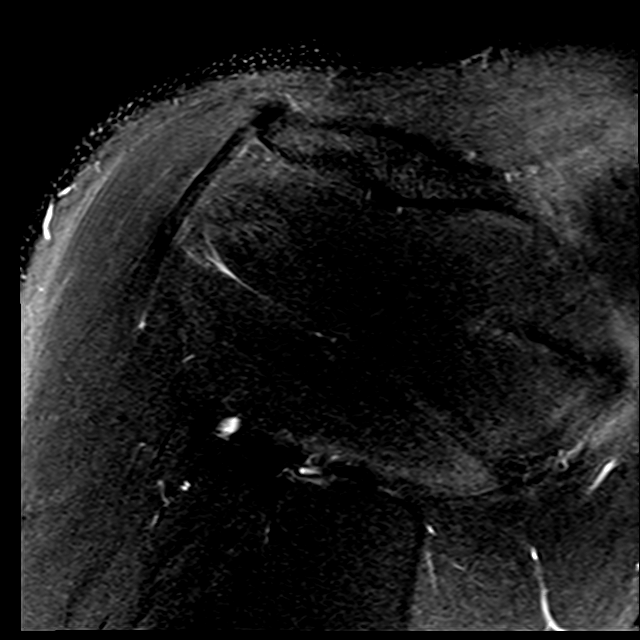

[Series 10: PD · oblique · right · 4.0mm · 0.22mm/px · 7 of 19 slices shown]
[im 1/19]
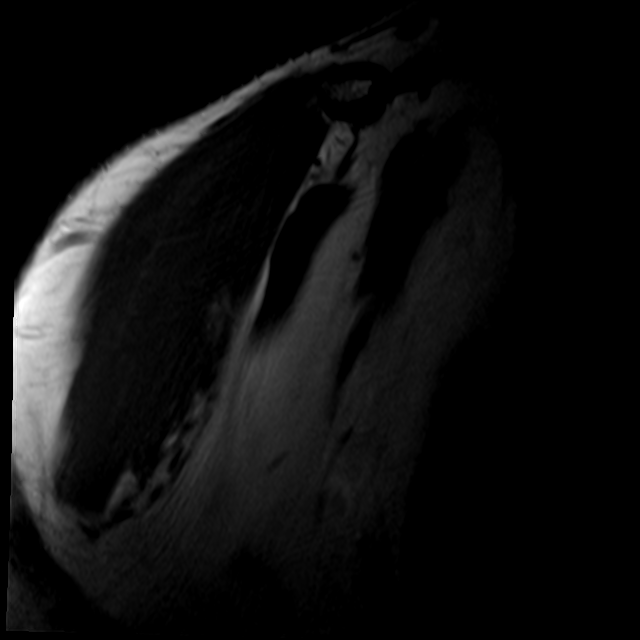
[im 4/19]
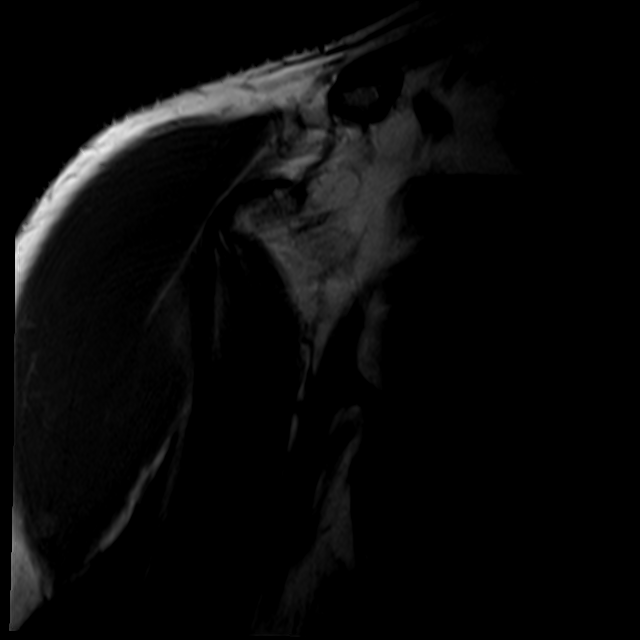
[im 7/19]
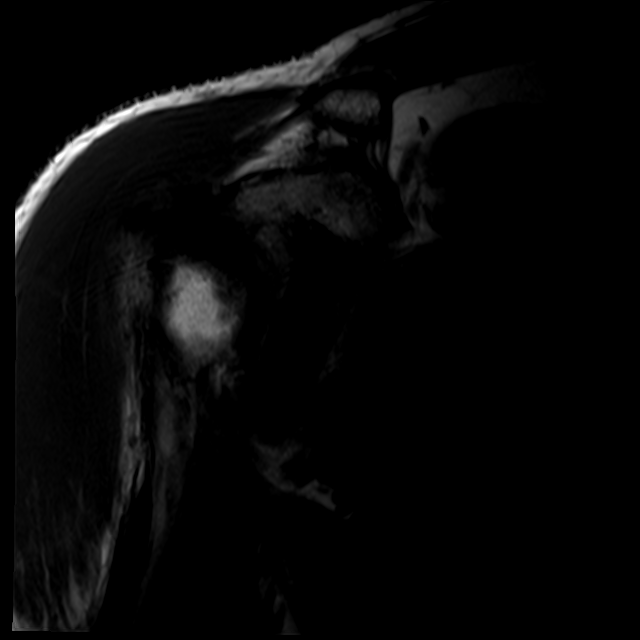
[im 10/19]
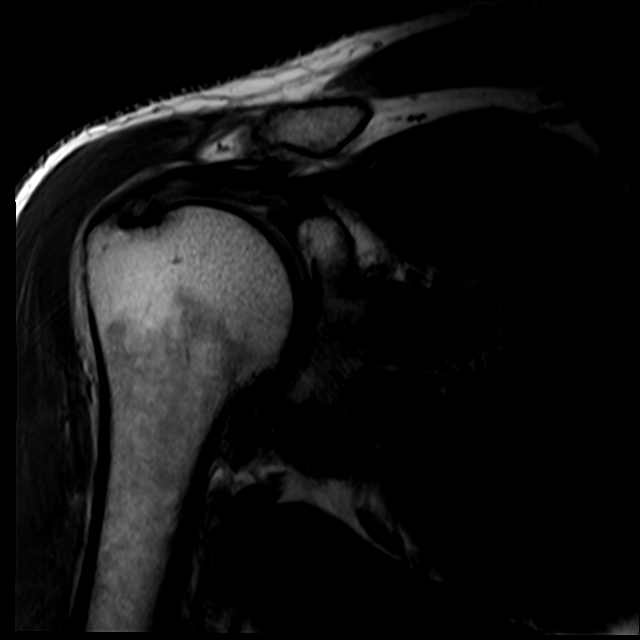
[im 13/19]
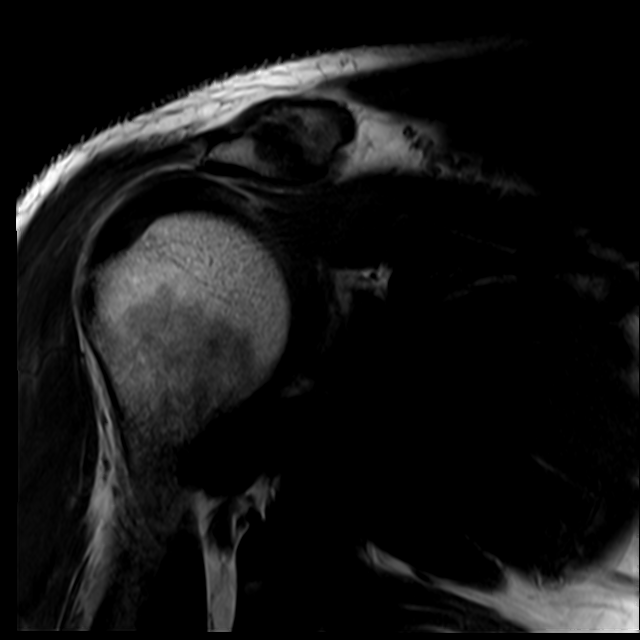
[im 16/19]
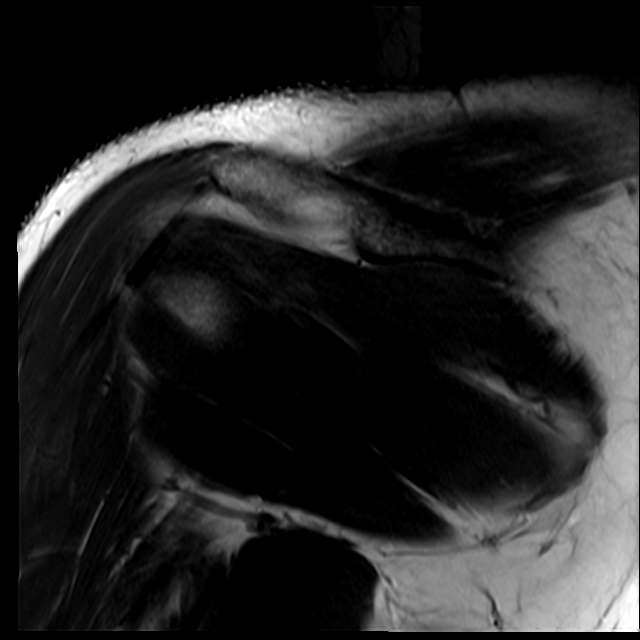
[im 19/19]
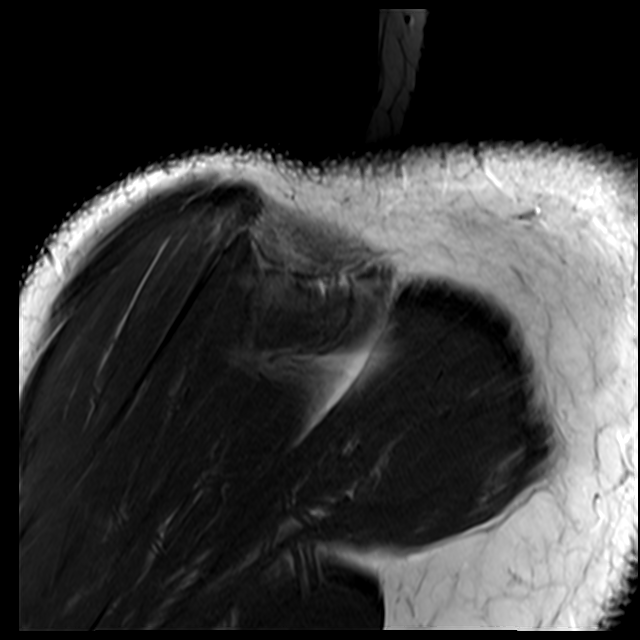

[21 of 40 positions shown; findings below may reference images not displayed]

FINDINGS: Rotator cuff: Moderate supraspinatus tendinosis with
partial-thickness insertional and interstitial tears of the anterior
to mid tendon (series 9, images 8-11). Moderate subscapularis
tendinosis with high-grade partial-thickness tear. Mild
infraspinatus tendinosis without tear. Intact teres minor.

Muscles:  No rotator cuff muscle atrophy or fatty infiltration.

Biceps long head: Intra-articular biceps tendinosis with suspected
split tear. Moderate tenosynovitis.

Acromioclavicular Joint: Mild arthropathy of the AC joint. Small
volume subacromial-subdeltoid bursal fluid.

Glenohumeral Joint: No chondral defect.  Trace joint effusion.

Labrum: Superior labrum appears slightly degenerated. No
well-defined tear on non-arthrographic imaging.

Bones:  No marrow abnormality, fracture or dislocation.

Other: None.
IMPRESSION: 1. Moderate supraspinatus and subscapularis tendinosis with
partial-thickness tears. No full-thickness or retracted rotator cuff
tear.
2. Intra-articular biceps tendinosis with suspected split tear.
Moderate tenosynovitis.
3. Mild subacromial-subdeltoid bursitis.

## 2021-03-04 ENCOUNTER — Ambulatory Visit (INDEPENDENT_AMBULATORY_CARE_PROVIDER_SITE_OTHER): Payer: Self-pay | Admitting: Orthopaedic Surgery

## 2021-03-04 ENCOUNTER — Encounter: Payer: Self-pay | Admitting: Orthopaedic Surgery

## 2021-03-04 ENCOUNTER — Other Ambulatory Visit: Payer: Self-pay

## 2021-03-04 DIAGNOSIS — S46211A Strain of muscle, fascia and tendon of other parts of biceps, right arm, initial encounter: Secondary | ICD-10-CM

## 2021-03-04 DIAGNOSIS — M75101 Unspecified rotator cuff tear or rupture of right shoulder, not specified as traumatic: Secondary | ICD-10-CM

## 2021-03-04 DIAGNOSIS — S46811A Strain of other muscles, fascia and tendons at shoulder and upper arm level, right arm, initial encounter: Secondary | ICD-10-CM

## 2021-03-04 NOTE — Progress Notes (Signed)
Office Visit Note   Patient: Michael Fleming           Date of Birth: 1971-02-03           MRN: [id number] Visit Date: 03/04/2021              Requested by: Orion Crook I, NP [address] [place],  [place] [address] PCP: Orion Crook I, NP   Assessment & Plan: Visit Diagnoses:  1. Nontraumatic tear of supraspinatus tendon, right   2. Partial tear of right subscapularis tendon, initial encounter   3. Traumatic partial tear of right biceps tendon, initial encounter     Plan: MRI shows partial-thickness tearing of the subscapularis and supraspinatus.  I do not see a full-thickness component.  He does have tendinosis with a split tear of the long head of the biceps tendon.  These findings were reviewed with Sayed in detail and I have recommended arthroscopic shoulder surgery with debridement of the subscap and supraspinatus indicated along with extensive debridement subacromial decompression and mini open biceps tenodesis.  Details of the surgery including risk benefits rehab recovery time out of work reviewed with the patient in detail.  Questions encouraged and answered.  Follow-Up Instructions: No follow-ups on file.   Orders:  No orders of the defined types were placed in this encounter.  No orders of the defined types were placed in this encounter.     Procedures: No procedures performed   Clinical Data: No additional findings.   Subjective: Chief Complaint  Patient presents with   Right Shoulder - Pain, Follow-up    Braxxton comes in today to discuss right shoulder MRI.  He feels that the pain is getting worse.  He is now unable to sleep for work.  He is having pain that radiates into the biceps and into the scapular region.   Review of Systems   Objective: Vital Signs: There were no vitals taken for this visit.  Physical Exam  Ortho Exam  Right shoulder examination shows moderate pain with empty can and bearhug testing.  Positive Speed test.   Negative Hawkins or Neer signs.  Negative cross body adduction.  Specialty Comments:  No specialty comments available.  Imaging: No results found.   PMFS History: Patient Active Problem List   Diagnosis Date Noted   Traumatic partial tear of right biceps tendon 03/04/2021   Partial tear of right subscapularis tendon 03/04/2021   Nontraumatic tear of supraspinatus tendon, right 03/04/2021   Hyperlipidemia 02/05/2019   Hypokalemia 02/05/2019   Vitamin D deficiency 02/05/2019   Tick bite of groin 11/27/2018   Back pain 08/06/2018   Blurry vision 08/06/2018   Pain in right wrist 03/27/2017   Chronic pain of left knee 03/27/2017   HTN (hypertension) 10/23/2016   History of brain surgery 10/23/2016   Chronic headache 10/23/2016   Past Medical History:  Diagnosis Date   Allergy    GERD (gastroesophageal reflux disease)    Heart palpitations 12/2018   Hyperlipidemia    Hypertension    Hypokalemia 12/2018   Seizures ([facility])    last seizure age 77 due to blood clot- 1981 none since     Family History  Problem Relation Age of Onset   Healthy Mother    Hypertension Father    Colon cancer Neg Hx    Colon polyps Neg Hx    Esophageal cancer Neg Hx    Rectal cancer Neg Hx    Stomach cancer Neg Hx  Past Surgical History:  Procedure Laterality Date   BRAIN SURGERY     1981 blot clot removed   FOOT SURGERY     right bunion removed   Social History   Occupational History   Not on file  Tobacco Use   Smoking status: Never   Smokeless tobacco: Never  Vaping Use   Vaping Use: Never used  Substance and Sexual Activity   Alcohol use: Yes    Comment: occasional   Drug use: No   Sexual activity: Not Currently

## 2021-03-15 ENCOUNTER — Other Ambulatory Visit: Payer: Self-pay

## 2021-03-15 MED FILL — Amlodipine Besylate Tab 10 MG (Base Equivalent): ORAL | 30 days supply | Qty: 30 | Fill #2 | Status: AC

## 2021-03-15 MED FILL — Losartan Potassium Tab 50 MG: ORAL | 30 days supply | Qty: 30 | Fill #2 | Status: AC

## 2021-03-15 MED FILL — Pravastatin Sodium Tab 80 MG: ORAL | 30 days supply | Qty: 30 | Fill #2 | Status: AC

## 2021-03-16 ENCOUNTER — Other Ambulatory Visit: Payer: Self-pay

## 2021-03-17 ENCOUNTER — Other Ambulatory Visit: Payer: Self-pay

## 2021-03-17 ENCOUNTER — Telehealth: Payer: Self-pay | Admitting: Orthopaedic Surgery

## 2021-03-17 NOTE — Telephone Encounter (Signed)
Patient is scheduled for right shoulder scope on 03-31-21 at [facility].  He has questions about the medication he will take after the surgery.  Please call patient to discuss.  [phone]

## 2021-03-19 NOTE — Telephone Encounter (Signed)
Called patient he just needed the [facility] address.

## 2021-03-23 ENCOUNTER — Encounter (HOSPITAL_BASED_OUTPATIENT_CLINIC_OR_DEPARTMENT_OTHER): Payer: Self-pay | Admitting: Orthopaedic Surgery

## 2021-03-23 ENCOUNTER — Other Ambulatory Visit: Payer: Self-pay

## 2021-03-26 ENCOUNTER — Encounter (HOSPITAL_BASED_OUTPATIENT_CLINIC_OR_DEPARTMENT_OTHER)
Admission: RE | Admit: 2021-03-26 | Discharge: 2021-03-26 | Disposition: A | Discharge status: Home / Self Care | Payer: Self-pay | Source: Ambulatory Visit | Attending: Orthopaedic Surgery | Admitting: Orthopaedic Surgery

## 2021-03-26 ENCOUNTER — Other Ambulatory Visit: Payer: Self-pay

## 2021-03-26 DIAGNOSIS — Z01810 Encounter for preprocedural cardiovascular examination: Secondary | ICD-10-CM | POA: Insufficient documentation

## 2021-03-26 DIAGNOSIS — I10 Essential (primary) hypertension: Secondary | ICD-10-CM | POA: Insufficient documentation

## 2021-03-26 NOTE — Progress Notes (Signed)

## 2021-03-30 NOTE — Anesthesia Preprocedure Evaluation (Addendum)
Anesthesia Evaluation  Patient identified by MRN, date of birth, ID band  Reviewed: Allergy & Precautions, NPO status , Patient's Chart, lab work & pertinent test results  Airway Mallampati: II  TM Distance: >3 FB Neck ROM: Full    Dental no notable dental hx. (+) Teeth Intact, Dental Advisory Given   Pulmonary neg pulmonary ROS,    Pulmonary exam normal breath sounds clear to auscultation       Cardiovascular hypertension, Pt. on medications Normal cardiovascular exam Rhythm:Regular Rate:Normal     Neuro/Psych  Headaches, negative psych ROS   GI/Hepatic Neg liver ROS,   Endo/Other  negative endocrine ROS  Renal/GU negative Renal ROS     Musculoskeletal negative musculoskeletal ROS (+)   Abdominal   Peds  Hematology Lab Results      Component                Value               Date                      WBC                      7.0                 02/10/2021                HGB                      13.5                02/10/2021                HCT                      40.1                02/10/2021                MCV                      84                  02/10/2021                PLT                      298                 02/10/2021              Anesthesia Other Findings   Reproductive/Obstetrics                           Anesthesia Physical Anesthesia Plan  ASA: 2  Anesthesia Plan: General   Post-op Pain Management:  Regional for Post-op pain   Induction: Intravenous  PONV Risk Score and Plan: 3 and Treatment may vary due to age or medical condition, Midazolam, Ondansetron and Dexamethasone  Airway Management Planned: Oral ETT  Additional Equipment: None  Intra-op Plan:   Post-operative Plan: Extubation in OR  Informed Consent: I have reviewed the patients History and Physical, chart, labs and discussed the procedure including the risks, benefits and alternatives for the  proposed anesthesia with the patient or authorized representative who has indicated his/her  understanding and acceptance.     Dental advisory given  Plan Discussed with:   Anesthesia Plan Comments: (GA w R ISB w exparel)       Anesthesia Quick Evaluation

## 2021-03-31 ENCOUNTER — Encounter (HOSPITAL_BASED_OUTPATIENT_CLINIC_OR_DEPARTMENT_OTHER): Payer: Self-pay | Admitting: Orthopaedic Surgery

## 2021-03-31 ENCOUNTER — Other Ambulatory Visit: Payer: Self-pay

## 2021-03-31 ENCOUNTER — Encounter (HOSPITAL_BASED_OUTPATIENT_CLINIC_OR_DEPARTMENT_OTHER)
Admission: RE | Disposition: A | Discharge status: Home / Self Care | Payer: Self-pay | Source: Home / Self Care | Attending: Orthopaedic Surgery

## 2021-03-31 ENCOUNTER — Encounter: Payer: Self-pay | Admitting: Orthopaedic Surgery

## 2021-03-31 ENCOUNTER — Ambulatory Visit (HOSPITAL_BASED_OUTPATIENT_CLINIC_OR_DEPARTMENT_OTHER)
Admission: RE | Admit: 2021-03-31 | Discharge: 2021-03-31 | Disposition: A | Discharge status: Home / Self Care | Payer: Self-pay | Attending: Orthopaedic Surgery | Admitting: Orthopaedic Surgery

## 2021-03-31 ENCOUNTER — Ambulatory Visit (HOSPITAL_BASED_OUTPATIENT_CLINIC_OR_DEPARTMENT_OTHER): Payer: Self-pay | Admitting: Anesthesiology

## 2021-03-31 DIAGNOSIS — M67811 Other specified disorders of synovium, right shoulder: Secondary | ICD-10-CM

## 2021-03-31 DIAGNOSIS — M67921 Unspecified disorder of synovium and tendon, right upper arm: Secondary | ICD-10-CM

## 2021-03-31 DIAGNOSIS — M67911 Unspecified disorder of synovium and tendon, right shoulder: Secondary | ICD-10-CM | POA: Insufficient documentation

## 2021-03-31 DIAGNOSIS — M67813 Other specified disorders of tendon, right shoulder: Secondary | ICD-10-CM

## 2021-03-31 DIAGNOSIS — S46211A Strain of muscle, fascia and tendon of other parts of biceps, right arm, initial encounter: Secondary | ICD-10-CM | POA: Insufficient documentation

## 2021-03-31 DIAGNOSIS — S43431A Superior glenoid labrum lesion of right shoulder, initial encounter: Secondary | ICD-10-CM

## 2021-03-31 DIAGNOSIS — X58XXXA Exposure to other specified factors, initial encounter: Secondary | ICD-10-CM | POA: Insufficient documentation

## 2021-03-31 DIAGNOSIS — M67819 Other specified disorders of synovium and tendon, unspecified shoulder: Secondary | ICD-10-CM

## 2021-03-31 HISTORY — PX: SHOULDER ARTHROSCOPY WITH SUBACROMIAL DECOMPRESSION AND BICEP TENDON REPAIR: SHX5689

## 2021-03-31 HISTORY — DX: Strain of muscle, fascia and tendon of other parts of biceps, unspecified arm, initial encounter: S46.219A

## 2021-03-31 SURGERY — SHOULDER ARTHROSCOPY WITH SUBACROMIAL DECOMPRESSION AND BICEP TENDON REPAIR
Anesthesia: General | Site: Shoulder | Laterality: Right

## 2021-03-31 MED ORDER — BUPIVACAINE HCL (PF) 0.5 % IJ SOLN
INTRAMUSCULAR | Status: DC | PRN
  Administered 2021-03-31: 15 mL via PERINEURAL

## 2021-03-31 MED ORDER — FENTANYL CITRATE (PF) 100 MCG/2ML IJ SOLN
INTRAMUSCULAR | Status: AC
  Filled 2021-03-31: qty 2

## 2021-03-31 MED ORDER — PHENYLEPHRINE HCL (PRESSORS) 10 MG/ML IV SOLN
INTRAVENOUS | Status: AC
  Filled 2021-03-31: qty 1

## 2021-03-31 MED ORDER — PROPOFOL 10 MG/ML IV BOLUS
INTRAVENOUS | Status: DC | PRN
  Administered 2021-03-31: 130 mg via INTRAVENOUS

## 2021-03-31 MED ORDER — LACTATED RINGERS IV SOLN: INTRAVENOUS | Status: DC

## 2021-03-31 MED ORDER — AMISULPRIDE (ANTIEMETIC) 5 MG/2ML IV SOLN: 10.0000 mg | Freq: Once | INTRAVENOUS | Status: DC | PRN

## 2021-03-31 MED ORDER — ROCURONIUM BROMIDE 10 MG/ML (PF) SYRINGE
PREFILLED_SYRINGE | INTRAVENOUS | Status: AC
  Filled 2021-03-31: qty 10

## 2021-03-31 MED ORDER — FENTANYL CITRATE (PF) 100 MCG/2ML IJ SOLN
100.0000 ug | Freq: Once | INTRAMUSCULAR | Status: AC
  Administered 2021-03-31: 100 ug via INTRAVENOUS

## 2021-03-31 MED ORDER — CEFAZOLIN SODIUM-DEXTROSE 2-4 GM/100ML-% IV SOLN
INTRAVENOUS | Status: AC
  Filled 2021-03-31: qty 100

## 2021-03-31 MED ORDER — MIDAZOLAM HCL 2 MG/2ML IJ SOLN
INTRAMUSCULAR | Status: AC
  Filled 2021-03-31: qty 2

## 2021-03-31 MED ORDER — OXYCODONE HCL 5 MG PO TABS: 5.0000 mg | ORAL_TABLET | Freq: Once | ORAL | Status: DC | PRN

## 2021-03-31 MED ORDER — DEXAMETHASONE SODIUM PHOSPHATE 10 MG/ML IJ SOLN
INTRAMUSCULAR | Status: AC
  Filled 2021-03-31: qty 1

## 2021-03-31 MED ORDER — SODIUM CHLORIDE 0.9 % IR SOLN
Status: DC | PRN
  Administered 2021-03-31: 6000 mL

## 2021-03-31 MED ORDER — ONDANSETRON HCL 4 MG/2ML IJ SOLN
INTRAMUSCULAR | Status: DC | PRN
  Administered 2021-03-31: 4 mg via INTRAVENOUS

## 2021-03-31 MED ORDER — ONDANSETRON HCL 4 MG/2ML IJ SOLN
INTRAMUSCULAR | Status: AC
  Filled 2021-03-31: qty 2

## 2021-03-31 MED ORDER — DEXAMETHASONE SODIUM PHOSPHATE 4 MG/ML IJ SOLN
INTRAMUSCULAR | Status: DC | PRN
  Administered 2021-03-31: 10 mg via INTRAVENOUS

## 2021-03-31 MED ORDER — MIDAZOLAM HCL 2 MG/2ML IJ SOLN
2.0000 mg | Freq: Once | INTRAMUSCULAR | Status: AC
  Administered 2021-03-31: 2 mg via INTRAVENOUS

## 2021-03-31 MED ORDER — FENTANYL CITRATE (PF) 100 MCG/2ML IJ SOLN
INTRAMUSCULAR | Status: DC | PRN
  Administered 2021-03-31: 100 ug via INTRAVENOUS

## 2021-03-31 MED ORDER — CEFAZOLIN SODIUM-DEXTROSE 2-4 GM/100ML-% IV SOLN
2.0000 g | INTRAVENOUS | Status: AC
  Administered 2021-03-31: 2 g via INTRAVENOUS

## 2021-03-31 MED ORDER — LIDOCAINE HCL (CARDIAC) PF 100 MG/5ML IV SOSY
PREFILLED_SYRINGE | INTRAVENOUS | Status: DC | PRN
  Administered 2021-03-31: 80 mg via INTRAVENOUS

## 2021-03-31 MED ORDER — OXYCODONE HCL 5 MG/5ML PO SOLN: 5.0000 mg | Freq: Once | ORAL | Status: DC | PRN

## 2021-03-31 MED ORDER — SUGAMMADEX SODIUM 200 MG/2ML IV SOLN
INTRAVENOUS | Status: DC | PRN
  Administered 2021-03-31: 200 mg via INTRAVENOUS

## 2021-03-31 MED ORDER — LIDOCAINE 2% (20 MG/ML) 5 ML SYRINGE
INTRAMUSCULAR | Status: AC
  Filled 2021-03-31: qty 5

## 2021-03-31 MED ORDER — OXYCODONE-ACETAMINOPHEN 5-325 MG PO TABS: 1.0000 | ORAL_TABLET | Freq: Three times a day (TID) | ORAL | 0 refills | Status: DC | PRN

## 2021-03-31 MED ORDER — BUPIVACAINE LIPOSOME 1.3 % IJ SUSP
INTRAMUSCULAR | Status: DC | PRN
  Administered 2021-03-31: 10 mL via PERINEURAL

## 2021-03-31 MED ORDER — BUPIVACAINE-EPINEPHRINE 0.25% -1:200000 IJ SOLN
INTRAMUSCULAR | Status: DC | PRN
  Administered 2021-03-31: 20 mL

## 2021-03-31 MED ORDER — PHENYLEPHRINE HCL-NACL 20-0.9 MG/250ML-% IV SOLN
INTRAVENOUS | Status: DC | PRN
  Administered 2021-03-31: 50 ug/min via INTRAVENOUS

## 2021-03-31 MED ORDER — ACETAMINOPHEN 10 MG/ML IV SOLN: 1000.0000 mg | Freq: Once | INTRAVENOUS | Status: DC | PRN

## 2021-03-31 MED ORDER — ONDANSETRON HCL 4 MG/2ML IJ SOLN: 4.0000 mg | Freq: Once | INTRAMUSCULAR | Status: DC | PRN

## 2021-03-31 MED ORDER — ROCURONIUM BROMIDE 100 MG/10ML IV SOLN
INTRAVENOUS | Status: DC | PRN
  Administered 2021-03-31: 70 mg via INTRAVENOUS

## 2021-03-31 MED ORDER — HYDROMORPHONE HCL 1 MG/ML IJ SOLN: 0.2500 mg | INTRAMUSCULAR | Status: DC | PRN

## 2021-03-31 MED ORDER — PROPOFOL 10 MG/ML IV BOLUS
INTRAVENOUS | Status: AC
  Filled 2021-03-31: qty 20

## 2021-03-31 SURGICAL SUPPLY — 65 items
ANCH SUT KNTLS STRL SHLDR SYS (Anchor) ×2 IMPLANT
ANCH SUT STRL SHLDR SYS ROTR (Anchor) ×2 IMPLANT
ANCHOR SUT GL2 QUATTRO 2.9 (Anchor) ×2 IMPLANT
ANCHOR SUT QUATTRO KNTLS 4.5 (Anchor) ×2 IMPLANT
APL SKNCLS STERI-STRIP NONHPOA (GAUZE/BANDAGES/DRESSINGS)
BENZOIN TINCTURE PRP APPL 2/3 (GAUZE/BANDAGES/DRESSINGS) IMPLANT
BIT DRILL HRD BONE QL 2.9 ANCH (DRILL) ×1 IMPLANT
BLADE SURG 15 STRL LF DISP TIS (BLADE) ×3 IMPLANT
BLADE SURG 15 STRL SS (BLADE) ×3
BURR OVAL 8 FLU 4.0X13 (MISCELLANEOUS) ×4 IMPLANT
CANNULA 5.75X71 LONG (CANNULA) ×4 IMPLANT
CANNULA SHOULDER 7CM (CANNULA) ×4 IMPLANT
CANNULA TWIST IN 8.25X7CM (CANNULA) IMPLANT
CLSR STERI-STRIP ANTIMIC 1/2X4 (GAUZE/BANDAGES/DRESSINGS) IMPLANT
COOLER ICEMAN CLASSIC (MISCELLANEOUS) ×4 IMPLANT
COVER MAYO STAND STRL (DRAPES) ×2 IMPLANT
DISSECTOR  3.8MM X 13CM (MISCELLANEOUS) ×3
DISSECTOR 3.8MM X 13CM (MISCELLANEOUS) ×3 IMPLANT
DRAPE IMP U-DRAPE 54X76 (DRAPES) ×4 IMPLANT
DRAPE INCISE IOBAN 66X45 STRL (DRAPES) ×4 IMPLANT
DRAPE STERI 35X30 U-POUCH (DRAPES) ×4 IMPLANT
DRAPE U-SHAPE 47X51 STRL (DRAPES) ×6 IMPLANT
DRAPE U-SHAPE 76X120 STRL (DRAPES) ×8 IMPLANT
DRILL HARD BONE QL 2.9 ANCH (DRILL) ×3
DRSG PAD ABDOMINAL 8X10 ST (GAUZE/BANDAGES/DRESSINGS) ×8 IMPLANT
DURAPREP 26ML APPLICATOR (WOUND CARE) ×8 IMPLANT
ELECT REM PT RETURN 9FT ADLT (ELECTROSURGICAL) ×3
ELECTRODE REM PT RTRN 9FT ADLT (ELECTROSURGICAL) ×1 IMPLANT
GAUZE SPONGE 4X4 12PLY STRL (GAUZE/BANDAGES/DRESSINGS) ×8 IMPLANT
GAUZE XEROFORM 1X8 LF (GAUZE/BANDAGES/DRESSINGS) ×4 IMPLANT
GLOVE SURG NEOP MICRO LF SZ7.5 (GLOVE) ×6 IMPLANT
GLOVE SURG SYN 7.5  E (GLOVE) ×6
GLOVE SURG SYN 7.5 E (GLOVE) ×4 IMPLANT
GLOVE SURG SYN 7.5 PF PI (GLOVE) ×2 IMPLANT
GLOVE SURG UNDER POLY LF SZ6.5 (GLOVE) ×2 IMPLANT
GLOVE SURG UNDER POLY LF SZ7 (GLOVE) ×8 IMPLANT
GLOVE SURG UNDER POLY LF SZ7.5 (GLOVE) ×4 IMPLANT
GOWN STRL REIN XL XLG (GOWN DISPOSABLE) ×8 IMPLANT
GOWN STRL REUS W/ TWL LRG LVL3 (GOWN DISPOSABLE) ×3 IMPLANT
GOWN STRL REUS W/TWL LRG LVL3 (GOWN DISPOSABLE) ×3
MANIFOLD NEPTUNE II (INSTRUMENTS) ×4 IMPLANT
NDL 1/2 CIR CATGUT .05X1.09 (NEEDLE) IMPLANT
NEEDLE 1/2 CIR CATGUT .05X1.09 (NEEDLE) ×3 IMPLANT
PACK ARTHROSCOPY DSU (CUSTOM PROCEDURE TRAY) ×4 IMPLANT
PACK BASIN DAY SURGERY FS (CUSTOM PROCEDURE TRAY) ×4 IMPLANT
PAD COLD SHLDR WRAP-ON (PAD) ×4 IMPLANT
PENCIL SMOKE EVACUATOR (MISCELLANEOUS) ×2 IMPLANT
PORT APPOLLO RF 90DEGREE MULTI (SURGICAL WAND) ×4 IMPLANT
SHEET MEDIUM DRAPE 40X70 STRL (DRAPES) ×8 IMPLANT
SLEEVE SCD COMPRESS KNEE MED (STOCKING) ×4 IMPLANT
SLING ARM FOAM STRAP LRG (SOFTGOODS) ×2 IMPLANT
SPONGE T-LAP 4X18 ~~LOC~~+RFID (SPONGE) ×2 IMPLANT
SUCTION FRAZIER HANDLE 10FR (MISCELLANEOUS) ×3
SUCTION TUBE FRAZIER 10FR DISP (MISCELLANEOUS) ×1 IMPLANT
SUT ETHILON 3 0 PS 1 (SUTURE) ×6 IMPLANT
SUT MON AB 4-0 PS1 27 (SUTURE) IMPLANT
SUT VIC AB 0 CT1 27 (SUTURE) ×6
SUT VIC AB 0 CT1 27XBRD ANBCTR (SUTURE) ×2 IMPLANT
SUT VIC AB 2-0 CT1 27 (SUTURE) ×3
SUT VIC AB 2-0 CT1 TAPERPNT 27 (SUTURE) ×1 IMPLANT
SYR 50ML LL SCALE MARK (SYRINGE) ×4 IMPLANT
TOWEL GREEN STERILE FF (TOWEL DISPOSABLE) ×8 IMPLANT
TUBE CONNECTING 20X1/4 (TUBING) ×4 IMPLANT
TUBING ARTHROSCOPY IRRIG 16FT (MISCELLANEOUS) ×4 IMPLANT
YANKAUER SUCT BULB TIP NO VENT (SUCTIONS) ×2 IMPLANT

## 2021-03-31 NOTE — Anesthesia Procedure Notes (Addendum)
Anesthesia Regional Block: Interscalene brachial plexus block   Pre-Anesthetic Checklist: , timeout performed,  Correct Patient, Correct Site, Correct Laterality,  Correct Procedure, Correct Position, site marked,  Risks and benefits discussed,  Surgical consent,  Pre-op evaluation,  At surgeon's request and post-op pain management  Laterality: Upper and Right  Prep: Maximum Sterile Barrier Precautions used, chloraprep       Needles:  Injection technique: Single-shot  Needle Type: Echogenic Needle     Needle Length: 5cm  Needle Gauge: 21     Additional Needles:   Procedures:,,,, ultrasound used (permanent image in chart),,    Narrative:  Start time: 03/31/2021 10:48 AM End time: 03/31/2021 10:55 AM Injection made incrementally with aspirations every 5 mL.  Performed by: Personally  Anesthesiologist: Trevor Iha, MD  Additional Notes: Block assessed prior to procedure. Patient tolerated procedure well.

## 2021-03-31 NOTE — Anesthesia Postprocedure Evaluation (Signed)
Anesthesia Post Note  Patient: KIEN MIRSKY  Procedure(s) Performed: RIGHT SHOULDER ARTHROSCOPY, EXTENSIVE DEBRIDEMENT, SUBACROMIAL DECOMPRESSION, OPEN BICEPS TENODESIS (Right: Shoulder)     Patient location during evaluation: PACU Anesthesia Type: General Level of consciousness: awake and alert Pain management: pain level controlled Vital Signs Assessment: post-procedure vital signs reviewed and stable Respiratory status: spontaneous breathing, nonlabored ventilation, respiratory function stable and patient connected to nasal cannula oxygen Cardiovascular status: blood pressure returned to baseline and stable Postop Assessment: no apparent nausea or vomiting Anesthetic complications: no   No notable events documented.  Last Vitals:  Vitals:   03/31/21 1400 03/31/21 1415  BP: 114/77 118/74  Pulse: 70 75  Resp: 13 14  Temp:    SpO2: 95% 98%    Last Pain:  Vitals:   03/31/21 1423  TempSrc:   PainSc: 0-No pain                 Trevor Iha

## 2021-03-31 NOTE — Anesthesia Procedure Notes (Signed)
Procedure Name: Intubation Date/Time: 03/31/2021 11:57 AM Performed by: Maryella Shivers, CRNA Pre-anesthesia Checklist: Patient identified, Emergency Drugs available, Suction available and Patient being monitored Patient Re-evaluated:Patient Re-evaluated prior to induction Oxygen Delivery Method: Circle system utilized Preoxygenation: Pre-oxygenation with 100% oxygen Induction Type: IV induction Ventilation: Mask ventilation without difficulty Laryngoscope Size: Mac and 4 Grade View: Grade II Tube type: Oral Tube size: 7.5 mm Number of attempts: 1 Airway Equipment and Method: Stylet and Oral airway Placement Confirmation: ETT inserted through vocal cords under direct vision, positive ETCO2 and breath sounds checked- equal and bilateral Secured at: 21 cm Tube secured with: Tape Dental Injury: Teeth and Oropharynx as per pre-operative assessment

## 2021-03-31 NOTE — Discharge Instructions (Addendum)
Post-operative patient instructions  Shoulder Arthroscopy   Ice:  Place intermittent ice or cooler pack over your shoulder, 30 minutes on and 30 minutes off.  Continue this for the first 72 hours after surgery, then save ice for use after therapy sessions or on more active days.   Weight:  You may bear weight on your arm as your symptoms allow. Motion:  Perform gentle shoulder motion as tolerated Dressing:  Perform 1st dressing change at 2 days postoperative. A moderate amount of blood tinged drainage is to be expected.  So if you bleed through the dressing on the first or second day or if you have fevers, it is fine to change the dressing/check the wounds early and redress wound.  If it bleeds through again, or if the incisions are leaking frank blood, please call the office. May change dressing every 1-2 days thereafter to help watch wounds. Can purchase Tegaderm (or 37M Nexcare) water resistant dressings at local pharmacy / [employer]. Shower:  Light shower is ok after 2 days.  Please take shower, NO bath. Recover with gauze and ace wrap to help keep wounds protected.   Pain medication:  A narcotic pain medication has been prescribed.  Take as directed.  Typically you need narcotic pain medication more regularly during the first 3 to 5 days after surgery.  Decrease your use of the medication as the pain improves.  Narcotics can sometimes cause constipation, even after a few doses.  If you have problems with constipation, you can take an over the counter stool softener or light laxative.  If you have persistent problems, please notify your physician's office. Physical therapy: Additional activity guidelines to be provided by your physician or physical therapist at follow-up visits.  Driving: Do not recommend driving x 2 weeks post surgical, especially if surgery performed on right side. Should not drive while taking narcotic pain medications. It typically takes at least 2 weeks to restore sufficient  neuromuscular function for normal reaction times for driving safety.  Call [phone] for questions or problems. Evenings you will be forwarded to the hospital operator.  Ask for the orthopaedic physician on call. Please call if you experience:    Redness, foul smelling, or persistent drainage from the surgical site  worsening shoulder pain and swelling not responsive to medication  any calf pain and or swelling of the lower leg  temperatures greater than 101.5 F other questions or concerns   Thank you for allowing [place] to be a part of your care.  Regional Anesthesia Blocks  1. Numbness or the inability to move the "blocked" extremity may last from 3-48 hours after placement. The length of time depends on the medication injected and your individual response to the medication. If the numbness is not going away after 48 hours, call your surgeon.  2. The extremity that is blocked will need to be protected until the numbness is gone and the  Strength has returned. Because you cannot feel it, you will need to take extra care to avoid injury. Because it may be weak, you may have difficulty moving it or using it. You may not know what position it is in without looking at it while the block is in effect.  3. For blocks in the legs and feet, returning to weight bearing and walking needs to be done carefully. You will need to wait until the numbness is entirely gone and the strength has returned. You should be able to move your leg and foot  normally before you try and bear weight or walk. You will need someone to be with you when you first try to ensure you do not fall and possibly risk injury.  4. Bruising and tenderness at the needle site are common side effects and will resolve in a few days.  5. Persistent numbness or new problems with movement should be communicated to the surgeon or the [facility] [phone] [facility] [phone]).    Post Anesthesia Home Care  Instructions  Activity: Get plenty of rest for the remainder of the day. A responsible individual must stay with you for 24 hours following the procedure.  For the next 24 hours, DO NOT: -Drive a car -Advertising copywriter -Drink alcoholic beverages -Take any medication unless instructed by your physician -Make any legal decisions or sign important papers.  Meals: Start with liquid foods such as gelatin or soup. Progress to regular foods as tolerated. Avoid greasy, spicy, heavy foods. If nausea and/or vomiting occur, drink only clear liquids until the nausea and/or vomiting subsides. Call your physician if vomiting continues.  Special Instructions/Symptoms: Your throat may feel dry or sore from the anesthesia or the breathing tube placed in your throat during surgery. If this causes discomfort, gargle with warm salt water. The discomfort should disappear within 24 hours.  If you had a scopolamine patch placed behind your ear for the management of post- operative nausea and/or vomiting:  1. The medication in the patch is effective for 72 hours, after which it should be removed.  Wrap patch in a tissue and discard in the trash. Wash hands thoroughly with soap and water. 2. You may remove the patch earlier than 72 hours if you experience unpleasant side effects which may include dry mouth, dizziness or visual disturbances. 3. Avoid touching the patch. Wash your hands with soap and water after contact with the patch.

## 2021-03-31 NOTE — Transfer of Care (Signed)
Immediate Anesthesia Transfer of Care Note  Patient: Michael Fleming  Procedure(s) Performed: RIGHT SHOULDER ARTHROSCOPY, EXTENSIVE DEBRIDEMENT, SUBACROMIAL DECOMPRESSION, OPEN BICEPS TENODESIS (Right: Shoulder)  Patient Location: PACU  Anesthesia Type:GA combined with regional for post-op pain  Level of Consciousness: sedated  Airway & Oxygen Therapy: Patient Spontanous Breathing and Patient connected to face mask oxygen  Post-op Assessment: Report given to RN and Post -op Vital signs reviewed and stable  Post vital signs: Reviewed and stable  Last Vitals:  Vitals Value Taken Time  BP 114/77 03/31/21 1400  Temp 36.5 C 03/31/21 1357  Pulse 78 03/31/21 1400  Resp 22 03/31/21 1400  SpO2 79 % 03/31/21 1400  Vitals shown include unvalidated device data.  Last Pain:  Vitals:   03/31/21 1357  TempSrc:   PainSc: Asleep      Patients Stated Pain Goal: 2 (03/31/21 1030)  Complications: No notable events documented.

## 2021-03-31 NOTE — H&P (Signed)
PREOPERATIVE H&P  Chief Complaint: right biceps tear, subscapularis tear, supraspinatus tear  HPI: Michael Fleming is a 50 y.o. male who presents for surgical treatment of right biceps tear, subscapularis tear, supraspinatus tear.  He denies any changes in medical history.  Past Medical History:  Diagnosis Date   Allergy    Biceps tendon tear    right   Heart palpitations 12/2018   Hyperlipidemia    Hypertension    Hypokalemia 12/2018   Seizures (HCC)    last seizure age 56 due to blood clot- 1981 none since    Past Surgical History:  Procedure Laterality Date   BRAIN SURGERY     1981 blot clot removed   FOOT SURGERY     right bunion removed   KNEE ARTHROSCOPY Left    WRIST SURGERY Left    Social History   Socioeconomic History   Marital status: Single    Spouse name: Not on file   Number of children: Not on file   Years of education: Not on file   Highest education level: Not on file  Occupational History   Not on file  Tobacco Use   Smoking status: Never   Smokeless tobacco: Never  Vaping Use   Vaping Use: Never used  Substance and Sexual Activity   Alcohol use: Yes    Comment: occasional   Drug use: No   Sexual activity: Not Currently  Other Topics Concern   Not on file  Social History Narrative   Not on file   Social Determinants of Health   Financial Resource Strain: Not on file  Food Insecurity: Not on file  Transportation Needs: Not on file  Physical Activity: Not on file  Stress: Not on file  Social Connections: Not on file   Family History  Problem Relation Age of Onset   Healthy Mother    Hypertension Father    Colon cancer Neg Hx    Colon polyps Neg Hx    Esophageal cancer Neg Hx    Rectal cancer Neg Hx    Stomach cancer Neg Hx    No Known Allergies Prior to Admission medications   Medication Sig Start Date End Date Taking? Authorizing Provider  amLODipine (NORVASC) 10 MG tablet TAKE 1 TABLET (10 MG TOTAL) BY MOUTH DAILY.  08/05/20 08/05/21 Yes Kallie Locks, FNP  cyclobenzaprine (FLEXERIL) 10 MG tablet Take 1 tablet (10 mg total) by mouth 3 (three) times daily as needed for muscle spasms (shoulder pain). 02/12/21  Yes Passmore, Enid Derry I, NP  losartan (COZAAR) 50 MG tablet TAKE 1 TABLET (50 MG TOTAL) BY MOUTH DAILY. 08/05/20 08/05/21 Yes Kallie Locks, FNP  naproxen (NAPROSYN) 500 MG tablet Take 1 tablet (500 mg total) by mouth 3 (three) times daily as needed for moderate pain. 02/10/21  Yes Passmore, Enid Derry I, NP  pravastatin (PRAVACHOL) 80 MG tablet TAKE 1 TABLET (80 MG TOTAL) BY MOUTH DAILY. 08/05/20 08/05/21 Yes Kallie Locks, FNP  oxyCODONE-acetaminophen (PERCOCET) 5-325 MG tablet Take 1-2 tablets by mouth every 8 (eight) hours as needed for severe pain. 03/31/21  Yes Tarry Kos, MD  sildenafil (VIAGRA) 100 MG tablet TAKE 0.5-1 TABLETS (50-100 MG TOTAL) BY MOUTH DAILY AS NEEDED FOR ERECTILE DYSFUNCTION. 08/05/20 08/05/21  Kallie Locks, FNP     Positive ROS: All other systems have been reviewed and were otherwise negative with the exception of those mentioned in the HPI and as above.  Physical Exam: General: Alert, no acute  distress Cardiovascular: No pedal edema Respiratory: No cyanosis, no use of accessory musculature GI: abdomen soft Skin: No lesions in the area of chief complaint Neurologic: Sensation intact distally Psychiatric: Patient is competent for consent with normal mood and affect Lymphatic: no lymphedema  MUSCULOSKELETAL: exam stable  Assessment: right biceps tear, subscapularis tear, supraspinatus tear  Plan: Plan for Procedure(s): RIGHT SHOULDER ARTHROSCOPY, EXTENSIVE DEBRIDEMENT, SUBACROMIAL DECOMPRESSION, BICEPS TENODESIS, POSSIBLE MINI OPEN ROTATOR CUFF REPAIR  The risks benefits and alternatives were discussed with the patient including but not limited to the risks of nonoperative treatment, versus surgical intervention including infection, bleeding, nerve injury,  blood  clots, cardiopulmonary complications, morbidity, mortality, among others, and they were willing to proceed.   Preoperative templating of the joint replacement has been completed, documented, and submitted to the Operating Room personnel in order to optimize intra-operative equipment management.   Glee Arvin, MD 03/31/2021 11:27 AM

## 2021-03-31 NOTE — Progress Notes (Signed)
AssistedDr. Houser with right, ultrasound guided, interscalene  block. Side rails up, monitors on throughout procedure. See vital signs in flow sheet. Tolerated Procedure well.  

## 2021-03-31 NOTE — Op Note (Signed)
Date of Surgery: 03/31/2021  INDICATIONS: The patient is a 50 year old male with right shoulder pain that has failed conservative treatment;  The patient did consent to the procedure after discussion of the risks and benefits.  PREOPERATIVE DIAGNOSIS:  Tendinopathy of right supraspinatus and subscapularis Tendinopathy of long head of right biceps with tenosynovitis and split tear Degenerative right SLAP tear Right shoulder synovitis  POSTOPERATIVE DIAGNOSIS: Same.  PROCEDURE:  Right shoulder arthroscopic extensive debridement of labrum, rotator interval, biceps, supraspinatus, subscapularis Right shoulder arthroscopic assisted supra-pectoral biceps tenodesis Right shoulder subacromial decompression   SURGEON: N. Glee Arvin, M.D.  ASSIST: Oneal Grout, PA-C  ANESTHESIA:  general, regional  IV FLUIDS AND URINE: See anesthesia.  ESTIMATED BLOOD LOSS: minimal mL.  IMPLANTS: [place] 2.9 mm anchor, Cay[place] 4.5 mm anchor  COMPLICATIONS: None.  DESCRIPTION OF PROCEDURE: The patient was brought to the operating room and placed supine on the operating table.  The patient had been signed prior to the procedure and this was documented. The patient had the anesthesia placed by the anesthesiologist.  A time-out was performed to confirm that this was the correct patient, site, side and location. The patient did receive antibiotics prior to the incision and was re-dosed during the procedure as needed at indicated intervals.  The patient was then positioned into the beach chair position with all bony prominences well padded and neutral C spine. The patient had the operative extremity prepped and draped in the standard surgical fashion.    Incisions were made for shoulder arthroscopy portals.  Diagnostic shoulder arthroscopy was first performed which revealed synovitis in the rotator interval.  This was debrided.  Synovitis around the labrum as well.  This was also debrided.  Hemostasis was  obtained.  Glenoid surface demonstrated mild chondromalacia without any full-thickness cartilage defects.  Humeral surface was unremarkable.  Long head of the biceps tendon demonstrated tenosynovitis as well as a longitudinal split tear in the intra-articular portion.  There is also degenerative SLAP tear when probed.  There was mild articular surface tendinosis of the supraspinatus.  This was gently debrided back to stable margins.  The degenerative tear was less than 10% of the thickness.  I then performed a biceps tenotomy with the ArthroCare wand.  The arthroscope was then removed from the shoulder joint.  The portals were closed with nylon sutures.  Separate incision was made approximately 4 cm long in a longitudinal fashion down the arm from the anterior lateral corner of the acromion.  Dissection was carried down through the subcutaneous tissue onto the raphae between the anterior middle deltoid.  This was split in line with the fibers.  The axillary nerve was palpated distal to the division.  The subacromial and subdeltoid bursectomy was performed to expose the subacromial space.  Acromioplasty and subacromial decompression was performed.  The arm was then externally rotated to deliver the bicipital groove.  Transverse humeral ligament was incised on the medial border to deliver the biceps tendon.  Tenolysis of the biceps tendon was performed with tenotomy scissors.  The bone was then abraded with a high-speed bur to promote bone to tendon healing.  Under appropriate tension tenodesis was performed using a 2.9 mm preloaded anchor placed in the bicipital groove just superior to the pectoralis tendon.  A locking Krakw stitch was used to control the tendon.  A second preloaded suture was used to tighten the tendon down to the anchor.  We then anchored the proximal stump of the tendon into  the superior portion of the bicipital groove using a 4.5 mm anchor.  This gave excellent fixation.  The tendon was then  trimmed down so that it would not be too large in the groove.  The transverse humeral ligament was then repaired with interrupted 0 Vicryl.  Assessment of the bursal surface of the cuff showed no tendinopathy or full-thickness tears.  The surgical site was then thoroughly irrigated and closed in a layered fashion.  Sterile dressings were applied.  Shoulder sling was placed.  Patient tolerated procedure well had no many complications. Tessa Lerner, my PA, was a medical necessity for the entirety of the surgery including opening, closing, limb positioning, retracting, exposing, and repairing.  POSTOPERATIVE PLAN: Patient will be discharged home.  He is to wear the sling for comfort.  May begin gentle range of motion as tolerated.  Limit lifting to less than 5 lbs for 6 weeks.  Mayra Reel, MD [facility] 3:09 PM

## 2021-04-01 ENCOUNTER — Encounter (HOSPITAL_BASED_OUTPATIENT_CLINIC_OR_DEPARTMENT_OTHER): Payer: Self-pay | Admitting: Orthopaedic Surgery

## 2021-04-07 ENCOUNTER — Other Ambulatory Visit: Payer: Self-pay

## 2021-04-07 ENCOUNTER — Ambulatory Visit (INDEPENDENT_AMBULATORY_CARE_PROVIDER_SITE_OTHER): Payer: Self-pay | Admitting: Orthopaedic Surgery

## 2021-04-07 DIAGNOSIS — M75101 Unspecified rotator cuff tear or rupture of right shoulder, not specified as traumatic: Secondary | ICD-10-CM

## 2021-04-07 DIAGNOSIS — S46811A Strain of other muscles, fascia and tendons at shoulder and upper arm level, right arm, initial encounter: Secondary | ICD-10-CM

## 2021-04-07 NOTE — Progress Notes (Signed)
Post-Op Visit Note   Patient: Michael Fleming           Date of Birth: 01-27-1971           MRN: [id number] Visit Date: 04/07/2021 PCP: Michael Speed, NP   Assessment & Plan:  Chief Complaint:  Chief Complaint  Patient presents with   Right Shoulder - Routine Post Op    Right shoulder scope 03/31/21   Visit Diagnoses:  1. Nontraumatic tear of supraspinatus tendon, right   2. Partial tear of right subscapularis tendon, initial encounter     Plan: Michael Fleming comes back today 1 week status post right shoulder scope biceps tenodesis.  He is doing well overall has no real concerns or complaints.  Right shoulder incisions are all healed.  No signs of infection or drainage.  Mild swelling throughout the shoulder girdle.  Gentle range of motion well-tolerated.  There is very faint and mild bruising of the upper arm.  No asymmetry or Popeye deformity.  Sutures removed today.  Begin outpatient PT for range of motion.  He is restricted to less than 2 pounds of lifting with the right upper extremity for a total of 6 weeks to protect the tenodesis.  Follow-up in 5 weeks for recheck.  Follow-Up Instructions: Return in about 5 weeks (around 05/12/2021).   Orders:  Orders Placed This Encounter  Procedures   Ambulatory referral to Physical Therapy   No orders of the defined types were placed in this encounter.   Imaging: No results found.  PMFS History: Patient Active Problem List   Diagnosis Date Noted   Tendinopathy of right biceps tendon    Degenerative 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 shoulder    Tendinosis of rotator cuff    Traumatic partial tear of right biceps tendon 03/04/2021   Partial tear of right subscapularis tendon 03/04/2021   Nontraumatic tear of supraspinatus tendon, right 03/04/2021   Hyperlipidemia 02/05/2019   Hypokalemia 02/05/2019   Vitamin D deficiency 02/05/2019   Tick bite of groin 11/27/2018   Back pain 08/06/2018   Blurry  vision 08/06/2018   Pain in right wrist 03/27/2017   Chronic pain of left knee 03/27/2017   HTN (hypertension) 10/23/2016   History of brain surgery 10/23/2016   Chronic headache 10/23/2016   Past Medical History:  Diagnosis Date   Allergy    Biceps tendon tear    right   Heart palpitations 12/2018   Hyperlipidemia    Hypertension    Hypokalemia 12/2018   Seizures (HCC)    last seizure age 4 due to blood clot- 1981 none since     Family History  Problem Relation Age of Onset   Healthy Mother    Hypertension Father    Colon cancer Neg Hx    Colon polyps Neg Hx    Esophageal cancer Neg Hx    Rectal cancer Neg Hx    Stomach cancer Neg Hx     Past Surgical History:  Procedure Laterality Date   BRAIN SURGERY     1981 blot clot removed   FOOT SURGERY     right bunion removed   KNEE ARTHROSCOPY Left    SHOULDER ARTHROSCOPY WITH SUBACROMIAL DECOMPRESSION AND BICEP TENDON REPAIR Right 03/31/2021   Procedure: RIGHT SHOULDER ARTHROSCOPY, EXTENSIVE DEBRIDEMENT, SUBACROMIAL DECOMPRESSION, OPEN BICEPS TENODESIS;  Surgeon: Tarry Kos, MD;  Location: [facility];  Service: Orthopedics;  Laterality: Right;   WRIST SURGERY Left    Social History  Occupational History   Not on file  Tobacco Use   Smoking status: Never   Smokeless tobacco: Never  Vaping Use   Vaping Use: Never used  Substance and Sexual Activity   Alcohol use: Yes    Comment: occasional   Drug use: No   Sexual activity: Not Currently

## 2021-04-13 ENCOUNTER — Other Ambulatory Visit: Payer: Self-pay

## 2021-04-13 ENCOUNTER — Encounter: Payer: Self-pay | Admitting: Physical Therapy

## 2021-04-13 ENCOUNTER — Ambulatory Visit: Payer: Self-pay | Attending: Orthopaedic Surgery | Admitting: Physical Therapy

## 2021-04-13 DIAGNOSIS — M25511 Pain in right shoulder: Secondary | ICD-10-CM | POA: Insufficient documentation

## 2021-04-13 DIAGNOSIS — G8929 Other chronic pain: Secondary | ICD-10-CM | POA: Insufficient documentation

## 2021-04-13 DIAGNOSIS — S46811A Strain of other muscles, fascia and tendons at shoulder and upper arm level, right arm, initial encounter: Secondary | ICD-10-CM | POA: Insufficient documentation

## 2021-04-13 DIAGNOSIS — R600 Localized edema: Secondary | ICD-10-CM | POA: Insufficient documentation

## 2021-04-13 DIAGNOSIS — M6281 Muscle weakness (generalized): Secondary | ICD-10-CM | POA: Insufficient documentation

## 2021-04-13 DIAGNOSIS — M75101 Unspecified rotator cuff tear or rupture of right shoulder, not specified as traumatic: Secondary | ICD-10-CM | POA: Insufficient documentation

## 2021-04-13 NOTE — Therapy (Signed)
[name] Outpatient Rehabilitation [facility] [address] [place], [place], [address] Phone: [phone]   Fax:  [phone]  Physical Therapy Evaluation  Patient Details  Name: Michael Fleming MRN: [id number] Date of Birth: 07-Jul-1970 Referring Provider (PT): Gershon Mussel   Encounter Date: 04/13/2021   PT End of Session - 04/13/21 1429     Visit Number 1    Number of Visits 17    Date for PT Re-Evaluation 06/08/21    Authorization Type Self pay    PT Start Time 1330    PT Stop Time 1417    PT Time Calculation (min) 47 min    Activity Tolerance Patient tolerated treatment well    Behavior During Therapy [facility] for tasks assessed/performed             Past Medical History:  Diagnosis Date   Allergy    Biceps tendon tear    right   Heart palpitations 12/2018   Hyperlipidemia    Hypertension    Hypokalemia 12/2018   Seizures (HCC)    last seizure age 89 due to blood clot- 1981 none since     Past Surgical History:  Procedure Laterality Date   BRAIN SURGERY     1981 blot clot removed   FOOT SURGERY     right bunion removed   KNEE ARTHROSCOPY Left    SHOULDER ARTHROSCOPY WITH SUBACROMIAL DECOMPRESSION AND BICEP TENDON REPAIR Right 03/31/2021   Procedure: RIGHT SHOULDER ARTHROSCOPY, EXTENSIVE DEBRIDEMENT, SUBACROMIAL DECOMPRESSION, OPEN BICEPS TENODESIS;  Surgeon: Tarry Kos, MD;  Location: [facility];  Service: Orthopedics;  Laterality: Right;   WRIST SURGERY Left     There were no vitals filed for this visit.    Subjective Assessment - 04/13/21 1337     Subjective pt is s/p R shoulder DCR, SAD, and bicep tenodesis on 03/31/2021. He reports overall he has been doing well but does report soreness at night. Pain stays mostly in the shoulder and radiates down into the elbow. he rpoerts some altered sensation located in the 1st-3rd digits.    How long can you sit comfortably? unlimited    How long can you stand comfortably?  unlimited    How long can you walk comfortably? unlimited    Patient Stated Goals to get back to fishing,    Currently in Pain? Yes    Pain Score 3    worst 8/10   Pain Location Shoulder    Pain Orientation Right    Pain Descriptors / Indicators Throbbing    Pain Type Chronic pain    Pain Radiating Towards altered sensation noted into the 1st 2nd and 3rd digits in the RUE    Pain Onset More than a month ago    Pain Frequency Intermittent    Aggravating Factors  laying down, leaning on to the R shoulder, using the R arm    Pain Relieving Factors getting out of position, medication    Effect of Pain on Daily Activities limited RUE uage                [facility] PT Assessment - 04/13/21 0001       Assessment   Medical Diagnosis Nontraumatic tear of supraspinatus tendon, right (M75.101), Partial tear of right subscapularis tendon, initial encounter (J94.174Y)    Referring Provider (PT) Roda Shutters Naiping    Onset Date/Surgical Date 03/31/21    Hand Dominance Right    Next MD Visit unsure    Prior Therapy no  Precautions   Precautions Shoulder    Precaution Comments no lifting over 5 #      Restrictions   Weight Bearing Restrictions Yes    RUE Weight Bearing --   nothing over 5 lbs x 6 weeks     Balance Screen   Has the patient fallen in the past 6 months No      Home Environment   Living Environment Private residence    Living Arrangements Spouse/significant other    Available Help at Discharge Family    Type of Home House    Home Access Level entry    Home Layout Two level    Alternate Level Stairs-Number of Steps 5    Alternate Level Stairs-Rails Left   decending   Home Equipment --   sling     Prior Function   Level of Independence Independent    Vocation Part time employment   [employer]   Vocation Requirements prlonged standing/ sitting    Leisure fishig,      Cognition   Overall Cognitive Status Within Functional Limits for tasks assessed      Observation/Other  Assessments   Observations mild bursing noted on the distal bicep.    Skin Integrity incision appears clean, dry and healing well.    Focus on Therapeutic Outcomes (FOTO)  38%   70%predicted     ROM / Strength   AROM / PROM / Strength AROM;Strength;PROM      AROM   AROM Assessment Site Shoulder    Right/Left Shoulder Right;Left    Right Shoulder Extension 50 Degrees    Right Shoulder Flexion 35 Degrees    Right Shoulder ABduction 50 Degrees    Right Shoulder Internal Rotation --   parietal   Right Shoulder External Rotation --   R SIJ   Left Shoulder Extension 72 Degrees    Left Shoulder Flexion 155 Degrees    Left Shoulder ABduction 130 Degrees    Left Shoulder Internal Rotation --   T8   Left Shoulder External Rotation --   T4     PROM   PROM Assessment Site Shoulder    Right/Left Shoulder Right;Left    Right Shoulder Flexion 85 Degrees    Right Shoulder ABduction 85 Degrees    Right Shoulder Internal Rotation 52 Degrees   in 45 deg of abd   Right Shoulder External Rotation 32 Degrees   in 45 deg of abd     Strength   Overall Strength Comments held MMT of the R shoulder due to precautions    Strength Assessment Site Shoulder;Hand    Right/Left Shoulder Right;Left    Left Shoulder Flexion 5/5    Left Shoulder Extension 5/5    Left Shoulder ABduction 5/5    Left Shoulder Internal Rotation 5/5    Left Shoulder External Rotation 5/5    Right Hand Grip (lbs) 97   105,93,93   Left Hand Grip (lbs) 106.6   110,109,101     Palpation   Palpation comment TTP along the deltoid, teres minor and infraspinatus, upper trap and biceps brachii                        Objective measurements completed on examination: See above findings.                PT Education - 04/13/21 1422     Education Details evaluation findings, POC, goals, HEP with proper form    Person(s) Educated  Patient    Methods Explanation;Verbal cues;Handout    Comprehension Verbalized  understanding;Verbal cues required              PT Short Term Goals - 04/13/21 1425       PT SHORT TERM GOAL #1   Title pt to be IND with initial HEP    Time 4    Period Weeks    Status New    Target Date 05/11/21      PT SHORT TERM GOAL #2   Title increase R grip strength to >/= 105# to demo improvement in shoulder function    Time 4    Period Weeks    Status New    Target Date 05/11/21      PT SHORT TERM GOAL #3   Title pt to increase R shoulder PROM flexion/ abduction to >/= 110 degrees for therapuetic progression    Time 4    Period Weeks    Status New    Target Date 05/11/21               PT Long Term Goals - 04/13/21 1427       PT LONG TERM GOAL #1   Title increase R shoulder active flexion/ abduction to >/= 120 degrees and IR/ER WFL compared bil for funcitonal shoulder ROM with </= 2/10 max pain    Time 8    Period Weeks    Status New    Target Date 06/08/21      PT LONG TERM GOAL #2   Title increase R shoulder gross strength to >/= 4+/5 to assist with general lifting and carrying ADLs    Time 8    Period Weeks    Status New    Target Date 06/08/21      PT LONG TERM GOAL #3   Title pt to be able to return to his hobby of fishing with </= 2/10 max pain per personal goals    Time 8    Period Weeks    Status New    Target Date 06/08/21      PT LONG TERM GOAL #4   Title increase FOTO score to >/= 70% to demo improvement in function    Time 8    Period Weeks    Status New    Target Date 06/08/21      PT LONG TERM GOAL #5   Title pt to be IND with all HEP to be able to maintain and progress current LOF IND    Time 8    Period Weeks    Status New    Target Date 06/08/21                    Plan - 04/13/21 1351     Clinical Impression Statement pt is a pleasant 50 y.o M presenting to OPPT s/p R DCR, SAD and bicep tenodesis on 03/31/2021. He demonstrates limited AROM and PROM secondary to pain and guarding . Held MMT due to  precautions of no lifting with RUE over 5# for 6 weeks. TTP noted along the lateral deltoid, teres minor and upper trap/ levator scapuale. He would benefit from physical therapy to decreaes. He would benefit from physical therapy to decrease R shoulder pain, improve shoulder ROM, increase strength, and maximize his function by addressing the deficits listed.    Stability/Clinical Decision Making Stable/Uncomplicated    Clinical Decision Making Low    PT Frequency 2x / week  PT Duration 8 weeks    PT Treatment/Interventions ADLs/Self Care Home Management;Cryotherapy;Electrical Stimulation;Iontophoresis 4mg /ml Dexamethasone;Moist Heat;Ultrasound;Balance training;Therapeutic exercise;Therapeutic activities;Neuromuscular re-education;Patient/family education;Manual techniques;Passive range of motion;Dry needling;Taping;Vasopneumatic Device    PT Next Visit Plan review/ update HEP PRN. no lifting in the RUE over 5# for 6 weeks. gross shoulder ROM, scapular stability, modalities for pain PRN.    PT Home Exercise Plan [id number]    Consulted and Agree with Plan of Care Patient             Patient will benefit from skilled therapeutic intervention in order to improve the following deficits and impairments:  Pain, Improper body mechanics, Increased muscle spasms, Decreased strength, Postural dysfunction, Decreased endurance, Decreased activity tolerance, Decreased range of motion, Increased edema  Visit Diagnosis: Chronic right shoulder pain  Muscle weakness (generalized)  Localized edema     Problem List Patient Active Problem List   Diagnosis Date Noted   Tendinopathy of right biceps tendon    Degenerative 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 shoulder    Tendinosis of rotator cuff    Traumatic partial tear of right biceps tendon 03/04/2021   Partial tear of right subscapularis tendon 03/04/2021   Nontraumatic tear of supraspinatus tendon, right 03/04/2021    Hyperlipidemia 02/05/2019   Hypokalemia 02/05/2019   Vitamin D deficiency 02/05/2019   Tick bite of groin 11/27/2018   Back pain 08/06/2018   Blurry vision 08/06/2018   Pain in right wrist 03/27/2017   Chronic pain of left knee 03/27/2017   HTN (hypertension) 10/23/2016   History of brain surgery 10/23/2016   Chronic headache 10/23/2016   12/23/2016 PT, DPT, LAT, ATC  04/13/21  2:31 PM      [facility] Outpatient [facility] [address] [place], [place], [place] Phone: [phone]   Fax:  [phone]  Name: Michael Fleming MRN: Desmond Lope Date of Birth: 08/20/1970

## 2021-04-20 ENCOUNTER — Other Ambulatory Visit: Payer: Self-pay

## 2021-04-20 ENCOUNTER — Ambulatory Visit: Payer: Self-pay | Admitting: Physical Therapy

## 2021-04-20 ENCOUNTER — Encounter: Payer: Self-pay | Admitting: Physical Therapy

## 2021-04-20 DIAGNOSIS — M6281 Muscle weakness (generalized): Secondary | ICD-10-CM

## 2021-04-20 DIAGNOSIS — G8929 Other chronic pain: Secondary | ICD-10-CM

## 2021-04-20 DIAGNOSIS — R600 Localized edema: Secondary | ICD-10-CM

## 2021-04-20 NOTE — Therapy (Signed)
[facility] Outpatient Rehabilitation [facility] [address] [place], [place], [address] Phone: [phone]   Fax:  [phone]  Physical Therapy Treatment  Patient Details  Name: Michael Fleming MRN: [id number] Date of Birth: 02/12/1971 Referring Provider (PT): Gershon Mussel   Encounter Date: 04/20/2021   PT End of Session - 04/20/21 1202     Visit Number 2    Number of Visits 17    Date for PT Re-Evaluation 06/08/21    Authorization Type Self pay    PT Start Time 1158    PT Stop Time 1227    PT Time Calculation (min) 29 min             Past Medical History:  Diagnosis Date   Allergy    Biceps tendon tear    right   Heart palpitations 12/2018   Hyperlipidemia    Hypertension    Hypokalemia 12/2018   Seizures (HCC)    last seizure age 58 due to blood clot- 1981 none since     Past Surgical History:  Procedure Laterality Date   BRAIN SURGERY     1981 blot clot removed   FOOT SURGERY     right bunion removed   KNEE ARTHROSCOPY Left    SHOULDER ARTHROSCOPY WITH SUBACROMIAL DECOMPRESSION AND BICEP TENDON REPAIR Right 03/31/2021   Procedure: RIGHT SHOULDER ARTHROSCOPY, EXTENSIVE DEBRIDEMENT, SUBACROMIAL DECOMPRESSION, OPEN BICEPS TENODESIS;  Surgeon: Tarry Kos, MD;  Location: [facility];  Service: Orthopedics;  Laterality: Right;   WRIST SURGERY Left     There were no vitals filed for this visit.   Subjective Assessment - 04/20/21 1200     Subjective Low pain at rest. Can be 10/10 at night with trying to get comfortable. Pain still in elbow as well.    Currently in Pain? Yes    Pain Score 3     Pain Location Shoulder    Pain Orientation Right    Pain Descriptors / Indicators Throbbing    Pain Type Chronic pain    Aggravating Factors  laying down, leaning on to the right shoulder, using arm    Pain Relieving Factors getting out of position, meds                [facility] PT Assessment - 04/20/21 0001       AROM    Right Shoulder Flexion 145 Degrees    Right Shoulder ABduction 110 Degrees    Right Shoulder Internal Rotation --   L5   Right Shoulder External Rotation --   can touch T2            [facility] Adult PT Treatment/Exercise:  Therapeutic Exercise: - Pulleys seated flexion x scaption x 2 minutes each - supine cane  chest press and pullovers x 10 each  -Supine AAROM ER with dowel   Manual Therapy: - GHJ mobs and PROM to tolerance for flexion, abduction, ER, IR to tolerance        PT Short Term Goals - 04/13/21 1425       PT SHORT TERM GOAL #1   Title pt to be IND with initial HEP    Time 4    Period Weeks    Status New    Target Date 05/11/21      PT SHORT TERM GOAL #2   Title increase R grip strength to >/= 105# to demo improvement in shoulder function    Time 4    Period Weeks  Status New    Target Date 05/11/21      PT SHORT TERM GOAL #3   Title pt to increase R shoulder PROM flexion/ abduction to >/= 110 degrees for therapuetic progression    Time 4    Period Weeks    Status New    Target Date 05/11/21               PT Long Term Goals - 04/13/21 1427       PT LONG TERM GOAL #1   Title increase R shoulder active flexion/ abduction to >/= 120 degrees and IR/ER WFL compared bil for funcitonal shoulder ROM with </= 2/10 max pain    Time 8    Period Weeks    Status New    Target Date 06/08/21      PT LONG TERM GOAL #2   Title increase R shoulder gross strength to >/= 4+/5 to assist with general lifting and carrying ADLs    Time 8    Period Weeks    Status New    Target Date 06/08/21      PT LONG TERM GOAL #3   Title pt to be able to return to his hobby of fishing with </= 2/10 max pain per personal goals    Time 8    Period Weeks    Status New    Target Date 06/08/21      PT LONG TERM GOAL #4   Title increase FOTO score to >/= 70% to demo improvement in function    Time 8    Period Weeks    Status New    Target Date 06/08/21      PT LONG  TERM GOAL #5   Title pt to be IND with all HEP to be able to maintain and progress current LOF IND    Time 8    Period Weeks    Status New    Target Date 06/08/21                   Plan - 04/20/21 1205     Clinical Impression Statement Pt reports improvement in motion and demonstrates improved AROM measures. He reports continued difficulty with comfort while sleeping and pain can reach 10/10 at night in shoulder and elbow. Progressed pulleys and supine AAROM. Performed manual and PROM to increased shoulder ROM. Pt tolerated session well with pain at end range stretching.    PT Treatment/Interventions ADLs/Self Care Home Management;Cryotherapy;Electrical Stimulation;Iontophoresis 4mg /ml Dexamethasone;Moist Heat;Ultrasound;Balance training;Therapeutic exercise;Therapeutic activities;Neuromuscular re-education;Patient/family education;Manual techniques;Passive range of motion;Dry needling;Taping;Vasopneumatic Device    PT Next Visit Plan review/ update HEP PRN. no lifting in the RUE over 5# for 6 weeks. gross shoulder ROM, Begin scapular stability, modalities for pain PRN.    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    Tendinosis of rotator cuff    Traumatic partial tear of right biceps tendon 03/04/2021  Partial tear of right subscapularis tendon 03/04/2021   Nontraumatic tear of  supraspinatus tendon, right 03/04/2021   Hyperlipidemia 02/05/2019   Hypokalemia 02/05/2019   Vitamin D deficiency 02/05/2019   Tick bite of groin 11/27/2018   Back pain 08/06/2018   Blurry vision 08/06/2018   Pain in right wrist 03/27/2017   Chronic pain of left knee 03/27/2017   HTN (hypertension) 10/23/2016   History of brain surgery 10/23/2016   Chronic headache 10/23/2016    Sherrie Mustache, PTA 04/20/2021, 12:44 PM  [facility] Outpatient Rehabilitation [facility] [address] [place], [place], [address] Phone: [phone]   Fax:  [phone]  Name: SHEPARD KELTZ MRN: [id number] Date of Birth: 1971/03/30

## 2021-04-24 ENCOUNTER — Ambulatory Visit: Payer: Self-pay

## 2021-04-29 ENCOUNTER — Other Ambulatory Visit: Payer: Self-pay

## 2021-04-29 ENCOUNTER — Encounter: Payer: Self-pay | Admitting: Physical Therapy

## 2021-04-29 ENCOUNTER — Ambulatory Visit: Payer: Self-pay | Attending: Orthopaedic Surgery | Admitting: Physical Therapy

## 2021-04-29 DIAGNOSIS — M6281 Muscle weakness (generalized): Secondary | ICD-10-CM | POA: Insufficient documentation

## 2021-04-29 DIAGNOSIS — R600 Localized edema: Secondary | ICD-10-CM | POA: Insufficient documentation

## 2021-04-29 DIAGNOSIS — M25511 Pain in right shoulder: Secondary | ICD-10-CM | POA: Insufficient documentation

## 2021-04-29 DIAGNOSIS — G8929 Other chronic pain: Secondary | ICD-10-CM | POA: Insufficient documentation

## 2021-04-29 NOTE — Therapy (Signed)
[facility] [place], [place], [address] Phone: [phone]   Fax:  [phone]  Physical Therapy Treatment  Patient Details  Name: Michael Fleming MRN: [id number] Date of Birth: [birth year] Referring Provider (PT): Michael Fleming   Encounter Date: 04/29/2021   PT End of Session - 04/29/21 1401     Visit Number 3    Number of Visits 17    Date for PT Re-Evaluation 06/08/21    Authorization Type Self pay    PT Start Time 1400    PT Stop Time 1430    PT Time Calculation (min) 30 min             Past Medical History:  Diagnosis Date   Allergy    Biceps tendon tear    right   Heart palpitations 12/2018   Hyperlipidemia    Hypertension    Hypokalemia 12/2018   Seizures ([facility])    last seizure age 66 due to blood clot- 1981 none since     Past Surgical History:  Procedure Laterality Date   BRAIN SURGERY     1981 blot clot removed   FOOT SURGERY     right bunion removed   KNEE ARTHROSCOPY Left    SHOULDER ARTHROSCOPY WITH SUBACROMIAL DECOMPRESSION AND BICEP TENDON REPAIR Right 03/31/2021   Procedure: RIGHT SHOULDER ARTHROSCOPY, EXTENSIVE DEBRIDEMENT, SUBACROMIAL DECOMPRESSION, OPEN BICEPS TENODESIS;  Surgeon: Leandrew Koyanagi, MD;  Location: [facility];  Service: Orthopedics;  Laterality: Right;   WRIST SURGERY Left     There were no vitals filed for this visit.   Subjective Assessment - 04/29/21 1359     Subjective The arm will not let me sleep.    Currently in Pain? No/denies                [facility] PT Assessment - 04/29/21 0001       AROM   Right Shoulder Flexion 150 Degrees    Right Shoulder ABduction 110 Degrees      PROM   Right Shoulder Flexion 140 Degrees    Right Shoulder ABduction 120 Degrees    Right Shoulder External Rotation 40 Degrees              [facility] Adult PT Treatment/Exercise:  Therapeutic Exercise: - Pulleys seated flexion x scaption x 2 minutes  each -seated AAROM cane ER/IR elbow at side -seated scap retractions x 10  - supine cane  chest press and pullovers x 10 each  -supine cane horizontal abduction and adduction x 10  -Supine AAROM ER with dowel  -  Manual Therapy: - GHJ mobs and PROM to tolerance for flexion, abduction, ER, IR to tolerance , also horizontal adduction /post capsule stretch gently.      PT Short Term Goals - 04/29/21 1447       PT SHORT TERM GOAL #1   Title pt to be IND with initial HEP    Time 4    Period Weeks    Status Achieved    Target Date 05/11/21      PT SHORT TERM GOAL #2   Title increase R grip strength to >/= 105# to demo improvement in shoulder function    Time 4    Period Weeks    Status Unable to assess      PT SHORT TERM GOAL #3   Title pt to increase R shoulder PROM flexion/ abduction to >/= 110 degrees for therapuetic progression  Time 4    Period Weeks    Status Achieved               PT Long Term Goals - 04/13/21 1427       PT LONG TERM GOAL #1   Title increase R shoulder active flexion/ abduction to >/= 120 degrees and IR/ER WFL compared bil for funcitonal shoulder ROM with </= 2/10 max pain    Time 8    Period Weeks    Status New    Target Date 06/08/21      PT LONG TERM GOAL #2   Title increase R shoulder gross strength to >/= 4+/5 to assist with general lifting and carrying ADLs    Time 8    Period Weeks    Status New    Target Date 06/08/21      PT LONG TERM GOAL #3   Title pt to be able to return to his hobby of fishing with </= 2/10 max pain per personal goals    Time 8    Period Weeks    Status New    Target Date 06/08/21      PT LONG TERM GOAL #4   Title increase FOTO score to >/= 70% to demo improvement in function    Time 8    Period Weeks    Status New    Target Date 06/08/21      PT LONG TERM GOAL #5   Title pt to be IND with all HEP to be able to maintain and progress current LOF IND    Time 8    Period Weeks    Status New     Target Date 06/08/21                   Plan - 04/29/21 1436     Clinical Impression Statement Michael Fleming reports no pain on arrival and improving AROM. He does report continued difficulty and increased pain with trying to sleep, especially when his arm moves into horizontal adduction. Continued with ROM focus as he is only 3 weeks post op. He has discontinued his sling and is unsure how long he was to wear it after surgery. He is aware of his lifting restrictions. His AROM and PROM have improved. He is independent with his HEP. LTG#1, #3 met.    PT Treatment/Interventions ADLs/Self Care Home Management;Cryotherapy;Electrical Stimulation;Iontophoresis 44m/ml Dexamethasone;Moist Heat;Ultrasound;Balance training;Therapeutic exercise;Therapeutic activities;Neuromuscular re-education;Patient/family education;Manual techniques;Passive range of motion;Dry needling;Taping;Vasopneumatic Device    PT Next Visit Plan review/ update HEP PRN. no lifting in the RUE over 5# for 6 weeks. gross shoulder ROM, Begin scapular stability, modalities for pain PRN.    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    Tendinosis of rotator cuff    Traumatic partial tear of right biceps tendon 03/04/2021   Partial tear of right subscapularis tendon 03/04/2021   Nontraumatic tear of supraspinatus tendon, right  03/04/2021   Hyperlipidemia 02/05/2019   Hypokalemia 02/05/2019   Vitamin D deficiency 02/05/2019   Tick bite of groin 11/27/2018   Back pain 08/06/2018   Blurry vision 08/06/2018   Pain in right wrist 03/27/2017   Chronic pain of left knee 03/27/2017   HTN (hypertension) 10/23/2016   History of brain surgery 10/23/2016   Chronic headache 10/23/2016    Dorene Ar, PTA 04/29/2021, 2:50 PM  [facility] [address] [place], [place], [address] Phone: [phone]   Fax:  [phone]  Name: Michael Fleming MRN: [id number] Date of Birth: 22-Feb-1971

## 2021-05-01 ENCOUNTER — Ambulatory Visit: Payer: Self-pay | Admitting: Physical Therapy

## 2021-05-01 ENCOUNTER — Other Ambulatory Visit: Payer: Self-pay

## 2021-05-01 ENCOUNTER — Encounter: Payer: Self-pay | Admitting: Physical Therapy

## 2021-05-01 DIAGNOSIS — G8929 Other chronic pain: Secondary | ICD-10-CM

## 2021-05-01 DIAGNOSIS — M6281 Muscle weakness (generalized): Secondary | ICD-10-CM

## 2021-05-01 DIAGNOSIS — R600 Localized edema: Secondary | ICD-10-CM
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
## 2021-05-04 ENCOUNTER — Other Ambulatory Visit: Payer: Self-pay

## 2021-05-04 ENCOUNTER — Ambulatory Visit: Payer: Self-pay | Admitting: Physical Therapy

## 2021-05-04 DIAGNOSIS — G8929 Other chronic pain: Secondary | ICD-10-CM

## 2021-05-04 DIAGNOSIS — R600 Localized edema: Secondary | ICD-10-CM

## 2021-05-04 DIAGNOSIS — M6281 Muscle weakness (generalized): Secondary | ICD-10-CM

## 2021-05-04 MED FILL — Losartan Potassium Tab 50 MG: ORAL | 30 days supply | Qty: 30 | Fill #3 | Status: AC

## 2021-05-04 MED FILL — Pravastatin Sodium Tab 80 MG: ORAL | 30 days supply | Qty: 30 | Fill #3 | Status: AC

## 2021-05-04 MED FILL — Amlodipine Besylate Tab 10 MG (Base Equivalent): ORAL | 30 days supply | Qty: 30 | Fill #3 | Status: AC
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
## 2021-05-04 NOTE — Patient Instructions (Signed)
Access Code: [email or web address] URL: [email or web address] Date: 05/04/2021 Prepared by: Jannette Spanner  Exercises Seated Upper Trapezius Stretch - 2 x daily - 7 x weekly - 2 sets - 2 reps - 30 seconds hold Shoulder table slides ABD - 2 x daily - 3 sets - 10 reps - 5 hold Shoulder Table Slides Flexion - 2 x daily - 3 sets - 10 reps - 5 hold Supine Shoulder Flexion with Dowel - 1 x daily - 7 x weekly - 2 sets - 10 reps Seated Scapular Retraction - 1 x daily - 7 x weekly - 2 sets - 10 reps - 5 seconds hold Supine Shoulder Protraction with Dowel - 1 x daily - 7 x weekly - 2 sets - 10 reps Shoulder Flexion Wall Slide with Towel - 1 x daily - 7 x weekly - 2 sets - 10 reps Standing Shoulder Abduction Slides at Wall - 1 x daily - 7 x weekly - 2 sets - 10 reps

## 2021-05-06 ENCOUNTER — Ambulatory Visit: Payer: Self-pay | Admitting: Physical Therapy

## 2021-05-06 ENCOUNTER — Encounter: Payer: Self-pay | Admitting: Physical Therapy

## 2021-05-06 ENCOUNTER — Other Ambulatory Visit: Payer: Self-pay

## 2021-05-06 DIAGNOSIS — M6281 Muscle weakness (generalized): Secondary | ICD-10-CM

## 2021-05-06 DIAGNOSIS — G8929 Other chronic pain: Secondary | ICD-10-CM

## 2021-05-06 DIAGNOSIS — R600 Localized edema: Secondary | ICD-10-CM

## 2021-05-06 NOTE — Therapy (Signed)
[facility] [address] [place], [place], [address] Phone: [phone]   Fax:  [phone]  Physical Therapy Treatment  Patient Details  Name: Michael Fleming MRN: [id number] Date of Birth: July 28, 1970 Referring Provider (PT): Gershon Mussel   Encounter Date: 05/06/2021   PT End of Session - 05/06/21 1335     Visit Number 6    Number of Visits 17    Date for PT Re-Evaluation 06/08/21    PT Start Time 1332    PT Stop Time 1415    PT Time Calculation (min) 43 min    Activity Tolerance Patient tolerated treatment well    Behavior During Therapy [facility] for tasks assessed/performed             Past Medical History:  Diagnosis Date   Allergy    Biceps tendon tear    right   Heart palpitations 12/2018   Hyperlipidemia    Hypertension    Hypokalemia 12/2018   Seizures ([facility])    last seizure age 10 due to blood clot- 1981 none since     Past Surgical History:  Procedure Laterality Date   BRAIN SURGERY     1981 blot clot removed   FOOT SURGERY     right bunion removed   KNEE ARTHROSCOPY Left    SHOULDER ARTHROSCOPY WITH SUBACROMIAL DECOMPRESSION AND BICEP TENDON REPAIR Right 03/31/2021   Procedure: RIGHT SHOULDER ARTHROSCOPY, EXTENSIVE DEBRIDEMENT, SUBACROMIAL DECOMPRESSION, OPEN BICEPS TENODESIS;  Surgeon: Tarry Kos, MD;  Location: [facility];  Service: Orthopedics;  Laterality: Right;   WRIST SURGERY Left     There were no vitals filed for this visit.   Subjective Assessment - 05/06/21 1336     Subjective " I still have some soreness with reaching backward to adjust the covers at night. today pain is about a 2/10"    Patient Stated Goals to get back to fishing,    Currently in Pain? Yes    Pain Score 2     Pain Location Shoulder    Pain Orientation Right    Pain Descriptors / Indicators Aching;Sore    Pain Type Chronic pain                [facility] PT Assessment - 05/06/21 0001        Observation/Other Assessments   Focus on Therapeutic Outcomes (FOTO)  57%                          [facility] Adult PT Treatment:     05/06/2021 Therapeutic Exercise: UBE L3 x 4 min (2 min fwd/bwd) Bicep stretching 2 x 30 (manually) Posterior capsule stretch 2 x 30 sec Supine wand chest press dowel 2 x 10 Short lever AAROM shoulder flexion 2 x 10  Manual Therapy:  IASTM along the bicep brachii, delotid and tricep  Trigger Point Dry-Needling  05/06/2021 Treatment instructions: Expect mild to moderate muscle soreness. Signs /symptoms of pneumothorax if dry needled over a lung field, and to seek immediate medical attention should they occur. Patient verbalized understanding of these instructions and education.  Patient Consent Given: Yes Education handout provided: Yes Muscles treated: middle deltoid, proximal triceps and mid bicep brachii Electrical stimulation performed: No Parameters: N/A Treatment response/outcome: Twitch response elicited and Palpable decrease in muscle tension              PT Education - 05/06/21 1404     Education Details Reviewed  HEP and FOTO assessment    Person(s) Educated Patient    Methods Explanation    Comprehension Verbalized understanding              PT Short Term Goals - 05/04/21 1438       PT SHORT TERM GOAL #1   Title pt to be IND with initial HEP    Time 4    Period Weeks    Status Achieved    Target Date 05/11/21      PT SHORT TERM GOAL #2   Title increase R grip strength to >/= 105# to demo improvement in shoulder function    Baseline 110# avg    Time 4    Period Weeks    Status Achieved      PT SHORT TERM GOAL #3   Title pt to increase R shoulder PROM flexion/ abduction to >/= 110 degrees for therapuetic progression    Time 4    Period Weeks    Status Achieved    Target Date 05/11/21               PT Long Term Goals - 04/13/21 1427       PT LONG TERM GOAL #1   Title increase R  shoulder active flexion/ abduction to >/= 120 degrees and IR/ER WFL compared bil for funcitonal shoulder ROM with </= 2/10 max pain    Time 8    Period Weeks    Status New    Target Date 06/08/21      PT LONG TERM GOAL #2   Title increase R shoulder gross strength to >/= 4+/5 to assist with general lifting and carrying ADLs    Time 8    Period Weeks    Status New    Target Date 06/08/21      PT LONG TERM GOAL #3   Title pt to be able to return to his hobby of fishing with </= 2/10 max pain per personal goals    Time 8    Period Weeks    Status New    Target Date 06/08/21      PT LONG TERM GOAL #4   Title increase FOTO score to >/= 70% to demo improvement in function    Time 8    Period Weeks    Status New    Target Date 06/08/21      PT LONG TERM GOAL #5   Title pt to be IND with all HEP to be able to maintain and progress current LOF IND    Time 8    Period Weeks    Status New    Target Date 06/08/21                   Plan - 05/06/21 1407     Clinical Impression Statement pt arrives to session noting some soreness along the bicep secondary to quick movments reaching backward. Educated and consent was given for TPDN for the biceps, middle deltoid, and tricep followed with IASTM techiques. continued working on shoulder AAROM and gentle strengthening which he did well with. verbal cues for slow controlled motions vs quick movements to avoid and potential quick stretch.    PT Treatment/Interventions ADLs/Self Care Home Management;Cryotherapy;Electrical Stimulation;Iontophoresis 4mg /ml Dexamethasone;Moist Heat;Ultrasound;Balance training;Therapeutic exercise;Therapeutic activities;Neuromuscular re-education;Patient/family education;Manual techniques;Passive range of motion;Dry needling;Taping;Vasopneumatic Device    PT Next Visit Plan review/ update HEP PRN. Response to DN. no lifting in the RUE over 2# for 6 weeks. gross shoulder  ROM, Begin scapular stability, modalities  for pain PRN.    PT Home Exercise Plan [id number]    Consulted and Agree with Plan of Care Patient             Patient will benefit from skilled therapeutic intervention in order to improve the following deficits and impairments:  Pain, Improper body mechanics, Increased muscle spasms, Decreased strength, Postural dysfunction, Decreased endurance, Decreased activity tolerance, Decreased range of motion, Increased edema  Visit Diagnosis: Chronic right shoulder pain  Muscle weakness (generalized)  Localized edema     Problem List Patient Active Problem List   Diagnosis Date Noted   Tendinopathy of right biceps tendon    Degenerative 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 shoulder    Tendinosis of rotator cuff    Traumatic partial tear of right biceps tendon 03/04/2021   Partial tear of right subscapularis tendon 03/04/2021   Nontraumatic tear of supraspinatus tendon, right 03/04/2021   Hyperlipidemia 02/05/2019   Hypokalemia 02/05/2019   Vitamin D deficiency 02/05/2019   Tick bite of groin 11/27/2018   Back pain 08/06/2018   Blurry vision 08/06/2018   Pain in right wrist 03/27/2017   Chronic pain of left knee 03/27/2017   HTN (hypertension) 10/23/2016   History of brain surgery 10/23/2016   Chronic headache 10/23/2016     Lulu Riding PT, DPT, LAT, ATC  05/06/21  2:18 PM     [facility] [address] [place], [place], [address] Phone: [phone]   Fax:  [phone]  Name: Michael Fleming MRN: [id number] Date of Birth: 1970/12/14

## 2021-05-11 ENCOUNTER — Encounter: Payer: Self-pay | Admitting: Physical Therapy

## 2021-05-11 ENCOUNTER — Ambulatory Visit: Payer: Self-pay | Admitting: Physical Therapy

## 2021-05-11 ENCOUNTER — Other Ambulatory Visit: Payer: Self-pay

## 2021-05-11 DIAGNOSIS — G8929 Other chronic pain: Secondary | ICD-10-CM

## 2021-05-11 DIAGNOSIS — R600 Localized edema: Secondary | ICD-10-CM

## 2021-05-11 DIAGNOSIS — M6281 Muscle weakness (generalized): Secondary | ICD-10-CM

## 2021-05-11 NOTE — Therapy (Addendum)
[facility] Outpatient Rehabilitation [facility] [address] [place], [place], [address] Phone: [phone]   Fax:  [phone]  Physical Therapy Treatment  Patient Details  Name: Michael Fleming MRN: [id number] Date of Birth: [birth year] Referring Provider (PT): Gershon Mussel   Encounter Date: 05/11/2021   PT End of Session - 05/11/21 1341     Visit Number 7    Number of Visits 17    Date for PT Re-Evaluation 06/08/21    Authorization Type Self pay    PT Start Time 1317    PT Stop Time 1355    PT Time Calculation (min) 38 min             Past Medical History:  Diagnosis Date   Allergy    Biceps tendon tear    right   Heart palpitations 12/2018   Hyperlipidemia    Hypertension    Hypokalemia 12/2018   Seizures (HCC)    last seizure age 7 due to blood clot- 1981 none since     Past Surgical History:  Procedure Laterality Date   BRAIN SURGERY     1981 blot clot removed   FOOT SURGERY     right bunion removed   KNEE ARTHROSCOPY Left    SHOULDER ARTHROSCOPY WITH SUBACROMIAL DECOMPRESSION AND BICEP TENDON REPAIR Right 03/31/2021   Procedure: RIGHT SHOULDER ARTHROSCOPY, EXTENSIVE DEBRIDEMENT, SUBACROMIAL DECOMPRESSION, OPEN BICEPS TENODESIS;  Surgeon: Tarry Kos, MD;  Location: [facility];  Service: Orthopedics;  Laterality: Right;   WRIST SURGERY Left     There were no vitals filed for this visit.   Subjective Assessment - 05/11/21 1318     Subjective " I was sore after the needling for a couple of days no overall change"    Currently in Pain? Yes    Pain Score 3     Pain Location Shoulder    Pain Orientation Right    Pain Descriptors / Indicators Sore    Pain Type Chronic pain    Aggravating Factors  sleep    Pain Relieving Factors change positions                        Treatment : 05/04/21 Manual: IASTM to right shoulder scar and surrounding, scar mobilization/cross friction, desensitization, passive  ROM right shoulder flexion, abduction, IR, ER   Therapeutic exercise:   - supine cane  chest press and pullovers x 10 each  -supine  cane ER/IR AAROM  Supine flexion- long arm AROM x15 Side lying shoulder abduction AROM x15 Side lying ER AROM x15 -pulleys for flexion and abduction  -standing AAROM wall slide with pillow case  -standing AAROM abduction slide with pillow case  -gentle cross body self stretch - seated scap squeezes        PT Short Term Goals - 05/04/21 1438       PT SHORT TERM GOAL #1   Title pt to be IND with initial HEP    Time 4    Period Weeks    Status Achieved    Target Date 05/11/21      PT SHORT TERM GOAL #2   Title increase R grip strength to >/= 105# to demo improvement in shoulder function    Baseline 110# avg    Time 4    Period Weeks    Status Achieved      PT SHORT TERM GOAL #3   Title pt to increase R shoulder PROM  flexion/ abduction to >/= 110 degrees for therapuetic progression    Time 4    Period Weeks    Status Achieved    Target Date 05/11/21               PT Long Term Goals - 04/13/21 1427       PT LONG TERM GOAL #1   Title increase R shoulder active flexion/ abduction to >/= 120 degrees and IR/ER WFL compared bil for funcitonal shoulder ROM with </= 2/10 max pain    Time 8    Period Weeks    Status New    Target Date 06/08/21      PT LONG TERM GOAL #2   Title increase R shoulder gross strength to >/= 4+/5 to assist with general lifting and carrying ADLs    Time 8    Period Weeks    Status New    Target Date 06/08/21      PT LONG TERM GOAL #3   Title pt to be able to return to his hobby of fishing with </= 2/10 max pain per personal goals    Time 8    Period Weeks    Status New    Target Date 06/08/21      PT LONG TERM GOAL #4   Title increase FOTO score to >/= 70% to demo improvement in function    Time 8    Period Weeks    Status New    Target Date 06/08/21      PT LONG TERM GOAL #5   Title pt to  be IND with all HEP to be able to maintain and progress current LOF IND    Time 8    Period Weeks    Status New    Target Date 06/08/21                   Plan - 05/11/21 1344     Clinical Impression Statement Pt reports no change after TPDN. His biggest complaints are with cross body movement and sleep positions. He has tried suggested modifications for sleep without benefit. Manual IASTM to scar and surrounding musculature/ desensitization performed. Continued with AAROM and progressed to sidelying and supine AROM of right shoulder with good tolerance.    PT Treatment/Interventions ADLs/Self Care Home Management;Cryotherapy;Electrical Stimulation;Iontophoresis 4mg /ml Dexamethasone;Moist Heat;Ultrasound;Balance training;Therapeutic exercise;Therapeutic activities;Neuromuscular re-education;Patient/family education;Manual techniques;Passive range of motion;Dry needling;Taping;Vasopneumatic Device    PT Next Visit Plan review/ update HEP PRN. . no lifting in the RUE over 2# for 6 weeks. gross shoulder ROM, Begin scapular stability, modalities for pain PRN.    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    Tendinosis of rotator cuff    Traumatic partial tear of right biceps tendon 03/04/2021   Partial tear of right subscapularis tendon 03/04/2021   Nontraumatic tear of supraspinatus  tendon, right 03/04/2021   Hyperlipidemia 02/05/2019   Hypokalemia 02/05/2019  Vitamin D deficiency 02/05/2019   Tick bite of groin 11/27/2018   Back pain 08/06/2018   Blurry vision 08/06/2018   Pain in right wrist 03/27/2017   Chronic pain of left knee 03/27/2017   HTN (hypertension) 10/23/2016   History of brain surgery 10/23/2016   Chronic headache 10/23/2016    Sherrie Mustache, PTA 05/11/2021, 1:54 PM  [facility] [address] [place], [place], [address] Phone: [phone]   Fax:  [phone]  Name: Michael Fleming MRN: [id number] Date of Birth: June 01, 1970

## 2021-05-19 ENCOUNTER — Ambulatory Visit: Payer: Self-pay | Admitting: Physical Therapy

## 2021-05-19 ENCOUNTER — Other Ambulatory Visit: Payer: Self-pay

## 2021-05-19 DIAGNOSIS — M6281 Muscle weakness (generalized): Secondary | ICD-10-CM

## 2021-05-19 DIAGNOSIS — R600 Localized edema: Secondary | ICD-10-CM

## 2021-05-19 DIAGNOSIS — G8929 Other chronic pain: Secondary | ICD-10-CM

## 2021-05-19 NOTE — Patient Instructions (Signed)
Access Code: [email or web address] URL: [email or web address] Date: 05/19/2021 Prepared by: Lulu Riding  Exercises Seated Upper Trapezius Stretch - 2 x daily - 7 x weekly - 2 sets - 2 reps - 30 seconds hold Shoulder table slides ABD - 2 x daily - 3 sets - 10 reps - 5 hold Shoulder Table Slides Flexion - 2 x daily - 3 sets - 10 reps - 5 hold Supine Shoulder Flexion with Dowel - 1 x daily - 7 x weekly - 2 sets - 10 reps Seated Scapular Retraction - 1 x daily - 7 x weekly - 2 sets - 10 reps - 5 seconds hold Supine Shoulder Protraction with Dowel - 1 x daily - 7 x weekly - 2 sets - 10 reps Shoulder Flexion Wall Slide with Towel - 1 x daily - 7 x weekly - 2 sets - 10 reps Standing Shoulder Abduction Slides at Wall - 1 x daily - 7 x weekly - 2 sets - 10 reps Seated Shoulder Flexion Full Range - 1 x daily - 7 x weekly - 2 sets - 10 reps Standing Shoulder Row with Anchored Resistance - 1 x daily - 7 x weekly - 2 sets - 10 reps Shoulder Internal Rotation - 1 x daily - 7 x weekly - 2 sets - 12 reps Shoulder External Rotation (Mirrored) - 1 x daily - 7 x weekly - 2 sets - 12 reps

## 2021-05-19 NOTE — Therapy (Signed)
[facility] Outpatient Rehabilitation [facility] [address] [place], [place], [address] Phone: [phone]   Fax:  [phone]  Physical Therapy Treatment  Patient Details  Name: Michael Fleming MRN: [id number] Date of Birth: [birth year] Referring Provider (PT): Gershon Mussel   Encounter Date: 05/19/2021   PT End of Session - 05/19/21 0933     Visit Number 8    Number of Visits 17    Date for PT Re-Evaluation 06/08/21    Authorization Type Self pay    PT Start Time 0933    PT Stop Time 1015    PT Time Calculation (min) 42 min    Activity Tolerance Patient tolerated treatment well    Behavior During Therapy [facility] for tasks assessed/performed             Past Medical History:  Diagnosis Date   Allergy    Biceps tendon tear    right   Heart palpitations 12/2018   Hyperlipidemia    Hypertension    Hypokalemia 12/2018   Seizures (HCC)    last seizure age 45 due to blood clot- 1981 none since     Past Surgical History:  Procedure Laterality Date   BRAIN SURGERY     1981 blot clot removed   FOOT SURGERY     right bunion removed   KNEE ARTHROSCOPY Left    SHOULDER ARTHROSCOPY WITH SUBACROMIAL DECOMPRESSION AND BICEP TENDON REPAIR Right 03/31/2021   Procedure: RIGHT SHOULDER ARTHROSCOPY, EXTENSIVE DEBRIDEMENT, SUBACROMIAL DECOMPRESSION, OPEN BICEPS TENODESIS;  Surgeon: Tarry Kos, MD;  Location: [facility];  Service: Orthopedics;  Laterality: Right;   WRIST SURGERY Left     There were no vitals filed for this visit.   Subjective Assessment - 05/19/21 0935     Subjective "I am still having a good amount of trouble/ pain inthe R shoulder, that keeps me up pretty much all night. Pain today is at 7/10. some days I might be ok, and others it hits me pretty good."    Patient Stated Goals to get back to fishing,    Currently in Pain? Yes    Pain Score 7     Pain Location Shoulder    Pain Orientation Right    Aggravating Factors  unsure     Pain Relieving Factors nothing                    [facility] Adult PT Treatment:                                                                          DATE: 05/19/2021 Therapeutic Exercise: UBE L1 x 6 min( 3 min fwd/bwd) Upper trap stretch R 1 x 30 sec Standing bicep stretch 3 way stretch with RUE against the wall holding 30 sec ea. Shoulder ER/IR 1 x 20 with GTB Rows bil UE with GTB  1 x 20 Bicep curl with empahsis on eccentric lowering RUE, 2 x 10 with 3# Reaching into cabinet 5 x on bottom and middle shelf x 2 sets with 3# Reviewed and updated HEP  Manual Therapy: IASTM along the R forearm, distal bicep/ triceps Trigger Point Dry-Needling  Treatment instructions: Expect mild to moderate muscle soreness.  S/S of pneumothorax if dry needled over a lung field, and to seek immediate medical attention should they occur. Patient verbalized understanding of these instructions and education.  Patient Consent Given: Yes Education handout provided: Previously provided Muscles treated: R bicep, R distal triceps and common extensor group on the R Electrical stimulation performed: No Parameters: N/A Treatment response/outcome: Twitch response elicited and Palpable decrease in muscle tension                     PT Education - 05/19/21 1013     Education Details reviewed/ updated HEP. Discussed if pain    Person(s) Educated Patient    Methods Explanation    Comprehension Verbalized understanding              PT Short Term Goals - 05/04/21 1438       PT SHORT TERM GOAL #1   Title pt to be IND with initial HEP    Time 4    Period Weeks    Status Achieved    Target Date 05/11/21      PT SHORT TERM GOAL #2   Title increase R grip strength to >/= 105# to demo improvement in shoulder function    Baseline 110# avg    Time 4    Period Weeks    Status Achieved      PT SHORT TERM GOAL #3   Title pt to increase R shoulder PROM flexion/ abduction to >/=  110 degrees for therapuetic progression    Time 4    Period Weeks    Status Achieved    Target Date 05/11/21               PT Long Term Goals - 04/13/21 1427       PT LONG TERM GOAL #1   Title increase R shoulder active flexion/ abduction to >/= 120 degrees and IR/ER WFL compared bil for funcitonal shoulder ROM with </= 2/10 max pain    Time 8    Period Weeks    Status New    Target Date 06/08/21      PT LONG TERM GOAL #2   Title increase R shoulder gross strength to >/= 4+/5 to assist with general lifting and carrying ADLs    Time 8    Period Weeks    Status New    Target Date 06/08/21      PT LONG TERM GOAL #3   Title pt to be able to return to his hobby of fishing with </= 2/10 max pain per personal goals    Time 8    Period Weeks    Status New    Target Date 06/08/21      PT LONG TERM GOAL #4   Title increase FOTO score to >/= 70% to demo improvement in function    Time 8    Period Weeks    Status New    Target Date 06/08/21      PT LONG TERM GOAL #5   Title pt to be IND with all HEP to be able to maintain and progress current LOF IND    Time 8    Period Weeks    Status New    Target Date 06/08/21                   Plan - 05/19/21 0956     Clinical Impression Statement pt arrives to session reporting continued shoulder pain that is up  today at 7/10 with no specific onset or cause.discussed with pt if it continues to call and try following up with his MD. continued TPDN today focusing on the distal bicep, triceps and common extenors followed with IASTM techniques. began shoulder strengthening due to pt being 7 weeks post op, monitored pt during exericse for pain and proper form. updated HEP today to progress shoulder strengthening gradually.    PT Treatment/Interventions ADLs/Self Care Home Management;Cryotherapy;Electrical Stimulation;Iontophoresis 4mg /ml Dexamethasone;Moist Heat;Ultrasound;Balance training;Therapeutic exercise;Therapeutic  activities;Neuromuscular re-education;Patient/family education;Manual techniques;Passive range of motion;Dry needling;Taping;Vasopneumatic Device    PT Next Visit Plan review/ update HEP PRN. pt is 7 weeks post op on 12/28, gross shoulder ROM/ strengthening. modalities for pain PRN.    PT Home Exercise Plan [id number]    Consulted and Agree with Plan of Care Patient             Patient will benefit from skilled therapeutic intervention in order to improve the following deficits and impairments:  Pain, Improper body mechanics, Increased muscle spasms, Decreased strength, Postural dysfunction, Decreased endurance, Decreased activity tolerance, Decreased range of motion, Increased edema  Visit Diagnosis: Chronic right shoulder pain  Muscle weakness (generalized)  Localized edema     Problem List Patient Active Problem List   Diagnosis Date Noted   Tendinopathy of right biceps tendon    Degenerative 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 shoulder    Tendinosis of rotator cuff    Traumatic partial tear of right biceps tendon 03/04/2021   Partial tear of right subscapularis tendon 03/04/2021   Nontraumatic tear of supraspinatus tendon, right 03/04/2021   Hyperlipidemia 02/05/2019   Hypokalemia 02/05/2019   Vitamin D deficiency 02/05/2019   Tick bite of groin 11/27/2018   Back pain 08/06/2018   Blurry vision 08/06/2018   Pain in right wrist 03/27/2017   Chronic pain of left knee 03/27/2017   HTN (hypertension) 10/23/2016   History of brain surgery 10/23/2016   Chronic headache 10/23/2016    12/23/2016 PT, DPT, LAT, ATC  05/19/21  10:18 AM      [facility] Outpatient Rehabilitation [facility] [address] [place], [place], [place] Phone: [phone]   Fax:  [phone]  Name: Michael Fleming MRN: Desmond Lope Date of Birth: 09-26-1970

## 2021-05-25 ENCOUNTER — Other Ambulatory Visit: Payer: Self-pay

## 2021-05-25 ENCOUNTER — Encounter: Payer: Self-pay | Admitting: Physical Therapy

## 2021-05-25 ENCOUNTER — Ambulatory Visit: Payer: Self-pay | Attending: Orthopaedic Surgery | Admitting: Physical Therapy

## 2021-05-25 DIAGNOSIS — R600 Localized edema: Secondary | ICD-10-CM

## 2021-05-25 DIAGNOSIS — M25511 Pain in right shoulder: Secondary | ICD-10-CM | POA: Insufficient documentation

## 2021-05-25 DIAGNOSIS — M6281 Muscle weakness (generalized): Secondary | ICD-10-CM

## 2021-05-25 DIAGNOSIS — G8929 Other chronic pain: Secondary | ICD-10-CM

## 2021-05-25 NOTE — Therapy (Signed)
[facility] Outpatient Rehabilitation [facility] [address] [place], [place], [address] Phone: [phone]   Fax:  [phone]  Physical Therapy Treatment  Patient Details  Name: Michael Fleming MRN: [id number] Date of Birth: [birth year] Referring Provider (PT): Gershon Mussel   Encounter Date: 05/25/2021   PT End of Session - 05/25/21 0805     Visit Number 9    Number of Visits 17    Date for PT Re-Evaluation 06/08/21    Authorization Type Self pay    PT Start Time 0800    PT Stop Time 0838    PT Time Calculation (min) 38 min             Past Medical History:  Diagnosis Date   Allergy    Biceps tendon tear    right   Heart palpitations 12/2018   Hyperlipidemia    Hypertension    Hypokalemia 12/2018   Seizures (HCC)    last seizure age 49 due to blood clot- 1981 none since     Past Surgical History:  Procedure Laterality Date   BRAIN SURGERY     1981 blot clot removed   FOOT SURGERY     right bunion removed   KNEE ARTHROSCOPY Left    SHOULDER ARTHROSCOPY WITH SUBACROMIAL DECOMPRESSION AND BICEP TENDON REPAIR Right 03/31/2021   Procedure: RIGHT SHOULDER ARTHROSCOPY, EXTENSIVE DEBRIDEMENT, SUBACROMIAL DECOMPRESSION, OPEN BICEPS TENODESIS;  Surgeon: Tarry Kos, MD;  Location: [facility];  Service: Orthopedics;  Laterality: Right;   WRIST SURGERY Left     There were no vitals filed for this visit.   Subjective Assessment - 05/25/21 0804     Subjective The shoulder has been okay. It was 2/10 this morning. I have the most difficulty reach back to pull covers forward. The pain at night is improving.    Currently in Pain? No/denies            [facility] Adult PT Treatment:                                                                          DATE: 05/25/2021 Therapeutic Exercise: UBE L2 x 6 min( 3 min fwd/bwd) Upper trap stretch R 21 x 30 sec Shoulder ER/IR 1 x 20 with GTB Right  Rows bil UE with GTB  1 x 20 Bicep curl with  empahsis on eccentric lowering RUE, 2 x 10 with 4# Reaching into cabinet 10 x on middle shelf x 2 sets with 3# -standing 2# forward raise  -standing 2# lateral raise x 10 Standing bicep stretch 3 way stretch with RUE against the wall holding 30 sec ea. -gentle cross body self stretch Supine GTB horizontal abduction x 20 Side lying abduction 2# x 15 , ER 2# x15  Manual Therapy: passive ROM right shoulder flexion, abduction, IR, ER        PT Short Term Goals - 05/04/21 1438       PT SHORT TERM GOAL #1   Title pt to be IND with initial HEP    Time 4    Period Weeks    Status Achieved    Target Date 05/11/21      PT SHORT TERM GOAL #2  Title increase R grip strength to >/= 105# to demo improvement in shoulder function    Baseline 110# avg    Time 4    Period Weeks    Status Achieved      PT SHORT TERM GOAL #3   Title pt to increase R shoulder PROM flexion/ abduction to >/= 110 degrees for therapuetic progression    Time 4    Period Weeks    Status Achieved    Target Date 05/11/21               PT Long Term Goals - 04/13/21 1427       PT LONG TERM GOAL #1   Title increase R shoulder active flexion/ abduction to >/= 120 degrees and IR/ER WFL compared bil for funcitonal shoulder ROM with </= 2/10 max pain    Time 8    Period Weeks    Status New    Target Date 06/08/21      PT LONG TERM GOAL #2   Title increase R shoulder gross strength to >/= 4+/5 to assist with general lifting and carrying ADLs    Time 8    Period Weeks    Status New    Target Date 06/08/21      PT LONG TERM GOAL #3   Title pt to be able to return to his hobby of fishing with </= 2/10 max pain per personal goals    Time 8    Period Weeks    Status New    Target Date 06/08/21      PT LONG TERM GOAL #4   Title increase FOTO score to >/= 70% to demo improvement in function    Time 8    Period Weeks    Status New    Target Date 06/08/21      PT LONG TERM GOAL #5   Title pt to be  IND with all HEP to be able to maintain and progress current LOF IND    Time 8    Period Weeks    Status New    Target Date 06/08/21                   Plan - 05/25/21 0839     Clinical Impression Statement Pt reports improved shoulder pain and night time pain. He believes TPDN was helpful.  He notes continued difficulty moving from extension into flexion while pulling his bedcovers at night. Able to progress with additional standing therex with resistance and he tolerated this well with fatigue. His abduction and IR AROM has improved.    PT Treatment/Interventions ADLs/Self Care Home Management;Cryotherapy;Electrical Stimulation;Iontophoresis 4mg /ml Dexamethasone;Moist Heat;Ultrasound;Balance training;Therapeutic exercise;Therapeutic activities;Neuromuscular re-education;Patient/family education;Manual techniques;Passive range of motion;Dry needling;Taping;Vasopneumatic Device    PT Next Visit Plan review/ update HEP PRN. Marland Kitchen. pt is 7 weeks post op on 12/28, gross shoulder ROM/ strengthening. modalities for pain PRN.    PT Home Exercise Plan [id number]             Patient will benefit from skilled therapeutic intervention in order to improve the following deficits and impairments:  Pain, Improper body mechanics, Increased muscle spasms, Decreased strength, Postural dysfunction, Decreased endurance, Decreased activity tolerance, Decreased range of motion, Increased edema  Visit Diagnosis: Chronic right shoulder pain  Muscle weakness (generalized)  Localized edema     Problem List Patient Active Problem List   Diagnosis Date Noted   Tendinopathy of right biceps tendon    Degenerative superior labral anterior-to-posterior (  SLAP) tear of right shoulder    Tendinosis of rotator cuff    Traumatic partial tear of right biceps tendon 03/04/2021   Partial tear of right subscapularis tendon 03/04/2021   Nontraumatic tear of supraspinatus tendon, right 03/04/2021   Hyperlipidemia  02/05/2019   Hypokalemia 02/05/2019   Vitamin D deficiency 02/05/2019   Tick bite of groin 11/27/2018   Back pain 08/06/2018   Blurry vision 08/06/2018   Pain in right wrist 03/27/2017   Chronic pain of left knee 03/27/2017   HTN (hypertension) 10/23/2016   History of brain surgery 10/23/2016   Chronic headache 10/23/2016    Sherrie Mustache, PTA 05/25/2021, 8:41 AM  [facility] [address] [place], [place], [address] Phone: [phone]   Fax:  [phone]  Name: Michael Fleming MRN: [id number] Date of Birth: 1970/10/17

## 2021-05-27 ENCOUNTER — Encounter: Payer: Self-pay | Admitting: Physical Therapy

## 2021-05-27 ENCOUNTER — Other Ambulatory Visit: Payer: Self-pay

## 2021-05-27 ENCOUNTER — Ambulatory Visit: Payer: Self-pay | Admitting: Physical Therapy

## 2021-05-27 DIAGNOSIS — G8929 Other chronic pain: Secondary | ICD-10-CM

## 2021-05-27 DIAGNOSIS — R600 Localized edema: Secondary | ICD-10-CM

## 2021-05-27 DIAGNOSIS — M6281 Muscle weakness (generalized): Secondary | ICD-10-CM

## 2021-05-27 NOTE — Therapy (Signed)
[place] °[facility] °[address] °[place], [place], [address] °Phone: [phone]   Fax:  [phone] ° °Physical Therapy Treatment ° °Patient Details  °Name: Michael Fleming °MRN: [id number] °Date of Birth: 02/13/1971 °Referring Provider (PT): Xu Naiping ° ° °Encounter Date: 05/27/2021 ° ° PT End of Session - 05/27/21 0847   ° ° Visit Number 10   ° Number of Visits 17   ° Date for PT Re-Evaluation 06/08/21   ° Authorization Type Self pay   ° PT Start Time 0845   ° PT Stop Time 0925   ° PT Time Calculation (min) 40 min   ° °  °  ° °  ° ° °Past Medical History:  °Diagnosis Date  ° Allergy   ° Biceps tendon tear   ° right  ° Heart palpitations 12/2018  ° Hyperlipidemia   ° Hypertension   ° Hypokalemia 12/2018  ° Seizures (HCC)   ° last seizure age 9 due to blood clot- 1981 none since   ° ° °Past Surgical History:  °Procedure Laterality Date  ° BRAIN SURGERY    ° 1981 blot clot removed  ° FOOT SURGERY    ° right bunion removed  ° KNEE ARTHROSCOPY Left   ° SHOULDER ARTHROSCOPY WITH SUBACROMIAL DECOMPRESSION AND BICEP TENDON REPAIR Right 03/31/2021  ° Procedure: RIGHT SHOULDER ARTHROSCOPY, EXTENSIVE DEBRIDEMENT, SUBACROMIAL DECOMPRESSION, OPEN BICEPS TENODESIS;  Surgeon: Xu, Naiping M, MD;  Location: [facility];  Service: Orthopedics;  Laterality: Right;  ° WRIST SURGERY Left   ° ° °There were no vitals filed for this visit. ° ° Subjective Assessment - 05/27/21 0846   ° ° Subjective Pain is a 2/10, but is still up to a 6/10 at night with trying to sleep.   ° Currently in Pain? Yes   ° Pain Score 2    ° Pain Location Shoulder   ° Pain Orientation Right   ° Pain Descriptors / Indicators Tightness   ° Pain Type Chronic pain   ° Aggravating Factors  trying to sleep   ° Pain Relieving Factors nothing helps at night   ° °  °  ° °  ° ° °[facility] Adult PT Treatment:                                                                          DATE: 05/27/2021 °Therapeutic  Exercise: °UBE L2.5 x 6 min( 3 min fwd/bwd) °Shoulder ER bilat x 20 with GTB Right  °Rows seated 35# x 25 °Seated chest press 25# x 20 °Bicep curl with empahsis on eccentric lowering RUE, 2 x 10 with 4#, 5# second set °Reaching into cabinet 10 x on middle shelf x 2 sets with 3#, 4# second set °-standing 2# forward raise x10 , second set with 3#  °-standing 2# lateral raise x 10, 2 sets °Standing bicep stretch 3 way stretch with RUE against the wall holding 30 sec ea. °Supine GTB horizontal abduction x 20 °Side lying abduction 2# x 15 x2 , ER 2# x15 x2 °Rebounder right throw and catch for simulation of casting a fishing rod. Red x15 , yellow x 15 °Wall push up x 15  °Doorway pec stretch 30 sec   x 2  Manual Therapy: passive ROM right shoulder flexion, abduction, IR, ER          PT Short Term Goals - 05/04/21 1438       PT SHORT TERM GOAL #1   Title pt to be IND with initial HEP    Time 4    Period Weeks    Status Achieved    Target Date 05/11/21      PT SHORT TERM GOAL #2   Title increase R grip strength to >/= 105# to demo improvement in shoulder function    Baseline 110# avg    Time 4    Period Weeks    Status Achieved      PT SHORT TERM GOAL #3   Title pt to increase R shoulder PROM flexion/ abduction to >/= 110 degrees for therapuetic progression    Time 4    Period Weeks    Status Achieved    Target Date 05/11/21               PT Long Term Goals - 05/27/21 0904       PT LONG TERM GOAL #1   Title increase R shoulder active flexion/ abduction to >/= 120 degrees and IR/ER WFL compared bil for funcitonal shoulder ROM with </= 2/10 max pain    Time 8    Period Weeks    Status Achieved    Target Date 06/08/21      PT LONG TERM GOAL #2   Title increase R shoulder gross strength to >/= 4+/5 to assist with general lifting and carrying ADLs    Time 8    Period Weeks    Status On-going      PT LONG TERM GOAL #3   Title pt to be able to return to his hobby of fishing  with </= 2/10 max pain per personal goals    Time 8    Period Weeks    Status Unable to assess      PT LONG TERM GOAL #4   Title increase FOTO score to >/= 70% to demo improvement in function    Baseline 75 status    Time 8    Period Weeks    Status Achieved      PT LONG TERM GOAL #5   Title pt to be IND with all HEP to be able to maintain and progress current LOF IND    Time 8    Period Weeks    Status On-going    Target Date 06/08/21                   Plan - 05/27/21 0951     Clinical Impression Statement Pt arrives reporting 6/10 pain at night otherwise he does not have much pain. He does having some popping at proximal bicep intermittently with therex. Continued with progression with shoulder, bicep and RTC strength which he tolerates well. His FOTO score has improved to beyond prediction. Began simulation of casting a fishing rod using the rebounder which he did well and without increased pain. He has met most of his LTGS. Will continue to strengthen. 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 He was encourgaed to begin using his RUE for functional tasks, avoiding pain increase.    PT Treatment/Interventions ADLs/Self Care Home Management;Cryotherapy;Electrical Stimulation;Iontophoresis 39m/ml Dexamethasone;Moist Heat;Ultrasound;Balance training;Therapeutic exercise;Therapeutic activities;Neuromuscular re-education;Patient/family education;Manual techniques;Passive range of motion;Dry needling;Taping;Vasopneumatic Device    PT Next Visit Plan review/ update  HEP PRN. Marland Kitchen pt is 8 weeks post op on 05/26/21, gross shoulder ROM/ strengthening. modalities for pain PRN.    PT Home Exercise Plan [id number]             Patient will benefit from skilled therapeutic intervention in order to improve the following deficits and impairments:  Pain, Improper body mechanics, Increased muscle spasms, Decreased strength, Postural dysfunction, Decreased endurance,  Decreased activity tolerance, Decreased range of motion, Increased edema  Visit Diagnosis: Chronic right shoulder pain  Muscle weakness (generalized)  Localized edema     Problem List Patient Active Problem List   Diagnosis Date Noted   Tendinopathy of right biceps tendon    Degenerative 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 shoulder    Tendinosis of rotator cuff    Traumatic partial tear of right biceps tendon 03/04/2021   Partial tear of right subscapularis tendon 03/04/2021   Nontraumatic tear of supraspinatus tendon, right 03/04/2021   Hyperlipidemia 02/05/2019   Hypokalemia 02/05/2019   Vitamin D deficiency 02/05/2019   Tick bite of groin 11/27/2018   Back pain 08/06/2018   Blurry vision 08/06/2018   Pain in right wrist 03/27/2017   Chronic pain of left knee 03/27/2017   HTN (hypertension) 10/23/2016   History of brain surgery 10/23/2016   Chronic headache 10/23/2016    Dorene Ar, PTA 05/27/2021, 9:57 AM  [facility] [address] [place], [place], [address] Phone: [phone]   Fax:  [phone]  Name: Michael Fleming MRN: [id number] Date of Birth: 10-28-1970

## 2021-05-31 ENCOUNTER — Ambulatory Visit: Payer: Self-pay | Admitting: Physical Therapy

## 2021-06-01 ENCOUNTER — Telehealth: Payer: Self-pay | Admitting: Physical Therapy

## 2021-06-01 NOTE — Telephone Encounter (Signed)
Spoke with pt regarding his missed appointment at 9:30 on Monday 05/31/21. He stated he didn't get an appointment reminder call. I discussed that we print out the scheduled to help them keep track of their appointments and that the appt reminder call is a curtesy. And the call likely came on Friday since his appt was on Monday.   He confirmed he will be at his next appoint on 1/12.    Zania Kalisz PT, DPT, LAT, ATC  06/01/21  4:46 PM

## 2021-06-03 ENCOUNTER — Encounter: Payer: Self-pay | Admitting: Physical Therapy

## 2021-06-03 ENCOUNTER — Ambulatory Visit (INDEPENDENT_AMBULATORY_CARE_PROVIDER_SITE_OTHER): Payer: Self-pay | Admitting: Orthopaedic Surgery

## 2021-06-03 ENCOUNTER — Ambulatory Visit: Payer: Self-pay | Admitting: Physical Therapy

## 2021-06-03 ENCOUNTER — Other Ambulatory Visit: Payer: Self-pay

## 2021-06-03 DIAGNOSIS — M75101 Unspecified rotator cuff tear or rupture of right shoulder, not specified as traumatic: Secondary | ICD-10-CM

## 2021-06-03 DIAGNOSIS — S46811A Strain of other muscles, fascia and tendons at shoulder and upper arm level, right arm, initial encounter: Secondary | ICD-10-CM

## 2021-06-03 DIAGNOSIS — M6281 Muscle weakness (generalized): Secondary | ICD-10-CM

## 2021-06-03 DIAGNOSIS — G8929 Other chronic pain: Secondary | ICD-10-CM

## 2021-06-03 DIAGNOSIS — S46211A Strain of muscle, fascia and tendon of other parts of biceps, right arm, initial encounter: Secondary | ICD-10-CM

## 2021-06-03 DIAGNOSIS — R600 Localized edema: Secondary | ICD-10-CM

## 2021-06-03 NOTE — Progress Notes (Signed)
Post-Op Visit Note   Patient: Michael Fleming           Date of Birth: August 24, 1970           MRN: [id number] Visit Date: 06/03/2021 PCP: Kathrynn Speed, NP   Assessment & Plan:  Chief Complaint:  Chief Complaint  Patient presents with   Right Shoulder - Pain   Right Elbow - Pain   Visit Diagnoses:  1. Nontraumatic tear of supraspinatus tendon, right   2. Partial tear of right subscapularis tendon, initial encounter   3. Traumatic partial tear of right biceps tendon, initial encounter     Plan: Michael Fleming is 9 weeks status post shoulder arthroscopy biceps tenodesis extensive debridement subacromial decompression.  He has been going to physical therapy twice a week.  He had some numbness and tingling in the fingers but this is improving.  Overall he is making excellent progress at physical therapy.  He has some difficulty and mild discomfort with shoulder abduction.  Right shoulder shows full healed surgical scars.  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  He has no symptoms related to this.  Forward flexion to 145 degrees.  External rotation 25.  Abduction 85.  Internal rotation L5.  I am pleased with his progress.  He will continue outpatient PT until he is discharged to home exercises.  We will see him back in 6 weeks for recheck.  Follow-Up Instructions: Return in about 6 weeks (around 07/15/2021).   Orders:  No orders of the defined types were placed in this encounter.  No orders of the defined types were placed in this encounter.   Imaging: No results found.  PMFS History: Patient Active Problem List   Diagnosis Date Noted   Tendinopathy of right biceps tendon    Degenerative 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 shoulder    Tendinosis of rotator cuff    Traumatic partial tear of right biceps tendon 03/04/2021   Partial tear of right subscapularis tendon 03/04/2021   Nontraumatic tear of supraspinatus tendon, right 03/04/2021   Hyperlipidemia  02/05/2019   Hypokalemia 02/05/2019   Vitamin D deficiency 02/05/2019   Tick bite of groin 11/27/2018   Back pain 08/06/2018   Blurry vision 08/06/2018   Pain in right wrist 03/27/2017   Chronic pain of left knee 03/27/2017   HTN (hypertension) 10/23/2016   History of brain surgery 10/23/2016   Chronic headache 10/23/2016   Past Medical History:  Diagnosis Date   Allergy    Biceps tendon tear    right   Heart palpitations 12/2018   Hyperlipidemia    Hypertension    Hypokalemia 12/2018   Seizures (HCC)    last seizure age 54 due to blood clot- 1981 none since     Family History  Problem Relation Age of Onset   Healthy Mother    Hypertension Father    Colon cancer Neg Hx    Colon polyps Neg Hx    Esophageal cancer Neg Hx    Rectal cancer Neg Hx    Stomach cancer Neg Hx     Past Surgical History:  Procedure Laterality Date   BRAIN SURGERY     1981 blot clot removed   FOOT SURGERY     right bunion removed   KNEE ARTHROSCOPY Left    SHOULDER ARTHROSCOPY WITH SUBACROMIAL DECOMPRESSION AND BICEP TENDON REPAIR Right 03/31/2021   Procedure: RIGHT SHOULDER ARTHROSCOPY, EXTENSIVE DEBRIDEMENT, SUBACROMIAL DECOMPRESSION, OPEN BICEPS TENODESIS;  Surgeon: Tarry Kos,  MD;  Location: [facility];  Service: Orthopedics;  Laterality: Right;   WRIST SURGERY Left    Social History   Occupational History   Not on file  Tobacco Use   Smoking status: Never   Smokeless tobacco: Never  Vaping Use   Vaping Use: Never used  Substance and Sexual Activity   Alcohol use: Yes    Comment: occasional   Drug use: No   Sexual activity: Not Currently

## 2021-06-03 NOTE — Therapy (Signed)
[facility] [address] [place], [place], [address] Phone: [phone]   Fax:  [phone]  Physical Therapy Treatment  Patient Details  Name: Michael Fleming MRN: [id number] Date of Birth: 1971-05-12 Referring Provider (PT): Gershon Mussel   Encounter Date: 06/03/2021   PT End of Session - 06/03/21 0933     Visit Number 11    Number of Visits 17    Date for PT Re-Evaluation 06/08/21    Authorization Type Self pay    PT Start Time 0932    PT Stop Time 1015    PT Time Calculation (min) 43 min    Activity Tolerance Patient tolerated treatment well    Behavior During Therapy [facility] for tasks assessed/performed             Past Medical History:  Diagnosis Date   Allergy    Biceps tendon tear    right   Heart palpitations 12/2018   Hyperlipidemia    Hypertension    Hypokalemia 12/2018   Seizures (HCC)    last seizure age 2 due to blood clot- 1981 none since     Past Surgical History:  Procedure Laterality Date   BRAIN SURGERY     1981 blot clot removed   FOOT SURGERY     right bunion removed   KNEE ARTHROSCOPY Left    SHOULDER ARTHROSCOPY WITH SUBACROMIAL DECOMPRESSION AND BICEP TENDON REPAIR Right 03/31/2021   Procedure: RIGHT SHOULDER ARTHROSCOPY, EXTENSIVE DEBRIDEMENT, SUBACROMIAL DECOMPRESSION, OPEN BICEPS TENODESIS;  Surgeon: Tarry Kos, MD;  Location: [facility];  Service: Orthopedics;  Laterality: Right;   WRIST SURGERY Left     There were no vitals filed for this visit.   Subjective Assessment - 06/03/21 0934     Subjective "I am still, feeling alittle sore. Overall I am doing pretty good. I do get some sorness with reaching back pulling. Still get some popping which triggers the pain in the elbow."    Patient Stated Goals to get back to fishing,    Currently in Pain? Yes    Pain Score 1    last took medicine for pain at 5am   Pain Location Shoulder    Pain Orientation Right    Pain  Type Chronic pain    Pain Onset More than a month ago    Pain Frequency Intermittent    Aggravating Factors  reaching back and pulling weight forward                       [facility] Adult PT Treatment:                                                DATE: 06/03/2021  Therapeutic Exercise: Bicep stretch in supine 2 x 30 second RUE only UBE L4 x 6 min (FWD/BWD x 3 min) Body blade IR/ER 3 x 30 sec Concentration Bicep curl with focus on 5 sec eccentric lowering 2 x 12 5# Wall circles CW/CCW forward at shoulder height  10 ea, x 2 sets each Repeated in abducted position  Manual Therapy: MTPR along the biceps brachii Distal Clavicle mobs greade III inferior/ posterior Therapeutic Activity: Mimicking using fishing rod (with body blade) and casting at 50% max effort to focus on proper form  PT Short Term Goals - 05/04/21 1438       PT SHORT TERM GOAL #1   Title pt to be IND with initial HEP    Time 4    Period Weeks    Status Achieved    Target Date 05/11/21      PT SHORT TERM GOAL #2   Title increase R grip strength to >/= 105# to demo improvement in shoulder function    Baseline 110# avg    Time 4    Period Weeks    Status Achieved      PT SHORT TERM GOAL #3   Title pt to increase R shoulder PROM flexion/ abduction to >/= 110 degrees for therapuetic progression    Time 4    Period Weeks    Status Achieved    Target Date 05/11/21               PT Long Term Goals - 05/27/21 0904       PT LONG TERM GOAL #1   Title increase R shoulder active flexion/ abduction to >/= 120 degrees and IR/ER WFL compared bil for funcitonal shoulder ROM with </= 2/10 max pain    Time 8    Period Weeks    Status Achieved    Target Date 06/08/21      PT LONG TERM GOAL #2   Title increase R shoulder gross strength to >/= 4+/5 to assist with general lifting and carrying ADLs    Time 8    Period Weeks    Status On-going      PT LONG TERM  GOAL #3   Title pt to be able to return to his hobby of fishing with </= 2/10 max pain per personal goals    Time 8    Period Weeks    Status Unable to assess      PT LONG TERM GOAL #4   Title increase FOTO score to >/= 70% to demo improvement in function    Baseline 75 status    Time 8    Period Weeks    Status Achieved      PT LONG TERM GOAL #5   Title pt to be IND with all HEP to be able to maintain and progress current LOF IND    Time 8    Period Weeks    Status On-going    Target Date 06/08/21                   Plan - 06/03/21 0946     Clinical Impression Statement Michael Fleming reports he is doing pretty good overall but does have some soreness in the bicep most notably with reaching back and pulling forward with some resistance. Continued MTPR techniques along the bicep brachii followed with stretching. continued working on gross shoulder strengthening with focus on bicep utilizing eccentrics to promote remodeling and general strenth.    PT Treatment/Interventions ADLs/Self Care Home Management;Cryotherapy;Electrical Stimulation;Iontophoresis 4mg /ml Dexamethasone;Moist Heat;Ultrasound;Balance training;Therapeutic exercise;Therapeutic activities;Neuromuscular re-education;Patient/family education;Manual techniques;Passive range of motion;Dry needling;Taping;Vasopneumatic Device    PT Next Visit Plan review/ update HEP PRN. Marland Kitchen. pt is 8 weeks post op on 05/26/21, gross shoulder ROM/ strengthening. modalities for pain PRN.    PT Home Exercise Plan [id number]             Patient will benefit from skilled therapeutic intervention in order to improve the following deficits and impairments:  Pain, Improper body mechanics, Increased muscle spasms, Decreased strength, Postural dysfunction, Decreased  endurance, Decreased activity tolerance, Decreased range of motion, Increased edema  Visit Diagnosis: Chronic right shoulder pain  Muscle weakness (generalized)  Localized  edema     Problem List Patient Active Problem List   Diagnosis Date Noted   Tendinopathy of right biceps tendon    Degenerative 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 shoulder    Tendinosis of rotator cuff    Traumatic partial tear of right biceps tendon 03/04/2021   Partial tear of right subscapularis tendon 03/04/2021   Nontraumatic tear of supraspinatus tendon, right 03/04/2021   Hyperlipidemia 02/05/2019   Hypokalemia 02/05/2019   Vitamin D deficiency 02/05/2019   Tick bite of groin 11/27/2018   Back pain 08/06/2018   Blurry vision 08/06/2018   Pain in right wrist 03/27/2017   Chronic pain of left knee 03/27/2017   HTN (hypertension) 10/23/2016   History of brain surgery 10/23/2016   Chronic headache 10/23/2016    Lulu Riding PT, DPT, LAT, ATC  06/03/21  10:18 AM      [facility] [address] [place], [place], [address] Phone: [phone]   Fax:  [phone]  Name: Michael Fleming MRN: [id number] Date of Birth: 08/28/1970

## 2021-06-07 ENCOUNTER — Ambulatory Visit: Payer: Self-pay | Admitting: Physical Therapy

## 2021-06-07 ENCOUNTER — Other Ambulatory Visit: Payer: Self-pay

## 2021-06-07 ENCOUNTER — Encounter: Payer: Self-pay | Admitting: Physical Therapy

## 2021-06-07 DIAGNOSIS — R600 Localized edema: Secondary | ICD-10-CM

## 2021-06-07 DIAGNOSIS — M25511 Pain in right shoulder: Secondary | ICD-10-CM

## 2021-06-07 DIAGNOSIS — G8929 Other chronic pain: Secondary | ICD-10-CM

## 2021-06-07 DIAGNOSIS — M6281 Muscle weakness (generalized): Secondary | ICD-10-CM

## 2021-06-07 NOTE — Therapy (Addendum)
[place], [place], [address] Phone: [phone]   Fax:  [phone]  Physical Therapy Treatment / Discharge  Patient Details  Name: Michael Fleming MRN: [id number] Date of Birth: July 29, 1970 Referring Provider (PT): Frankey Shown   Encounter Date: 06/07/2021   PT End of Session - 06/07/21 0940     Visit Number 12    Number of Visits 17    Date for PT Re-Evaluation 06/08/21    Authorization Type Self pay    PT Start Time 0932    PT Stop Time 1012    PT Time Calculation (min) 40 min             Past Medical History:  Diagnosis Date   Allergy    Biceps tendon tear    right   Heart palpitations 12/2018   Hyperlipidemia    Hypertension    Hypokalemia 12/2018   Seizures ([place])    last seizure age 68 due to blood clot- 1981 none since     Past Surgical History:  Procedure Laterality Date   BRAIN SURGERY     1981 blot clot removed   FOOT SURGERY     right bunion removed   KNEE ARTHROSCOPY Left    SHOULDER ARTHROSCOPY WITH SUBACROMIAL DECOMPRESSION AND BICEP TENDON REPAIR Right 03/31/2021   Procedure: RIGHT SHOULDER ARTHROSCOPY, EXTENSIVE DEBRIDEMENT, SUBACROMIAL DECOMPRESSION, OPEN BICEPS TENODESIS;  Surgeon: Leandrew Koyanagi, MD;  Location: [facility];  Service: Orthopedics;  Laterality: Right;   WRIST SURGERY Left     There were no vitals filed for this visit.   Subjective Assessment - 06/07/21 0939     Subjective The shoulder is doing okay. I saw the MD before my last visit and I will go back to him in 6 weeks.    Currently in Pain? No/denies               [facility] Adult PT Treatment:                                                                          DATE: 06/07/2021 Therapeutic Exercise: UBE L2.5 x 6 min( 3 min fwd/bwd) Shoulder ER bilat x 20 with BTB bilat Rows seated 45# x 25 Seated chest press 30# x 20 Lat pull down 45# x 15  Bicep curl with empahsis on  eccentric lowering RUE, 2 x 10 with 6# Reaching into cabinet 10 x on middle shelf x 2 sets with 5# Wall circles CW/CCW forward at shoulder height  10 ea, x 2 sets each Repeated in abducted position   Standing bicep stretch 3 way stretch with RUE against the wall holding 30 sec ea.  Doorway pec stretch 30 sec x 2  Manual Therapy: passive ROM right shoulder flexion, abduction, IR, ER   [facility] PT Assessment - 06/07/21 0001       Observation/Other Assessments   Focus on Therapeutic Outcomes (FOTO)  75%      AROM   Right Shoulder Flexion 150 Degrees    Right Shoulder ABduction 150 Degrees    Right Shoulder Internal Rotation 70 Degrees   L1   Right Shoulder External Rotation 90 Degrees  Strength   Right Shoulder Flexion 5/5    Right Shoulder ABduction 4+/5    Right Shoulder Internal Rotation 5/5    Right Shoulder External Rotation 5/5                       PT Short Term Goals - 05/04/21 1438       PT SHORT TERM GOAL #1   Title pt to be IND with initial HEP    Time 4    Period Weeks    Status Achieved    Target Date 05/11/21      PT SHORT TERM GOAL #2   Title increase R grip strength to >/= 105# to demo improvement in shoulder function    Baseline 110# avg    Time 4    Period Weeks    Status Achieved      PT SHORT TERM GOAL #3   Title pt to increase R shoulder PROM flexion/ abduction to >/= 110 degrees for therapuetic progression    Time 4    Period Weeks    Status Achieved    Target Date 05/11/21               PT Long Term Goals - 06/07/21 1006       PT LONG TERM GOAL #1   Title increase R shoulder active flexion/ abduction to >/= 120 degrees and IR/ER WFL compared bil for funcitonal shoulder ROM with </= 2/10 max pain    Time 8    Period Weeks    Status Achieved    Target Date 06/08/21      PT LONG TERM GOAL #2   Title increase R shoulder gross strength to >/= 4+/5 to assist with general lifting and carrying ADLs    Time 8    Period  Weeks    Status Achieved      PT LONG TERM GOAL #3   Title pt to be able to return to his hobby of fishing with </= 2/10 max pain per personal goals    Baseline has not attempted fishing    Time 8    Period Weeks    Status Unable to assess    Target Date 06/08/21      PT LONG TERM GOAL #4   Title increase FOTO score to >/= 70% to demo improvement in function    Baseline 75 status    Time 8    Period Weeks    Status Achieved    Target Date 06/08/21      PT LONG TERM GOAL #5   Title pt to be IND with all HEP to be able to maintain and progress current LOF IND    Time 8    Period Weeks    Status Achieved    Target Date 06/08/21                   Plan - 06/07/21 1007     Clinical Impression Statement Pt reports min pain in shoulder and elbow with rolling onto right shoulder at night. Otherwise he does not have any shoulder pain. His AROM and MMT are [facility]. He has resumed all normal activity with RUE except fishing. He has met all other LTGS and is agreeable to discharge to HEP at this time.    PT Treatment/Interventions ADLs/Self Care Home Management;Cryotherapy;Electrical Stimulation;Iontophoresis 47m/ml Dexamethasone;Moist Heat;Ultrasound;Balance training;Therapeutic exercise;Therapeutic activities;Neuromuscular re-education;Patient/family education;Manual techniques;Passive range of motion;Dry needling;Taping;Vasopneumatic Device    PT Next  Visit Plan discharge to [facility] and Agree with Plan of Care Patient             Patient will benefit from skilled therapeutic intervention in order to improve the following deficits and impairments:  Pain, Improper body mechanics, Increased muscle spasms, Decreased strength, Postural dysfunction, Decreased endurance, Decreased activity tolerance, Decreased range of motion, Increased edema  Visit Diagnosis: Chronic right shoulder pain  Muscle weakness (generalized)  Localized  edema     Problem List Patient Active Problem List   Diagnosis Date Noted   Tendinopathy of right biceps tendon    Degenerative 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 shoulder    Tendinosis of rotator cuff    Traumatic partial tear of right biceps tendon 03/04/2021   Partial tear of right subscapularis tendon 03/04/2021   Nontraumatic tear of supraspinatus tendon, right 03/04/2021   Hyperlipidemia 02/05/2019   Hypokalemia 02/05/2019   Vitamin D deficiency 02/05/2019   Tick bite of groin 11/27/2018   Back pain 08/06/2018   Blurry vision 08/06/2018   Pain in right wrist 03/27/2017   Chronic pain of left knee 03/27/2017   HTN (hypertension) 10/23/2016   History of brain surgery 10/23/2016   Chronic headache 10/23/2016    Dorene Ar, PTA 06/07/2021, 10:15 AM  [facility] [address] [place], [place], [address] Phone: [phone]   Fax:  [phone]  Name: Michael Fleming MRN: [id number] Date of Birth: [birth year]       PHYSICAL THERAPY DISCHARGE SUMMARY  Visits from Start of Care: 12  Current functional level related to goals / functional outcomes: See goals   Remaining deficits: See assessment   Education / Equipment: HEP, theraband, posture, lifting mechancis   Patient agrees to discharge. Patient goals were met. Patient is being discharged due to being pleased with the current functional level.   Kristoffer Leamon PT, DPT, LAT, ATC  06/07/21  12:19 PM

## 2021-06-14 ENCOUNTER — Telehealth: Payer: Self-pay | Admitting: Orthopaedic Surgery

## 2021-06-14 NOTE — Telephone Encounter (Signed)
Pt called requesting a call back from Dr. Erlinda Hong. Pt did not want to say what the reasoning for call back is for. Please call pt at [phone].

## 2021-06-16 NOTE — Telephone Encounter (Signed)
Called patient to see what he needs. States he is Still having pain more like a toothache pain. Has tried everything OTC, nothing helps. Would like pain meds.

## 2021-06-17 ENCOUNTER — Other Ambulatory Visit: Payer: Self-pay | Admitting: Physician Assistant

## 2021-06-17 ENCOUNTER — Other Ambulatory Visit: Payer: Self-pay

## 2021-06-17 MED ORDER — TRAMADOL HCL 50 MG PO TABS
50.0000 mg | ORAL_TABLET | Freq: Three times a day (TID) | ORAL | 2 refills | Status: DC | PRN
  Filled 2021-06-17: qty 30, 10d supply, fill #0

## 2021-06-17 NOTE — Telephone Encounter (Signed)
Ok, I sent in tramadol

## 2021-06-17 NOTE — Telephone Encounter (Signed)
Called patient no answer LMOM. Rx sent to pharm.  

## 2021-07-09 ENCOUNTER — Other Ambulatory Visit: Payer: Self-pay

## 2021-07-09 MED FILL — Pravastatin Sodium Tab 80 MG: ORAL | 30 days supply | Qty: 30 | Fill #0 | Status: CN

## 2021-07-09 MED FILL — Sildenafil Citrate Tab 100 MG: ORAL | 30 days supply | Qty: 10 | Fill #0 | Status: AC

## 2021-07-09 MED FILL — Losartan Potassium Tab 50 MG: ORAL | 30 days supply | Qty: 30 | Fill #0 | Status: AC

## 2021-07-09 MED FILL — Amlodipine Besylate Tab 10 MG (Base Equivalent): ORAL | 30 days supply | Qty: 30 | Fill #0 | Status: AC

## 2021-07-16 ENCOUNTER — Other Ambulatory Visit: Payer: Self-pay

## 2021-07-16 MED FILL — Pravastatin Sodium Tab 80 MG: ORAL | 30 days supply | Qty: 30 | Fill #0 | Status: AC

## 2021-08-05 ENCOUNTER — Ambulatory Visit (INDEPENDENT_AMBULATORY_CARE_PROVIDER_SITE_OTHER): Payer: Self-pay | Admitting: Nurse Practitioner

## 2021-08-05 ENCOUNTER — Other Ambulatory Visit: Payer: Self-pay

## 2021-08-05 ENCOUNTER — Ambulatory Visit: Payer: Self-pay | Admitting: Family Medicine

## 2021-08-05 DIAGNOSIS — Z538 Procedure and treatment not carried out for other reasons: Secondary | ICD-10-CM

## 2021-08-05 NOTE — Progress Notes (Signed)
   [facility] [address] [place], [place]  [address] Phone:  [phone]   Fax:  [phone] 

## 2021-08-06 ENCOUNTER — Ambulatory Visit (INDEPENDENT_AMBULATORY_CARE_PROVIDER_SITE_OTHER): Payer: Self-pay | Admitting: Nurse Practitioner

## 2021-08-06 ENCOUNTER — Other Ambulatory Visit: Payer: Self-pay

## 2021-08-06 ENCOUNTER — Encounter: Payer: Self-pay | Admitting: Nurse Practitioner

## 2021-08-06 VITALS — BP 135/98 | HR 94 | Temp 97.6°F | Ht 67.0 in | Wt 215.4 lb

## 2021-08-06 DIAGNOSIS — Z0000 Encounter for general adult medical examination without abnormal findings: Secondary | ICD-10-CM

## 2021-08-06 DIAGNOSIS — M25511 Pain in right shoulder: Secondary | ICD-10-CM

## 2021-08-06 DIAGNOSIS — E7849 Other hyperlipidemia: Secondary | ICD-10-CM

## 2021-08-06 LAB — POCT URINALYSIS DIP (CLINITEK)
Bilirubin, UA: NEGATIVE
Blood, UA: NEGATIVE
Glucose, UA: NEGATIVE mg/dL
Ketones, POC UA: NEGATIVE mg/dL
Leukocytes, UA: NEGATIVE
Nitrite, UA: NEGATIVE
POC PROTEIN,UA: NEGATIVE
Spec Grav, UA: <=1.005 — AB (ref 1.010–1.025)
Urobilinogen, UA: 0.2 E.U./dL
pH, UA: 6 (ref 5.0–8.0)

## 2021-08-06 MED ORDER — HYDROCODONE-ACETAMINOPHEN 10-325 MG PO TABS
1.0000 | ORAL_TABLET | Freq: Three times a day (TID) | ORAL | 0 refills | Status: DC | PRN
  Filled 2021-08-06: qty 15, 5d supply, fill #0

## 2021-08-06 NOTE — Patient Instructions (Signed)
You were seen today in the [facility] for right shoulder pain. Labs were collected, results will be available via MyChart or, if abnormal, you will be contacted by clinic staff. You were prescribed medications, please take as directed. Please follow up in 2 mths for reevaluation shoulder pain.  ?

## 2021-08-06 NOTE — Progress Notes (Signed)
[facility] Patient Michael Fleming - [place] [address], [place]  [address] Phone:  [phone]   Fax:  [phone] Subjective:   Patient ID: Michael Fleming, male    DOB: [birth year], 50 y.o.   MRN: [id number]  Chief Complaint  Patient presents with   Follow-up    Pt stated he is here for physical. Pt had surgery on his shoulder in November 22 pt would like pain medication for his shoulder.   HPI Michael Fleming 51 y.o. male  has a past medical history of Allergy, Biceps tendon tear, Heart palpitations (12/2018), Hyperlipidemia, Hypertension, Hypokalemia (12/2018), and Seizures ([facility]). To the [facility] for shoulder pain.   States that he was evaluated and treated by orthopedics for shoulder pain. Surgery was completed on affected shoulder on 11/9. Indicates that due to complications from the surgery, pain as worsened in right shoulder. States that pain increases with any changes in position. Has been taking prescribed tramadol with mild improvement in symptoms. Denies notifying orthopedics of continued pain with tramadol, has scheduled follow up next week. Patient endorses displacement of muscle in RUE. Also endorses "popping noise" with movement if shoulder. Denies any numbness or tingling. Denies any other concerns today. States that he has completed PT.  Denies any fever. Denies any fatigue, chest pain, shortness of breath, HA or dizziness. Denies any blurred vision, numbness or tingling.   Past Medical History:  Diagnosis Date   Allergy    Biceps tendon tear    right   Heart palpitations 12/2018   Hyperlipidemia    Hypertension    Hypokalemia 12/2018   Seizures ([facility])    last seizure age 28 due to blood clot- 1981 none since     Past Surgical History:  Procedure Laterality Date   BRAIN SURGERY     1981 blot clot removed   FOOT SURGERY     right bunion removed   KNEE ARTHROSCOPY Left    SHOULDER ARTHROSCOPY WITH SUBACROMIAL DECOMPRESSION AND BICEP TENDON REPAIR Right  03/31/2021   Procedure: RIGHT SHOULDER ARTHROSCOPY, EXTENSIVE DEBRIDEMENT, SUBACROMIAL DECOMPRESSION, OPEN BICEPS TENODESIS;  Surgeon: Tarry Kos, MD;  Location: [facility];  Service: Orthopedics;  Laterality: Right;   WRIST SURGERY Left     Family History  Problem Relation Age of Onset   Healthy Mother    Hypertension Father    Colon cancer Neg Hx    Colon polyps Neg Hx    Esophageal cancer Neg Hx    Rectal cancer Neg Hx    Stomach cancer Neg Hx     Social History   Socioeconomic History   Marital status: Single    Spouse name: Not on file   Number of children: Not on file   Years of education: Not on file   Highest education level: Not on file  Occupational History   Not on file  Tobacco Use   Smoking status: Never   Smokeless tobacco: Never  Vaping Use   Vaping Use: Never used  Substance and Sexual Activity   Alcohol use: Yes    Comment: occasional   Drug use: No   Sexual activity: Not Currently  Other Topics Concern   Not on file  Social History Narrative   Not on file   Social Determinants of Health   Financial Resource Strain: Not on file  Food Insecurity: Not on file  Transportation Needs: Not on file  Physical Activity: Not on file  Stress: Not on file  Social  Connections: Not on file  Intimate Partner Violence: Not on file    Outpatient Medications Prior to Visit  Medication Sig Dispense Refill   amLODipine (NORVASC) 10 MG tablet TAKE 1 TABLET (10 MG TOTAL) BY MOUTH DAILY. 30 tablet 11   losartan (COZAAR) 50 MG tablet TAKE 1 TABLET (50 MG TOTAL) BY MOUTH DAILY. 30 tablet 11   naproxen (NAPROSYN) 500 MG tablet Take 1 tablet (500 mg total) by mouth 3 (three) times daily as needed for moderate pain. 15 tablet 0   sildenafil (VIAGRA) 100 MG tablet TAKE 0.5-1 TABLETS (50-100 MG TOTAL) BY MOUTH DAILY AS NEEDED FOR ERECTILE DYSFUNCTION. 30 tablet 3   pravastatin (PRAVACHOL) 80 MG tablet TAKE 1 TABLET (80 MG TOTAL) BY MOUTH DAILY. 30 tablet  11   traMADol (ULTRAM) 50 MG tablet Take 1 tablet (50 mg total) by mouth 3 (three) times daily as needed. 30 tablet 2   cyclobenzaprine (FLEXERIL) 10 MG tablet Take 1 tablet (10 mg total) by mouth 3 (three) times daily as needed for muscle spasms (shoulder pain). (Patient not taking: Reported on 08/06/2021) 20 tablet 0   oxyCODONE-acetaminophen (PERCOCET) 5-325 MG tablet Take 1-2 tablets by mouth every 8 (eight) hours as needed for severe pain. (Patient not taking: Reported on 06/03/2021) 30 tablet 0   No facility-administered medications prior to visit.    No Known Allergies  Review of Systems  Constitutional:  Negative for chills, fever and malaise/fatigue.  Respiratory:  Negative for cough and shortness of breath.   Cardiovascular:  Negative for chest pain, palpitations and leg swelling.  Gastrointestinal:  Negative for abdominal pain, blood in stool, constipation, diarrhea, nausea and vomiting.  Musculoskeletal:  Positive for joint pain. Negative for back pain, falls, myalgias and neck pain.  Skin: Negative.   Neurological: Negative.   Psychiatric/Behavioral:  Negative for depression. The patient is not nervous/anxious.   All other systems reviewed and are negative.     Objective:    Physical Exam Constitutional:      General: He is not in acute distress.    Appearance: Normal appearance. He is obese.  HENT:     Head: Normocephalic.  Cardiovascular:     Rate and Rhythm: Normal rate and regular rhythm.     Pulses: Normal pulses.     Heart sounds: Normal heart sounds.     Comments: No obvious peripheral edema Pulmonary:     Effort: Pulmonary effort is normal.     Breath sounds: Normal breath sounds.  Musculoskeletal:        General: Tenderness present. No swelling, deformity or signs of injury. Normal range of motion.     Right lower leg: No edema.     Left lower leg: No edema.     Comments: Mild tenderness noted with palpation of diffuse right shoulder. FROM intact, patient  appears to be in pain while performing ROM with frequent grimacing   Skin:    General: Skin is warm and dry.     Capillary Refill: Capillary refill takes less than 2 seconds.  Neurological:     General: No focal deficit present.     Mental Status: He is alert and oriented to person, place, and time.  Psychiatric:        Mood and Affect: Mood normal.        Behavior: Behavior normal.        Thought Content: Thought content normal.        Judgment: Judgment normal.    BP Marland Kitchen)  135/98 (BP Location: Left Arm, Patient Position: Sitting)   Pulse 94   Temp 97.6 F (36.4 C)   Ht 5\' 7"  (1.702 m)   Wt 215 lb 6 oz (97.7 kg)   SpO2 100%   BMI 33.73 kg/m  Wt Readings from Last 3 Encounters:  08/06/21 215 lb 6 oz (97.7 kg)  03/31/21 211 lb 10.3 oz (96 kg)  02/10/21 211 lb 0.2 oz (95.7 kg)    Immunization History  Administered Date(s) Administered   Influenza,inj,Quad PF,6+ Mos 01/20/2017   PFIZER(Purple Top)SARS-COV-2 Vaccination 10/29/2019, 01/07/2020   Tdap 09/29/2016    Diabetic Foot Exam - Simple   No data filed     Lab Results  Component Value Date   TSH 0.764 08/05/2020   Lab Results  Component Value Date   WBC 6.7 08/06/2021   HGB 13.9 08/06/2021   HCT 41.2 08/06/2021   MCV 84 08/06/2021   PLT 323 08/06/2021   Lab Results  Component Value Date   NA 141 08/06/2021   K 4.4 08/06/2021   CO2 25 08/06/2021   GLUCOSE 117 (H) 08/06/2021   BUN 14 08/06/2021   CREATININE 1.24 08/06/2021   BILITOT 0.5 08/06/2021   ALKPHOS 93 08/06/2021   AST 28 08/06/2021   ALT 28 08/06/2021   PROT 7.6 08/06/2021   ALBUMIN 4.7 08/06/2021   CALCIUM 9.8 08/06/2021   ANIONGAP 10 01/08/2019   EGFR 70 08/06/2021   Lab Results  Component Value Date   CHOL 204 (H) 08/06/2021   CHOL 219 (H) 02/10/2021   CHOL 194 08/05/2020   Lab Results  Component Value Date   HDL 35 (L) 08/06/2021   HDL 44 02/10/2021   HDL 35 (L) 08/05/2020   Lab Results  Component Value Date   LDLCALC 149  (H) 08/06/2021   LDLCALC 155 (H) 02/10/2021   LDLCALC 140 (H) 08/05/2020   Lab Results  Component Value Date   TRIG 110 08/06/2021   TRIG 110 02/10/2021   TRIG 101 08/05/2020   Lab Results  Component Value Date   CHOLHDL 5.8 (H) 08/06/2021   CHOLHDL 5.0 02/10/2021   CHOLHDL 5.5 (H) 08/05/2020   Lab Results  Component Value Date   HGBA1C 5.6 02/10/2021   HGBA1C 5.9 (H) 08/05/2020   HGBA1C 5.5 08/05/2019       Assessment & Plan:   Problem List Items Addressed This Visit       Other   Hyperlipidemia   Relevant Medications   atorvastatin (LIPITOR) 40 MG tablet Encouraged continued diet and exercise efforts  Encouraged continued compliance with medication     Other Visit Diagnoses     Health care maintenance    -  Primary   Relevant Orders   POCT URINALYSIS DIP (CLINITEK) (Completed)   CBC with Differential/Platelet (Completed)   Comprehensive metabolic panel (Completed)   Lipid panel (Completed)   Acute pain of right shoulder       Relevant Medications   HYDROcodone-acetaminophen (NORCO) 10-325 MG tablet, initiated during visit Discussed non pharmacological methods for management of symptoms Informed to take OTC medications as needed Encouraged to maintain follow up appointment with orthopedics     Follow up in 2 mths for reevaluation of shoulder pain, sooner as needed    I have discontinued Juanpablo L. Pense's pravastatin, oxyCODONE-acetaminophen, and traMADol. I am also having him start on HYDROcodone-acetaminophen and atorvastatin. Additionally, I am having him maintain his sildenafil, losartan, amLODipine, naproxen, and cyclobenzaprine.  Meds ordered this encounter  Medications   HYDROcodone-acetaminophen (NORCO) 10-325 MG tablet    Sig: Take 1 tablet by mouth every 8 (eight) hours as needed for up to 5 days.    Dispense:  15 tablet    Refill:  0   atorvastatin (LIPITOR) 40 MG tablet    Sig: Take 1 tablet (40 mg total) by mouth daily.    Dispense:  30  tablet    Refill:  2     Kathrynn Speed, NP

## 2021-08-07 LAB — CBC WITH DIFFERENTIAL/PLATELET
Basophils Absolute: 0.1 10*3/uL (ref 0.0–0.2)
Basos: 1 %
EOS (ABSOLUTE): 0.1 10*3/uL (ref 0.0–0.4)
Eos: 1 %
Hematocrit: 41.2 % (ref 37.5–51.0)
Hemoglobin: 13.9 g/dL (ref 13.0–17.7)
Immature Grans (Abs): 0 10*3/uL (ref 0.0–0.1)
Immature Granulocytes: 0 %
Lymphocytes Absolute: 3.2 10*3/uL — ABNORMAL HIGH (ref 0.7–3.1)
Lymphs: 48 %
MCH: 28.3 pg (ref 26.6–33.0)
MCHC: 33.7 g/dL (ref 31.5–35.7)
MCV: 84 fL (ref 79–97)
Monocytes Absolute: 0.5 10*3/uL (ref 0.1–0.9)
Monocytes: 7 %
Neutrophils Absolute: 2.8 10*3/uL (ref 1.4–7.0)
Neutrophils: 43 %
Platelets: 323 10*3/uL (ref 150–450)
RBC: 4.92 x10E6/uL (ref 4.14–5.80)
RDW: 12.6 % (ref 11.6–15.4)
WBC: 6.7 10*3/uL (ref 3.4–10.8)

## 2021-08-07 LAB — COMPREHENSIVE METABOLIC PANEL
ALT: 28 IU/L (ref 0–44)
AST: 28 IU/L (ref 0–40)
Albumin/Globulin Ratio: 1.6 (ref 1.2–2.2)
Albumin: 4.7 g/dL (ref 3.8–4.9)
Alkaline Phosphatase: 93 IU/L (ref 44–121)
BUN/Creatinine Ratio: 11 (ref 9–20)
BUN: 14 mg/dL (ref 6–24)
Bilirubin Total: 0.5 mg/dL (ref 0.0–1.2)
CO2: 25 mmol/L (ref 20–29)
Calcium: 9.8 mg/dL (ref 8.7–10.2)
Chloride: 103 mmol/L (ref 96–106)
Creatinine, Ser: 1.24 mg/dL (ref 0.76–1.27)
Globulin, Total: 2.9 g/dL (ref 1.5–4.5)
Glucose: 117 mg/dL — ABNORMAL HIGH (ref 70–99)
Potassium: 4.4 mmol/L (ref 3.5–5.2)
Sodium: 141 mmol/L (ref 134–144)
Total Protein: 7.6 g/dL (ref 6.0–8.5)
eGFR: 70 mL/min/{1.73_m2} (ref >59)

## 2021-08-07 LAB — LIPID PANEL
Chol/HDL Ratio: 5.8 ratio — ABNORMAL HIGH (ref 0.0–5.0)
Cholesterol, Total: 204 mg/dL — ABNORMAL HIGH (ref 100–199)
HDL: 35 mg/dL — ABNORMAL LOW (ref >39)
LDL Chol Calc (NIH): 149 mg/dL — ABNORMAL HIGH (ref 0–99)
Triglycerides: 110 mg/dL (ref 0–149)
VLDL Cholesterol Cal: 20 mg/dL (ref 5–40)

## 2021-08-08 MED ORDER — ATORVASTATIN CALCIUM 40 MG PO TABS
40.0000 mg | ORAL_TABLET | Freq: Every day | ORAL | 2 refills | Status: DC
  Filled 2021-08-08 – 2021-08-24 (×2): qty 30, 30d supply, fill #0
  Filled 2021-10-01: qty 60, 60d supply, fill #1

## 2021-08-09 ENCOUNTER — Other Ambulatory Visit: Payer: Self-pay

## 2021-08-10 ENCOUNTER — Encounter: Payer: Self-pay | Admitting: Orthopaedic Surgery

## 2021-08-10 ENCOUNTER — Other Ambulatory Visit: Payer: Self-pay

## 2021-08-10 ENCOUNTER — Other Ambulatory Visit: Payer: Self-pay | Admitting: Nurse Practitioner

## 2021-08-10 ENCOUNTER — Ambulatory Visit (INDEPENDENT_AMBULATORY_CARE_PROVIDER_SITE_OTHER): Payer: Self-pay | Admitting: Orthopaedic Surgery

## 2021-08-10 ENCOUNTER — Telehealth: Payer: Self-pay

## 2021-08-10 DIAGNOSIS — M25511 Pain in right shoulder: Secondary | ICD-10-CM

## 2021-08-10 DIAGNOSIS — G8929 Other chronic pain: Secondary | ICD-10-CM

## 2021-08-10 MED ORDER — LIDOCAINE HCL 2 % IJ SOLN
2.0000 mL | INTRAMUSCULAR | Status: AC | PRN
  Administered 2021-08-10: 2 mL

## 2021-08-10 MED ORDER — METHYLPREDNISOLONE ACETATE 40 MG/ML IJ SUSP
40.0000 mg | INTRAMUSCULAR | Status: AC | PRN
  Administered 2021-08-10: 40 mg via INTRA_ARTICULAR

## 2021-08-10 MED ORDER — BUPIVACAINE HCL 0.25 % IJ SOLN
2.0000 mL | INTRAMUSCULAR | Status: AC | PRN
  Administered 2021-08-10: 2 mL via INTRA_ARTICULAR

## 2021-08-10 MED ORDER — HYDROCODONE-ACETAMINOPHEN 10-325 MG PO TABS: 1.0000 | ORAL_TABLET | Freq: Three times a day (TID) | ORAL | 0 refills | Status: AC | PRN

## 2021-08-10 NOTE — Progress Notes (Signed)
? ?Office Visit Note ?  ?Patient: Michael Fleming           ?Date of Birth: 08-30-1970           ?MRN: [id number] ?Visit Date: 08/10/2021 ?             ?Requested by: Orion Crook I, NP ?[address] ?[place],  [place] [address] ?PCP: Orion Crook I, NP ? ? ?Assessment & Plan: ?Visit Diagnoses:  ?1. Chronic right shoulder pain   ? ? ?Plan: Impression is right shoulder pain likely coming from the rotator cuff rather than the biceps even though he has a small Popeye deformity.  I have suggested proceeding with cortisone injection at this time.  If his symptoms continue to persist over the next few weeks she will follow-up for recheck.  Otherwise, follow-up with [place] as needed. ? ?Follow-Up Instructions: Return if symptoms worsen or fail to improve.  ? ?Orders:  ?Orders Placed This Encounter  ?Procedures  ? Large Joint Inj: R subacromial bursa  ? ?No orders of the defined types were placed in this encounter. ? ? ? ? Procedures: ?Large Joint Inj: R subacromial bursa on 08/10/2021 8:54 AM ?Indications: pain ?Details: 22 G needle ?Medications: 2 mL lidocaine 2 %; 2 mL bupivacaine 0.25 %; 40 mg methylPREDNISolone acetate 40 MG/ML ?Outcome: tolerated well, no immediate complications ?Patient was prepped and draped in the usual sterile fashion.  ? ? ? ? ?Clinical Data: ?No additional findings. ? ? ?Subjective: ?Chief Complaint  ?Patient presents with  ? Right Shoulder - Pain, Follow-up  ? ? ?HPI patient is a pleasant 51 year old gentleman who comes in today nearly 4-1/2 months status post right shoulder arthroscopic debridement, decompression and open biceps tenodesis, date of surgery 03/31/2021.  He was doing well until about 2 months ago when his pain returned.  He denies any new injury or change in activity.  Pain is located to the entire shoulder.  Pain is worse when applying pressure to the posterior aspect as well as with any external rotation.  He has been taking tramadol with mild relief. ? ?Review of Systems as  detailed in HPI.  All others are negative. ? ? ?Objective: ?Vital Signs: There were no vitals taken for this visit. ? ?Physical Exam well-developed well-nourished gentleman in no acute distress.  Alert and oriented x3. ? ?Ortho Exam right shoulder exam shows near full active range of motion in all planes.  Internal rotation to L5.  Small popeye deformity.  Does have a positive empty can test.  No pain with speeds test.  Negative drop arm.  He does have near full strength throughout.  He is neurovascular intact distally. ? ?Specialty Comments:  ?No specialty comments available. ? ?Imaging: ?No new imaging ? ? ?PMFS History: ?Patient Active Problem List  ? Diagnosis Date Noted  ? Tendinopathy of right biceps tendon   ? Degenerative 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 shoulder   ? Tendinosis of rotator cuff   ? Traumatic partial tear of right biceps tendon 03/04/2021  ? Partial tear of right subscapularis tendon 03/04/2021  ? Nontraumatic tear of supraspinatus tendon, right 03/04/2021  ? Hyperlipidemia 02/05/2019  ? Hypokalemia 02/05/2019  ? Vitamin D deficiency 02/05/2019  ? Tick bite of groin 11/27/2018  ? Back pain 08/06/2018  ? Blurry vision 08/06/2018  ? Pain in right wrist 03/27/2017  ? Chronic pain of left knee 03/27/2017  ? HTN (hypertension) 10/23/2016  ? History of brain surgery 10/23/2016  ?  Chronic headache 10/23/2016  ? ?Past Medical History:  ?Diagnosis Date  ? Allergy   ? Biceps tendon tear   ? right  ? Heart palpitations 12/2018  ? Hyperlipidemia   ? Hypertension   ? Hypokalemia 12/2018  ? Seizures ([facility])   ? last seizure age 34 due to blood clot- 1981 none since   ?  ?Family History  ?Problem Relation Age of Onset  ? Healthy Mother   ? Hypertension Father   ? Colon cancer Neg Hx   ? Colon polyps Neg Hx   ? Esophageal cancer Neg Hx   ? Rectal cancer Neg Hx   ? Stomach cancer Neg Hx   ?  ?Past Surgical History:  ?Procedure Laterality Date  ? BRAIN SURGERY    ? 1981 blot clot removed   ? FOOT SURGERY    ? right bunion removed  ? KNEE ARTHROSCOPY Left   ? SHOULDER ARTHROSCOPY WITH SUBACROMIAL DECOMPRESSION AND BICEP TENDON REPAIR Right 03/31/2021  ? Procedure: RIGHT SHOULDER ARTHROSCOPY, EXTENSIVE DEBRIDEMENT, SUBACROMIAL DECOMPRESSION, OPEN BICEPS TENODESIS;  Surgeon: Tarry Kos, MD;  Location: [facility];  Service: Orthopedics;  Laterality: Right;  ? WRIST SURGERY Left   ? ?Social History  ? ?Occupational History  ? Not on file  ?Tobacco Use  ? Smoking status: Never  ? Smokeless tobacco: Never  ?Vaping Use  ? Vaping Use: Never used  ?Substance and Sexual Activity  ? Alcohol use: Yes  ?  Comment: occasional  ? Drug use: No  ? Sexual activity: Not Currently  ? ? ? ? ? ? ?

## 2021-08-10 NOTE — Telephone Encounter (Signed)
Hydrocodone ? ? ?[facility] ? ?[place] [address] ? ? ?[facility] doesn't give narc's   ?

## 2021-08-24 ENCOUNTER — Other Ambulatory Visit: Payer: Self-pay

## 2021-08-24 ENCOUNTER — Other Ambulatory Visit: Payer: Self-pay | Admitting: Nurse Practitioner

## 2021-08-24 DIAGNOSIS — N529 Male erectile dysfunction, unspecified: Secondary | ICD-10-CM

## 2021-08-24 DIAGNOSIS — I10 Essential (primary) hypertension: Secondary | ICD-10-CM

## 2021-08-24 MED ORDER — AMLODIPINE BESYLATE 10 MG PO TABS
ORAL_TABLET | Freq: Every day | ORAL | 1 refills | Status: DC
  Filled 2021-08-24: qty 30, 30d supply, fill #0
  Filled 2021-10-01: qty 30, 30d supply, fill #1

## 2021-08-24 MED ORDER — LOSARTAN POTASSIUM 50 MG PO TABS
ORAL_TABLET | Freq: Every day | ORAL | 1 refills | Status: DC
  Filled 2021-08-24: qty 30, 30d supply, fill #0
  Filled 2021-10-01: qty 30, 30d supply, fill #1

## 2021-10-01 ENCOUNTER — Other Ambulatory Visit: Payer: Self-pay

## 2021-10-06 ENCOUNTER — Ambulatory Visit (INDEPENDENT_AMBULATORY_CARE_PROVIDER_SITE_OTHER): Payer: Self-pay | Admitting: Nurse Practitioner

## 2021-10-06 ENCOUNTER — Encounter: Payer: Self-pay | Admitting: Nurse Practitioner

## 2021-10-06 ENCOUNTER — Other Ambulatory Visit: Payer: Self-pay

## 2021-10-06 VITALS — BP 138/89 | HR 79 | Temp 98.2°F | Ht 67.0 in | Wt 210.6 lb

## 2021-10-06 DIAGNOSIS — M25511 Pain in right shoulder: Secondary | ICD-10-CM

## 2021-10-06 DIAGNOSIS — G8929 Other chronic pain: Secondary | ICD-10-CM

## 2021-10-06 DIAGNOSIS — N529 Male erectile dysfunction, unspecified: Secondary | ICD-10-CM

## 2021-10-06 DIAGNOSIS — Z760 Encounter for issue of repeat prescription: Secondary | ICD-10-CM

## 2021-10-06 MED ORDER — SILDENAFIL CITRATE 100 MG PO TABS
ORAL_TABLET | ORAL | 3 refills | Status: DC
  Filled 2021-10-06: qty 10, 30d supply, fill #0
  Filled 2021-12-16: qty 10, 30d supply, fill #1

## 2021-10-06 NOTE — Patient Instructions (Signed)
You were seen today in the [facility] for right shoulder pain follow up. You were prescribed medications, please take as directed. Please follow up in 6 mths for wellness visit ?

## 2021-10-06 NOTE — Progress Notes (Signed)
Michael Fleming, [place]  [address] Phone:  [phone]   Fax:  [phone] Subjective:   Patient ID: Michael Fleming, male    DOB: August 31, 1970, 51 y.o.   MRN: [id number]  Chief Complaint  Patient presents with   Follow-up    Pt is here for 2 month follow.pt stated he is still having Rt shoulder pain and lower back pain started yesterday the pain radiates pt said it hard walk.. pt needs a refill on sildenafil   HPI Michael Fleming 51 y.o. male  has a past medical history of Allergy, Biceps tendon tear, Heart palpitations (12/2018), Hyperlipidemia, Hypertension, Hypokalemia (12/2018), and Seizures ([place]). To the [facility] for reevaluation of right shoulder pain and medication refill.  Patient states that since last visit, he has completed follow up with orthopedics. Patient received cortisone injection and instructions to follow up in 3 wks. Has not completed additional follow up, would like a second opinion from another orthopedic specialist. Patient would also like referral to pain management. Norco helped with pain management, but he continues to have pain with lifting and certain movements.   Patient also requesting refill of Viagra. Denies any other concerns today.   Past Medical History:  Diagnosis Date   Allergy    Biceps tendon tear    right   Heart palpitations 12/2018   Hyperlipidemia    Hypertension    Hypokalemia 12/2018   Seizures ([place])    last seizure age 39 due to blood clot- 1981 none since     Past Surgical History:  Procedure Laterality Date   BRAIN SURGERY     1981 blot clot removed   FOOT SURGERY     right bunion removed   KNEE ARTHROSCOPY Left    SHOULDER ARTHROSCOPY WITH SUBACROMIAL DECOMPRESSION AND BICEP TENDON REPAIR Right 03/31/2021   Procedure: RIGHT SHOULDER ARTHROSCOPY, EXTENSIVE DEBRIDEMENT, SUBACROMIAL DECOMPRESSION, OPEN BICEPS TENODESIS;  Surgeon: Leandrew Koyanagi, MD;  Location: [facility];   Service: Orthopedics;  Laterality: Right;   WRIST SURGERY Left     Family History  Problem Relation Age of Onset   Healthy Mother    Hypertension Father    Colon cancer Neg Hx    Colon polyps Neg Hx    Esophageal cancer Neg Hx    Rectal cancer Neg Hx    Stomach cancer Neg Hx     Social History   Socioeconomic History   Marital status: Single    Spouse name: Not on file   Number of children: Not on file   Years of education: Not on file   Highest education level: Not on file  Occupational History   Not on file  Tobacco Use   Smoking status: Never   Smokeless tobacco: Never  Vaping Use   Vaping Use: Never used  Substance and Sexual Activity   Alcohol use: Yes    Comment: occasional   Drug use: No   Sexual activity: Not Currently  Other Topics Concern   Not on file  Social History Narrative   Not on file   Social Determinants of Health   Financial Resource Strain: Not on file  Food Insecurity: Not on file  Transportation Needs: Not on file  Physical Activity: Not on file  Stress: Not on file  Social Connections: Not on file  Intimate Partner Violence: Not on file    Outpatient Medications Prior to Visit  Medication Sig Dispense Refill   amLODipine (  NORVASC) 10 MG tablet TAKE 1 TABLET (10 MG TOTAL) BY MOUTH DAILY. 30 tablet 1   atorvastatin (LIPITOR) 40 MG tablet Take 1 tablet (40 mg total) by mouth daily. 30 tablet 2   losartan (COZAAR) 50 MG tablet TAKE 1 TABLET (50 MG TOTAL) BY MOUTH DAILY. 30 tablet 1   cyclobenzaprine (FLEXERIL) 10 MG tablet Take 1 tablet (10 mg total) by mouth 3 (three) times daily as needed for muscle spasms (shoulder pain). (Patient not taking: Reported on 08/06/2021) 20 tablet 0   naproxen (NAPROSYN) 500 MG tablet Take 1 tablet (500 mg total) by mouth 3 (three) times daily as needed for moderate pain. (Patient not taking: Reported on 10/06/2021) 15 tablet 0   sildenafil (VIAGRA) 100 MG tablet TAKE 0.5-1 TABLETS (50-100 MG TOTAL) BY MOUTH  DAILY AS NEEDED FOR ERECTILE DYSFUNCTION. 30 tablet 3   No facility-administered medications prior to visit.    No Known Allergies  Review of Systems  Constitutional: Negative.  Negative for chills, fever and malaise/fatigue.  Respiratory:  Negative for cough and shortness of breath.   Cardiovascular:  Negative for chest pain, palpitations and leg swelling.  Gastrointestinal:  Negative for abdominal pain, blood in stool, constipation, diarrhea, nausea and vomiting.  Musculoskeletal:  Positive for joint pain. Negative for back pain, falls, myalgias and neck pain.  Skin: Negative.   Neurological: Negative.   Psychiatric/Behavioral:  Negative for depression. The patient is not nervous/anxious.   All other systems reviewed and are negative.     Objective:    Physical Exam Vitals reviewed.  Constitutional:      General: He is not in acute distress.    Appearance: Normal appearance. He is obese.  HENT:     Head: Normocephalic.  Neck:     Vascular: No carotid bruit.  Cardiovascular:     Rate and Rhythm: Normal rate and regular rhythm.     Pulses: Normal pulses.     Heart sounds: Normal heart sounds.     Comments: No obvious peripheral edema Pulmonary:     [place]ff[place]: Pulmonary effort is normal.     Breath sounds: Normal breath sounds.  Musculoskeletal:        General: No swelling, tenderness, deformity or signs of injury. Normal range of motion.     Cervical back: Normal range of motion and neck supple. No rigidity or tenderness.     Right lower leg: No edema.     Left lower leg: No edema.  Lymphadenopathy:     Cervical: No cervical adenopathy.  Skin:    General: Skin is warm and dry.     Capillary Refill: Capillary refill takes less than 2 seconds.  Neurological:     General: No focal deficit present.     Mental Status: He is alert and oriented to person, place, and time.  Psychiatric:        Mood and Affect: Mood normal.        Behavior: Behavior normal.        Thought  Content: Thought content normal.        Judgment: Judgment normal.    BP 138/89 (BP Location: Right Arm, Patient Position: Sitting, Cuff Size: Large)   Pulse 79   Temp 98.2 F (36.8 C)   Ht _0  (1.702 m)   Wt 210 lb 9.6 oz (95.5 kg)   SpO2 100%   BMI 32.98 kg/m  Wt Readings from Last 3 Encounters:  10/06/21 210 lb 9.6 oz (95.5 kg)  08/06/21  215 lb 6 oz (97.7 kg)  03/31/21 211 lb 10.3 oz (96 kg)    Immunization History  Administered Date(s) Administered   Influenza,inj,Quad PF,6+ Mos 01/20/2017   PFIZER(Purple Top)SARS-COV-2 Vaccination 10/29/2019, 01/07/2020   Tdap 09/29/2016    Diabetic Foot Exam - Simple   No data filed     Lab Results  Component Value Date   TSH 0.764 08/05/2020   Lab Results  Component Value Date   WBC 6.7 08/06/2021   HGB 13.9 08/06/2021   HCT 41.2 08/06/2021   MCV 84 08/06/2021   PLT 323 08/06/2021   Lab Results  Component Value Date   NA 141 08/06/2021   K 4.4 08/06/2021   CO2 25 08/06/2021   GLUCOSE 117 (H) 08/06/2021   BUN 14 08/06/2021   CREATININE 1.24 08/06/2021   BILITOT 0.5 08/06/2021   ALKPHOS 93 08/06/2021   AST 28 08/06/2021   ALT 28 08/06/2021   PROT 7.6 08/06/2021   ALBUMIN 4.7 08/06/2021   CALCIUM 9.8 08/06/2021   ANIONGAP 10 01/08/2019   EGFR 70 08/06/2021   Lab Results  Component Value Date   CHOL 204 (H) 08/06/2021   CHOL 219 (H) 02/10/2021   CHOL 194 08/05/2020   Lab Results  Component Value Date   HDL 35 (L) 08/06/2021   HDL 44 02/10/2021   HDL 35 (L) 08/05/2020   Lab Results  Component Value Date   LDLCALC 149 (H) 08/06/2021   LDLCALC 155 (H) 02/10/2021   LDLCALC 140 (H) 08/05/2020   Lab Results  Component Value Date   TRIG 110 08/06/2021   TRIG 110 02/10/2021   TRIG 101 08/05/2020   Lab Results  Component Value Date   CHOLHDL 5.8 (H) 08/06/2021   CHOLHDL 5.0 02/10/2021   CHOLHDL 5.5 (H) 08/05/2020   Lab Results  Component Value Date   HGBA1C 5.6 02/10/2021   HGBA1C 5.9 (H)  08/05/2020   HGBA1C 5.5 08/05/2019       Assessment & Plan:   Problem List Items Addressed This Visit   None Visit Diagnoses     Chronic right shoulder pain    -  Primary   Relevant Orders   Ambulatory referral to Orthopedic Surgery   Ambulatory referral to Pain Clinic   Ambulatory referral to Physical Therapy Continue taking previously prescribed medications for pain  Discussed non pharmacological methods for management of symptoms Informed to take OTC medications as needed    Medication refill       Relevant Medications   sildenafil (VIAGRA) 100 MG tablet Refilled without changes    Erectile dysfunction, unspecified erectile dysfunction type       Relevant Medications   sildenafil (VIAGRA) 100 MG tablet Refilled without changes    Follow up in 6 mths for wellness visit, sooner as needed     I am having Marquail L. Hanratty maintain his naproxen, cyclobenzaprine, atorvastatin, losartan, amLODipine, and sildenafil.  Meds ordered this encounter  Medications   sildenafil (VIAGRA) 100 MG tablet    Sig: TAKE 0.5-1 TABLETS (50-100 MG TOTAL) BY MOUTH DAILY AS NEEDED FOR ERECTILE DYSFUNCTION.    Dispense:  30 tablet    Refill:  3     Teena Dunk, NP

## 2021-10-08 ENCOUNTER — Other Ambulatory Visit: Payer: Self-pay

## 2021-10-12 ENCOUNTER — Encounter: Payer: Self-pay | Admitting: Orthopaedic Surgery

## 2021-10-12 ENCOUNTER — Other Ambulatory Visit: Payer: Self-pay

## 2021-10-12 ENCOUNTER — Ambulatory Visit (INDEPENDENT_AMBULATORY_CARE_PROVIDER_SITE_OTHER): Payer: Self-pay | Admitting: Orthopaedic Surgery

## 2021-10-12 ENCOUNTER — Ambulatory Visit (INDEPENDENT_AMBULATORY_CARE_PROVIDER_SITE_OTHER): Payer: Self-pay

## 2021-10-12 DIAGNOSIS — G8929 Other chronic pain: Secondary | ICD-10-CM

## 2021-10-12 DIAGNOSIS — M25511 Pain in right shoulder: Secondary | ICD-10-CM

## 2021-10-12 NOTE — Progress Notes (Signed)
Office Visit Note   Patient: Michael Fleming           Date of Birth: October 24, 1970           MRN: [id number] Visit Date: 10/12/2021              Requested by: Orion Crook I, NP [address], [place],  [place] [address] PCP: Orion Crook I, NP   Assessment & Plan: Visit Diagnoses:  1. Chronic right shoulder pain     Plan: X-rays obtained today are negative.  I feel that his symptoms are related to rotator cuff tendinitis.  He wants reassurance that the biceps tenodesis is intact therefore we will order an MRI to assess this.  Follow-up after the MRI.  Total face to face encounter time was greater than 25 minutes and over half of this time was spent in counseling and/or coordination of care.  Follow-Up Instructions: No follow-ups on file.   Orders:  Orders Placed This Encounter  Procedures   XR Shoulder Right   MR SHOULDER RIGHT WO CONTRAST   No orders of the defined types were placed in this encounter.     Procedures: No procedures performed   Clinical Data: No additional findings.   Subjective: Chief Complaint  Patient presents with   Right Shoulder - Pain    HPI Rhylen returns today for follow-up of right shoulder pain.  This was injected about 2 months ago.  He says he receive no relief.  He denies any constant or nighttime pain.  Pain is mainly with exertion.  Denies any injuries. Review of Systems   Objective: Vital Signs: There were no vitals taken for this visit.  Physical Exam  Ortho Exam Examination of the right shoulder is unchanged.  He has good strength without any guarding to manual muscle testing of the rotator cuff. Specialty Comments:  No specialty comments available.  Imaging: XR Shoulder Right  Result Date: 10/12/2021 No acute or structural abnormalities    PMFS History: Patient Active Problem List   Diagnosis Date Noted   Tendinopathy of right biceps tendon    Degenerative superior 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)  tear of right shoulder    Tendinosis of rotator cuff    Traumatic partial tear of right biceps tendon 03/04/2021   Partial tear of right subscapularis tendon 03/04/2021   Nontraumatic tear of supraspinatus tendon, right 03/04/2021   Hyperlipidemia 02/05/2019   Hypokalemia 02/05/2019   Vitamin D deficiency 02/05/2019   Tick bite of groin 11/27/2018   Back pain 08/06/2018   Blurry vision 08/06/2018   Pain in right wrist 03/27/2017   Chronic pain of left knee 03/27/2017   HTN (hypertension) 10/23/2016   History of brain surgery 10/23/2016   Chronic headache 10/23/2016   Past Medical History:  Diagnosis Date   Allergy    Biceps tendon tear    right   Heart palpitations 12/2018   Hyperlipidemia    Hypertension    Hypokalemia 12/2018   Seizures ([facility])    last seizure age 35 due to blood clot- 1981 none since     Family History  Problem Relation Age of Onset   Healthy Mother    Hypertension Father    Colon cancer Neg Hx    Colon polyps Neg Hx    Esophageal cancer Neg Hx    Rectal cancer Neg Hx    Stomach cancer Neg Hx     Past Surgical History:  Procedure Laterality Date   BRAIN  SURGERY     1981 blot clot removed   FOOT SURGERY     right bunion removed   KNEE ARTHROSCOPY Left    SHOULDER ARTHROSCOPY WITH SUBACROMIAL DECOMPRESSION AND BICEP TENDON REPAIR Right 03/31/2021   Procedure: RIGHT SHOULDER ARTHROSCOPY, EXTENSIVE DEBRIDEMENT, SUBACROMIAL DECOMPRESSION, OPEN BICEPS TENODESIS;  Surgeon: Tarry Kos, MD;  Location: [facility];  Service: Orthopedics;  Laterality: Right;   WRIST SURGERY Left    Social History   Occupational History   Not on file  Tobacco Use   Smoking status: Never   Smokeless tobacco: Never  Vaping Use   Vaping Use: Never used  Substance and Sexual Activity   Alcohol use: Yes    Comment: occasional   Drug use: No   Sexual activity: Not Currently

## 2021-10-29 ENCOUNTER — Ambulatory Visit
Admission: RE | Admit: 2021-10-29 | Discharge: 2021-10-29 | Disposition: A | Discharge status: Home / Self Care | Payer: Self-pay | Source: Ambulatory Visit | Attending: Orthopaedic Surgery | Admitting: Orthopaedic Surgery

## 2021-10-29 DIAGNOSIS — G8929 Other chronic pain: Secondary | ICD-10-CM

## 2021-10-29 IMAGING — MR MR SHOULDER*R* W/O CM
4 of 5 series · 25 of 40 positions shown · non-contrast
Comparison: Right shoulder radiographs [DATE]; MRI right
shoulder [DATE]

CLINICAL DATA: Shoulder pain.  Rotator cuff disorder suspected.

EXAM:
MRI OF THE RIGHT SHOULDER WITHOUT CONTRAST
TECHNIQUE: Multiplanar, multisequence MR imaging of the shoulder was performed.
No intravenous contrast was administered.

[Series 3: T2 fat-sat · axial · 4.0mm · 0.31mm/px · z∈[-69,+68]mm · 8 of 30 slices shown (1 of 3)]
[im 1/30]
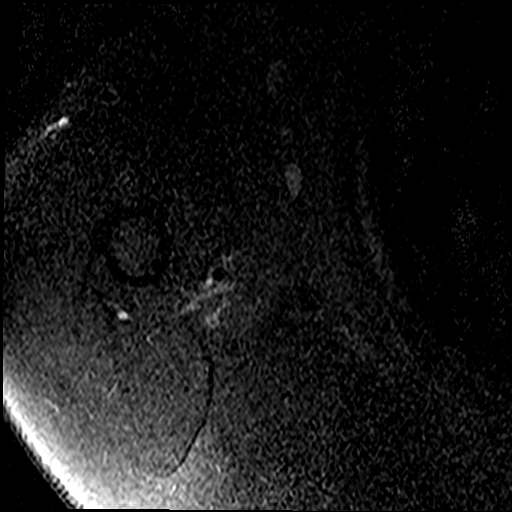
[im 4/30]
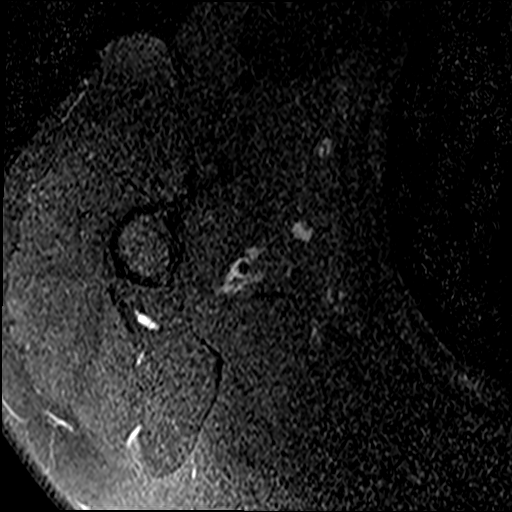
[im 10/30]
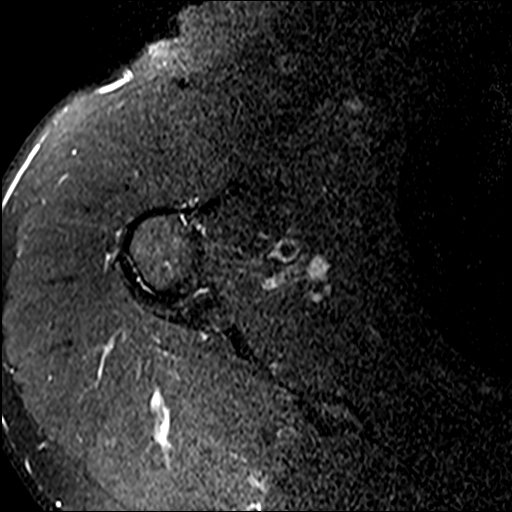
[im 13/30]
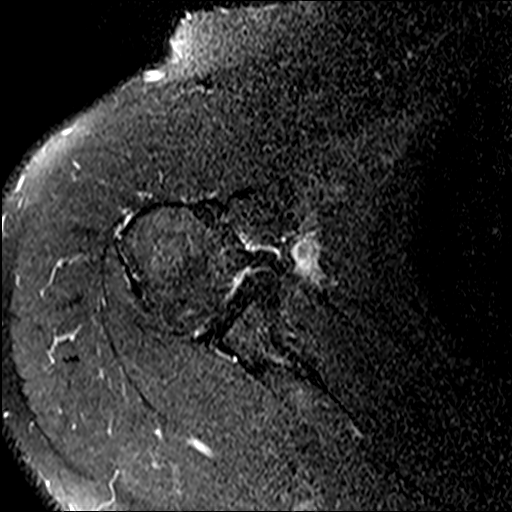
[im 17/30]
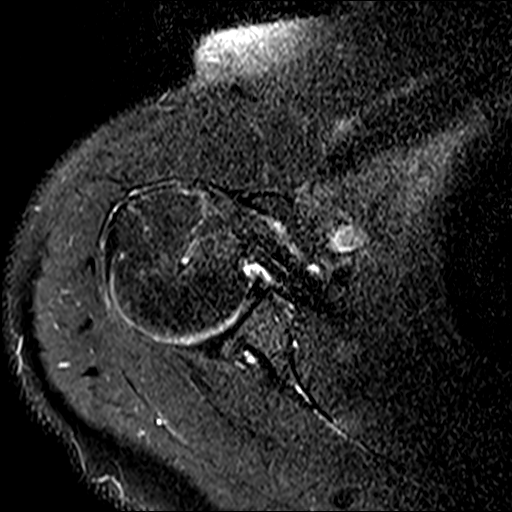
[im 20/30]
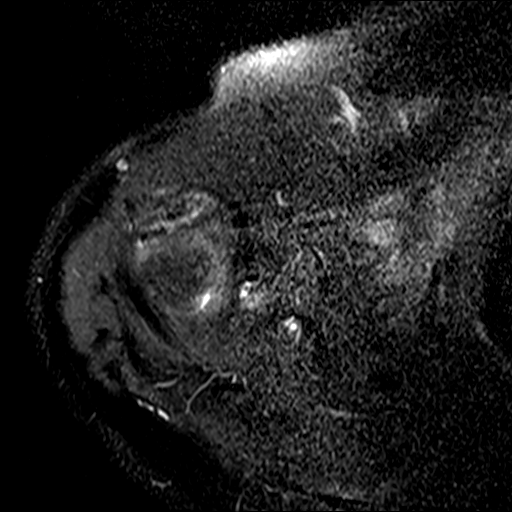
[im 26/30]
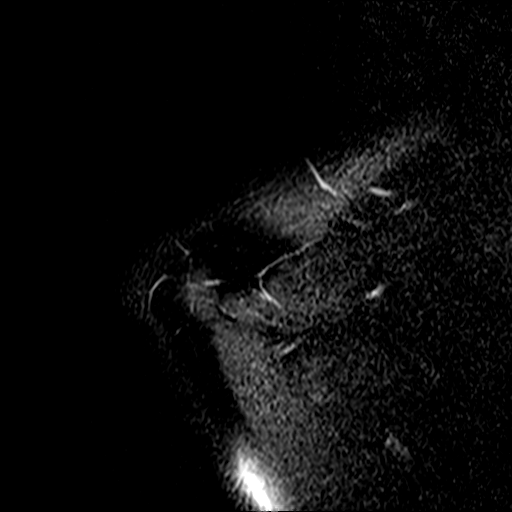
[im 30/30]
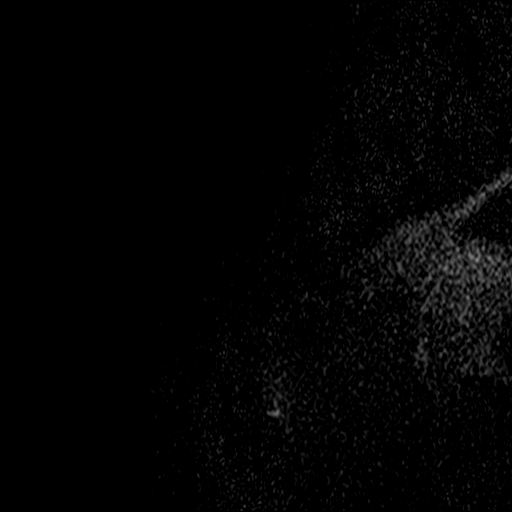

[Series 4: T2 fat-sat · oblique · 4.0mm · 0.62mm/px · 7 of 24 slices shown (2 of 3)]
[im 1/24]
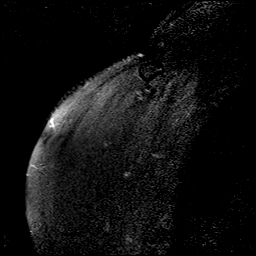
[im 4/24]
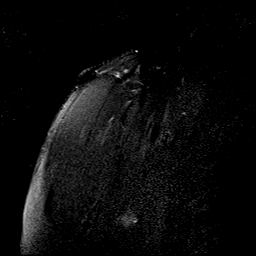
[im 8/24]
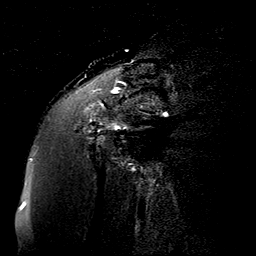
[im 12/24]
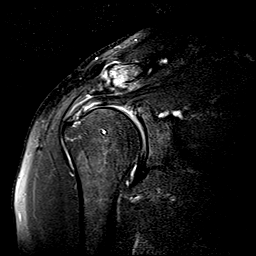
[im 16/24]
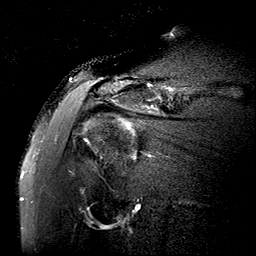
[im 20/24]
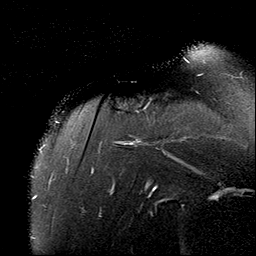
[im 24/24]
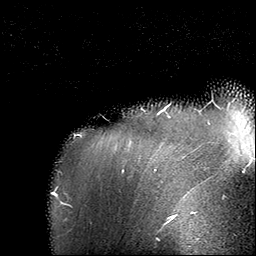

[Series 5: PD · oblique · 4.0mm · 0.31mm/px · 7 of 24 slices shown]
[im 1/24]
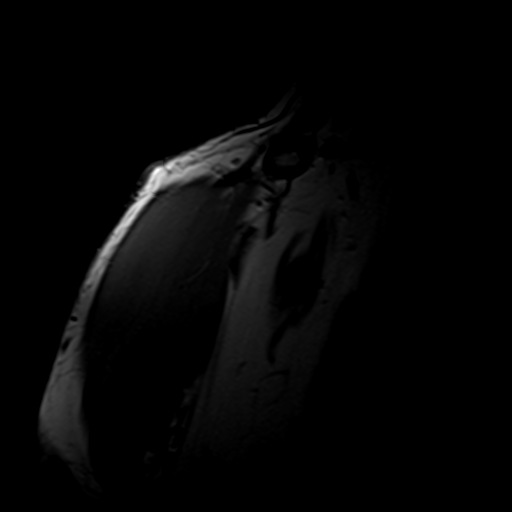
[im 4/24]
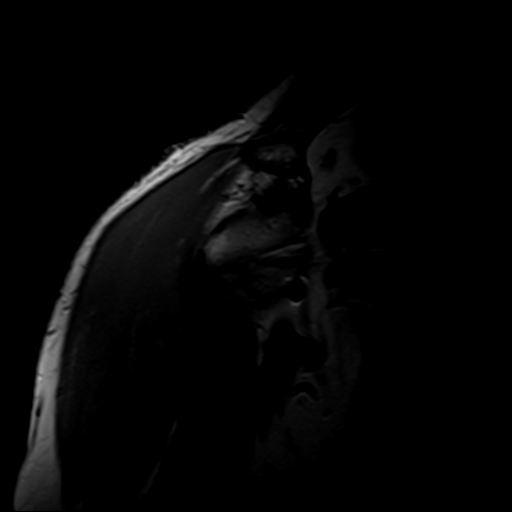
[im 8/24]
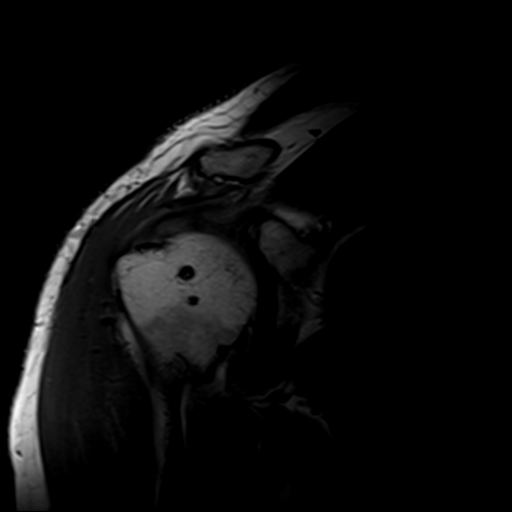
[im 12/24]
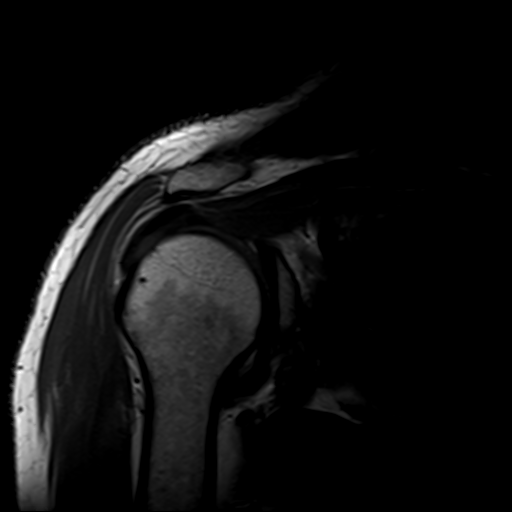
[im 16/24]
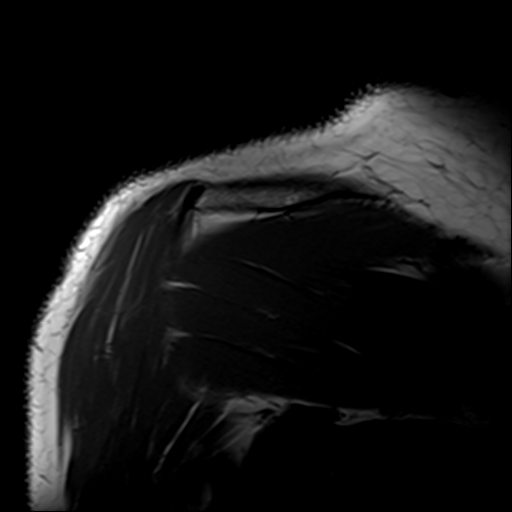
[im 20/24]
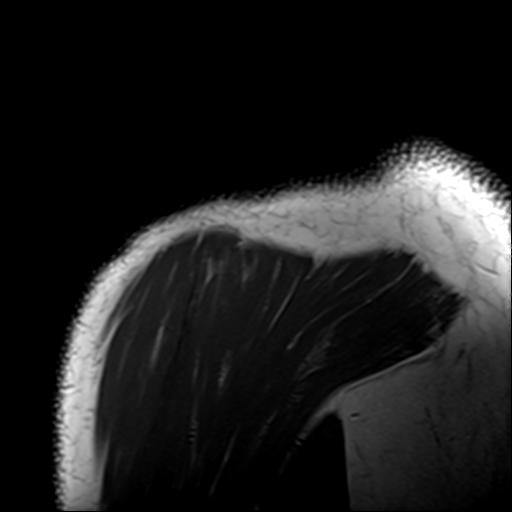
[im 24/24]
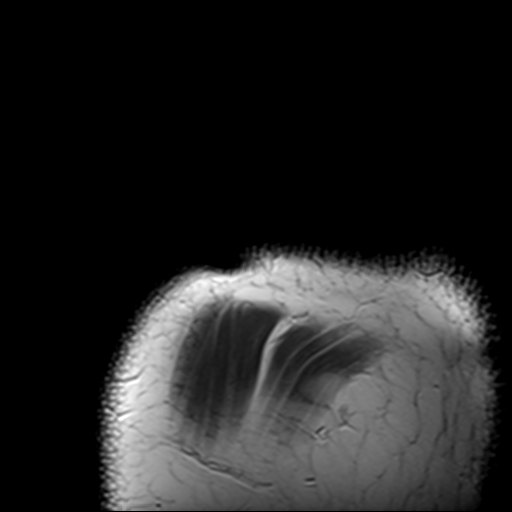

[Series 6: T2 fat-sat · oblique · 4.0mm · 0.62mm/px · 3 of 26 slices shown (3 of 3)]
[im 4/26]
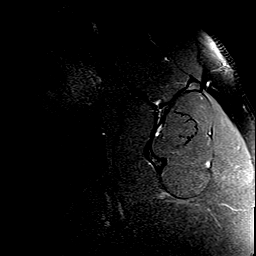
[im 15/26]
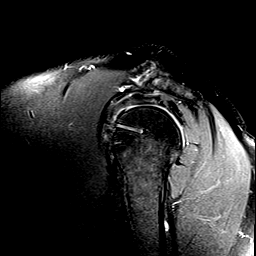
[im 22/26]
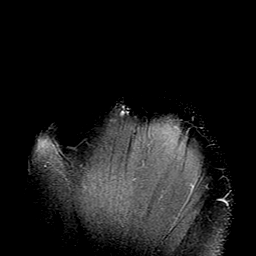

[25 of 40 positions shown; findings below may reference images not displayed]

FINDINGS: Rotator cuff: There is a new screw anchor within the anterior
greater tuberosity at the far anterior supraspinatus-subscapularis
tendon interface. There is mildly increased moderate mid and
anterior supraspinatus tendinosis. There is mild worsening of
moderate articular sided and midsubstance tears of the mid and
anterior supraspinatus tendon footprint in a region measuring up to
approximately 2 cm in AP dimension. The articular sided tears
involve up to approximately 40% of the craniocaudal tendon thickness
coronal greatest within the distal critical zone and proximal tendon
footprint of the supraspinatus (coronal images 13 and 14).
Mild-to-moderate anterior infraspinatus tendinosis is similar to
prior. The prior fluid bright extensive interstitial tearing within
the mid AP dimension of the subscapularis tendon is less well
visualized on the current study. There is a new screw anchor within
the superior lesser tuberosity likely status post interval
subscapularis repair. Tiny anterior superior subscapularis fluid
bright residual tear (axial series 2, image 13). The teres minor is
intact.

Muscles: No rotator cuff muscle atrophy, fatty infiltration, or
edema.

Biceps long head: The intra-articular long head of the biceps tendon
is not well visualized proximal to the bicipital groove. There may
be postsurgical changes of interval biceps tenotomy.

Acromioclavicular Joint: There are mild-to-moderate degenerative
changes of the acromioclavicular joint including joint space
narrowing, subchondral marrow edema, and peripheral osteophytosis.
Type II acromion. No subacromial/subdeltoid bursitis.

Glenohumeral Joint: Mild-to-moderate thinning of the glenoid and
humeral head cartilage.

Labrum: There are again degenerative changes at the peripheral
aspect of the superior glenoid labrum.

Bones:  No acute fracture.

Other: None.
IMPRESSION: Compared to [DATE]:

1. Screw anchor at the far anterior aspect of the greater tuberosity
and the superior aspect of the lesser tuberosity. There appear to be
postsurgical changes of interval rotator cuff repair and biceps
tenotomy.
2. Interval worsening in moderate articular sided and midsubstance
tears of the mid and anterior supraspinatus tendon footprint.
3. Interval repair of the prior extensive interstitial subscapularis
tendon tear. Minimal residual superior subscapularis interstitial
tear.
4. Mild-to-moderate degenerative changes of the acromioclavicular
joint.

## 2021-11-04 ENCOUNTER — Ambulatory Visit: Payer: Self-pay | Admitting: Physician Assistant

## 2021-11-09 ENCOUNTER — Ambulatory Visit: Payer: Self-pay | Attending: Nurse Practitioner | Admitting: Physical Therapy

## 2021-11-09 ENCOUNTER — Encounter: Payer: Self-pay | Admitting: Physical Therapy

## 2021-11-09 ENCOUNTER — Other Ambulatory Visit: Payer: Self-pay

## 2021-11-09 DIAGNOSIS — M25511 Pain in right shoulder: Secondary | ICD-10-CM | POA: Insufficient documentation

## 2021-11-09 DIAGNOSIS — G8929 Other chronic pain: Secondary | ICD-10-CM | POA: Insufficient documentation

## 2021-11-09 DIAGNOSIS — R600 Localized edema: Secondary | ICD-10-CM | POA: Insufficient documentation

## 2021-11-09 DIAGNOSIS — M6281 Muscle weakness (generalized): Secondary | ICD-10-CM | POA: Insufficient documentation

## 2021-11-09 NOTE — Therapy (Signed)
OUTPATIENT PHYSICAL THERAPY SHOULDER EVALUATION   Patient Name: Michael Fleming MRN: [id number] DOB:01/13/1971, 51 y.o., male Today's Date: 11/09/2021   PT End of Session - 11/09/21 0924     Visit Number 1    Number of Visits 7    Date for PT Re-Evaluation 12/21/21    Authorization Type self-pay    PT Start Time 0925    PT Stop Time 1015    PT Time Calculation (min) 50 min    Activity Tolerance Patient tolerated treatment well    Behavior During Therapy [facility] for tasks assessed/performed             Past Medical History:  Diagnosis Date   Allergy    Biceps tendon tear    right   Heart palpitations 12/2018   Hyperlipidemia    Hypertension    Hypokalemia 12/2018   Seizures ([place])    last seizure age 32 due to blood clot- 1981 none since    Past Surgical History:  Procedure Laterality Date   BRAIN SURGERY     1981 blot clot removed   FOOT SURGERY     right bunion removed   KNEE ARTHROSCOPY Left    SHOULDER ARTHROSCOPY WITH SUBACROMIAL DECOMPRESSION AND BICEP TENDON REPAIR Right 03/31/2021   Procedure: RIGHT SHOULDER ARTHROSCOPY, EXTENSIVE DEBRIDEMENT, SUBACROMIAL DECOMPRESSION, OPEN BICEPS TENODESIS;  Surgeon: Leandrew Koyanagi, MD;  Location: [place];  Service: Orthopedics;  Laterality: Right;   WRIST SURGERY Left    Patient Active Problem List   Diagnosis Date Noted   Tendinopathy of right biceps tendon    Degenerative 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 shoulder    Tendinosis of rotator cuff    Traumatic partial tear of right biceps tendon 03/04/2021   Partial tear of right subscapularis tendon 03/04/2021   Nontraumatic tear of supraspinatus tendon, right 03/04/2021   Hyperlipidemia 02/05/2019   Hypokalemia 02/05/2019   Vitamin D deficiency 02/05/2019   Tick bite of groin 11/27/2018   Back pain 08/06/2018   Blurry vision 08/06/2018   Pain in right wrist 03/27/2017   Chronic pain of left knee 03/27/2017   HTN  (hypertension) 10/23/2016   History of brain surgery 10/23/2016   Chronic headache 10/23/2016    PCP: Teena Dunk, NP   REFERRING PROVIDER: Bo Merino I, NP   REFERRING DIAG: Chronic right shoulder pain [M25.511, G89.29]  THERAPY DIAG:  Chronic right shoulder pain  Muscle weakness (generalized)  Rationale for Evaluation and Treatment Rehabilitation  ONSET DATE: 03/31/2021  SUBJECTIVE:  SUBJECTIVE STATEMENT: Pt reports having R shoulder that occurs with walking and letting the arm swing, and will get the occasional referred pain which he describes as nerve pain. He reports popping that occurs in the shoulder, he reports it feels tense and swollen. He got a cortisone shot and reported no benefit. He denies any injuries or occurrences that would have caused this issue.  PERTINENT HISTORY: Hx of seizures, hx or R shoulder surgery on 03/31/2021  PAIN:  Are you having pain? Yes: NPRS scale:  At worst Currently 0/10 Pain location: R shoulder Pain description: aching/ throbbing Aggravating factors: laying on that side, and pushing up with the RUE, and swinging the RUE Relieving factors: get off the shoulder and rest,   PRECAUTIONS: None  WEIGHT BEARING RESTRICTIONS No  FALLS:  Has patient fallen in last 6 months? No  LIVING ENVIRONMENT: Lives with: lives with their family Lives in: House/apartment Stairs: Yes: External: 2 steps; none Has following equipment at home: None  OCCUPATION: Valet - part time  PLOF: Independent with basic ADLs  PATIENT GOALS Decrease pain, increase strength  OBJECTIVE:   DIAGNOSTIC FINDINGS:  MRI on 10/29/21 with contrast (pending reading)  PATIENT SURVEYS:  FOTO 51% predicted 69%  COGNITION:  Overall cognitive status: Within functional limits for  tasks assessed     SENSATION: WFL  POSTURE: Forward head posture, anteriorly rolled shoulders   UPPER EXTREMITY ROM:   Active ROM Right eval Left eval  Shoulder flexion 150 156  Shoulder extension 65 58  Shoulder abduction 138 128  Shoulder adduction    Shoulder internal rotation T12 T8  Shoulder external rotation T4 T4  Elbow flexion    Elbow extension    Wrist flexion    Wrist extension    Wrist ulnar deviation    Wrist radial deviation    Wrist pronation    Wrist supination    (Blank rows = not tested)  UPPER EXTREMITY MMT:  MMT Right eval Left eval  Shoulder flexion 4 P! 4+  Shoulder extension 5 5  Shoulder abduction 3+ P! 5  Shoulder adduction    Shoulder internal rotation 4+ 4+  Shoulder external rotation 4+ 4  Middle trapezius    Lower trapezius    Elbow flexion 4+ 4+  Elbow extension    Wrist flexion    Wrist extension    Wrist ulnar deviation    Wrist radial deviation    Wrist pronation    Wrist supination    Grip strength (lbs)    (Blank rows = not tested)  SHOULDER SPECIAL TESTS:  Rotator cuff assessment: Drop arm test: negative and External rotation lag sign: negative  Biceps assessment: Speed's test: negative  JOINT MOBILITY TESTING:  Hypomobility with anterior translation with reproduction of symptoms  PALPATION:  TTP along the posterior aspect of the shoulder, proximal bicep, pec major/ minor and trigger points noted inalong the upper trap/ levator and bicep brachii.    TODAY'S TREATMENT:  [facility] Adult PT Treatment:                                                DATE: 11/09/2021  Therapeutic Exercise: Posterior capsule stretch 2 x 30 sec Manual Therapy: IASTM along bicep , deltoid, and pec major    Trigger Point Dry-Needling  Treatment instructions: Expect mild to moderate muscle soreness. S/S of pneumothorax  if dry needled over a lung field, and to seek immediate medical attention should they occur. Patient verbalized  understanding of these instructions and education.  Patient Consent Given: Yes Education handout provided: Yes Muscles treated: R bicep brachii, pec major, middle deltoid Electrical stimulation performed: No Parameters: N/A Treatment response/outcome: twitch response, muscle lengthening,     PATIENT EDUCATION: Education details: evaluation findings, POC, goals, HEP with proper form/ rationale. Person educated: Patient Education method: Explanation Education comprehension: verbalized understanding   HOME EXERCISE PROGRAM: Access Code: [email or web address] URL: [email or web address] Date: 11/09/2021 Prepared by: Lulu Riding  Exercises - Bicep Stretch at Table  - 1 x daily - 7 x weekly - 2 sets - 10 reps - Sleeper Stretch  - 1 x daily - 7 x weekly - 2 sets - 10 reps - 30 seconds hold - Standing Shoulder Posterior Capsule Stretch  - 1 x daily - 7 x weekly - 2 sets - 10 reps - 30 seconds hold - Corner Pec Major Stretch  - 7 x weekly - 2 sets - 3 reps - 30 seconds hold - Standing Shoulder Scaption with Resistance  - 1 x daily - 7 x weekly - 2 sets - 15 reps  ASSESSMENT:  CLINICAL IMPRESSION: Patient is a 51 y.o. M who was seen today for physical therapy evaluation and treatment for R shoulder pain that has been present since he has surgery on 03/31/2022. CC is pain noted with swinging the R arm with walking and pulling with resistance across his body. He has functional ROM with weakness noted most notably with abduction. TTP along the proximal bicep/ pec major and middle/ posterior deltoid, with muscle hypertrophy noted. Educated and consent was given for TPDN for the bicep brachii, pec major and middle deltoid followed with IASTM techniques and posterior capsular stretching. He would benefit from physical therapy to decrease R shoulder pain, improve strengthening, promote efficient posture and maximize his function by addressing the deficits listed.    OBJECTIVE IMPAIRMENTS  decreased activity tolerance, decreased strength, increased muscle spasms, impaired UE functional use, and pain.   ACTIVITY LIMITATIONS carrying, lifting, and reach over head  PARTICIPATION LIMITATIONS:  fishing/ work  PERSONAL FACTORS 1 comorbidity: hx of seizures and previous surgery  are also affecting patient's functional outcome.   REHAB POTENTIAL: Good  CLINICAL DECISION MAKING: Evolving/moderate complexity  EVALUATION COMPLEXITY: Moderate   GOALS: Goals reviewed with patient? Yes  SHORT TERM GOALS: Target date: 11/30/2021    Pt to be IND with initial HEP for therapeutic progression Baseline: Goal status: INITIAL  LONG TERM GOALS: Target date: 12/21/2021   Pt to increase R shoulder strength to >/= 4+/5 to promote lifting/ carrying activities as it relates to work.  Baseline:  Goal status: INITIAL  2.  Pt to be able to demo / verbalize efficient posture to reduce and prevent R shoulder pain Baseline:  Goal status: INITIAL  3.  Pt to be able to pull with resistance to a horizontally adducted position with no report of pain or limitation Baseline:  Goal status: INITIAL  4.  Increase FOTO score to >/= 69% to demo improvement in function Baseline:  Goal status: INITIAL  5.  Pt to be IND with all HEP to be able to maintain and progress current LOF IND Baseline:  Goal status: INITIAL   PLAN: PT FREQUENCY: 1-2x/week  PT DURATION: 6 weeks  PLANNED INTERVENTIONS: Therapeutic exercises, Therapeutic activity, Neuromuscular re-education, Balance training, Gait training, Patient/Family education, Joint manipulation, Joint mobilization, Dry Needling,  Cryotherapy, Moist heat, Taping, Ultrasound, Ionotophoresis 4mg /ml Dexamethasone, Manual therapy, and Re-evaluation  PLAN FOR NEXT SESSION: Review/ update HEP PRN. Response to TPDN, continue PRN for pec major, middle/ posterior deltoid, posture education keeping shoulders back. Scapular stabilizers, posterior capsule stretching.    Kynlea Blackston PT, DPT, LAT, ATC  11/09/21  11:09 AM

## 2021-11-12 ENCOUNTER — Ambulatory Visit (INDEPENDENT_AMBULATORY_CARE_PROVIDER_SITE_OTHER): Payer: Self-pay | Admitting: Orthopaedic Surgery

## 2021-11-12 ENCOUNTER — Encounter: Payer: Self-pay | Admitting: Orthopaedic Surgery

## 2021-11-12 DIAGNOSIS — M19011 Primary osteoarthritis, right shoulder: Secondary | ICD-10-CM | POA: Insufficient documentation

## 2021-11-16 ENCOUNTER — Encounter: Payer: Self-pay | Admitting: Physical Therapy

## 2021-11-16 ENCOUNTER — Other Ambulatory Visit: Payer: Self-pay

## 2021-11-16 ENCOUNTER — Ambulatory Visit: Payer: Self-pay | Admitting: Physical Therapy

## 2021-11-16 DIAGNOSIS — R600 Localized edema: Secondary | ICD-10-CM

## 2021-11-16 DIAGNOSIS — M6281 Muscle weakness (generalized): Secondary | ICD-10-CM

## 2021-11-16 DIAGNOSIS — G8929 Other chronic pain: Secondary | ICD-10-CM

## 2021-11-18 NOTE — Therapy (Signed)
OUTPATIENT PHYSICAL THERAPY TREATMENT NOTE   Patient Name: Michael Fleming MRN: [id number] DOB:07/21/1970, 51 y.o., male Today's Date: 11/22/2021  PCP: Kathrynn Speed, NP   REFERRING PROVIDER: Kathrynn Speed, NP   END OF SESSION:   PT End of Session - 11/22/21 0926     Visit Number 3    Number of Visits 7    Date for PT Re-Evaluation 12/21/21    Authorization Type self-pay    PT Start Time 0930    PT Stop Time 1015    PT Time Calculation (min) 45 min    Activity Tolerance Patient tolerated treatment well    Behavior During Therapy [facility] for tasks assessed/performed              Past Medical History:  Diagnosis Date   Allergy    Biceps tendon tear    right   Heart palpitations 12/2018   Hyperlipidemia    Hypertension    Hypokalemia 12/2018   Seizures ([facility])    last seizure age 49 due to blood clot- 1981 none since    Past Surgical History:  Procedure Laterality Date   BRAIN SURGERY     1981 blot clot removed   FOOT SURGERY     right bunion removed   KNEE ARTHROSCOPY Left    SHOULDER ARTHROSCOPY WITH SUBACROMIAL DECOMPRESSION AND BICEP TENDON REPAIR Right 03/31/2021   Procedure: RIGHT SHOULDER ARTHROSCOPY, EXTENSIVE DEBRIDEMENT, SUBACROMIAL DECOMPRESSION, OPEN BICEPS TENODESIS;  Surgeon: Tarry Kos, MD;  Location: [facility];  Service: Orthopedics;  Laterality: Right;   WRIST SURGERY Left    Patient Active Problem List   Diagnosis Date Noted   Arthritis of right acromioclavicular joint 11/12/2021   Tendinopathy of right biceps tendon    Degenerative 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 shoulder    Tendinosis of rotator cuff    Traumatic partial tear of right biceps tendon 03/04/2021   Partial tear of right subscapularis tendon 03/04/2021   Nontraumatic tear of supraspinatus tendon, right 03/04/2021   Hyperlipidemia 02/05/2019   Hypokalemia 02/05/2019   Vitamin D deficiency 02/05/2019   Tick bite of groin  11/27/2018   Back pain 08/06/2018   Blurry vision 08/06/2018   Pain in right wrist 03/27/2017   Chronic pain of left knee 03/27/2017   HTN (hypertension) 10/23/2016   History of brain surgery 10/23/2016   Chronic headache 10/23/2016    REFERRING DIAG: Chronic right shoulder pain [M25.511, G89.29]  THERAPY DIAG:  Chronic right shoulder pain  Muscle weakness (generalized)  Localized edema  Rationale for Evaluation and Treatment Rehabilitation  PERTINENT HISTORY: Hx of seizures, hx or R shoulder surgery on 03/31/2021  PRECAUTIONS: None  SUBJECTIVE: Pt reports the most prominent pain with sleeping and when he gets cold. He states that he is very cautious of his R UE and tries to not overextend himself.   PAIN:  Are you having pain? NPRS scale: 0/10 at rest. At worst Currently 0/10 Pain location: R shoulder Pain description: aching/ throbbing Aggravating factors: laying on that side, and pushing up with the RUE, and swinging the RUE Relieving factors: get off the shoulder and rest,    OBJECTIVE:    DIAGNOSTIC FINDINGS:  MRI on 10/29/21 with contrast (pending reading)   PATIENT SURVEYS:  FOTO 51% predicted 69%   COGNITION:           Overall cognitive status: Within functional limits for tasks assessed  SENSATION: WFL   POSTURE: Forward head posture, anteriorly rolled shoulders     UPPER EXTREMITY ROM:    Active ROM Right eval Left eval  Shoulder flexion 150 156  Shoulder extension 65 58  Shoulder abduction 138 128  Shoulder adduction      Shoulder internal rotation T12 T8  Shoulder external rotation T4 T4  Elbow flexion      Elbow extension      Wrist flexion      Wrist extension      Wrist ulnar deviation      Wrist radial deviation      Wrist pronation      Wrist supination      (Blank rows = not tested)   UPPER EXTREMITY MMT:   MMT Right eval Left eval  Shoulder flexion 4 P! 4+  Shoulder extension 5 5  Shoulder  abduction 3+ P! 5  Shoulder adduction      Shoulder internal rotation 4+ 4+  Shoulder external rotation 4+ 4  Middle trapezius      Lower trapezius      Elbow flexion 4+ 4+  Elbow extension      Wrist flexion      Wrist extension      Wrist ulnar deviation      Wrist radial deviation      Wrist pronation      Wrist supination      Grip strength (lbs)      (Blank rows = not tested)   SHOULDER SPECIAL TESTS:            Rotator cuff assessment: Drop arm test: negative and External rotation lag sign: negative            Biceps assessment: Speed's test: negative   JOINT MOBILITY TESTING:  Hypomobility with anterior translation with reproduction of symptoms   PALPATION:  TTP along the posterior aspect of the shoulder, proximal bicep, pec major/ minor and trigger points noted inalong the upper trap/ levator and bicep brachii.              TODAY'S TREATMENT:   [facility] Adult PT Treatment:                                                DATE: 11/22/2021 Therapeutic Exercise: UBE Lvl 2 resistance. Posterior capsule stretch 2 x 30 sec Sidelying ER with 2# 10 reps, 2 sets.  Sidelying flexion with 2# 10 reps, 2 sets.  Rows 1 x 20 with GTB Standing Y's with RTB 10 reps, 2 sets.  Shoulder Ext x20 with GTB  Horizontal adduction 95# 10 reps, 2 sets.  Lat pull down 1 x 15 45lbs.  Pec stretch 2 x 30 sec  Moist heat 5 min  [facility] Adult PT Treatment:                                                DATE: 11/16/2021 Therapeutic Exercise: UBE Posterior capsule stretch 2 x 30 sec Rows 1 x 20 with GTB Bilat ER 1 x 20 with GTB Shoulder Ext x20 with GTB Wall slides with lift off x 20 Pec stretch 2 x 30 sec  Lat pull down 1 x 15 25lbs.  [facility] Adult PT Treatment:                                                DATE: 11/16/2021 Therapeutic Exercise: UBE Posterior capsule stretch 2 x 30 sec Rows 1 x 20 with GTB Bilat ER 1 x 20 with GTB Shoulder Ext x20 with GTB Wall slides with  lift off x 20 Pec stretch 2 x 30 sec  Lat pull down 1 x 15 25lbs.    [facility] Adult PT Treatment:                                                DATE: 11/09/2021   Therapeutic Exercise: Posterior capsule stretch 2 x 30 sec Manual Therapy: IASTM along bicep , deltoid, and pec major                 Trigger Point Dry-Needling  Treatment instructions: Expect mild to moderate muscle soreness. S/S of pneumothorax if dry needled over a lung field, and to seek immediate medical attention should they occur. Patient verbalized understanding of these instructions and education.   Patient Consent Given: Yes Education handout provided: Yes Muscles treated: R bicep brachii, pec major, middle deltoid Electrical stimulation performed: No Parameters: N/A Treatment response/outcome: twitch response, muscle lengthening,      PATIENT EDUCATION: Education details: evaluation findings, POC, goals, HEP with proper form/ rationale. Person educated: Patient Education method: Explanation Education comprehension: verbalized understanding     HOME EXERCISE PROGRAM: Access Code: [email or web address] URL: [email or web address] Date: 11/09/2021 Prepared by: Lulu Riding   Exercises - Bicep Stretch at Table  - 1 x daily - 7 x weekly - 2 sets - 10 reps - Sleeper Stretch  - 1 x daily - 7 x weekly - 2 sets - 10 reps - 30 seconds hold - Standing Shoulder Posterior Capsule Stretch  - 1 x daily - 7 x weekly - 2 sets - 10 reps - 30 seconds hold - Corner Pec Major Stretch  - 7 x weekly - 2 sets - 3 reps - 30 seconds hold - Standing Shoulder Scaption with Resistance  - 1 x daily - 7 x weekly - 2 sets - 15 reps   ASSESSMENT:   CLINICAL IMPRESSION: Patient reports no increase in pain after last session or with current HEP. He states that he is now able to do more prior to the onset of pain, with most pain noted when laying on shoulder or when cold. He was able to progress with exercises targeting R RTC and  parascapular musculature with increased weight, no increase in pain and min fatigue. Pt performed horizontal adduction with 95# with no pain noted today, demonstrating progress towards his goals. Despite improvements made, pt will continue to benefit from skilled PT to address continued deficits. Ended session with moist heat per pt request with skin WFL.      OBJECTIVE IMPAIRMENTS decreased activity tolerance, decreased strength, increased muscle spasms, impaired UE functional use, and pain.    ACTIVITY LIMITATIONS carrying, lifting, and reach over head   PARTICIPATION LIMITATIONS:  fishing/ work   PERSONAL FACTORS 1 comorbidity: hx of seizures and previous surgery  are also affecting patient's functional outcome.    REHAB POTENTIAL:  Good   CLINICAL DECISION MAKING: Evolving/moderate complexity   EVALUATION COMPLEXITY: Moderate     GOALS: Goals reviewed with patient? Yes   SHORT TERM GOALS: Target date: 11/30/2021     Pt to be IND with initial HEP for therapeutic progression Baseline: Goal status: INITIAL   LONG TERM GOALS: Target date: 12/21/2021    Pt to increase R shoulder strength to >/= 4+/5 to promote lifting/ carrying activities as it relates to work.  Baseline:  Goal status: INITIAL   2.  Pt to be able to demo / verbalize efficient posture to reduce and prevent R shoulder pain Baseline:  Goal status: INITIAL   3.  Pt to be able to pull with resistance to a horizontally adducted position with no report of pain or limitation Baseline:  Goal status: Ongoing, pt able to perform horizontal adduction with 95# 11/22/2021.   4.  Increase FOTO score to >/= 69% to demo improvement in function Baseline:  Goal status: INITIAL   5.  Pt to be IND with all HEP to be able to maintain and progress current LOF IND Baseline:  Goal status: INITIAL     PLAN: PT FREQUENCY: 1-2x/week   PT DURATION: 6 weeks   PLANNED INTERVENTIONS: Therapeutic exercises, Therapeutic activity,  Neuromuscular re-education, Balance training, Gait training, Patient/Family education, Joint manipulation, Joint mobilization, Dry Needling, Cryotherapy, Moist heat, Taping, Ultrasound, Ionotophoresis 4mg /ml Dexamethasone, Manual therapy, and Re-evaluation   PLAN FOR NEXT SESSION: Review/ update HEP PRN. Response to TPDN, continue PRN for pec major, middle/ posterior deltoid, posture education keeping shoulders back. Scapular stabilizers, posterior capsule stretching. Assess response to progressions made.    , PT 11/22/2021, 10:13 AM

## 2021-11-22 ENCOUNTER — Encounter: Payer: Self-pay | Admitting: Physical Therapy

## 2021-11-22 ENCOUNTER — Ambulatory Visit: Payer: Self-pay | Attending: Nurse Practitioner | Admitting: Physical Therapy

## 2021-11-22 DIAGNOSIS — G8929 Other chronic pain: Secondary | ICD-10-CM | POA: Insufficient documentation

## 2021-11-22 DIAGNOSIS — M25511 Pain in right shoulder: Secondary | ICD-10-CM | POA: Insufficient documentation

## 2021-11-22 DIAGNOSIS — M6281 Muscle weakness (generalized): Secondary | ICD-10-CM | POA: Insufficient documentation

## 2021-11-22 DIAGNOSIS — R600 Localized edema: Secondary | ICD-10-CM | POA: Insufficient documentation

## 2021-11-26 NOTE — Progress Notes (Unsigned)
Subjective:    CC: R shoulder pain  HPI: Pt is a 51 y/o male c/o R shoulder pain. Pt has been seen previously by Dr. Roda Shutters who performed a R shoulder arthroscopy w/ extensive debridement, subacromial decompression, and open biceps tenodesis on 03/31/21. Pt completed 12 post-op PT visits and was d/c on 06/07/21. Notes indication pt was doing well post-op until around Jan 2023 when his pain returned. Dr. Roda Shutters performed a R subacromial steroid injection on 08/10/21, which provided no relief and pt re-started PT, completing 3 additional visits. Today, pt reports continued shoulder pain especially with pain with superior shoulder motion across of her shoulder motion.     Dx imaging: 10/29/21 R shoulder MRI  10/12/21 R shoulder XR  03/01/21 R shoulder MRI  02/10/21 R shoulder XR  Pertinent review of Systems: No fevers or chills  Relevant historical information: History of brain surgery.   Objective:    Vitals:   11/30/21 1252  BP: 134/80  Pulse: 74  SpO2: 97%   General: Well Developed, well nourished, and in no acute distress.   MSK: Right shoulder: Normal appearing Tender palpation AC joint.  Normal shoulder motion pain with abduction.  Lab and Radiology Results  Procedure: Real-time Ultrasound Guided Injection of right AC joint Device: Philips Affiniti 50G Images permanently stored and available for review in PACS Verbal informed consent obtained.  Discussed risks and benefits of procedure. Warned about infection, bleeding, hyperglycemia damage to structures among others. Patient expresses understanding and agreement Time-out conducted.   Noted no overlying erythema, induration, or other signs of local infection.   Skin prepped in a sterile fashion.   Local anesthesia: Topical Ethyl chloride.   With sterile technique and under real time ultrasound guidance: 40 mg of Kenalog and 1 mL of Marcaine injected into AC joint. Fluid seen entering the joint capsule.   Completed without  difficulty   Pain immediately resolved suggesting accurate placement of the medication.   Advised to call if fevers/chills, erythema, induration, drainage, or persistent bleeding.   Images permanently stored and available for review in the ultrasound unit.  Impression: Technically successful ultrasound guided injection.     EXAM: MRI OF THE RIGHT SHOULDER WITHOUT CONTRAST   TECHNIQUE: Multiplanar, multisequence MR imaging of the shoulder was performed. No intravenous contrast was administered.   COMPARISON:  Right shoulder radiographs 10/12/2021; MRI right shoulder 03/01/2021   FINDINGS: Rotator cuff: There is a new screw anchor within the anterior greater tuberosity at the far anterior supraspinatus-subscapularis tendon interface. There is mildly increased moderate mid and anterior supraspinatus tendinosis. There is mild worsening of moderate articular sided and midsubstance tears of the mid and anterior supraspinatus tendon footprint in a region measuring up to approximately 2 cm in AP dimension. The articular sided tears involve up to approximately 40% of the craniocaudal tendon thickness coronal greatest within the distal critical zone and proximal tendon footprint of the supraspinatus (coronal images 13 and 14). Mild-to-moderate anterior infraspinatus tendinosis is similar to prior. The prior fluid bright extensive interstitial tearing within the mid AP dimension of the subscapularis tendon is less well visualized on the current study. There is a new screw anchor within the superior lesser tuberosity likely status post interval subscapularis repair. Tiny anterior superior subscapularis fluid bright residual tear (axial series 2, image 13). The teres minor is intact.   Muscles: No rotator cuff muscle atrophy, fatty infiltration, or edema.   Biceps long head: The intra-articular long head of the biceps tendon is  not well visualized proximal to the bicipital groove. There  may be postsurgical changes of interval biceps tenotomy.   Acromioclavicular Joint: There are mild-to-moderate degenerative changes of the acromioclavicular joint including joint space narrowing, subchondral marrow edema, and peripheral osteophytosis. Type II acromion. No subacromial/subdeltoid bursitis.   Glenohumeral Joint: Mild-to-moderate thinning of the glenoid and humeral head cartilage.   Labrum: There are again degenerative changes at the peripheral aspect of the superior glenoid labrum.   Bones:  No acute fracture.   Other: None.   IMPRESSION: Compared to 03/01/2021:   1. Screw anchor at the far anterior aspect of the greater tuberosity and the superior aspect of the lesser tuberosity. There appear to be postsurgical changes of interval rotator cuff repair and biceps tenotomy. 2. Interval worsening in moderate articular sided and midsubstance tears of the mid and anterior supraspinatus tendon footprint. 3. Interval repair of the prior extensive interstitial subscapularis tendon tear. Minimal residual superior subscapularis interstitial tear. 4. Mild-to-moderate degenerative changes of the acromioclavicular joint.     Electronically Signed   By: Neita Garnet M.D.   On: 11/12/2021 10:12   I, Michael Fleming, personally (independently) visualized and performed the interpretation of the images attached in this note.     Impression and Recommendations:    Assessment and Plan: 51 y.o. male with continued right shoulder pain.  He has multiple different locations of pain in the shoulder that have been addressed previously by Dr. Roda Shutters.  We will attempt to treat pain today with an [facility] joint injection.  He had partial pain relief from the injection which indicates that his [facility] joint is a pain generator.  Recheck back with me or Dr. Roda Shutters as needed.   PDMP not reviewed this encounter. Orders Placed This Encounter  Procedures   [place] LIMITED JOINT SPACE STRUCTURES UP RIGHT(NO LINKED  CHARGES)    Standing Status:   Future    Number of Occurrences:   1    Standing Expiration Date:   06/02/2022    Order Specific Question:   Reason for Exam (SYMPTOM  OR DIAGNOSIS REQUIRED)    Answer:   right shoulder pain    Order Specific Question:   Preferred imaging location?    Answer:   [facility]   No orders of the defined types were placed in this encounter.   Discussed warning signs or symptoms. Please see discharge instructions. Patient expresses understanding.   The above documentation has been reviewed and is accurate and complete Michael Fleming, M.D.

## 2021-11-29 ENCOUNTER — Encounter: Payer: Self-pay | Admitting: Physical Therapy

## 2021-11-29 ENCOUNTER — Ambulatory Visit: Payer: Self-pay | Admitting: Physical Therapy

## 2021-11-29 DIAGNOSIS — R600 Localized edema: Secondary | ICD-10-CM

## 2021-11-29 DIAGNOSIS — M6281 Muscle weakness (generalized): Secondary | ICD-10-CM

## 2021-11-29 DIAGNOSIS — G8929 Other chronic pain: Secondary | ICD-10-CM

## 2021-11-29 NOTE — Therapy (Signed)
OUTPATIENT PHYSICAL THERAPY TREATMENT NOTE   Patient Name: Michael Fleming MRN: [id number] DOB:07-Jan-1971, 51 y.o., male Today's Date: 11/29/2021  PCP: Teena Dunk, NP   REFERRING PROVIDER: Teena Dunk, NP   END OF SESSION:   PT End of Session - 11/29/21 0937     Visit Number 4    Number of Visits 7    Date for PT Re-Evaluation 12/21/21    Authorization Type self-pay    PT Start Time 0933    PT Stop Time 1012    PT Time Calculation (min) 39 min              Past Medical History:  Diagnosis Date   Allergy    Biceps tendon tear    right   Heart palpitations 12/2018   Hyperlipidemia    Hypertension    Hypokalemia 12/2018   Seizures ([place])    last seizure age 29 due to blood clot- 1981 none since    Past Surgical History:  Procedure Laterality Date   BRAIN SURGERY     1981 blot clot removed   FOOT SURGERY     right bunion removed   KNEE ARTHROSCOPY Left    SHOULDER ARTHROSCOPY WITH SUBACROMIAL DECOMPRESSION AND BICEP TENDON REPAIR Right 03/31/2021   Procedure: RIGHT SHOULDER ARTHROSCOPY, EXTENSIVE DEBRIDEMENT, SUBACROMIAL DECOMPRESSION, OPEN BICEPS TENODESIS;  Surgeon: Leandrew Koyanagi, MD;  Location: [place];  Service: Orthopedics;  Laterality: Right;   WRIST SURGERY Left    Patient Active Problem List   Diagnosis Date Noted   Arthritis of right acromioclavicular joint 11/12/2021   Tendinopathy of right biceps tendon    Degenerative 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 shoulder    Tendinosis of rotator cuff    Traumatic partial tear of right biceps tendon 03/04/2021   Partial tear of right subscapularis tendon 03/04/2021   Nontraumatic tear of supraspinatus tendon, right 03/04/2021   Hyperlipidemia 02/05/2019   Hypokalemia 02/05/2019   Vitamin D deficiency 02/05/2019   Tick bite of groin 11/27/2018   Back pain 08/06/2018   Blurry vision 08/06/2018   Pain in right wrist 03/27/2017   Chronic pain of left  knee 03/27/2017   HTN (hypertension) 10/23/2016   History of brain surgery 10/23/2016   Chronic headache 10/23/2016    REFERRING DIAG: Chronic right shoulder pain [M25.511, G89.29]  THERAPY DIAG:  Chronic right shoulder pain  Localized edema  Muscle weakness (generalized)  Rationale for Evaluation and Treatment Rehabilitation  PERTINENT HISTORY: Hx of seizures, hx or R shoulder surgery on 03/31/2021  PRECAUTIONS: None  SUBJECTIVE: Pt reports sleep pain has decreased a lot. He will have a cortisone injection tomorrow. He reports pain with some reaching.  PAIN:  Are you having pain? NPRS scale: 0/10 at rest. At worst Currently 0/10 Pain location: R shoulder Pain description: aching/ throbbing Aggravating factors: laying on that side, and pushing up with the RUE, and swinging the RUE Relieving factors: get off the shoulder and rest,    OBJECTIVE:    DIAGNOSTIC FINDINGS:  MRI on 10/29/21 with contrast (pending reading)   PATIENT SURVEYS:  FOTO 51% predicted 69%   COGNITION:           Overall cognitive status: Within functional limits for tasks assessed                                  SENSATION: WFL   POSTURE:  Forward head posture, anteriorly rolled shoulders     UPPER EXTREMITY ROM:    Active ROM Right eval Left eval Right 11/29/21  Shoulder flexion 150 156 160  Shoulder extension 65 58   Shoulder abduction 138 128 145  Shoulder adduction       Shoulder internal rotation T12 T8 T12  Shoulder external rotation T4 T4   Elbow flexion       Elbow extension       Wrist flexion       Wrist extension       Wrist ulnar deviation       Wrist radial deviation       Wrist pronation       Wrist supination       (Blank rows = not tested)   UPPER EXTREMITY MMT:   MMT Right eval Left eval  Shoulder flexion 4 P! 4+  Shoulder extension 5 5  Shoulder abduction 3+ P! 5  Shoulder adduction      Shoulder internal rotation 4+ 4+  Shoulder external rotation 4+ 4   Middle trapezius      Lower trapezius      Elbow flexion 4+ 4+  Elbow extension      Wrist flexion      Wrist extension      Wrist ulnar deviation      Wrist radial deviation      Wrist pronation      Wrist supination      Grip strength (lbs)      (Blank rows = not tested)   SHOULDER SPECIAL TESTS:            Rotator cuff assessment: Drop arm test: negative and External rotation lag sign: negative            Biceps assessment: Speed's test: negative   JOINT MOBILITY TESTING:  Hypomobility with anterior translation with reproduction of symptoms   PALPATION:  TTP along the posterior aspect of the shoulder, proximal bicep, pec major/ minor and trigger points noted inalong the upper trap/ levator and bicep brachii.              TODAY'S TREATMENT:   [facility] Adult PT Treatment:                                                DATE: 11/29/2021 Therapeutic Exercise: UBE 7mn Lvl 2 resistance. Rows 1 x 20 with GTB Standing Y's with RTB 10 reps, 2 sets Shoulder Ext x20 with GTB  Standing Red band ER x 15 , IR x 15  Standing forward raise 2# 10 x 2 Standing lateral raise 2# 10 x 2  Posterior capsule stretch 2 x 30 sec Lat pull down 1 x 15 45lbs. .1 x 15 55lbs  Seated ROW 55# x 15, 65# x15 Horizontal adduction 95# 10 reps, 2 sets. (Pec fly) Bicep stretch in doorway Pec stretch 2 x 30 sec    [facility] Adult PT Treatment:                                                DATE: 11/22/2021 Therapeutic Exercise: UBE 637m Lvl 2 resistance. Posterior capsule stretch 2 x 30 sec Sidelying ER with 2# 10  reps, 2 sets.  Sidelying flexion with 2# 10 reps, 2 sets.  Rows 1 x 20 with GTB Standing Y's with RTB 10 reps, 2 sets.  Shoulder Ext x20 with GTB  Horizontal adduction 95# 10 reps, 2 sets.  Lat pull down 1 x 15 45lbs.  Pec stretch 2 x 30 sec  Moist heat 5 min  [facility] Adult PT Treatment:                                                DATE: 11/16/2021 Therapeutic Exercise: UBE 30mn Posterior  capsule stretch 2 x 30 sec Rows 1 x 20 with GTB Bilat ER 1 x 20 with GTB Shoulder Ext x20 with GTB Wall slides with lift off x 20 Pec stretch 2 x 30 sec  Lat pull down 1 x 15 25lbs.   [facility]                                                [facility]: [facility]16[facility]20[facility] Therapeutic Exercise: UBE 640m Posterior capsule stretch 2 x 30 sec Rows 1 x 20 with GTB Bilat ER 1 x 20 with GTB Shoulder Ext x20 with GTB Wall slides with lift off x 20 Pec stretch 2 x 30 sec  Lat pull down 1 x 15 25lbs.    [facility]                                                DATE: 11[facility]   Therapeutic Exercise: Posterior capsule stretch 2 x 30 sec Manual Therapy: IASTM along bicep , deltoid, and pec major                 Trigger Point Dry-Needling  Treatment instructions: Expect mild to moderate muscle soreness. S/S of pneumothorax if dry needled over a lung field, and to seek immediate medical attention should they occur. Patient verbalized understanding of these instructions and education.   Patient Consent Given: Yes Education handout provided: Yes Muscles treated: R bicep brachii, pec major, middle deltoid Electrical stimulation performed: No Parameters: N/A Treatment response/outcome: twitch response, muscle lengthening,      PATIENT EDUCATION: Education details: evaluation findings, POC, goals, HEP with proper form/ rationale. Person educated: Patient Education method: Explanation Education comprehension: verbalized understanding     HOME EXERCISE PROGRAM: Access Code: [email or web address] URL: [email or web address] Date: 11/09/2021 Prepared by: [place]arr [place] Exercises - Bicep Stretch at Table  - 1 x daily - 7 x weekly - 2 sets - 10 reps - Sleeper Stretch  - 1 x daily - 7 x weekly - 2 sets - 10 reps - 30 seconds hold - Standing Shoulder Posterior Capsule Stretch  - 1 x daily - 7 x weekly - 2 sets - 10 reps - 30 seconds hold - Corner Pec Major Stretch  -  7 x weekly - 2 sets - 3 reps - 30 seconds hold - Standing Shoulder Scaption with Resistance  - 1 x daily - 7 x weekly - 2 sets - 15 reps   ASSESSMENT:   CLINICAL IMPRESSION: Patient  reports improvement in night time pain with sleep. He does have pain if he lays on it and with most shoulder movements. His AROM has improved from [facility] reaching. He will receive an injection in shoulder/AC joint tomorrow.  He was able to progress with exercises targeting R RTC and parascapular musculature with increased weight, no increase in pain and min fatigue.  Despite improvements made, pt will continue to benefit from skilled PT to address continued deficits. Ended session with moist heat per pt request with skin WFL. He is independent with HEP , STG#1 met.      OBJECTIVE IMPAIRMENTS decreased activity tolerance, decreased strength, increased muscle spasms, impaired UE functional use, and pain.    ACTIVITY LIMITATIONS carrying, lifting, and reach over head   PARTICIPATION LIMITATIONS:  fishing/ work   PERSONAL FACTORS 1 comorbidity: hx of seizures and previous surgery  are also affecting patient's functional outcome.    REHAB POTENTIAL: Good   CLINICAL DECISION MAKING: Evolving/moderate complexity   EVALUATION COMPLEXITY: Moderate     GOALS: Goals reviewed with patient? Yes   SHORT TERM GOALS: Target date: 11/30/2021     Pt to be IND with initial HEP for therapeutic progression Baseline: Goal status: MET   LONG TERM GOALS: Target date: 12/21/2021    Pt to increase R shoulder strength to >/= 4+/5 to promote lifting/ carrying activities as it relates to work.  Baseline:  Goal status: INITIAL   2.  Pt to be able to demo / verbalize efficient posture to reduce and prevent R shoulder pain Baseline:  Goal status: INITIAL   3.  Pt to be able to pull with resistance to a horizontally adducted position with no report of pain or limitation Baseline:  Goal status: Ongoing, pt able to perform horizontal  adduction with 95# 11/22/2021.   4.  Increase FOTO score to >/= 69% to demo improvement in function Baseline:  Goal status: INITIAL   5.  Pt to be IND with all HEP to be able to maintain and progress current LOF IND Baseline:  Goal status: INITIAL     PLAN: PT FREQUENCY: 1-2x/week   PT DURATION: 6 weeks   PLANNED INTERVENTIONS: Therapeutic exercises, Therapeutic activity, Neuromuscular re-education, Balance training, Gait training, Patient/Family education, Joint manipulation, Joint mobilization, Dry Needling, Cryotherapy, Moist heat, Taping, Ultrasound, Ionotophoresis 6m/ml Dexamethasone, Manual therapy, and Re-evaluation   PLAN FOR NEXT SESSION: Review/ update HEP PRN. Response to TPDN, continue PRN for pec major, middle/ posterior deltoid, posture education keeping shoulders back. Scapular stabilizers, posterior capsule stretching. Assess response to progressions made.    JHessie Diener PTA 11/29/21 11:28 AM Phone: [phone]: [phone]

## 2021-11-30 ENCOUNTER — Encounter: Payer: Self-pay | Admitting: Family Medicine

## 2021-11-30 ENCOUNTER — Ambulatory Visit (INDEPENDENT_AMBULATORY_CARE_PROVIDER_SITE_OTHER): Payer: Self-pay | Admitting: Family Medicine

## 2021-11-30 ENCOUNTER — Ambulatory Visit: Payer: Self-pay

## 2021-11-30 VITALS — BP 134/80 | HR 74 | Ht 67.0 in | Wt 210.0 lb

## 2021-11-30 DIAGNOSIS — M25511 Pain in right shoulder: Secondary | ICD-10-CM

## 2021-11-30 NOTE — Patient Instructions (Signed)
Good to see you  Injection in right shoulder given today  Call or go to the ER if you develop a large red swollen joint with extreme pain or oozing puss.   Follow up as needed

## 2021-12-06 ENCOUNTER — Encounter: Payer: Self-pay | Admitting: Physical Therapy

## 2021-12-06 ENCOUNTER — Ambulatory Visit: Payer: Self-pay | Admitting: Physical Therapy

## 2021-12-06 DIAGNOSIS — G8929 Other chronic pain: Secondary | ICD-10-CM

## 2021-12-06 DIAGNOSIS — R600 Localized edema: Secondary | ICD-10-CM

## 2021-12-06 DIAGNOSIS — M6281 Muscle weakness (generalized): Secondary | ICD-10-CM

## 2021-12-06 NOTE — Therapy (Signed)
OUTPATIENT PHYSICAL THERAPY TREATMENT NOTE / DISCHARGE   Patient Name: Michael Fleming MRN: [id number] DOB:02/16/1971, 51 y.o., male Today's Date: 12/06/2021  PCP: Teena Dunk, NP   REFERRING PROVIDER: Teena Dunk, NP   END OF SESSION:   PT End of Session - 12/06/21 0936     Visit Number 5    Number of Visits 7    Date for PT Re-Evaluation 12/21/21    Authorization Type self-pay    PT Start Time 0933    PT Stop Time 1013    PT Time Calculation (min) 40 min    Activity Tolerance Patient tolerated treatment well    Behavior During Therapy [facility] for tasks assessed/performed               Past Medical History:  Diagnosis Date   Allergy    Biceps tendon tear    right   Heart palpitations 12/2018   Hyperlipidemia    Hypertension    Hypokalemia 12/2018   Seizures ([place])    last seizure age 68 due to blood clot- 1981 none since    Past Surgical History:  Procedure Laterality Date   BRAIN SURGERY     1981 blot clot removed   FOOT SURGERY     right bunion removed   KNEE ARTHROSCOPY Left    SHOULDER ARTHROSCOPY WITH SUBACROMIAL DECOMPRESSION AND BICEP TENDON REPAIR Right 03/31/2021   Procedure: RIGHT SHOULDER ARTHROSCOPY, EXTENSIVE DEBRIDEMENT, SUBACROMIAL DECOMPRESSION, OPEN BICEPS TENODESIS;  Surgeon: Leandrew Koyanagi, MD;  Location: [place];  Service: Orthopedics;  Laterality: Right;   WRIST SURGERY Left    Patient Active Problem List   Diagnosis Date Noted   Arthritis of right acromioclavicular joint 11/12/2021   Tendinopathy of right biceps tendon    Degenerative 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 shoulder    Tendinosis of rotator cuff    Traumatic partial tear of right biceps tendon 03/04/2021   Partial tear of right subscapularis tendon 03/04/2021   Nontraumatic tear of supraspinatus tendon, right 03/04/2021   Hyperlipidemia 02/05/2019   Hypokalemia 02/05/2019   Vitamin D deficiency 02/05/2019   Tick bite  of groin 11/27/2018   Back pain 08/06/2018   Blurry vision 08/06/2018   Pain in right wrist 03/27/2017   Chronic pain of left knee 03/27/2017   HTN (hypertension) 10/23/2016   History of brain surgery 10/23/2016   Chronic headache 10/23/2016    REFERRING DIAG: Chronic right shoulder pain [M25.511, G89.29]  THERAPY DIAG:  Chronic right shoulder pain  Localized edema  Muscle weakness (generalized)  Rationale for Evaluation and Treatment Rehabilitation  PERTINENT HISTORY: Hx of seizures, hx or R shoulder surgery on 03/31/2021  PRECAUTIONS: None  SUBJECTIVE: "I am doing pretty good since starting PT my pain is more dull. I haven't tried walking or running so I'm not sure how that impacts my shoulder"  PAIN:  Are you having pain? NPRS scale: 0/10 at rest. At worst Currently 0/10 Pain location: R shoulder Pain description: aching/ throbbing Aggravating factors: laying on that side, and pushing up with the RUE, and swinging the RUE Relieving factors: get off the shoulder and rest,    OBJECTIVE:    DIAGNOSTIC FINDINGS:  MRI on 10/29/21 with contrast (pending reading)   PATIENT SURVEYS:  FOTO 51% predicted 69% 12/06/21   COGNITION:           Overall cognitive status: Within functional limits for tasks assessed  SENSATION: WFL   POSTURE: Forward head posture, anteriorly rolled shoulders     UPPER EXTREMITY ROM:    Active ROM Right eval Left eval Right 11/29/21  Shoulder flexion 150 156 160  Shoulder extension 65 58   Shoulder abduction 138 128 145  Shoulder adduction       Shoulder internal rotation T12 T8 T12  Shoulder external rotation T4 T4   Elbow flexion       Elbow extension       Wrist flexion       Wrist extension       Wrist ulnar deviation       Wrist radial deviation       Wrist pronation       Wrist supination       (Blank rows = not tested)   UPPER EXTREMITY MMT:   MMT Right eval Left eval Right 12/06/2021   Shoulder flexion 4 P! 4+ 4+/5  Shoulder extension _0 Shoulder abduction 3+ P! 5 4/5  Shoulder adduction       Shoulder internal rotation 4+ 4+ 4+/5  Shoulder external rotation 4+ 4 4+/5  Middle trapezius       Lower trapezius       Elbow flexion 4+ 4+   Elbow extension       Wrist flexion       Wrist extension       Wrist ulnar deviation       Wrist radial deviation       Wrist pronation       Wrist supination       Grip strength (lbs)       (Blank rows = not tested)   SHOULDER SPECIAL TESTS:            Rotator cuff assessment: Drop arm test: negative and External rotation lag sign: negative            Biceps assessment: Speed's test: negative   JOINT MOBILITY TESTING:  Hypomobility with anterior translation with reproduction of symptoms   PALPATION:  TTP along the posterior aspect of the shoulder, proximal bicep, pec major/ minor and trigger points noted inalong the upper trap/ levator and bicep brachii.              TODAY'S TREATMENT:  [facility] Adult PT Treatment:                                                DATE: 12/06/2021 Therapeutic Exercise: Elliptical Ramp L1, resistance L2 x 2 min, L1 x 3 min  Door way pec stretch lower/ middle 2 x 30 sec ea 3-way bicep stretch 2x 30 sec Scaption with 5# bil 2 x 15 Horizontal abduction 2 x 15 with Black TB Diagonals 1 x 10 bil with black TB Lower trap strengthening with elbows on the wall 2 x 12 with Blue TB Wrist flexor/ extensor stretch 2 x 30 sec RUE only  Reviewed and updated HEP today with progression.  [facility] Adult PT Treatment:                                                DATE: 11/29/2021 Therapeutic Exercise: UBE 74mn Lvl 2 resistance. Rows 1  x 20 with GTB Standing Y's with RTB 10 reps, 2 sets Shoulder Ext x20 with GTB  Standing Red band ER x 15 , IR x 15  Standing forward raise 2# 10 x 2 Standing lateral raise 2# 10 x 2  Posterior capsule stretch 2 x 30 sec Lat pull down 1 x 15 45lbs. .1 x 15 55lbs  Seated  ROW 55# x 15, 65# x15 Horizontal adduction 95# 10 reps, 2 sets. (Pec fly) Bicep stretch in doorway Pec stretch 2 x 30 sec    [facility] Adult PT Treatment:                                                DATE: 11/22/2021 Therapeutic Exercise: UBE 42min Lvl 2 resistance. Posterior capsule stretch 2 x 30 sec Sidelying ER with 2# 10 reps, 2 sets.  Sidelying flexion with 2# 10 reps, 2 sets.  Rows 1 x 20 with GTB Standing Y's with RTB 10 reps, 2 sets.  Shoulder Ext x20 with GTB  Horizontal adduction 95# 10 reps, 2 sets.  Lat pull down 1 x 15 45lbs.  Pec stretch 2 x 30 sec  Moist heat 5 min       PATIENT EDUCATION: Education details: evaluation findings, POC, goals, HEP with proper form/ rationale. Person educated: Patient Education method: Explanation Education comprehension: verbalized understanding     HOME EXERCISE PROGRAM: Access Code: [email or web address] URL: [email or web address] Date: 12/06/2021 Prepared by: Starr Lake  Exercises - Bicep Stretch at Table  - 1 x daily - 7 x weekly - 2 sets - 10 reps - Sleeper Stretch  - 1 x daily - 7 x weekly - 2 sets - 10 reps - 30 seconds hold - Standing Shoulder Posterior Capsule Stretch  - 1 x daily - 7 x weekly - 2 sets - 10 reps - 30 seconds hold - Corner Pec Major Stretch  - 7 x weekly - 2 sets - 3 reps - 30 seconds hold - Standing Shoulder Scaption with Resistance  - 1 x daily - 7 x weekly - 2 sets - 15 reps - Seated Shoulder Horizontal Abduction with Resistance  - 1 x daily - 7 x weekly - 3 sets - 15 reps - Wrist Flexor Stretch with Elbow Flexed: Progression From Elbow at Side  - 2 x daily - 7 x weekly - 2 sets - 2 reps - 30 seconds hold - Wrist Extensor Stretch With Elbow Flexed: Progression From Elbow at Side  - 2 x daily - 7 x weekly - 2 sets - 2 reps - 30 hold   ASSESSMENT:   CLINICAL IMPRESSION: Michael Fleming has made great progress with physical therapy increasing RUE strength and additionally reports min to no pain. He did  well with all exercises today, with no report of pain but did note fatigue as a result of increased resistive load. He has met all goals and is able today and is able to maintain and progress his current LOF IND and will be discharged from PT today.      OBJECTIVE IMPAIRMENTS decreased activity tolerance, decreased strength, increased muscle spasms, impaired UE functional use, and pain.    ACTIVITY LIMITATIONS carrying, lifting, and reach over head   PARTICIPATION LIMITATIONS:  fishing/ work   PERSONAL FACTORS 1 comorbidity: hx of seizures and previous surgery  are also affecting patient's functional outcome.  REHAB POTENTIAL: Good   CLINICAL DECISION MAKING: Evolving/moderate complexity   EVALUATION COMPLEXITY: Moderate     GOALS: Goals reviewed with patient? Yes   SHORT TERM GOALS: Target date: 11/30/2021     Pt to be IND with initial HEP for therapeutic progression Baseline: Goal status: MET   LONG TERM GOALS: Target date: 12/21/2021    Pt to increase R shoulder strength to >/= 4+/5 to promote lifting/ carrying activities as it relates to work.  Baseline:  Goal status: MET 12/06/2021   2.  Pt to be able to demo / verbalize efficient posture to reduce and prevent R shoulder pain Baseline:  Goal status: MET 12/06/2021   3.  Pt to be able to pull with resistance to a horizontally adducted position with no report of pain or limitation Baseline:  Goal status: Ongoing, pt able to perform horizontal adduction with 95# 11/22/2021. MET 12/06/2021   4.  Increase FOTO score to >/= 69% to demo improvement in function Baseline:  Goal status: MET 12/06/2021   5.  Pt to be IND with all HEP to be able to maintain and progress current LOF IND Baseline:  Goal status: MET 12/06/2021     PLAN: PT FREQUENCY: 1-2x/week   PT DURATION: 6 weeks   PLANNED INTERVENTIONS: Therapeutic exercises, Therapeutic activity, Neuromuscular re-education, Balance training, Gait training, Patient/Family  education, Joint manipulation, Joint mobilization, Dry Needling, Cryotherapy, Moist heat, Taping, Ultrasound, Ionotophoresis 54m/ml Dexamethasone, Manual therapy, and Re-evaluation   PLAN FOR NEXT SESSION: Review/ update HEP PRN. Response to TPDN, continue PRN for pec major, middle/ posterior deltoid, posture education keeping shoulders back. Scapular stabilizers, posterior capsule stretching. Assess response to progressions made.    Kaylany Tesoriero PT, DPT, LAT, ATC  12/06/21  10:13 AM      PHYSICAL THERAPY DISCHARGE SUMMARY  Visits from Start of Care: 5  Current functional level related to goals / functional outcomes: See goals, FOTO 80%   Remaining deficits: See assessmnet/ flowsheet   Education / Equipment: HEP, theraband, posture, lifting mechanics   Patient agrees to discharge. Patient goals were met. Patient is being discharged due to meeting the stated rehab goals.   Elizardo Chilson PT, DPT, LAT, ATC  12/06/21  10:14 AM

## 2021-12-07 ENCOUNTER — Encounter: Payer: Self-pay | Admitting: Physical Therapy

## 2021-12-14 ENCOUNTER — Ambulatory Visit: Payer: Self-pay | Admitting: Physical Therapy

## 2021-12-16 ENCOUNTER — Other Ambulatory Visit: Payer: Self-pay | Admitting: Nurse Practitioner

## 2021-12-16 ENCOUNTER — Other Ambulatory Visit: Payer: Self-pay

## 2021-12-16 DIAGNOSIS — E7849 Other hyperlipidemia: Secondary | ICD-10-CM

## 2021-12-16 DIAGNOSIS — I10 Essential (primary) hypertension: Secondary | ICD-10-CM

## 2021-12-21 ENCOUNTER — Encounter: Payer: Self-pay | Admitting: Physical Therapy

## 2021-12-23 ENCOUNTER — Other Ambulatory Visit: Payer: Self-pay

## 2021-12-23 MED ORDER — LOSARTAN POTASSIUM 50 MG PO TABS
ORAL_TABLET | Freq: Every day | ORAL | 1 refills | Status: DC
  Filled 2021-12-23 – 2021-12-30 (×2): qty 30, 30d supply, fill #0
  Filled 2022-03-25: qty 30, 30d supply, fill #1

## 2021-12-23 MED ORDER — AMLODIPINE BESYLATE 10 MG PO TABS
ORAL_TABLET | Freq: Every day | ORAL | 1 refills | Status: DC
  Filled 2021-12-23 – 2021-12-30 (×2): qty 30, 30d supply, fill #0
  Filled 2022-03-25: qty 30, 30d supply, fill #1

## 2021-12-23 MED ORDER — ATORVASTATIN CALCIUM 40 MG PO TABS
40.0000 mg | ORAL_TABLET | Freq: Every day | ORAL | 2 refills | Status: DC
  Filled 2021-12-23 – 2021-12-30 (×2): qty 30, 30d supply, fill #0
  Filled 2022-03-25: qty 30, 30d supply, fill #1
  Filled 2022-05-19: qty 30, 30d supply, fill #2

## 2021-12-29 ENCOUNTER — Other Ambulatory Visit: Payer: Self-pay

## 2021-12-30 ENCOUNTER — Other Ambulatory Visit: Payer: Self-pay

## 2022-01-03 ENCOUNTER — Other Ambulatory Visit: Payer: Self-pay

## 2022-02-16 ENCOUNTER — Encounter: Payer: Self-pay | Admitting: Pain Medicine

## 2022-02-16 ENCOUNTER — Ambulatory Visit: Payer: Self-pay | Attending: Pain Medicine | Admitting: Pain Medicine

## 2022-02-16 VITALS — BP 137/87 | HR 86 | Temp 98.1°F | Resp 16 | Ht 67.0 in | Wt 213.0 lb

## 2022-02-16 DIAGNOSIS — M899 Disorder of bone, unspecified: Secondary | ICD-10-CM | POA: Insufficient documentation

## 2022-02-16 DIAGNOSIS — G894 Chronic pain syndrome: Secondary | ICD-10-CM | POA: Insufficient documentation

## 2022-02-16 DIAGNOSIS — Z79899 Other long term (current) drug therapy: Secondary | ICD-10-CM | POA: Insufficient documentation

## 2022-02-16 DIAGNOSIS — Z789 Other specified health status: Secondary | ICD-10-CM | POA: Insufficient documentation
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
## 2022-02-16 NOTE — Patient Instructions (Signed)
Return to review lab results.

## 2022-02-19 LAB — COMPLIANCE DRUG ANALYSIS, UR

## 2022-02-25 LAB — COMP. METABOLIC PANEL (12)
AST: 25 IU/L (ref 0–40)
Albumin/Globulin Ratio: 1.5 (ref 1.2–2.2)
Albumin: 4.5 g/dL (ref 3.8–4.9)
Alkaline Phosphatase: 97 IU/L (ref 44–121)
BUN/Creatinine Ratio: 8 — ABNORMAL LOW (ref 9–20)
BUN: 9 mg/dL (ref 6–24)
Bilirubin Total: 0.4 mg/dL (ref 0.0–1.2)
Calcium: 9.9 mg/dL (ref 8.7–10.2)
Chloride: 102 mmol/L (ref 96–106)
Creatinine, Ser: 1.09 mg/dL (ref 0.76–1.27)
Globulin, Total: 3 g/dL (ref 1.5–4.5)
Glucose: 99 mg/dL (ref 70–99)
Potassium: 4 mmol/L (ref 3.5–5.2)
Sodium: 139 mmol/L (ref 134–144)
Total Protein: 7.5 g/dL (ref 6.0–8.5)
eGFR: 81 mL/min/{1.73_m2} (ref >59)

## 2022-02-25 LAB — 25-HYDROXY VITAMIN D LCMS D2+D3
25-Hydroxy, Vitamin D-2: 2 ng/mL
25-Hydroxy, Vitamin D-3: 23 ng/mL
25-Hydroxy, Vitamin D: 25 ng/mL — ABNORMAL LOW

## 2022-02-25 LAB — MAGNESIUM: Magnesium: 1.9 mg/dL (ref 1.6–2.3)

## 2022-02-25 LAB — VITAMIN B12: Vitamin B-12: 916 pg/mL (ref 232–1245)

## 2022-02-25 LAB — SEDIMENTATION RATE: Sed Rate: 17 mm/hr (ref 0–30)

## 2022-02-25 LAB — C-REACTIVE PROTEIN: CRP: 5 mg/L (ref 0–10)

## 2022-03-12 ENCOUNTER — Encounter (HOSPITAL_COMMUNITY): Payer: Self-pay | Admitting: *Deleted

## 2022-03-12 ENCOUNTER — Other Ambulatory Visit: Payer: Self-pay

## 2022-03-12 ENCOUNTER — Ambulatory Visit (HOSPITAL_COMMUNITY)
Admission: EM | Admit: 2022-03-12 | Discharge: 2022-03-12 | Disposition: A | Discharge status: Home / Self Care | Payer: Self-pay | Attending: Emergency Medicine | Admitting: Emergency Medicine

## 2022-03-12 DIAGNOSIS — U071 COVID-19: Secondary | ICD-10-CM | POA: Insufficient documentation

## 2022-03-12 DIAGNOSIS — Z79899 Other long term (current) drug therapy: Secondary | ICD-10-CM | POA: Insufficient documentation

## 2022-03-12 DIAGNOSIS — B349 Viral infection, unspecified: Secondary | ICD-10-CM | POA: Insufficient documentation

## 2022-03-12 LAB — RESP PANEL BY RT-PCR (FLU A&B, COVID) ARPGX2
Influenza A by PCR: NEGATIVE
Influenza B by PCR: NEGATIVE
SARS Coronavirus 2 by RT PCR: POSITIVE — AB

## 2022-03-12 MED ORDER — ACETAMINOPHEN 325 MG PO TABS
ORAL_TABLET | ORAL | Status: AC
  Filled 2022-03-12: qty 3

## 2022-03-12 MED ORDER — PROMETHAZINE-DM 6.25-15 MG/5ML PO SYRP: 5.0000 mL | ORAL_SOLUTION | Freq: Every evening | ORAL | 0 refills | Status: DC

## 2022-03-12 MED ORDER — BENZONATATE 100 MG PO CAPS: 100.0000 mg | ORAL_CAPSULE | Freq: Three times a day (TID) | ORAL | 0 refills | Status: DC

## 2022-03-12 MED ORDER — ACETAMINOPHEN 325 MG PO TABS
975.0000 mg | ORAL_TABLET | Freq: Once | ORAL | Status: AC
  Administered 2022-03-12: 975 mg via ORAL

## 2022-03-12 NOTE — Discharge Instructions (Addendum)
I have sent Tessalon to the pharmacy, can take this every 8 hours as needed for cough.   I have sent Promethazine DM to the pharmacy, you can take 1 cup at night before bedtime help with cough.  Please do not operate any heavy machinery or drive a car after taking medication because it can make you sleepy.   COVID and flu test is pending we will call you if the test results are positive.You can  review these results on MyChart.   As discussed, you had a fever in office today, I think would be great if you could get a thermometer at home to check your temperature.  Please take Tylenol every 6 hours as needed for fever, not take more than 3000 mg in a 24-hour period.  You were given 975 mg of Tylenol at 6:26 PM today.

## 2022-03-12 NOTE — ED Triage Notes (Signed)
Pt reports cough and sore throat for 3 days.Pt wants a COVID test.
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
## 2022-03-13 ENCOUNTER — Telehealth (HOSPITAL_COMMUNITY): Payer: Self-pay | Admitting: Emergency Medicine

## 2022-03-13 NOTE — Telephone Encounter (Signed)
This writer attempted to call patient (1st attempt) to make him aware of his COVID test results.  No answer, phone went to voicemail.

## 2022-03-13 NOTE — Telephone Encounter (Signed)
Patient called clinic, patient identity was verified by this Probation officer.  Patient was made aware of COVID test results.  Patient was made aware of quarantine protocol.  Patient was given a note upon discharge that covers him for work.  Patient was made aware of symptom management of viral illness.  Patient stated he was overall doing well except for some fatigue.  Patient was advised to make sure that he is eating adequately with nutrient dense meals and maintaining hydration. Patient was made aware of red flag symptoms that would warrant an emergency department visit.  Patient verbalized understanding of instructions.

## 2022-03-25 ENCOUNTER — Other Ambulatory Visit: Payer: Self-pay

## 2022-03-29 ENCOUNTER — Other Ambulatory Visit: Payer: Self-pay

## 2022-04-08 ENCOUNTER — Other Ambulatory Visit: Payer: Self-pay

## 2022-04-08 ENCOUNTER — Ambulatory Visit (INDEPENDENT_AMBULATORY_CARE_PROVIDER_SITE_OTHER): Payer: Self-pay | Admitting: Nurse Practitioner

## 2022-04-08 ENCOUNTER — Encounter: Payer: Self-pay | Admitting: Nurse Practitioner

## 2022-04-08 VITALS — BP 132/84 | HR 84 | Temp 98.6°F | Ht 67.0 in | Wt 213.0 lb

## 2022-04-08 DIAGNOSIS — Z23 Encounter for immunization: Secondary | ICD-10-CM | POA: Insufficient documentation

## 2022-04-08 DIAGNOSIS — M545 Low back pain, unspecified: Secondary | ICD-10-CM

## 2022-04-08 DIAGNOSIS — I10 Essential (primary) hypertension: Secondary | ICD-10-CM

## 2022-04-08 DIAGNOSIS — E785 Hyperlipidemia, unspecified: Secondary | ICD-10-CM

## 2022-04-08 DIAGNOSIS — N529 Male erectile dysfunction, unspecified: Secondary | ICD-10-CM

## 2022-04-08 LAB — POCT URINALYSIS DIPSTICK
Bilirubin, UA: NEGATIVE
Blood, UA: NEGATIVE
Glucose, UA: NEGATIVE
Ketones, UA: NEGATIVE
Leukocytes, UA: NEGATIVE
Nitrite, UA: NEGATIVE
Protein, UA: NEGATIVE
Spec Grav, UA: <=1.005 — AB (ref 1.010–1.025)
Urobilinogen, UA: 0.2 E.U./dL
pH, UA: 6.5 (ref 5.0–8.0)

## 2022-04-08 MED ORDER — SILDENAFIL CITRATE 25 MG PO TABS
25.0000 mg | ORAL_TABLET | Freq: Every day | ORAL | 0 refills | Status: DC | PRN
  Filled 2022-04-08: qty 10, 30d supply, fill #0

## 2022-04-08 NOTE — Assessment & Plan Note (Signed)
-   Flu Vaccine QUAD 1mo+IM (Fluarix, Fluzone & Alfiuria Quad PF)   2. Erectile dysfunction, unspecified erectile dysfunction type  - sildenafil (VIAGRA) 25 MG tablet; Take 1 tablet (25 mg total) by mouth daily as needed for erectile dysfunction.  Dispense: 10 tablet; Refill: 0   3. Essential hypertension  - CBC - Comprehensive metabolic panel   4. Hyperlipidemia, unspecified hyperlipidemia type  - CBC - Comprehensive metabolic panel    Follow up:  Follow up in 3 months

## 2022-04-08 NOTE — Progress Notes (Signed)
@Patient  ID: , male    DOB: [birth year], 51 y.o.   MRN: [id number]  Chief Complaint  Patient presents with   Follow-up    Pt stated--low back pain.   Hypertension    Referring provider: [id number] I, NP   HPI  Michael Fleming 51 y.o. male  has a past medical history of Allergy, Biceps tendon tear, Heart palpitations (12/2018), Hyperlipidemia, Hypertension, Hypokalemia (12/2018), and Seizures (HCC).   Patient presents today for follow-up visit for chronic conditions.  Overall he has been doing well.  He does need medication refills.  He does state that in the past few days he has been having low back pain which she thinks associated with drinking too many sodas and not enough water.  We will check a UA in office today. Denies f/c/s, n/v/d, hemoptysis, PND, leg swelling Denies chest pain or edema       No Known Allergies  Immunization History  Administered Date(s) Administered   Influenza,inj,Quad PF,6+ Mos 01/20/2017, 04/08/2022   PFIZER(Purple Top)SARS-COV-2 Vaccination 10/29/2019, 01/07/2020   Tdap 09/29/2016    Past Medical History:  Diagnosis Date   Allergy    Biceps tendon tear    right   Heart palpitations 12/2018   Hyperlipidemia    Hypertension    Hypokalemia 12/2018   Seizures (HCC)    last seizure age 72 due to blood clot- 1981 none since     Tobacco History: Social History   Tobacco Use  Smoking Status Never  Smokeless Tobacco Never   Counseling given: Not Answered   Outpatient Encounter Medications as of 04/08/2022  Medication Sig   amLODipine (NORVASC) 10 MG tablet TAKE 1 TABLET (10 MG TOTAL) BY MOUTH DAILY.   atorvastatin (LIPITOR) 40 MG tablet Take 1 tablet (40 mg total) by mouth daily.   losartan (COZAAR) 50 MG tablet TAKE 1 TABLET (50 MG TOTAL) BY MOUTH DAILY.   sildenafil (VIAGRA) 25 MG tablet Take 1 tablet (25 mg total) by mouth daily as needed for erectile dysfunction.   [DISCONTINUED] benzonatate (TESSALON) 100  MG capsule Take 1 capsule (100 mg total) by mouth every 8 (eight) hours.   [DISCONTINUED] promethazine-dextromethorphan (PROMETHAZINE-DM) 6.25-15 MG/5ML syrup Take 5 mLs by mouth at bedtime.   No facility-administered encounter medications on file as of 04/08/2022.     Review of Systems  Review of Systems  Constitutional: Negative.   HENT: Negative.    Cardiovascular: Negative.   Gastrointestinal: Negative.   Allergic/Immunologic: Negative.   Neurological: Negative.   Psychiatric/Behavioral: Negative.         Physical Exam  BP 132/84   Pulse 84   Temp 98.6 F (37 C)   Ht 5\' 7"  (1.702 m)   Wt 213 lb (96.6 kg)   SpO2 98%   BMI 33.36 kg/m   Wt Readings from Last 5 Encounters:  04/08/22 213 lb (96.6 kg)  02/16/22 213 lb (96.6 kg)  11/30/21 210 lb (95.3 kg)  10/06/21 210 lb 9.6 oz (95.5 kg)  08/06/21 215 lb 6 oz (97.7 kg)     Physical Exam Vitals and nursing note reviewed.  Constitutional:      General: He is not in acute distress.    Appearance: He is well-developed.  Cardiovascular:     Rate and Rhythm: Normal rate and regular rhythm.  Pulmonary:     Effort: Pulmonary effort is normal.     Breath sounds: Normal breath sounds.  Skin:    General: Skin is warm  and dry.  Neurological:     Mental Status: He is alert and oriented to person, place, and time.        Assessment & Plan:   Need for immunization against influenza - Flu Vaccine QUAD 74mo+IM (Fluarix, Fluzone & Alfiuria Quad PF)   2. Erectile dysfunction, unspecified erectile dysfunction type  - sildenafil (VIAGRA) 25 MG tablet; Take 1 tablet (25 mg total) by mouth daily as needed for erectile dysfunction.  Dispense: 10 tablet; Refill: 0   3. Essential hypertension  - CBC - Comprehensive metabolic panel   4. Hyperlipidemia, unspecified hyperlipidemia type  - CBC - Comprehensive metabolic panel    Follow up:  Follow up in 3 months     Ivonne Andrew, NP 04/08/2022

## 2022-04-08 NOTE — Patient Instructions (Addendum)
1. Need for immunization against influenza  - Flu Vaccine QUAD 22mo+IM (Fluarix, Fluzone & Alfiuria Quad PF)   2. Erectile dysfunction, unspecified erectile dysfunction type  - sildenafil (VIAGRA) 25 MG tablet; Take 1 tablet (25 mg total) by mouth daily as needed for erectile dysfunction.  Dispense: 10 tablet; Refill: 0   3. Essential hypertension  - CBC - Comprehensive metabolic panel   4. Hyperlipidemia, unspecified hyperlipidemia type  - CBC - Comprehensive metabolic panel    Follow up:  Follow up in 3 months

## 2022-04-09 LAB — COMPREHENSIVE METABOLIC PANEL
ALT: 28 IU/L (ref 0–44)
AST: 29 IU/L (ref 0–40)
Albumin/Globulin Ratio: 1.6 (ref 1.2–2.2)
Albumin: 4.7 g/dL (ref 3.8–4.9)
Alkaline Phosphatase: 92 IU/L (ref 44–121)
BUN/Creatinine Ratio: 9 (ref 9–20)
BUN: 10 mg/dL (ref 6–24)
Bilirubin Total: 0.4 mg/dL (ref 0.0–1.2)
CO2: 26 mmol/L (ref 20–29)
Calcium: 9.9 mg/dL (ref 8.7–10.2)
Chloride: 101 mmol/L (ref 96–106)
Creatinine, Ser: 1.17 mg/dL (ref 0.76–1.27)
Globulin, Total: 2.9 g/dL (ref 1.5–4.5)
Glucose: 105 mg/dL — ABNORMAL HIGH (ref 70–99)
Potassium: 4 mmol/L (ref 3.5–5.2)
Sodium: 138 mmol/L (ref 134–144)
Total Protein: 7.6 g/dL (ref 6.0–8.5)
eGFR: 75 mL/min/{1.73_m2} (ref >59)

## 2022-04-09 LAB — CBC
Hematocrit: 40.3 % (ref 37.5–51.0)
Hemoglobin: 13.6 g/dL (ref 13.0–17.7)
MCH: 29 pg (ref 26.6–33.0)
MCHC: 33.7 g/dL (ref 31.5–35.7)
MCV: 86 fL (ref 79–97)
Platelets: 275 10*3/uL (ref 150–450)
RBC: 4.69 x10E6/uL (ref 4.14–5.80)
RDW: 12.7 % (ref 11.6–15.4)
WBC: 5.3 10*3/uL (ref 3.4–10.8)

## 2022-04-10 NOTE — Progress Notes (Addendum)
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 loss of ROM, or weakness Neurological: No reported episodes of acute onset apraxia, aphasia, dysarthria, agnosia, amnesia, paralysis, loss of coordination, or loss of consciousness  Allergies  Michael Fleming has No Known Allergies.  PFSH  Drug: Michael Fleming  reports no history of drug use. Alcohol:  reports current alcohol use. Tobacco:  reports that he has never smoked. He has never used smokeless tobacco. Medical:  has a past medical history of Allergy, Biceps tendon tear, Heart palpitations (12/2018), Hyperlipidemia, Hypertension, Hypokalemia (12/2018), and Seizures (HCC). Surgical: Michael Fleming  has a past surgical history that includes Brain surgery; Foot surgery; Knee arthroscopy (Left); Wrist surgery (Left); and Shoulder arthroscopy with subacromial  decompression and bicep tendon repair (Right, 03/31/2021). Family: family history includes Healthy in his mother; Hypertension in his father.  Constitutional Exam  General appearance: Well nourished, well developed, and well hydrated. In no apparent acute distress Vitals:   04/11/22 1012  BP: (!) 148/105  Pulse: 77  Temp: 98.2 F (36.8 C)  TempSrc: Temporal  SpO2: 98%  Weight: 213 lb (96.6 kg)  Height: 5\' 7"  (1.702 m)   BMI Assessment: Estimated body mass index is 33.36 kg/m as calculated from the following:   Height as of this encounter: 5\' 7"  (1.702 m).   Weight as of this encounter: 213 lb (96.6 kg).  BMI interpretation table: BMI level Category Range association with higher incidence of chronic pain  <18 kg/m2 Underweight   18.5-24.9 kg/m2 Ideal body weight   25-29.9 kg/m2 Overweight Increased incidence by 20%  30-34.9 kg/m2 Obese (Class I) Increased incidence by 68%  35-39.9 kg/m2 Severe obesity (Class II) Increased incidence by 136%  >40 kg/m2 Extreme obesity (Class III) Increased incidence by  254%   Patient's current BMI Ideal Body weight  Body mass index is 33.36 kg/m. Ideal body weight: 66.1 kg (145 lb 11.6 oz) Adjusted ideal body weight: 78.3 kg (172 lb 10.1 oz)   BMI Readings from Last 4 Encounters:  07/30/22 34.14 kg/m  04/11/22 33.36 kg/m  04/08/22 33.36 kg/m  02/16/22 33.36 kg/m   Wt Readings from Last 4 Encounters:  07/30/22 218 lb (98.9 kg)  04/11/22 213 lb (96.6 kg)  04/08/22 213 lb (96.6 kg)  02/16/22 213 lb (96.6 kg)    Psych/Mental status: Alert, oriented x 3 (person, place, & time)       Eyes: PERLA Respiratory: No evidence of acute respiratory distress  Assessment & Plan  Primary Diagnosis & Pertinent Problem List: The primary encounter diagnosis was Chronic pain syndrome. Diagnoses of Chronic shoulder pain (1ry area of Pain) (Right), Degenerative superior 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) tear of shoulder (Right), Nontraumatic tear of  supraspinatus tendon (Right), Partial tear of subscapularis tendon, sequela (Right), Tendinopathy of biceps tendon (Right), Tendinosis of rotator cuff (Right), Traumatic partial tear of biceps tendon, sequela (Right), Traumatic rupture of biceps tendon, sequela (Right), Vitamin D insufficiency, and Erectile dysfunction, unspecified erectile dysfunction type were also pertinent to this visit.  Visit Diagnosis: 1. Chronic pain syndrome   2. Chronic shoulder pain (1ry area of Pain) (Right)   3. Degenerative superior 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) tear of shoulder (Right)   4. Nontraumatic tear of supraspinatus tendon (Right)   5. Partial tear of subscapularis tendon, sequela (Right)   6. Tendinopathy of biceps tendon (Right)   7. Tendinosis of rotator cuff (Right)   8. Traumatic partial tear of biceps tendon, sequela (Right)   9. Traumatic rupture of biceps tendon, sequela (Right)   10. Vitamin D insufficiency   11. Erectile dysfunction, unspecified erectile dysfunction type    Problems updated and reviewed during this visit: No problems updated.   Plan of Care  Pharmacotherapy (Medications Ordered): Meds ordered this encounter  Medications   ergocalciferol (VITAMIN D2) 1.25 MG (50000 UT) capsule    Sig: Take 1 capsule (50,000 Units total) by mouth 2 (two) times a week. X 6 weeks.    Dispense:  12 capsule    Refill:  0    Fill one day early if pharmacy is closed on scheduled refill date. May substitute for generic, or similar, if available.   Cholecalciferol (VITAMIN D3) 125 MCG (5000 UT) CAPS    Sig: Take 1 capsule (5,000 Units total) by mouth daily with breakfast. Take along with calcium and magnesium.    Dispense:  30 capsule    Refill:  2    Fill 1 day early if pharmacy is closed on scheduled refill date. Generic permitted. Do not send renewal requests.   Procedure Orders    No procedure(s) ordered today    Lab Orders  No laboratory test(s) ordered today   Imaging  Orders  No imaging studies ordered today   Referral Orders  No referral(s) requested today    Pharmacological management:  Opioid Analgesics: I will not be prescribing any opioids at this time Membrane stabilizer: I will not be prescribing any at this time Muscle relaxant: I will not be prescribing any at this time NSAID: I will not be prescribing any at this time Other analgesic(s): I will not be prescribing any at this time      Interventional Therapies  Risk  Complexity Considerations:   Estimated body mass index is 33.36 kg/m as calculated from  the following:   Height as of this encounter: 5\' 7"  (1.702 m).   Weight as of this encounter: 213 lb (96.6 kg). WNL   Planned  Pending:   Diagnostic/therapeutic right suprascapular NB #1    Under consideration:   Diagnostic/therapeutic right suprascapular NB #1  Possible therapeutic right suprascapular nerve RFA  Diagnostic/therapeutic right IA shoulder joint inj. #1    Completed:   None at this time   Completed by other providers:   Therapeutic right shoulder arthroscopy, extensive debridement, subacromial decompression, open biceps tenodesis (03/31/2021) by Dr. Gershon Fleming [facility])  Diagnostic/therapeutic right subacromial bursa inj. x1 (08/10/2021) by Dr. Gershon Fleming [facility])    Therapeutic  Palliative (PRN) options:   None established     Provider-requested follow-up: Return for [facility]): (R) suprascapular NB. Recent Visits No visits were found meeting these conditions. Showing recent visits within past 90 days and meeting all other requirements Today's Visits Date Type Provider Dept  09/28/22 Office Visit Delano Metz, MD [facility]  Showing today's visits and meeting all other requirements Future Appointments No visits were found meeting these conditions. Showing future appointments within next 90 days and meeting all other requirements  Primary Care Physician: Kathrynn Speed, NP  (Inactive) Note by: Oswaldo Done, MD Date: 04/11/2022; Time: 11:30 AM

## 2022-04-11 ENCOUNTER — Ambulatory Visit: Payer: Self-pay | Attending: Pain Medicine | Admitting: Pain Medicine

## 2022-04-11 ENCOUNTER — Encounter: Payer: Self-pay | Admitting: Pain Medicine

## 2022-04-11 ENCOUNTER — Other Ambulatory Visit: Payer: Self-pay

## 2022-04-11 VITALS — BP 148/105 | HR 77 | Temp 98.2°F | Ht 67.0 in | Wt 213.0 lb

## 2022-04-11 DIAGNOSIS — M25511 Pain in right shoulder: Secondary | ICD-10-CM | POA: Insufficient documentation

## 2022-04-11 DIAGNOSIS — S46211D Strain of muscle, fascia and tendon of other parts of biceps, right arm, subsequent encounter: Secondary | ICD-10-CM

## 2022-04-11 DIAGNOSIS — G894 Chronic pain syndrome: Secondary | ICD-10-CM

## 2022-04-11 DIAGNOSIS — M67921 Unspecified disorder of synovium and tendon, right upper arm: Secondary | ICD-10-CM

## 2022-04-11 DIAGNOSIS — E559 Vitamin D deficiency, unspecified: Secondary | ICD-10-CM

## 2022-04-11 DIAGNOSIS — M67813 Other specified disorders of tendon, right shoulder: Secondary | ICD-10-CM

## 2022-04-11 DIAGNOSIS — S43431A Superior glenoid labrum lesion of right shoulder, initial encounter: Secondary | ICD-10-CM

## 2022-04-11 DIAGNOSIS — G8929 Other chronic pain: Secondary | ICD-10-CM | POA: Insufficient documentation

## 2022-04-11 DIAGNOSIS — S46811S Strain of other muscles, fascia and tendons at shoulder and upper arm level, right arm, sequela: Secondary | ICD-10-CM | POA: Insufficient documentation

## 2022-04-11 DIAGNOSIS — S46811D Strain of other muscles, fascia and tendons at shoulder and upper arm level, right arm, subsequent encounter: Secondary | ICD-10-CM

## 2022-04-11 DIAGNOSIS — S46211S Strain of muscle, fascia and tendon of other parts of biceps, right arm, sequela: Secondary | ICD-10-CM | POA: Insufficient documentation

## 2022-04-11 DIAGNOSIS — M75101 Unspecified rotator cuff tear or rupture of right shoulder, not specified as traumatic: Secondary | ICD-10-CM

## 2022-04-11 DIAGNOSIS — N529 Male erectile dysfunction, unspecified: Secondary | ICD-10-CM | POA: Insufficient documentation

## 2022-04-11 MED ORDER — VITAMIN D3 125 MCG (5000 UT) PO CAPS: 1.0000 | ORAL_CAPSULE | Freq: Every day | ORAL | 2 refills | Status: AC

## 2022-04-11 MED ORDER — ERGOCALCIFEROL 1.25 MG (50000 UT) PO CAPS: 50000.0000 [IU] | ORAL_CAPSULE | ORAL | 0 refills | Status: AC

## 2022-04-11 NOTE — Patient Instructions (Signed)
______________________________________________________________________  Preparing for your procedure  During your procedure appointment there will be: No Prescription Refills. No disability issues to discussed. No medication changes or discussions.  Instructions: Food intake: Avoid eating anything solid for at least 8 hours prior to your procedure. Clear liquid intake: You may take clear liquids such as water up to 2 hours prior to your procedure. (No carbonated drinks. No soda.) Transportation: Unless otherwise stated by your physician, bring a driver. Morning Medicines: Except for blood thinners, take all of your other morning medications with a sip of water. Make sure to take your heart and blood pressure medicines. If your blood pressure's lower number is above 100, the case will be rescheduled. Blood thinners: If you take a blood thinner, but were not instructed to stop it, call our office [phone] and ask to talk to a nurse. Not stopping a blood thinner prior to certain procedures could lead to serious complications. Diabetics on insulin: Notify the staff so that you can be scheduled 1st case in the morning. If your diabetes requires high dose insulin, take only  of your normal insulin dose the morning of the procedure and notify the staff that you have done so. Preventing infections: Shower with an antibacterial soap the morning of your procedure.  Build-up your immune system: Take 1000 mg of Vitamin C with every meal (3 times a day) the day prior to your procedure. Antibiotics: Inform the nursing staff if you are taking any antibiotics or if you have any conditions that may require antibiotics prior to procedures. (Example: recent joint implants)   Pregnancy: If you are pregnant make sure to notify the nursing staff. Not doing so may result in injury to the fetus, including death.  Sickness: If you have a cold, fever, or any active infections, call and cancel or reschedule your  procedure. Receiving steroids while having an infection may result in complications. Arrival: You must be in the facility at least 30 minutes prior to your scheduled procedure. Tardiness: Your scheduled time is also the cutoff time. If you do not arrive at least 15 minutes prior to your procedure, you will be rescheduled.  Children: Do not bring any children with you. Make arrangements to keep them home. Dress appropriately: There is always a possibility that your clothing may get soiled. Avoid long dresses. Valuables: Do not bring any jewelry or valuables.  Reasons to call and reschedule or cancel your procedure: (Following these recommendations will minimize the risk of a serious complication.) Surgeries: Avoid having procedures within 2 weeks of any surgery. (Avoid for 2 weeks before or after any surgery). Flu Shots: Avoid having procedures within 2 weeks of a flu shots or . (Avoid for 2 weeks before or after immunizations). Barium: Avoid having a procedure within 7-10 days after having had a radiological study involving the use of radiological contrast. (Myelograms, Barium swallow or enema study). Heart attacks: Avoid any elective procedures or surgeries for the initial 6 months after a "Myocardial Infarction" (Heart Attack). Blood thinners: It is imperative that you stop these medications before procedures. Let us know if you if you take any blood thinner.  Infection: Avoid procedures during or within two weeks of an infection (including chest colds or gastrointestinal problems). Symptoms associated with infections include: Localized redness, fever, chills, night sweats or profuse sweating, burning sensation when voiding, cough, congestion, stuffiness, runny nose, sore throat, diarrhea, nausea, vomiting, cold or Flu symptoms, recent or current infections. It is specially important if the infection is   over the area that we intend to treat. Heart and lung problems: Symptoms that may suggest an  active cardiopulmonary problem include: cough, chest pain, breathing difficulties or shortness of breath, dizziness, ankle swelling, uncontrolled high or unusually low blood pressure, and/or palpitations. If you are experiencing any of these symptoms, cancel your procedure and contact your primary care physician for an evaluation.  Remember:  Regular Business hours are:  Monday to Thursday 8:00 AM to 4:00 PM  Provider's Schedule: Greig Altergott, MD:  Procedure days: Tuesday and Thursday 7:30 AM to 4:00 PM  Bilal Lateef, MD:  Procedure days: Monday and Wednesday 7:30 AM to 4:00 PM  ______________________________________________________________________    ____________________________________________________________________________________________  General Risks and Possible Complications  Patient Responsibilities: It is important that you read this as it is part of your informed consent. It is our duty to inform you of the risks and possible complications associated with treatments offered to you. It is your responsibility as a patient to read this and to ask questions about anything that is not clear or that you believe was not covered in this document.  Patient's Rights: You have the right to refuse treatment. You also have the right to change your mind, even after initially having agreed to have the treatment done. However, under this last option, if you wait until the last second to change your mind, you may be charged for the materials used up to that point.  Introduction: Medicine is not an exact science. Everything in Medicine, including the lack of treatment(s), carries the potential for danger, harm, or loss (which is by definition: Risk). In Medicine, a complication is a secondary problem, condition, or disease that can aggravate an already existing one. All treatments carry the risk of possible complications. The fact that a side effects or complications occurs, does not imply  that the treatment was conducted incorrectly. It must be clearly understood that these can happen even when everything is done following the highest safety standards.  No treatment: You can choose not to proceed with the proposed treatment alternative. The "PRO(s)" would include: avoiding the risk of complications associated with the therapy. The "CON(s)" would include: not getting any of the treatment benefits. These benefits fall under one of three categories: diagnostic; therapeutic; and/or palliative. Diagnostic benefits include: getting information which can ultimately lead to improvement of the disease or symptom(s). Therapeutic benefits are those associated with the successful treatment of the disease. Finally, palliative benefits are those related to the decrease of the primary symptoms, without necessarily curing the condition (example: decreasing the pain from a flare-up of a chronic condition, such as incurable terminal cancer).  General Risks and Complications: These are associated to most interventional treatments. They can occur alone, or in combination. They fall under one of the following six (6) categories: no benefit or worsening of symptoms; bleeding; infection; nerve damage; allergic reactions; and/or death. No benefits or worsening of symptoms: In Medicine there are no guarantees, only probabilities. No healthcare provider can ever guarantee that a medical treatment will work, they can only state the probability that it may. Furthermore, there is always the possibility that the condition may worsen, either directly, or indirectly, as a consequence of the treatment. Bleeding: This is more common if the patient is taking a blood thinner, either prescription or over the counter (example: Goody Powders, Fish oil, Aspirin, Garlic, etc.), or if suffering a condition associated with impaired coagulation (example: Hemophilia, cirrhosis of the liver, low platelet counts, etc.). However, even if   you  do not have one on these, it can still happen. If you have any of these conditions, or take one of these drugs, make sure to notify your treating physician. Infection: This is more common in patients with a compromised immune system, either due to disease (example: diabetes, cancer, human immunodeficiency virus [HIV], etc.), or due to medications or treatments (example: therapies used to treat cancer and rheumatological diseases). However, even if you do not have one on these, it can still happen. If you have any of these conditions, or take one of these drugs, make sure to notify your treating physician. Nerve Damage: This is more common when the treatment is an invasive one, but it can also happen with the use of medications, such as those used in the treatment of cancer. The damage can occur to small secondary nerves, or to large primary ones, such as those in the spinal cord and brain. This damage may be temporary or permanent and it may lead to impairments that can range from temporary numbness to permanent paralysis and/or brain death. Allergic Reactions: Any time a substance or material comes in contact with our body, there is the possibility of an allergic reaction. These can range from a mild skin rash (contact dermatitis) to a severe systemic reaction (anaphylactic reaction), which can result in death. Death: In general, any medical intervention can result in death, most of the time due to an unforeseen complication. ____________________________________________________________________________________________    

## 2022-04-11 NOTE — Progress Notes (Signed)
Safety precautions to be maintained throughout the outpatient stay will include: orient to surroundings, keep bed in low position, maintain call bell within reach at all times, provide assistance with transfer out of bed and ambulation.  

## 2022-04-19 ENCOUNTER — Other Ambulatory Visit: Payer: Self-pay

## 2022-05-09 ENCOUNTER — Other Ambulatory Visit: Payer: Self-pay

## 2022-05-09 ENCOUNTER — Other Ambulatory Visit: Payer: Self-pay | Admitting: Nurse Practitioner

## 2022-05-09 DIAGNOSIS — I10 Essential (primary) hypertension: Secondary | ICD-10-CM

## 2022-05-12 ENCOUNTER — Other Ambulatory Visit: Payer: Self-pay

## 2022-05-12 ENCOUNTER — Other Ambulatory Visit: Payer: Self-pay | Admitting: Nurse Practitioner

## 2022-05-12 DIAGNOSIS — Z760 Encounter for issue of repeat prescription: Secondary | ICD-10-CM

## 2022-05-12 DIAGNOSIS — N529 Male erectile dysfunction, unspecified: Secondary | ICD-10-CM

## 2022-05-12 MED ORDER — SILDENAFIL CITRATE 100 MG PO TABS
50.0000 mg | ORAL_TABLET | Freq: Every day | ORAL | 3 refills | Status: DC | PRN
  Filled 2022-05-12: qty 30, 90d supply, fill #0
  Filled 2022-10-05: qty 30, 90d supply, fill #1
  Filled 2023-02-17: qty 30, 90d supply, fill #2

## 2022-05-12 NOTE — Telephone Encounter (Signed)
[facility] is requesting to fill pt sildenafil. Pleas advise [facility]

## 2022-05-13 ENCOUNTER — Other Ambulatory Visit: Payer: Self-pay | Admitting: Nurse Practitioner

## 2022-05-13 ENCOUNTER — Other Ambulatory Visit: Payer: Self-pay

## 2022-05-13 DIAGNOSIS — I10 Essential (primary) hypertension: Secondary | ICD-10-CM

## 2022-05-17 ENCOUNTER — Other Ambulatory Visit: Payer: Self-pay

## 2022-05-17 MED ORDER — LOSARTAN POTASSIUM 50 MG PO TABS
50.0000 mg | ORAL_TABLET | Freq: Every day | ORAL | 1 refills | Status: DC
  Filled 2022-05-17: qty 90, 90d supply, fill #0
  Filled 2022-05-19: qty 30, 30d supply, fill #0
  Filled 2022-07-29: qty 30, 30d supply, fill #1
  Filled 2022-10-05: qty 30, 30d supply, fill #2
  Filled 2022-12-13: qty 30, 30d supply, fill #3
  Filled 2023-02-17: qty 30, 30d supply, fill #4

## 2022-05-17 MED ORDER — AMLODIPINE BESYLATE 10 MG PO TABS
10.0000 mg | ORAL_TABLET | Freq: Every day | ORAL | 1 refills | Status: DC
  Filled 2022-05-17 – 2022-05-19 (×2): qty 30, 30d supply, fill #0
  Filled 2022-07-29: qty 30, 30d supply, fill #1
  Filled 2022-10-05: qty 30, 30d supply, fill #2
  Filled 2022-12-13: qty 30, 30d supply, fill #3
  Filled 2023-02-17: qty 30, 30d supply, fill #4

## 2022-05-19 ENCOUNTER — Other Ambulatory Visit: Payer: Self-pay

## 2022-07-29 ENCOUNTER — Other Ambulatory Visit: Payer: Self-pay

## 2022-07-29 ENCOUNTER — Other Ambulatory Visit: Payer: Self-pay | Admitting: Nurse Practitioner

## 2022-07-29 DIAGNOSIS — E7849 Other hyperlipidemia: Secondary | ICD-10-CM

## 2022-07-29 MED ORDER — ATORVASTATIN CALCIUM 40 MG PO TABS
40.0000 mg | ORAL_TABLET | Freq: Every day | ORAL | 2 refills | Status: DC
  Filled 2022-07-29: qty 30, 30d supply, fill #0
  Filled 2022-10-05: qty 30, 30d supply, fill #1
  Filled 2022-12-13: qty 30, 30d supply, fill #2

## 2022-07-30 ENCOUNTER — Other Ambulatory Visit: Payer: Self-pay

## 2022-07-30 ENCOUNTER — Emergency Department (HOSPITAL_BASED_OUTPATIENT_CLINIC_OR_DEPARTMENT_OTHER)
Admission: EM | Admit: 2022-07-30 | Discharge: 2022-07-30 | Disposition: A | Discharge status: Home / Self Care | Payer: 59 | Attending: Emergency Medicine | Admitting: Emergency Medicine

## 2022-07-30 ENCOUNTER — Encounter (HOSPITAL_BASED_OUTPATIENT_CLINIC_OR_DEPARTMENT_OTHER): Payer: Self-pay | Admitting: Emergency Medicine

## 2022-07-30 DIAGNOSIS — L0291 Cutaneous abscess, unspecified: Secondary | ICD-10-CM

## 2022-07-30 DIAGNOSIS — L02214 Cutaneous abscess of groin: Secondary | ICD-10-CM | POA: Insufficient documentation

## 2022-07-30 DIAGNOSIS — Z79899 Other long term (current) drug therapy: Secondary | ICD-10-CM | POA: Insufficient documentation

## 2022-07-30 DIAGNOSIS — I10 Essential (primary) hypertension: Secondary | ICD-10-CM | POA: Insufficient documentation

## 2022-07-30 MED ORDER — KETOROLAC TROMETHAMINE 60 MG/2ML IM SOLN
60.0000 mg | Freq: Once | INTRAMUSCULAR | Status: AC
  Administered 2022-07-30: 60 mg via INTRAMUSCULAR
  Filled 2022-07-30: qty 2

## 2022-07-30 MED ORDER — SULFAMETHOXAZOLE-TRIMETHOPRIM 800-160 MG PO TABS: 1.0000 | ORAL_TABLET | Freq: Two times a day (BID) | ORAL | 0 refills | Status: AC

## 2022-07-30 NOTE — ED Triage Notes (Signed)
Pt presents with abscess on buttock area for past 3 days. Pt has history of same.

## 2022-07-30 NOTE — ED Provider Notes (Signed)
[place] [facility] [place] Provider Note   CSN: [id number] Arrival date & time: 07/30/22  1153     History  Chief Complaint  Patient presents with   Abscess    Michael Fleming is a 52 y.o. male.  HPI     52yo male with history of htn, hlpd, prior seizures,presents with concern for abscess.    3 days ago noticed swelling to right groin, second day started draining, feeling some pus Feels improved since it drained but does have some continued pain  No fever, no nausea, vomiting or abdominal pain Reports area on right groin.    Past Medical History:  Diagnosis Date   Allergy    Biceps tendon tear    right   Heart palpitations 12/2018   Hyperlipidemia    Hypertension    Hypokalemia 12/2018   Seizures ([place])    last seizure age 68 due to blood clot- 1981 none since     Home Medications Prior to Admission medications   Medication Sig Start Date End Date Taking? Authorizing Provider  sulfamethoxazole-trimethoprim (BACTRIM DS) 800-160 MG tablet Take 1 tablet by mouth 2 (two) times daily for 7 days. 07/30/22 08/06/22 Yes Gareth Morgan, MD  amLODipine (NORVASC) 10 MG tablet Take 1 tablet (10 mg total) by mouth daily. 05/17/22   Fenton Foy, NP  atorvastatin (LIPITOR) 40 MG tablet Take 1 tablet (40 mg total) by mouth daily. 07/29/22 10/27/22  Fenton Foy, NP  losartan (COZAAR) 50 MG tablet Take 1 tablet (50 mg total) by mouth daily. 05/17/22   Fenton Foy, NP  sildenafil (VIAGRA) 100 MG tablet Take 0.5-1 tablets (50-100 mg total) by mouth daily as needed for erectil dysfunction. 05/12/22   Fenton Foy, NP      Allergies    Patient has no known allergies.    Review of Systems   Review of Systems  Physical Exam Updated Vital Signs BP (!) 151/91 (BP Location: Left Arm)   Pulse 72   Temp 98 F (36.7 C) (Oral)   Resp 18   Ht '5\' 7"'$  (1.702 m)   Wt 98.9 kg   SpO2 99%   BMI 34.14 kg/m  Physical Exam Vitals and nursing note  reviewed.  Constitutional:      General: He is not in acute distress.    Appearance: Normal appearance. He is not ill-appearing, toxic-appearing or diaphoretic.  HENT:     Head: Normocephalic.  Eyes:     Conjunctiva/sclera: Conjunctivae normal.  Cardiovascular:     Rate and Rhythm: Normal rate and regular rhythm.     Pulses: Normal pulses.  Pulmonary:     Effort: Pulmonary effort is normal. No respiratory distress.  Musculoskeletal:        General: No deformity or signs of injury.     Cervical back: No rigidity.     Comments: Right groin2x1cm area of induration, small area with bleeding No scrotal tenderness or perianal extension   Skin:    General: Skin is warm and dry.     Coloration: Skin is not jaundiced or pale.  Neurological:     General: No focal deficit present.     Mental Status: He is alert and oriented to person, place, and time.     ED Results / Procedures / Treatments   Labs (all labs ordered are listed, but only abnormal results are displayed) Labs Reviewed - No data to display  EKG None  Radiology No results found.  Procedures Procedures    Medications Ordered in ED Medications  ketorolac (TORADOL) injection 60 mg (60 mg Intramuscular Given 07/30/22 1437)    ED Course/ Medical Decision Making/ A&P                              52yo male with history of htn, hlpd, prior seizures,presents with concern for abscess.  Exam with area of fluctuance, has spontaneously drained.  [place] with very small area .5cm area.  Discussed could make incision and drain this area but unclear if will have significant drainage given previous spontaneous drainage and improvement of pain, also discussed reasonable to trial antibiotics with plan to return if swelling again increases.  He would like to trial abx with strict return precautions.  Patient discharged in stable condition with understanding of reasons to return.         Final Clinical Impression(s) / ED  Diagnoses Final diagnoses:  Abscess    Rx / DC Orders ED Discharge Orders          Ordered    sulfamethoxazole-trimethoprim (BACTRIM DS) 800-160 MG tablet  2 times daily        07/30/22 1426              Gareth Morgan, MD 07/30/22 2108

## 2022-08-01 ENCOUNTER — Other Ambulatory Visit: Payer: Self-pay

## 2022-08-02 ENCOUNTER — Other Ambulatory Visit: Payer: Self-pay

## 2022-08-09 ENCOUNTER — Telehealth: Payer: Self-pay

## 2022-08-09 NOTE — Progress Notes (Signed)
Patient attempted to be outreached by Darrall Dears, PharmD Candidate on 08/08/2022 to discuss hypertension. Left voicemail for patient to return our call at their convenience at [phone].   Joseph Art, Pharm.D. PGY-2 [facility] Resident

## 2022-08-11 NOTE — Telephone Encounter (Signed)
Patient attempted to be outreached by Donney Rankins, PharmD Candidate on 08/11/2022 to discuss hypertension. Left voicemail for patient to return our call at their convenience at [phone].   [place] [facility]  PharmD Candidate 2024   Maryan Puls, PharmD PGY-1 [facility] Pharm[facility] Resident

## 2022-08-16 NOTE — Progress Notes (Signed)
Patient outreached by Georga Kaufmann, PharmD Candidate on 08/16/22 to discuss hypertension    Patient does not have an automated home blood pressure machine. Encouraged patient to get a BP cuff and educated on importance of home BP monitoring.    Medication review was performed. They are taking medications as prescribed.   The following barriers to adherence were noted:  - They do not have cost concerns.  - They do not have transportation concerns.  - They do not need assistance obtaining refills.  - They do not occasionally forget to take some of their prescribed medications.  - They do not feel like one/some of their medications make them feel poorly.  - They do not have questions or concerns about their medications.  - They do have follow up scheduled with their primary care provider/cardiologist.   The following interventions were completed:  - Medications were reviewed  - Patient was educated on goal blood pressures and long term health implications of elevated blood pressure  - Patient was educated on medications, including indication and administration  - Patient was educated on how to access home blood pressure machine  - Patient was counseled on lifestyle modifications to improve blood pressure, including DASH diet and physical activity   The patient has follow up scheduled: 10/07/22  PCP: Lazaro Arms, NP    Georga Kaufmann, PharmD Candidate      Maryan Puls, PharmD PGY-1 [facility] Pharmacy Resident

## 2022-09-27 NOTE — Progress Notes (Unsigned)
PROVIDER NOTE: Information contained herein reflects review and annotations entered in association with encounter. Interpretation of such information and data should be left to medically-trained personnel. Information provided to patient can be located elsewhere in the medical record under "Patient Instructions". Document created using STT-dictation technology, any transcriptional errors that may result from process are unintentional.    Patient: Michael Fleming  Service Category: E/M  Provider: Oswaldo Done, MD  DOB: 06-12-1970  DOS: 09/28/2022  Referring Provider: No ref. provider found  MRN: [id number]  Specialty: Interventional Pain Management  PCP: Kathrynn Speed, NP (Inactive)  Type: Established Patient  Setting: Ambulatory outpatient    Location: Office  Delivery: Face-to-face     HPI  [name], a 52 y.o. year old male, is here today because of his Chronic right shoulder pain [M25.511, G89.29]. [name]'s primary complain today is Shoulder Pain (Bilateral )  Pertinent problems: [name] has Chronic headache; Pain in wrist (Right); Chronic pain of left knee; Back pain; Nontraumatic tear of supraspinatus tendon (Right); Tendinopathy of biceps tendon (Right); Degenerative superior 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) tear of shoulder (Right); Tendinosis of rotator cuff (Right); Arthritis of right acromioclavicular joint; Chronic pain syndrome; Chronic shoulder pain (1ry area of Pain) (Right); Partial tear of subscapularis tendon, sequela (Right); Traumatic rupture of biceps tendon, sequela (Right); Cervicogenic headache; DDD (degenerative disc disease), cervical; Cervical foraminal stenosis (Bilateral); Radicular pain of shoulder; and Cervical facet hypertrophy on their pertinent problem list. Pain Assessment: Severity of Chronic pain is reported as a 6 /10. Location: Shoulder Right, Left/Radaites from shoulder bilateral into arms down to fingers. Onset: More than a month ago.  Quality: Constant, Aching, Grimacing, Cramping, Numbness, Tightness, Pounding. Timing: Constant. Modifying factor(s): Denies. Vitals:  height is 5\' 7"  (1.702 m) and weight is 215 lb (97.5 kg). His temporal temperature is 97.4 F (36.3 C) (abnormal). His blood pressure is 158/89 (abnormal) and his pulse is 76. His respiration is 18 and oxygen saturation is 100%.  BMI: Estimated body mass index is 33.67 kg/m as calculated from the following:   Height as of this encounter: 5\' 7"  (1.702 m).   Weight as of this encounter: 215 lb (97.5 kg). Last encounter: 04/11/2022. Last procedure: Visit date not found.  Reason for encounter: evaluation of worsening, or previously known (established) problem.  The schedule indicates that this appointment was requested by the patient to see what we would be doing for his right shoulder pain.  I am a little confused since on his last appointment on 04/11/2022 I had already evaluated his case and had determined that he needed to come in for a right suprascapular nerve block with the idea of moving onto radiofrequency ablation should he attained good relief with the diagnostic injection.  However, for some reason this was not scheduled.  Today he comes in complaining of increased bilateral shoulder pain that seems to radiate all the way down to the hand.  He does have an x-ray of the cervical spine done in 2020 that revealed degenerative disc disease of the cervical spine with multilevel discogenic degenerative changes that seem to be greatest at the C4-C6 levels where there is mild loss of intervertebral disc space height and small endplate marginal osteophytes.  There is also uncovertebral hypertrophy encroaching on the neuroforamina at the left C4-T1 levels in the right C4-5 level.  An MRI of the right shoulder done on 10/29/2021 was compared to a prior 03/01/2021 MRI and it shows the following impression: 1. Screw anchor at  the far anterior aspect of the greater tuberosity  and the superior aspect of the lesser tuberosity. There appear to be postsurgical changes of interval rotator cuff repair and biceps tenotomy. 2. Interval worsening in moderate articular sided and midsubstance tears of the mid and anterior supraspinatus tendon footprint. 3. Interval repair of the prior extensive interstitial subscapularis tendon tear. Minimal residual superior subscapularis interstitial tear. 4. Mild-to-moderate degenerative changes of the acromioclavicular joint.  Today he indicates having bilateral shoulder pain that lately seems to be worse on the left side when compared to the right.  He refers that when he attempts to do shoulder extension with internal rotation his shoulder will lock like if he was having a spasm.  He refers that this is happening to both shoulders.  In the case of the right side he is also having pain and numbness that goes all the way down into his hand affecting his thumb, index finger, and middle finger and what appears to be a C6/C7 radiculopathy.  He states having had shoulder surgery on the right shoulder, but none on the left.  Based on the above information, today I have informed the patient that he is likely to be having multifactorial shoulder pain with a component coming from the shoulder itself, but also having symptoms compatible with a cervical radiculopathy.  Today I will go ahead and order an MRI of the cervical spine and I will tentatively schedule him for either a right suprascapular nerve block or a right-sided cervical epidural steroid injection, depending on the results of the MRI.  The plan was shared with the patient who understood and accepted.  Pharmacotherapy Assessment  Analgesic: No chronic opioid analgesics therapy prescribed by our practice. None MME/day: 0 mg/day   Monitoring: [place] PMP: PDMP reviewed during this encounter.       Pharmacotherapy: No side-effects or adverse reactions reported. Compliance: No problems  identified. Effectiveness: Clinically acceptable.  Earlyne Iba, RN  09/28/2022 11:25 AM  Sign when Signing Visit Safety precautions to be maintained throughout the outpatient stay will include: orient to surroundings, keep bed in low position, maintain call bell within reach at all times, provide assistance with transfer out of bed and ambulation.     No results found for: "CBDTHCR" No results found for: "D8THCCBX" No results found for: "D9THCCBX"  UDS:  Summary  Date Value Ref Range Status  02/16/2022 Note  Final    Comment:    ==================================================================== Compliance Drug Analysis, Ur ==================================================================== Test                             Result       Flag       Units    NO DRUGS DETECTED. ==================================================================== Test                      Result    Flag   Units      Ref Range   Creatinine              28               mg/dL      >=29 ==================================================================== Declared Medications:  The flagging and interpretation on this report are based on the  following declared medications.  Unexpected results may arise from  inaccuracies in the declared medications.   **Note: The testing scope of this panel does not include the  following reported medications:  Amlodipine (Norvasc)  Atorvastatin (Lipitor)  Losartan (Cozaar) ==================================================================== For clinical consultation, please call [phone]. ====================================================================       ROS  Constitutional: Denies any fever or chills Gastrointestinal: No reported hemesis, hematochezia, vomiting, or acute GI distress Musculoskeletal: Denies any acute onset joint swelling, redness, loss of ROM, or weakness Neurological: No reported episodes of acute onset apraxia, aphasia,  dysarthria, agnosia, amnesia, paralysis, loss of coordination, or loss of consciousness  Medication Review  amLODipine, atorvastatin, losartan, and sildenafil  History Review  Allergy: [name] has No Known Allergies. Drug: [name]  reports no history of drug use. Alcohol:  reports current alcohol use. Tobacco:  reports that he has never smoked. He has never used smokeless tobacco. Social: Mr. Cossairt  reports that he has never smoked. He has never used smokeless tobacco. He reports current alcohol use. He reports that he does not use drugs. Medical:  has a past medical history of Allergy, Biceps tendon tear, Heart palpitations (12/2018), Hyperlipidemia, Hypertension, Hypokalemia (12/2018), and Seizures (HCC). Surgical: Mr. Akerman  has a past surgical history that includes Brain surgery; Foot surgery; Knee arthroscopy (Left); Wrist surgery (Left); and Shoulder arthroscopy with subacromial decompression and bicep tendon repair (Right, 03/31/2021). Family: family history includes Healthy in his mother; Hypertension in his father.  Laboratory Chemistry Profile   Renal Lab Results  Component Value Date   BUN 10 04/08/2022   CREATININE 1.17 04/08/2022   BCR 9 04/08/2022   GFRAA 82 08/05/2019   GFRNONAA 71 08/05/2019    Hepatic Lab Results  Component Value Date   AST 29 04/08/2022   Fleming 28 04/08/2022   ALBUMIN 4.7 04/08/2022   ALKPHOS 92 04/08/2022    Electrolytes Lab Results  Component Value Date   NA 138 04/08/2022   K 4.0 04/08/2022   CL 101 04/08/2022   CALCIUM 9.9 04/08/2022   MG 1.9 02/16/2022    Bone Lab Results  Component Value Date   VD25OH 24.4 (L) 08/05/2020   25OHVITD1 25 (L) 02/16/2022   25OHVITD2 2.0 02/16/2022   25OHVITD3 23 02/16/2022    Inflammation (CRP: Acute Phase) (ESR: Chronic Phase) Lab Results  Component Value Date   CRP 5 02/16/2022   ESRSEDRATE 17 02/16/2022         Note: Above Lab results reviewed.  Recent Imaging Review  [place] LIMITED  JOINT SPACE STRUCTURES UP RIGHT(NO LINKED CHARGES) Procedure: Real-time Ultrasound Guided Injection of right AC joint Device: Philips Affiniti 50G Images permanently stored and available for review in PACS Verbal informed consent obtained.  Discussed risks and benefits of  procedure. Warned about infection, bleeding, hyperglycemia damage to  structures among others. Patient expresses understanding and agreement Time-out conducted.   Noted no overlying erythema, induration, or other signs of local  infection.   Skin prepped in a sterile fashion.   Local anesthesia: Topical Ethyl chloride.   With sterile technique and under real time ultrasound guidance: 40 mg of  Kenalog and 1 mL of Marcaine injected into AC joint. Fluid seen entering  the joint capsule.   Completed without difficulty   Pain immediately resolved suggesting accurate placement of the medication.    Advised to call if fevers/chills, erythema, induration, drainage, or  persistent bleeding.   Images permanently stored and available for review in the ultrasound unit.   Impression: Technically successful ultrasound guided injection Note: Reviewed        Physical Exam  General appearance: Well nourished, well developed, and well hydrated. In no apparent acute  distress Mental status: Alert, oriented x 3 (person, place, & time)       Respiratory: No evidence of acute respiratory distress Eyes: PERLA Vitals: BP (!) 158/89   Pulse 76   Temp (!) 97.4 F (36.3 C) (Temporal)   Resp 18   Ht 5\' 7"  (1.702 m)   Wt 215 lb (97.5 kg)   SpO2 100%   BMI 33.67 kg/m  BMI: Estimated body mass index is 33.67 kg/m as calculated from the following:   Height as of this encounter: 5\' 7"  (1.702 m).   Weight as of this encounter: 215 lb (97.5 kg). Ideal: Ideal body weight: 66.1 kg (145 lb 11.6 oz) Adjusted ideal body weight: 78.7 kg (173 lb 6.9 oz)  Assessment   Diagnosis Status  1. Chronic shoulder pain (1ry area of Pain) (Right)    2. Degenerative superior 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) tear of shoulder (Right)   3. Partial tear of subscapularis tendon, sequela (Right)   4. Tendinopathy of biceps tendon (Right)   5. Tendinosis of rotator cuff (Right)   6. Traumatic rupture of biceps tendon, sequela (Right)   7. Nontraumatic tear of supraspinatus tendon (Right)   8. Cervicalgia   9. Cervicogenic headache   10. DDD (degenerative disc disease), cervical   11. Cervical foraminal stenosis (Bilateral)   12. Radicular pain of shoulder   13. Cervical facet hypertrophy    Persistent Persistent Persistent   Updated Problems: Problem  Cervicogenic Headache  Ddd (Degenerative Disc Disease), Cervical   Multilevel discogenic degenerative changes are present greatest at the C4-C6 levels where there is mild loss of intervertebral disc space height and small endplate marginal osteophytes. Uncovertebral hypertrophy encroaches on the neural foramen at the left C4-T1 levels and the right C4-5 level.   Cervical foraminal stenosis (Bilateral)   Uncovertebral hypertrophy encroaches on the neural foramen at the left C4-T1 levels and the right C4-5 level.   Radicular Pain of Shoulder  Cervical facet hypertrophy   Uncovertebral hypertrophy encroaches on the neural foramen at the left C4-T1 levels and the right C4-5 level     Plan of Care  Problem-specific:  No problem-specific Assessment & Plan notes found for this encounter.  Michael Fleming has a current medication list which includes the following long-term medication(s): amlodipine, atorvastatin, losartan, and sildenafil.  Pharmacotherapy (Medications Ordered): No orders of the defined types were placed in this encounter.  Orders:  Orders Placed This Encounter  Procedures   SUPRASCAPULAR NERVE BLOCK    For shoulder pain.    Standing Status:   Future    Standing Expiration Date:   12/28/2022    Scheduling Instructions:     Purpose: Diagnostic     Laterality:  Right-sided     Level(s): Suprascapular notch     Sedation: Patient's choice.     Scheduling Timeframe: ASAP    Order Specific Question:   Where will this procedure be performed?    Answer:   [facility]   MR CERVICAL SPINE WO CONTRAST    Patient presents with axial pain with possible radicular component. Please assist [place] in identifying specific level(s) and laterality of any additional findings such as: 1. Facet (Zygapophyseal) joint DJD (Hypertrophy, space narrowing, subchondral sclerosis, and/or osteophyte formation) 2. DDD and/or IVDD (Loss of disc height, desiccation, gas patterns, osteophytes, endplate sclerosis, or "Black disc disease") 3. Pars defects 4. Spondylolisthesis, spondylosis, and/or spondyloarthropathies (include Degree/Grade of displacement in mm) (stability) 5. Vertebral body Fractures (acute/chronic) (state percentage of collapse) 6.  Demineralization (osteopenia/osteoporotic) 7. Bone pathology 8. Foraminal narrowing  9. Surgical changes 10. Central, Lateral Recess, and/or Foraminal Stenosis (include AP diameter of stenosis in mm) 11. Surgical changes (hardware type, status, and presence of fibrosis) 12. Modic Type Changes (MRI only) 13. IVDD (Disc bulge, protrusion, herniation, extrusion) (Level, laterality, extent)    Standing Status:   Future    Standing Expiration Date:   10/29/2022    Scheduling Instructions:     Please make sure that the patient understands that this needs to be done as soon as possible. Never have the patient do the imaging "just before the next appointment". Inform patient that having the imaging done within the [facility] will expedite the availability of the results and will provide      imaging availability to the requesting physician. In addition inform the patient that the imaging order has an expiration date and will not be renewed if not done within the active period.    Order Specific Question:   What is the patient's sedation  requirement?    Answer:   No Sedation    Order Specific Question:   Does the patient have a pacemaker or implanted devices?    Answer:   No    Order Specific Question:   Preferred imaging location?    Answer:   [facility] (table limit-350lbs)    Order Specific Question:   Call Results- Best Contact Number?    Answer:   [phone] [facility])    Order Specific Question:   Radiology Contrast Protocol - do NOT remove file path    Answer:   [email or web address]   Follow-up plan:   Return for (ECT): (R) SSNB #1 vs (R) CESI #1.      Interventional Therapies  Risk  Complexity Considerations:   Estimated body mass index is 33.36 kg/m as calculated from the following:   Height as of this encounter: 5\' 7"  (1.702 m).   Weight as of this encounter: 213 lb (96.6 kg). WNL   Planned  Pending:   Diagnostic/therapeutic right suprascapular NB #1    Under consideration:   Diagnostic/therapeutic right suprascapular NB #1  Possible therapeutic right suprascapular nerve RFA  Diagnostic/therapeutic right IA shoulder joint inj. #1    Completed:   None at this time   Completed by other providers:   Therapeutic right shoulder arthroscopy, extensive debridement, subacromial decompression, open biceps tenodesis (03/31/2021) by Dr. Gershon Mussel [facility])  Diagnostic/therapeutic right subacromial bursa inj. x1 (08/10/2021) by Dr. Gershon Mussel [facility])    Therapeutic  Palliative (PRN) options:   None established       Recent Visits No visits were found meeting these conditions. Showing recent visits within past 90 days and meeting all other requirements Today's Visits Date Type Provider Dept  09/28/22 Office Visit Delano Metz, MD [facility]  Showing today's visits and meeting all other requirements Future Appointments No visits were found meeting these conditions. Showing future appointments within next 90 days and meeting all  other requirements  I discussed the assessment and treatment plan with the patient. The patient was provided an opportunity to ask questions and all were answered. The patient agreed with the plan and demonstrated an understanding of the instructions.  Patient advised to call back or seek an in-person evaluation if the symptoms or condition worsens.  Duration of encounter: 30 minutes.  Total time on encounter, as per AMA guidelines included both the face-to-face and non-face-to-face time personally spent by the physician and/or other  qualified health care professional(s) on the day of the encounter (includes time in activities that require the physician or other qualified health care professional and does not include time in activities normally performed by clinical staff). Physician's time may include the following activities when performed: Preparing to see the patient (e.g., pre-charting review of records, searching for previously ordered imaging, lab work, and nerve conduction tests) Review of prior analgesic pharmacotherapies. Reviewing PMP Interpreting ordered tests (e.g., lab work, imaging, nerve conduction tests) Performing post-procedure evaluations, including interpretation of diagnostic procedures Obtaining and/or reviewing separately obtained history Performing a medically appropriate examination and/or evaluation Counseling and educating the patient/family/caregiver Ordering medications, tests, or procedures Referring and communicating with other health care professionals (when not separately reported) Documenting clinical information in the electronic or other health record Independently interpreting results (not separately reported) and communicating results to the patient/ family/caregiver Care coordination (not separately reported)  Note by: Oswaldo Done, MD Date: 09/28/2022; Time: 11:58 AM

## 2022-09-28 ENCOUNTER — Ambulatory Visit: Payer: 59 | Attending: Pain Medicine | Admitting: Pain Medicine

## 2022-09-28 ENCOUNTER — Encounter: Payer: Self-pay | Admitting: Pain Medicine

## 2022-09-28 VITALS — BP 158/89 | HR 76 | Temp 97.4°F | Resp 18 | Ht 67.0 in | Wt 215.0 lb

## 2022-09-28 DIAGNOSIS — M50121 Cervical disc disorder at C4-C5 level with radiculopathy: Secondary | ICD-10-CM | POA: Diagnosis not present

## 2022-09-28 DIAGNOSIS — S43431A Superior glenoid labrum lesion of right shoulder, initial encounter: Secondary | ICD-10-CM | POA: Insufficient documentation

## 2022-09-28 DIAGNOSIS — M67921 Unspecified disorder of synovium and tendon, right upper arm: Secondary | ICD-10-CM | POA: Diagnosis not present

## 2022-09-28 DIAGNOSIS — M67813 Other specified disorders of tendon, right shoulder: Secondary | ICD-10-CM | POA: Diagnosis not present

## 2022-09-28 DIAGNOSIS — M75101 Unspecified rotator cuff tear or rupture of right shoulder, not specified as traumatic: Secondary | ICD-10-CM | POA: Diagnosis not present

## 2022-09-28 DIAGNOSIS — G4486 Cervicogenic headache: Secondary | ICD-10-CM | POA: Diagnosis not present

## 2022-09-28 DIAGNOSIS — M4802 Spinal stenosis, cervical region: Secondary | ICD-10-CM | POA: Diagnosis not present

## 2022-09-28 DIAGNOSIS — S46811S Strain of other muscles, fascia and tendons at shoulder and upper arm level, right arm, sequela: Secondary | ICD-10-CM | POA: Diagnosis not present

## 2022-09-28 DIAGNOSIS — M5030 Other cervical disc degeneration, unspecified cervical region: Secondary | ICD-10-CM | POA: Diagnosis not present

## 2022-09-28 DIAGNOSIS — M5412 Radiculopathy, cervical region: Secondary | ICD-10-CM | POA: Diagnosis not present

## 2022-09-28 DIAGNOSIS — G8929 Other chronic pain: Secondary | ICD-10-CM | POA: Diagnosis not present

## 2022-09-28 DIAGNOSIS — M25511 Pain in right shoulder: Secondary | ICD-10-CM | POA: Insufficient documentation

## 2022-09-28 DIAGNOSIS — M542 Cervicalgia: Secondary | ICD-10-CM | POA: Diagnosis not present

## 2022-09-28 DIAGNOSIS — M47812 Spondylosis without myelopathy or radiculopathy, cervical region: Secondary | ICD-10-CM | POA: Insufficient documentation

## 2022-09-28 DIAGNOSIS — S46211S Strain of muscle, fascia and tendon of other parts of biceps, right arm, sequela: Secondary | ICD-10-CM | POA: Insufficient documentation

## 2022-09-28 NOTE — Progress Notes (Signed)
Safety precautions to be maintained throughout the outpatient stay will include: orient to surroundings, keep bed in low position, maintain call bell within reach at all times, provide assistance with transfer out of bed and ambulation.  

## 2022-09-28 NOTE — Patient Instructions (Signed)

## 2022-10-05 ENCOUNTER — Other Ambulatory Visit: Payer: Self-pay

## 2022-10-07 ENCOUNTER — Ambulatory Visit (INDEPENDENT_AMBULATORY_CARE_PROVIDER_SITE_OTHER): Payer: 59 | Admitting: Nurse Practitioner

## 2022-10-07 ENCOUNTER — Encounter: Payer: Self-pay | Admitting: Nurse Practitioner

## 2022-10-07 VITALS — BP 132/83 | HR 77 | Temp 98.1°F | Ht 67.0 in | Wt 216.6 lb

## 2022-10-07 DIAGNOSIS — Z13220 Encounter for screening for lipoid disorders: Secondary | ICD-10-CM | POA: Diagnosis not present

## 2022-10-07 DIAGNOSIS — Z125 Encounter for screening for malignant neoplasm of prostate: Secondary | ICD-10-CM | POA: Diagnosis not present

## 2022-10-07 DIAGNOSIS — Z131 Encounter for screening for diabetes mellitus: Secondary | ICD-10-CM | POA: Diagnosis not present

## 2022-10-07 DIAGNOSIS — I10 Essential (primary) hypertension: Secondary | ICD-10-CM

## 2022-10-07 NOTE — Assessment & Plan Note (Signed)
-   Lipid Panel  2. Diabetes mellitus screening  - Hemoglobin A1c  3. Essential hypertension  - CBC - Comprehensive metabolic panel  4. Prostate cancer screening  - PSA  Follow up:  Follow up in 6 months

## 2022-10-07 NOTE — Progress Notes (Signed)
@Patient  ID: Michael Fleming, male    DOB: 1970-12-28, 52 y.o.   MRN: [id number]  Chief Complaint  Patient presents with   Hypertension    Follow up    Referring provider: Orion Crook I, NP   HPI  Michael Fleming 52 y.o. male  has a past medical history of Allergy, Biceps tendon tear, Heart palpitations (12/2018), Hyperlipidemia, Hypertension, Hypokalemia (12/2018), and Seizures (HCC).    Patient presents today for follow-up visit for chronic conditions.  Overall he has been doing well.  Patient is compliant with medications.  He states that he does not have any new issues or concerns today.  He does need blood work today.  Denies f/c/s, n/v/d, hemoptysis, PND, leg swelling Denies chest pain or edema      No Known Allergies  Immunization History  Administered Date(s) Administered   Influenza,inj,Quad PF,6+ Mos 01/20/2017, 04/08/2022   PFIZER(Purple Top)SARS-COV-2 Vaccination 10/29/2019, 01/07/2020   Tdap 09/29/2016    Past Medical History:  Diagnosis Date   Allergy    Biceps tendon tear    right   Heart palpitations 12/2018   Hyperlipidemia    Hypertension    Hypokalemia 12/2018   Seizures (HCC)    last seizure age 66 due to blood clot- 1981 none since     Tobacco History: Social History   Tobacco Use  Smoking Status Never  Smokeless Tobacco Never   Counseling given: Not Answered   Outpatient Encounter Medications as of 10/07/2022  Medication Sig   amLODipine (NORVASC) 10 MG tablet Take 1 tablet (10 mg total) by mouth daily.   atorvastatin (LIPITOR) 40 MG tablet Take 1 tablet (40 mg total) by mouth daily.   losartan (COZAAR) 50 MG tablet Take 1 tablet (50 mg total) by mouth daily.   sildenafil (VIAGRA) 100 MG tablet Take 0.5-1 tablets (50-100 mg total) by mouth daily as needed for erectil dysfunction.   No facility-administered encounter medications on file as of 10/07/2022.     Review of Systems  Review of Systems  Constitutional: Negative.    HENT: Negative.    Cardiovascular: Negative.   Gastrointestinal: Negative.   Allergic/Immunologic: Negative.   Neurological: Negative.   Psychiatric/Behavioral: Negative.         Physical Exam  BP 132/83   Pulse 77   Temp 98.1 F (36.7 C)   Ht 5\' 7"  (1.702 m)   Wt 216 lb 9.6 oz (98.2 kg)   SpO2 97%   BMI 33.92 kg/m   Wt Readings from Last 5 Encounters:  10/07/22 216 lb 9.6 oz (98.2 kg)  09/28/22 215 lb (97.5 kg)  07/30/22 218 lb (98.9 kg)  04/11/22 213 lb (96.6 kg)  04/08/22 213 lb (96.6 kg)     Physical Exam Vitals and nursing note reviewed.  Constitutional:      General: He is not in acute distress.    Appearance: He is well-developed.  Cardiovascular:     Rate and Rhythm: Normal rate and regular rhythm.  Pulmonary:     Effort: Pulmonary effort is normal.     Breath sounds: Normal breath sounds.  Skin:    General: Skin is warm and dry.  Neurological:     Mental Status: He is alert and oriented to person, place, and time.      Lab Results:  CBC    Component Value Date/Time   WBC 5.3 04/08/2022 0845   WBC 6.1 01/08/2019 0055   RBC 4.69 04/08/2022 0845   RBC 4.68 01/08/2019  0055   HGB 13.6 04/08/2022 0845   HCT 40.3 04/08/2022 0845   PLT 275 04/08/2022 0845   MCV 86 04/08/2022 0845   MCH 29.0 04/08/2022 0845   MCH 28.4 01/08/2019 0055   MCHC 33.7 04/08/2022 0845   MCHC 33.9 01/08/2019 0055   RDW 12.7 04/08/2022 0845   LYMPHSABS 3.2 (H) 08/06/2021 1621   MONOABS 0.3 01/08/2019 0055   EOSABS 0.1 08/06/2021 1621   BASOSABS 0.1 08/06/2021 1621    BMET    Component Value Date/Time   NA 138 04/08/2022 0845   K 4.0 04/08/2022 0845   CL 101 04/08/2022 0845   CO2 26 04/08/2022 0845   GLUCOSE 105 (H) 04/08/2022 0845   GLUCOSE 115 (H) 01/08/2019 0055   BUN 10 04/08/2022 0845   CREATININE 1.17 04/08/2022 0845   CREATININE 1.18 09/29/2016 1159   CALCIUM 9.9 04/08/2022 0845   GFRNONAA 71 08/05/2019 1220   GFRNONAA 73 09/29/2016 1159   GFRAA  82 08/05/2019 1220   GFRAA 84 09/29/2016 1159     Assessment & Plan:   Lipid screening - Lipid Panel  2. Diabetes mellitus screening  - Hemoglobin A1c  3. Essential hypertension  - CBC - Comprehensive metabolic panel  4. Prostate cancer screening  - PSA  Follow up:  Follow up in 6 months     Ivonne Andrew, NP 10/07/2022

## 2022-10-07 NOTE — Patient Instructions (Signed)
1. Lipid screening  - Lipid Panel  2. Diabetes mellitus screening  - Hemoglobin A1c  3. Essential hypertension  - CBC - Comprehensive metabolic panel  4. Prostate cancer screening  - PSA  Follow up:  Follow up in 6 months

## 2022-10-08 LAB — COMPREHENSIVE METABOLIC PANEL
ALT: 30 IU/L (ref 0–44)
AST: 31 IU/L (ref 0–40)
Albumin/Globulin Ratio: 1.8 (ref 1.2–2.2)
Albumin: 4.3 g/dL (ref 3.8–4.9)
Alkaline Phosphatase: 92 IU/L (ref 44–121)
BUN/Creatinine Ratio: 9 (ref 9–20)
BUN: 11 mg/dL (ref 6–24)
Bilirubin Total: 0.5 mg/dL (ref 0.0–1.2)
CO2: 22 mmol/L (ref 20–29)
Calcium: 9.4 mg/dL (ref 8.7–10.2)
Chloride: 101 mmol/L (ref 96–106)
Creatinine, Ser: 1.22 mg/dL (ref 0.76–1.27)
Globulin, Total: 2.4 g/dL (ref 1.5–4.5)
Glucose: 107 mg/dL — ABNORMAL HIGH (ref 70–99)
Potassium: 3.8 mmol/L (ref 3.5–5.2)
Sodium: 139 mmol/L (ref 134–144)
Total Protein: 6.7 g/dL (ref 6.0–8.5)
eGFR: 70 mL/min/{1.73_m2} (ref >59)

## 2022-10-08 LAB — LIPID PANEL
Chol/HDL Ratio: 4.4 ratio (ref 0.0–5.0)
Cholesterol, Total: 173 mg/dL (ref 100–199)
HDL: 39 mg/dL — ABNORMAL LOW (ref >39)
LDL Chol Calc (NIH): 120 mg/dL — ABNORMAL HIGH (ref 0–99)
Triglycerides: 73 mg/dL (ref 0–149)
VLDL Cholesterol Cal: 14 mg/dL (ref 5–40)

## 2022-10-08 LAB — CBC
Hematocrit: 38.4 % (ref 37.5–51.0)
Hemoglobin: 12.8 g/dL — ABNORMAL LOW (ref 13.0–17.7)
MCH: 28.5 pg (ref 26.6–33.0)
MCHC: 33.3 g/dL (ref 31.5–35.7)
MCV: 86 fL (ref 79–97)
Platelets: 278 10*3/uL (ref 150–450)
RBC: 4.49 x10E6/uL (ref 4.14–5.80)
RDW: 12.6 % (ref 11.6–15.4)
WBC: 5.4 10*3/uL (ref 3.4–10.8)

## 2022-10-08 LAB — HEMOGLOBIN A1C
Est. average glucose Bld gHb Est-mCnc: 128 mg/dL
Hgb A1c MFr Bld: 6.1 % — ABNORMAL HIGH (ref 4.8–5.6)

## 2022-10-08 LAB — PSA: Prostate Specific Ag, Serum: 0.4 ng/mL (ref 0.0–4.0)

## 2022-10-12 ENCOUNTER — Telehealth: Payer: Self-pay

## 2022-10-12 NOTE — Telephone Encounter (Signed)
I have spoken to the patient several times regarding his incorrect date of birth on his insurance card. He is supposed to be getting them to fix it. I cannot request prior authorization for anything ordered because the dob we have does not match the dob the insurance company has. I spoke to the patient 09/28/22, 10/03/22 and 10/06/22.Marland Kitchen

## 2022-11-03 ENCOUNTER — Other Ambulatory Visit: Payer: Self-pay | Admitting: Pain Medicine

## 2022-11-03 DIAGNOSIS — M4802 Spinal stenosis, cervical region: Secondary | ICD-10-CM

## 2022-11-03 DIAGNOSIS — M5412 Radiculopathy, cervical region: Secondary | ICD-10-CM

## 2022-11-03 DIAGNOSIS — G4486 Cervicogenic headache: Secondary | ICD-10-CM

## 2022-11-03 DIAGNOSIS — M47812 Spondylosis without myelopathy or radiculopathy, cervical region: Secondary | ICD-10-CM

## 2022-11-03 DIAGNOSIS — M50121 Cervical disc disorder at C4-C5 level with radiculopathy: Secondary | ICD-10-CM

## 2022-11-03 DIAGNOSIS — M542 Cervicalgia: Secondary | ICD-10-CM

## 2022-11-03 NOTE — Progress Notes (Signed)
Old C-spine MRI order expired.

## 2022-11-16 ENCOUNTER — Telehealth: Payer: Self-pay

## 2022-11-16 NOTE — Telephone Encounter (Signed)
I scheduled him for the SSNB on 11/29/22, it did not require authorization. He is having the cervical MRI done on 11/22/22, so I thought on 7/9 you can see MRI and maybe put all the notes in that you need to in order to try to get the CESI approved.

## 2022-11-16 NOTE — Telephone Encounter (Signed)
Insurance Treatment Denial Note  Date order was entered:  Order entered by: Delano Metz, MD Requested treatment: CESI Reason for denial: Documentation of pertinent physical exam is missing. Recommended for approval:  see below   This is what the denial said:  Your doctor told us that you have nerve pain coming from your neck. A shot to treat this  was requested. We cannot approve this request. The notes sent to [place] do not show: There is confirmed pain, numbness, tingling and/or weakness that travels down the arm(s)  or leg(s) that limits daily activity. There is pain that travels to the arm(s), chest, and/or leg(s) in a pattern related to the level  of the spine to be treated. A doctor exam that shows pain, numbness, tingling, and/or weakness from the spine to the  arm(s) and/or legs in a pattern related to the level of the spine to be treated. A four week trial of doctor ordered treatment that has not helped. You will take part in a program to manage pain. This must include: therapy; medicine;  information to help you understand your condition; and mental and social support. An imaging study (computed tomography (CT) or magnetic resonance imaging (MRI))  within the last 24 months done before the first injection. We told your doctor about this. Please talk to your doctor if you have questions. This decision was based on [employer] Clinical Policy Bulletin (CPB) Number 0016: Back PainInvasive Procedures

## 2022-11-22 ENCOUNTER — Ambulatory Visit
Admission: RE | Admit: 2022-11-22 | Discharge: 2022-11-22 | Disposition: A | Discharge status: Home / Self Care | Payer: 59 | Source: Ambulatory Visit | Attending: Pain Medicine | Admitting: Pain Medicine

## 2022-11-22 ENCOUNTER — Telehealth: Payer: Self-pay | Admitting: Pain Medicine

## 2022-11-22 DIAGNOSIS — M5412 Radiculopathy, cervical region: Secondary | ICD-10-CM

## 2022-11-22 DIAGNOSIS — M47812 Spondylosis without myelopathy or radiculopathy, cervical region: Secondary | ICD-10-CM

## 2022-11-22 DIAGNOSIS — M4802 Spinal stenosis, cervical region: Secondary | ICD-10-CM

## 2022-11-22 DIAGNOSIS — G4486 Cervicogenic headache: Secondary | ICD-10-CM

## 2022-11-22 DIAGNOSIS — M50121 Cervical disc disorder at C4-C5 level with radiculopathy: Secondary | ICD-10-CM

## 2022-11-22 DIAGNOSIS — M542 Cervicalgia: Secondary | ICD-10-CM

## 2022-11-22 NOTE — Progress Notes (Signed)
Pt needs CT head for MRI clearance, Pt's doctors office notified to order before continuing with MRI.

## 2022-11-22 NOTE — Telephone Encounter (Signed)
ERROR

## 2022-11-22 NOTE — Telephone Encounter (Signed)
Megan from scheduler call stated that patient was schedule for MRI today they notice patient had clips in his head, so MRI couldn't be perform. Patient will need an order place for an ct scan. PT didn't stated that he any metal devices in his body doing the questionnaire. So Dr. Laban Emperor will need to place that order to be done. TY

## 2022-11-23 ENCOUNTER — Other Ambulatory Visit: Payer: Self-pay | Admitting: Pain Medicine

## 2022-11-23 DIAGNOSIS — G4486 Cervicogenic headache: Secondary | ICD-10-CM

## 2022-11-23 DIAGNOSIS — G8929 Other chronic pain: Secondary | ICD-10-CM

## 2022-11-23 DIAGNOSIS — M5412 Radiculopathy, cervical region: Secondary | ICD-10-CM

## 2022-11-23 DIAGNOSIS — M4802 Spinal stenosis, cervical region: Secondary | ICD-10-CM

## 2022-11-23 DIAGNOSIS — M542 Cervicalgia: Secondary | ICD-10-CM

## 2022-11-23 DIAGNOSIS — M47812 Spondylosis without myelopathy or radiculopathy, cervical region: Secondary | ICD-10-CM

## 2022-11-23 DIAGNOSIS — M50121 Cervical disc disorder at C4-C5 level with radiculopathy: Secondary | ICD-10-CM

## 2022-11-23 DIAGNOSIS — M5030 Other cervical disc degeneration, unspecified cervical region: Secondary | ICD-10-CM

## 2022-11-23 NOTE — Progress Notes (Signed)
Radiology called indicating that the patient had some clips in the head and therefore they could not do the MRI.  They have requested an order for a CT scan.

## 2022-11-29 ENCOUNTER — Encounter: Payer: Self-pay | Admitting: Pain Medicine

## 2022-11-29 ENCOUNTER — Ambulatory Visit
Admission: RE | Admit: 2022-11-29 | Discharge: 2022-11-29 | Disposition: A | Discharge status: Home / Self Care | Payer: 59 | Source: Ambulatory Visit | Attending: Pain Medicine | Admitting: Pain Medicine

## 2022-11-29 ENCOUNTER — Ambulatory Visit: Payer: 59 | Attending: Pain Medicine | Admitting: Pain Medicine

## 2022-11-29 VITALS — BP 132/93 | HR 73 | Temp 97.0°F | Resp 16 | Ht 67.0 in | Wt 215.0 lb

## 2022-11-29 DIAGNOSIS — S46811S Strain of other muscles, fascia and tendons at shoulder and upper arm level, right arm, sequela: Secondary | ICD-10-CM | POA: Diagnosis not present

## 2022-11-29 DIAGNOSIS — S43431A Superior glenoid labrum lesion of right shoulder, initial encounter: Secondary | ICD-10-CM | POA: Diagnosis not present

## 2022-11-29 DIAGNOSIS — M75101 Unspecified rotator cuff tear or rupture of right shoulder, not specified as traumatic: Secondary | ICD-10-CM | POA: Insufficient documentation

## 2022-11-29 DIAGNOSIS — S46211S Strain of muscle, fascia and tendon of other parts of biceps, right arm, sequela: Secondary | ICD-10-CM | POA: Diagnosis not present

## 2022-11-29 DIAGNOSIS — M25511 Pain in right shoulder: Secondary | ICD-10-CM | POA: Insufficient documentation

## 2022-11-29 DIAGNOSIS — M67921 Unspecified disorder of synovium and tendon, right upper arm: Secondary | ICD-10-CM | POA: Insufficient documentation

## 2022-11-29 DIAGNOSIS — M67813 Other specified disorders of tendon, right shoulder: Secondary | ICD-10-CM | POA: Insufficient documentation

## 2022-11-29 DIAGNOSIS — G8929 Other chronic pain: Secondary | ICD-10-CM | POA: Diagnosis not present

## 2022-11-29 MED ORDER — MIDAZOLAM HCL 2 MG/2ML IJ SOLN
0.5000 mg | Freq: Once | INTRAMUSCULAR | Status: AC
  Administered 2022-11-29: 2 mg via INTRAVENOUS
  Filled 2022-11-29: qty 2

## 2022-11-29 MED ORDER — LIDOCAINE HCL 2 % IJ SOLN
20.0000 mL | Freq: Once | INTRAMUSCULAR | Status: AC
  Administered 2022-11-29: 100 mg
  Filled 2022-11-29: qty 40

## 2022-11-29 MED ORDER — TRIAMCINOLONE ACETONIDE 40 MG/ML IJ SUSP
40.0000 mg | Freq: Once | INTRAMUSCULAR | Status: AC
  Administered 2022-11-29: 40 mg
  Filled 2022-11-29: qty 1

## 2022-11-29 MED ORDER — ROPIVACAINE HCL 2 MG/ML IJ SOLN
4.0000 mL | Freq: Once | INTRAMUSCULAR | Status: AC
  Administered 2022-11-29: 4 mL via PERINEURAL
  Filled 2022-11-29: qty 20

## 2022-11-29 MED ORDER — LACTATED RINGERS IV SOLN: Freq: Once | INTRAVENOUS | Status: AC

## 2022-11-29 MED ORDER — PENTAFLUOROPROP-TETRAFLUOROETH EX AERO
INHALATION_SPRAY | Freq: Once | CUTANEOUS | Status: DC
  Filled 2022-11-29: qty 116

## 2022-11-29 NOTE — Progress Notes (Signed)
PROVIDER NOTE: Interpretation of information contained herein should be left to medically-trained personnel. Specific patient instructions are provided elsewhere under "Patient Instructions" section of medical record. This document was created in part using STT-dictation technology, any transcriptional errors that may result from this process are unintentional.  Patient: Michael Fleming Type: Established DOB: 1970/11/07 MRN: [id number] PCP: Ivonne Andrew, NP  Service: Procedure DOS: 11/29/2022 Setting: Ambulatory Location: Ambulatory outpatient facility Delivery: Face-to-face Provider: Oswaldo Done, MD Specialty: Interventional Pain Management Specialty designation: 09 Location: Outpatient facility Ref. Prov.: Ivonne Andrew, NP       Interventional Therapy   Procedure: Suprascapular nerve block (SSNB) #1  Laterality:  Right  Level: Superior to scapular spine, lateral to supraspinatus fossa (Suprascapular notch).  Imaging: Fluoroscopic guidance         Anesthesia: Local anesthesia (1-2% Lidocaine) Anxiolysis: IV Versed         Sedation: No Sedation                       DOS: 11/29/2022  Performed by: Oswaldo Done, MD  Purpose: Diagnostic/Therapeutic Indications: Shoulder pain, severe enough to impact quality of life and/or function. 1. Chronic shoulder pain (1ry area of Pain) (Right)   2. Degenerative superior 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) tear of shoulder (Right)   3. Nontraumatic tear of supraspinatus tendon (Right)   4. Partial tear of subscapularis tendon, sequela (Right)   5. Tendinopathy of biceps tendon (Right)   6. Tendinosis of rotator cuff (Right)   7. Traumatic rupture of biceps tendon, sequela (Right)    NAS-11 score:   Pre-procedure: 4 /10   Post-procedure: 6 /10     Target: Suprascapular nerve Location: midway between the medial border of the scapula and the acromion as it runs through the suprascapular notch. Region: Suprascapular,  posterior shoulder  Approach: Percutaneous  Neuroanatomy: The suprascapular nerve is the lateral branch of the superior trunk of the brachial plexus. It receives nerve fibers that originate in the nerve roots C5 and C6 (and sometimes C4). It is a mixed nerve, meaning that it provides both sensory and motor supply for the suprascapular region. Function: The main function of this nerve is to provide motor innervation for two muscles, the supraspinatus and infraspinatus muscles. They are part of the rotator cuff muscles. In addition, the suprascapular nerve provides a sensory supply to the joints of the scapula (glenohumeral and acromioclavicular joints). Rationale (medical necessity): procedure needed and proper for the diagnosis and/or treatment of the patient's medical symptoms and needs.  Position / Prep / Materials:  Position: Prone Materials:  Tray: Block Needle(s):  Type: Spinal  Gauge (G): 22  Length: 3.5 in.  Qty: 1 Prep solution: DuraPrep (Iodine Povacrylex [0.7% available iodine] and Isopropyl Alcohol, 74% w/w) Prep Area: Entire posterior shoulder area. From upper spine to shoulder proper (upper arm), and from lateral neck to lower tip of shoulder blade.   H&P (Pre-op Assessment):  Mr. Saffle is a 52 y.o. (year old), male patient, seen today for interventional treatment. He  has a past surgical history that includes Brain surgery; Foot surgery; Knee arthroscopy (Left); Wrist surgery (Left); and Shoulder arthroscopy with subacromial decompression and bicep tendon repair (Right, 03/31/2021). Mr. Hatchell has a current medication list which includes the following prescription(s): amlodipine, atorvastatin, losartan, and sildenafil, and the following Facility-Administered Medications: lactated ringers and pentafluoroprop-tetrafluoroeth. His primarily concern today is the Shoulder Pain (Bilateral, addressing the right today )  Initial Vital Signs:  Pulse/HCG  Rate: 76ECG Heart Rate: 75 Temp: (!)  97.4 F (36.3 C) Resp: 16 BP: 126/84 SpO2: 98 %  BMI: Estimated body mass index is 33.67 kg/m as calculated from the following:   Height as of this encounter: 5\' 7"  (1.702 m).   Weight as of this encounter: 215 lb (97.5 kg).  Risk Assessment: Allergies: Reviewed. He has No Known Allergies.  Allergy Precautions: None required Coagulopathies: Reviewed. None identified.  Blood-thinner therapy: None at this time Active Infection(s): Reviewed. None identified. Mr. Panton is afebrile  Site Confirmation: Mr. Oxborrow was asked to confirm the procedure and laterality before marking the site Procedure checklist: Completed Consent: Before the procedure and under the influence of no sedative(s), amnesic(s), or anxiolytics, the patient was informed of the treatment options, risks and possible complications. To fulfill our ethical and legal obligations, as recommended by the American Medical Association's Code of Ethics, I have informed the patient of my clinical impression; the nature and purpose of the treatment or procedure; the risks, benefits, and possible complications of the intervention; the alternatives, including doing nothing; the risk(s) and benefit(s) of the alternative treatment(s) or procedure(s); and the risk(s) and benefit(s) of doing nothing. The patient was provided information about the general risks and possible complications associated with the procedure. These may include, but are not limited to: failure to achieve desired goals, infection, bleeding, organ or nerve damage, allergic reactions, paralysis, and death. In addition, the patient was informed of those risks and complications associated to the procedure, such as failure to decrease pain; infection; bleeding; organ or nerve damage with subsequent damage to sensory, motor, and/or autonomic systems, resulting in permanent pain, numbness, and/or weakness of one or several areas of the body; allergic reactions; (i.e.: anaphylactic  reaction); and/or death. Furthermore, the patient was informed of those risks and complications associated with the medications. These include, but are not limited to: allergic reactions (i.e.: anaphylactic or anaphylactoid reaction(s)); adrenal axis suppression; blood sugar elevation that in diabetics may result in ketoacidosis or comma; water retention that in patients with history of congestive heart failure may result in shortness of breath, pulmonary edema, and decompensation with resultant heart failure; weight gain; swelling or edema; medication-induced neural toxicity; particulate matter embolism and blood vessel occlusion with resultant organ, and/or nervous system infarction; and/or aseptic necrosis of one or more joints. Finally, the patient was informed that Medicine is not an exact science; therefore, there is also the possibility of unforeseen or unpredictable risks and/or possible complications that may result in a catastrophic outcome. The patient indicated having understood very clearly. We have given the patient no guarantees and we have made no promises. Enough time was given to the patient to ask questions, all of which were answered to the patient's satisfaction. Mr. Rhodd has indicated that he wanted to continue with the procedure. Attestation: I, the ordering provider, attest that I have discussed with the patient the benefits, risks, side-effects, alternatives, likelihood of achieving goals, and potential problems during recovery for the procedure that I have provided informed consent. Date  Time: 11/29/2022 11:06 AM  Pre-Procedure Preparation:  Monitoring: As per clinic protocol. Respiration, ETCO2, SpO2, BP, heart rate and rhythm monitor placed and checked for adequate function Safety Precautions: Patient was assessed for positional comfort and pressure points before starting the procedure. Time-out: I initiated and conducted the "Time-out" before starting the procedure, as per  protocol. The patient was asked to participate by confirming the accuracy of the "Time Out" information. Verification of the correct person,  site, and procedure were performed and confirmed by me, the nursing staff, and the patient. "Time-out" conducted as per Joint Commission's Universal Protocol (UP.01.01.01). Time: 1149 Start Time: 1149 hrs.  Description of Procedure:          Procedural Technique Safety Precautions: Aspiration looking for blood return was conducted prior to all injections. At no point did we inject any substances, as a needle was being advanced. No attempts were made at seeking any paresthesias. Safe injection practices and needle disposal techniques used. Medications properly checked for expiration dates. SDV (single dose vial) medications used. Description of the Procedure: Protocol guidelines were followed. The patient was placed in position over the procedure table. The target area was identified and the area prepped in the usual manner. Skin & deeper tissues infiltrated with local anesthetic. Appropriate amount of time allowed to pass for local anesthetics to take effect. The procedure needles were then advanced to the target area. Proper needle placement secured. Negative aspiration confirmed. Solution injected in intermittent fashion, asking for systemic symptoms every 0.5cc of injectate. The needles were then removed and the area cleansed, making sure to leave some of the prepping solution back to take advantage of its long term bactericidal properties.  Vitals:   11/29/22 1100 11/29/22 1149 11/29/22 1154 11/29/22 1204  BP: 126/84 134/85 128/79 (!) 132/93  Pulse: 76   73  Resp: 16 17 18 16   Temp: (!) 97.4 F (36.3 C)   (!) 97 F (36.1 C)  TempSrc: Temporal     SpO2: 98% 96% 95% 97%  Weight: 215 lb (97.5 kg)     Height: 5\' 7"  (1.702 m)        Start Time: 1149 hrs. End Time: 1154 hrs.  Imaging Guidance (Spinal):          Type of Imaging Technique: Fluoroscopy  Guidance (Spinal) Indication(s): Assistance in needle guidance and placement for procedures requiring needle placement in or near specific anatomical locations not easily accessible without such assistance. Exposure Time: Please see nurses notes. Contrast: None used. Fluoroscopic Guidance: I was personally present during the use of fluoroscopy. "Tunnel Vision Technique" used to obtain the best possible view of the target area. Parallax error corrected before commencing the procedure. "Direction-depth-direction" technique used to introduce the needle under continuous pulsed fluoroscopy. Once target was reached, antero-posterior, oblique, and lateral fluoroscopic projection used confirm needle placement in all planes. Images permanently stored in EMR. Interpretation: No contrast injected. I personally interpreted the imaging intraoperatively. Adequate needle placement confirmed in multiple planes. Permanent images saved into the patient's record.  Antibiotic Prophylaxis:   Anti-infectives (From admission, onward)    None      Indication(s): None identified  Post-operative Assessment:  Post-procedure Vital Signs:  Pulse/HCG Rate: 7375 Temp: (!) 97 F (36.1 C) Resp: 16 BP: (!) 132/93 SpO2: 97 %  EBL: None  Complications: No immediate post-treatment complications observed by team, or reported by patient.  Note: The patient tolerated the entire procedure well. A repeat set of vitals were taken after the procedure and the patient was kept under observation following institutional policy, for this type of procedure. Post-procedural neurological assessment was performed, showing return to baseline, prior to discharge. The patient was provided with post-procedure discharge instructions, including a section on how to identify potential problems. Should any problems arise concerning this procedure, the patient was given instructions to immediately contact us, at any time, without hesitation. In any  case, we plan to contact the patient by telephone for a  follow-up status report regarding this interventional procedure.  Comments:  No additional relevant information.  Plan of Care (POC)  Orders:  Orders Placed This Encounter  Procedures   SUPRASCAPULAR NERVE BLOCK    For shoulder pain.    Scheduling Instructions:     Purpose: Diagnostic     Laterality: Right-sided     Level(s): Suprascapular notch     Sedation: Patient's choice     Scheduling Timeframe: Today    Order Specific Question:   Where will this procedure be performed?    Answer:   [facility]   DG PAIN [facility] C-ARM 1-60 MIN NO REPORT    Intraoperative interpretation by procedural physician at [facility].    Standing Status:   Standing    Number of Occurrences:   1    Order Specific Question:   Reason for exam:    Answer:   Assistance in needle guidance and placement for procedures requiring needle placement in or near specific anatomical locations not easily accessible without such assistance.   Informed Consent Details: Physician/Practitioner Attestation; Transcribe to consent form and obtain patient signature    Note: Always confirm laterality of pain with Mr. Chickering, before procedure.    Order Specific Question:   Physician/Practitioner attestation of informed consent for procedure/surgical case    Answer:   I, the physician/practitioner, attest that I have discussed with the patient the benefits, risks, side effects, alternatives, likelihood of achieving goals and potential problems during recovery for the procedure that I have provided informed consent.    Order Specific Question:   Procedure    Answer:   Suprascapular Nerve Block    Order Specific Question:   Physician/Practitioner performing the procedure    Answer:   Danique Hartsough A. Laban Emperor, MD    Order Specific Question:   Indication/Reason    Answer:   Chronic shoulder pain (arthralgia)   Provide equipment / supplies at bedside    Procedure  tray: "Block Tray" (Disposable  single use) Skin infiltration needle: Regular 1.5-in, 25-G, (x1) Block Needle type: Spinal Amount/quantity: 1 Size: Regular (3.5-inch) Gauge: 22G    Standing Status:   Standing    Number of Occurrences:   1    Order Specific Question:   Specify    Answer:   Block Tray   Chronic Opioid Analgesic:  No chronic opioid analgesics therapy prescribed by our practice. None MME/day: 0 mg/day   Medications ordered for procedure: Meds ordered this encounter  Medications   lidocaine (XYLOCAINE) 2 % (with pres) injection 400 mg   pentafluoroprop-tetrafluoroeth (GEBAUERS) aerosol   lactated ringers infusion   ropivacaine (PF) 2 mg/mL (0.2%) (NAROPIN) injection 4 mL   triamcinolone acetonide (KENALOG-40) injection 40 mg   midazolam (VERSED) injection 0.5-2 mg    Make sure Flumazenil is available in the pyxis when using this medication. If oversedation occurs, administer 0.2 mg IV over 15 sec. If after 45 sec no response, administer 0.2 mg again over 1 min; may repeat at 1 min intervals; not to exceed 4 doses (1 mg)   Medications administered: We administered lidocaine, lactated ringers, ropivacaine (PF) 2 mg/mL (0.2%), triamcinolone acetonide, and midazolam.  See the medical record for exact dosing, route, and time of administration.  Follow-up plan:   Return in about 2 weeks (around 12/13/2022) for Proc-day (T,Th), (Face2F), (PPE).       Interventional Therapies  Risk  Complexity Considerations:   Estimated body mass index is 33.36 kg/m as calculated from the following:  Height as of this encounter: 5\' 7"  (1.702 m).   Weight as of this encounter: 213 lb (96.6 kg). WNL   Planned  Pending:   Diagnostic/therapeutic right suprascapular NB #1    Under consideration:   Diagnostic/therapeutic right suprascapular NB #1  Possible therapeutic right suprascapular nerve RFA  Diagnostic/therapeutic right IA shoulder joint inj. #1    Completed:   None at this  time   Completed by other providers:   Therapeutic right shoulder arthroscopy, extensive debridement, subacromial decompression, open biceps tenodesis (03/31/2021) by Dr. Gershon Mussel [facility])  Diagnostic/therapeutic right subacromial bursa inj. x1 (08/10/2021) by Dr. Gershon Mussel [facility])    Therapeutic  Palliative (PRN) options:   None established        Recent Visits Date Type Provider Dept  09/28/22 Office Visit Delano Metz, MD [facility]  Showing recent visits within past 90 days and meeting all other requirements Today's Visits Date Type Provider Dept  11/29/22 Procedure visit Delano Metz, MD [facility]  Showing today's visits and meeting all other requirements Future Appointments Date Type Provider Dept  12/15/22 Appointment Delano Metz, MD [facility]  Showing future appointments within next 90 days and meeting all other requirements  Disposition: Discharge home  Discharge (Date  Time): 11/29/2022;   hrs.   Primary Care Physician: Ivonne Andrew, NP Location: [facility] Note by: Oswaldo Done, MD (TTS technology used. I apologize for any typographical errors that were not detected and corrected.) Date: 11/29/2022; Time: 12:16 PM  Disclaimer:  Medicine is not an Visual merchandiser. The only guarantee in medicine is that nothing is guaranteed. It is important to note that the decision to proceed with this intervention was based on the information collected from the patient. The Data and conclusions were drawn from the patient's questionnaire, the interview, and the physical examination. Because the information was provided in large part by the patient, it cannot be guaranteed that it has not been purposely or unconsciously manipulated. Every effort has been made to obtain as much relevant data as possible for this evaluation. It is important to note that the conclusions that lead to this  procedure are derived in large part from the available data. Always take into account that the treatment will also be dependent on availability of resources and existing treatment guidelines, considered by other Pain Management Practitioners as being common knowledge and practice, at the time of the intervention. For Medico-Legal purposes, it is also important to point out that variation in procedural techniques and pharmacological choices are the acceptable norm. The indications, contraindications, technique, and results of the above procedure should only be interpreted and judged by a Board-Certified Interventional Pain Specialist with extensive familiarity and expertise in the same exact procedure and technique.

## 2022-11-29 NOTE — Patient Instructions (Signed)

## 2022-11-30 ENCOUNTER — Telehealth: Payer: Self-pay | Admitting: *Deleted

## 2022-11-30 NOTE — Telephone Encounter (Signed)
Post procedure call;  patient reports that he is doing okay.  Appts reviewed for upcoming CT Scan as well as f/up for PPE.  Patient verbalizes u/o information.

## 2022-11-30 NOTE — Telephone Encounter (Signed)
Attempted to call for post procedure follow-up. Message left. 

## 2022-12-06 ENCOUNTER — Ambulatory Visit
Admission: RE | Admit: 2022-12-06 | Discharge: 2022-12-06 | Disposition: A | Discharge status: Home / Self Care | Payer: 59 | Source: Ambulatory Visit | Attending: Pain Medicine | Admitting: Pain Medicine

## 2022-12-06 DIAGNOSIS — M47812 Spondylosis without myelopathy or radiculopathy, cervical region: Secondary | ICD-10-CM | POA: Insufficient documentation

## 2022-12-06 DIAGNOSIS — M5412 Radiculopathy, cervical region: Secondary | ICD-10-CM | POA: Insufficient documentation

## 2022-12-06 DIAGNOSIS — G4486 Cervicogenic headache: Secondary | ICD-10-CM | POA: Diagnosis not present

## 2022-12-06 DIAGNOSIS — M50121 Cervical disc disorder at C4-C5 level with radiculopathy: Secondary | ICD-10-CM | POA: Diagnosis not present

## 2022-12-06 DIAGNOSIS — M25511 Pain in right shoulder: Secondary | ICD-10-CM | POA: Diagnosis not present

## 2022-12-06 DIAGNOSIS — G8929 Other chronic pain: Secondary | ICD-10-CM | POA: Insufficient documentation

## 2022-12-06 DIAGNOSIS — M5030 Other cervical disc degeneration, unspecified cervical region: Secondary | ICD-10-CM | POA: Diagnosis not present

## 2022-12-06 DIAGNOSIS — M542 Cervicalgia: Secondary | ICD-10-CM | POA: Insufficient documentation

## 2022-12-06 DIAGNOSIS — M4803 Spinal stenosis, cervicothoracic region: Secondary | ICD-10-CM | POA: Diagnosis not present

## 2022-12-06 DIAGNOSIS — M4802 Spinal stenosis, cervical region: Secondary | ICD-10-CM | POA: Insufficient documentation

## 2022-12-13 ENCOUNTER — Other Ambulatory Visit: Payer: Self-pay

## 2022-12-14 NOTE — Progress Notes (Signed)
PROVIDER NOTE: Information contained herein reflects review and annotations entered in association with encounter. Interpretation of such information and data should be left to medically-trained personnel. Information provided to patient can be located elsewhere in the medical record under "Patient Instructions". Document created using STT-dictation technology, any transcriptional errors that may result from process are unintentional.    Patient: Michael Fleming  Service Category: E/M  Provider: Oswaldo Done, MD  DOB: 16-May-1971  DOS: 12/15/2022  Referring Provider: Ivonne Andrew, NP  MRN: [id number]  Specialty: Interventional Pain Management  PCP: Ivonne Andrew, NP  Type: Established Patient  Setting: Ambulatory outpatient    Location: Office  Delivery: Face-to-face     HPI  Michael Fleming, a 52 y.o. year old male, is here today because of his Chronic right shoulder pain [M25.511, G89.29]. Michael Fleming primary complain today is Shoulder Pain (left)  Pertinent problems: Michael Fleming has Pain in wrist (Right); Chronic pain of left knee; Back pain; Nontraumatic tear of supraspinatus tendon (Right); Tendinopathy of biceps tendon (Right); Degenerative superior 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) tear of shoulder (Right); Tendinosis of rotator cuff (Right); Arthritis of right acromioclavicular joint; Chronic pain syndrome; Chronic shoulder pain (1ry area of Pain) (Right); Partial tear of subscapularis tendon, sequela (Right); Traumatic rupture of biceps tendon, sequela (Right); Cervicogenic headache; DDD (degenerative disc disease), cervical; Cervical foraminal stenosis (Bilateral); Radicular pain of shoulder; Cervical facet hypertrophy; Cervical radiculopathy at C6; Cervical disc disorder at C4-C5 level with radiculopathy; Chronic left shoulder pain; and Chronic bilateral low back pain without sciatica on their pertinent problem list. Pain Assessment: Severity of Chronic pain is reported as a  10-Worst pain ever/10. Location: Shoulder Left/denies. Onset: More than a month ago. Quality: Aching. Timing: Constant. Modifying factor(s): rest. Vitals:  height is 5\' 7"  (1.702 m) and weight is 217 lb (98.4 kg). His temporal temperature is 97.9 F (36.6 C). His blood pressure is 122/90 (abnormal) and his pulse is 70. His respiration is 16 and oxygen saturation is 98%.  BMI: Estimated body mass index is 33.99 kg/m as calculated from the following:   Height as of this encounter: 5\' 7"  (1.702 m).   Weight as of this encounter: 217 lb (98.4 kg). Last encounter: 09/28/2022. Last procedure: 11/29/2022.  Reason for encounter: post-procedure evaluation and assessment.  The patient indicates having attained a 100% relief of the pain for the duration of the local anesthetic which then went down to an ongoing 50% improvement.  Today he indicates that he is beginning to experience pain in the contralateral left shoulder.  In addition, he also indicates having some low back pain.  He has indicated that he would like to to explore getting the left shoulder and the lower back treated with some of the interventional therapies offered.  At this point, since he did well with the diagnostic right suprascapular nerve block, we will proceed with radiofrequency ablation on the right side.  In addition, we have offered a left intra-articular shoulder joint injection which he indicates being interested in proceeding with.  Post-procedure evaluation   Procedure: Suprascapular nerve block (SSNB) #1  Laterality:  Right  Level: Superior to scapular spine, lateral to supraspinatus fossa (Suprascapular notch).  Imaging: Fluoroscopic guidance         Anesthesia: Local anesthesia (1-2% Lidocaine) Anxiolysis: IV Versed         Sedation: No Sedation  DOS: 11/29/2022  Performed by: Oswaldo Done, MD  Purpose: Diagnostic/Therapeutic Indications: Shoulder pain, severe enough to impact quality of life  and/or function. 1. Chronic shoulder pain (1ry area of Pain) (Right)   2. Degenerative superior 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) tear of shoulder (Right)   3. Nontraumatic tear of supraspinatus tendon (Right)   4. Partial tear of subscapularis tendon, sequela (Right)   5. Tendinopathy of biceps tendon (Right)   6. Tendinosis of rotator cuff (Right)   7. Traumatic rupture of biceps tendon, sequela (Right)    NAS-11 score:   Pre-procedure: 4 /10   Post-procedure: 6 /10      Effectiveness:  Initial hour after procedure: 100 %. Subsequent 4-6 hours post-procedure: 100 %. Analgesia past initial 6 hours: 50 %. Ongoing improvement:  Analgesic: The patient indicates having attained 100% relief of the pain for the duration of the local anesthetic followed by a decrease to an ongoing 50% improvement. Function: Somewhat improved ROM: Somewhat improved  Pharmacotherapy Assessment  Analgesic: No chronic opioid analgesics therapy prescribed by our practice. None MME/day: 0 mg/day   Monitoring: [place] PMP: PDMP reviewed during this encounter.       Pharmacotherapy: No side-effects or adverse reactions reported. Compliance: No problems identified. Effectiveness: Clinically acceptable.  No notes on file  No results found for: "CBDTHCR" No results found for: "D8THCCBX" No results found for: "D9THCCBX"  UDS:  Summary  Date Value Ref Range Status  02/16/2022 Note  Final    Comment:    ==================================================================== Compliance Drug Analysis, Ur ==================================================================== Test                             Result       Flag       Units    NO DRUGS DETECTED. ==================================================================== Test                      Result    Flag   Units      Ref Range   Creatinine              28               mg/dL       >=16 ==================================================================== Declared Medications:  The flagging and interpretation on this report are based on the  following declared medications.  Unexpected results may arise from  inaccuracies in the declared medications.   **Note: The testing scope of this panel does not include the  following reported medications:   Amlodipine (Norvasc)  Atorvastatin (Lipitor)  Losartan (Cozaar) ==================================================================== For clinical consultation, please call [phone]. ====================================================================       ROS  Constitutional: Denies any fever or chills Gastrointestinal: No reported hemesis, hematochezia, vomiting, or acute GI distress Musculoskeletal: Denies any acute onset joint swelling, redness, loss of ROM, or weakness Neurological: No reported episodes of acute onset apraxia, aphasia, dysarthria, agnosia, amnesia, paralysis, loss of coordination, or loss of consciousness  Medication Review  amLODipine, atorvastatin, losartan, and sildenafil  History Review  Allergy: Michael Fleming has No Known Allergies. Drug: Michael Fleming  reports no history of drug use. Alcohol:  reports current alcohol use. Tobacco:  reports that he has never smoked. He has never used smokeless tobacco. Social: Michael Fleming  reports that he has never smoked. He has never used smokeless tobacco. He reports current alcohol use. He reports that he does not use drugs. Medical:  has a past  medical history of Allergy, Biceps tendon tear, Heart palpitations (12/2018), Hyperlipidemia, Hypertension, Hypokalemia (12/2018), and Seizures (HCC). Surgical: Michael Fleming  has a past surgical history that includes Brain surgery; Foot surgery; Knee arthroscopy (Left); Wrist surgery (Left); and Shoulder arthroscopy with subacromial decompression and bicep tendon repair (Right, 03/31/2021). Family: family history  includes Healthy in his mother; Hypertension in his father.  Laboratory Chemistry Profile   Renal Lab Results  Component Value Date   BUN 11 10/07/2022   CREATININE 1.22 10/07/2022   BCR 9 10/07/2022   GFRAA 82 08/05/2019   GFRNONAA 71 08/05/2019    Hepatic Lab Results  Component Value Date   AST 31 10/07/2022   ALT 30 10/07/2022   ALBUMIN 4.3 10/07/2022   ALKPHOS 92 10/07/2022    Electrolytes Lab Results  Component Value Date   NA 139 10/07/2022   K 3.8 10/07/2022   CL 101 10/07/2022   CALCIUM 9.4 10/07/2022   MG 1.9 02/16/2022    Bone Lab Results  Component Value Date   VD25OH 24.4 (L) 08/05/2020   25OHVITD1 25 (L) 02/16/2022   25OHVITD2 2.0 02/16/2022   25OHVITD3 23 02/16/2022    Inflammation (CRP: Acute Phase) (ESR: Chronic Phase) Lab Results  Component Value Date   CRP 5 02/16/2022   ESRSEDRATE 17 02/16/2022         Note: Above Lab results reviewed.  Recent Imaging Review  DG Lumbar Spine Complete W/Bend CLINICAL DATA:  Chronic low back pain.  EXAM: LUMBAR SPINE - COMPLETE WITH BENDING VIEWS  COMPARISON:  February 10, 2013  FINDINGS: There is no evidence of lumbar spine fracture. Alignment is normal. Mild decreased intervertebral space at L5-S1. Minimal anterior spurring noted throughout lumbar spine.  IMPRESSION: Mild degenerative joint changes of lumbar spine.  Electronically Signed   By: Sherian Rein M.D.   On: 12/16/2022 10:30 Note: Reviewed        Physical Exam  General appearance: Well nourished, well developed, and well hydrated. In no apparent acute distress Mental status: Alert, oriented x 3 (person, place, & time)       Respiratory: No evidence of acute respiratory distress Eyes: PERLA Vitals: BP (!) 122/90   Pulse 70   Temp 97.9 F (36.6 C) (Temporal)   Resp 16   Ht 5\' 7"  (1.702 m)   Wt 217 lb (98.4 kg)   SpO2 98%   BMI 33.99 kg/m  BMI: Estimated body mass index is 33.99 kg/m as calculated from the following:    Height as of this encounter: 5\' 7"  (1.702 m).   Weight as of this encounter: 217 lb (98.4 kg). Ideal: Ideal body weight: 66.1 kg (145 lb 11.6 oz) Adjusted ideal body weight: 79 kg (174 lb 3.8 oz)  Assessment   Diagnosis Status  1. Chronic shoulder pain (1ry area of Pain) (Right)   2. Degenerative superior 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) tear of shoulder (Right)   3. Nontraumatic tear of supraspinatus tendon (Right)   4. Traumatic rupture of biceps tendon, sequela (Right)   5. Tendinosis of rotator cuff (Right)   6. Tendinopathy of biceps tendon (Right)   7. Radicular pain of shoulder   8. Partial tear of subscapularis tendon, sequela (Right)   9. Chronic left shoulder pain   10. Chronic bilateral low back pain without sciatica   11. Postop check    Controlled Controlled Controlled   Updated Problems: No problems updated.   Plan of Care  Problem-specific:  No problem-specific Assessment & Plan notes found  for this encounter.  Michael Fleming has a current medication list which includes the following long-term medication(s): amlodipine, atorvastatin, losartan, and sildenafil.  Pharmacotherapy (Medications Ordered): No orders of the defined types were placed in this encounter.  Orders:  Orders Placed This Encounter  Procedures   SHOULDER INJECTION    Standing Status:   Future    Standing Expiration Date:   03/17/2023    Scheduling Instructions:     Procedure: Intra-articular shoulder (Glenohumeral) joint and (AC) Acromioclavicular joint injection     Side: Left-sided     Level: Glenohumeral joint and (AC) Acromioclavicular joint     Sedation: Patient's choice.     Timeframe: As permitted by the schedule    Order Specific Question:   Where will this procedure be performed?    Answer:   [facility]   Radiofrequency Shoulder Joint    For shoulder pain.    Standing Status:   Future    Standing Expiration Date:   03/17/2023    Scheduling Instructions:      Procedure: Suprascapular Nerve Radiofrequency Ablation     Laterality: Right-sided     Level(s): Suprascapular nerve     Sedation: Patient's choice     Scheduling Timeframe: As soon as pre-approved    Order Specific Question:   Where will this procedure be performed?    Answer:   [facility]   DG Shoulder Left    Please make sure that the patient understands that this needs to be done as soon as possible. Never have the patient do the imaging "just before the next appointment". Inform patient that having the imaging done within the [facility] will expedite the availability of the results and will provide imaging availability to the requesting physician. In addition inform the patient that the imaging order has an expiration date and will not be renewed if not done within the active period.    Standing Status:   Future    Number of Occurrences:   1    Standing Expiration Date:   01/15/2023    Scheduling Instructions:     Imaging must be done as soon as possible. Inform patient that order will expire within 30 days and I will not renew it.    Order Specific Question:   Reason for Exam (SYMPTOM  OR DIAGNOSIS REQUIRED)    Answer:   Left shoulder pain    Order Specific Question:   Preferred imaging location?    Answer:   [facility]    Order Specific Question:   Call Results- Best Contact Number?    Answer:   [phone] [facility])    Order Specific Question:   Release to patient    Answer:   Immediate   DG Lumbar Spine Complete W/Bend    Patient presents with axial pain with possible radicular component. Please assist [place] in identifying specific level(s) and laterality of any additional findings such as: 1. Facet (Zygapophyseal) joint DJD (Hypertrophy, space narrowing, subchondral sclerosis, and/or osteophyte formation) 2. DDD and/or IVDD (Loss of disc height, desiccation, gas patterns, osteophytes, endplate sclerosis, or "Black disc disease") 3. Pars defects 4.  Spondylolisthesis, spondylosis, and/or spondyloarthropathies (include Degree/Grade of displacement in mm) (stability) 5. Vertebral body Fractures (acute/chronic) (state percentage of collapse) 6. Demineralization (osteopenia/osteoporotic) 7. Bone pathology 8. Foraminal narrowing  9. Surgical changes    Standing Status:   Future    Number of Occurrences:   1    Standing Expiration Date:  01/15/2023    Scheduling Instructions:     Please make sure that the patient understands that this needs to be done as soon as possible. Never have the patient do the imaging "just before the next appointment". Inform patient that having the imaging done within the [facility] will expedite the availability of the results and will provide      imaging availability to the requesting physician. In addition inform the patient that the imaging order has an expiration date and will not be renewed if not done within the active period.    Order Specific Question:   Reason for Exam (SYMPTOM  OR DIAGNOSIS REQUIRED)    Answer:   Low back pain    Order Specific Question:   Preferred imaging location?    Answer:   [facility]    Order Specific Question:   Call Results- Best Contact Number?    Answer:   [phone] [facility])    Order Specific Question:   Radiology Contrast Protocol - do NOT remove file path    Answer:   [email or web address]    Order Specific Question:   Release to patient    Answer:   Immediate   Nursing Instructions:    Please complete this patient's postprocedure evaluation.    Scheduling Instructions:     Please complete this patient's postprocedure evaluation.   Follow-up plan:   Return for (ECT): (R) SSN RFA #1 & (L) IA Shoulder Inj. #1.      Interventional Therapies  Risk  Complexity Considerations:   Estimated body mass index is 33.36 kg/m as calculated from the following:   Height as of this encounter: 5\' 7"  (1.702 m).   Weight  as of this encounter: 213 lb (96.6 kg). WNL   Planned  Pending:   Diagnostic/therapeutic right suprascapular NB #1    Under consideration:   Diagnostic/therapeutic right suprascapular NB #1  Possible therapeutic right suprascapular nerve RFA  Diagnostic/therapeutic right IA shoulder joint inj. #1    Completed:   None at this time   Completed by other providers:   Therapeutic right shoulder arthroscopy, extensive debridement, subacromial decompression, open biceps tenodesis (03/31/2021) by Dr. Gershon Mussel [facility])  Diagnostic/therapeutic right subacromial bursa inj. x1 (08/10/2021) by Dr. Gershon Mussel [facility])    Therapeutic  Palliative (PRN) options:   None established         Recent Visits Date Type Provider Dept  12/15/22 Office Visit Delano Metz, MD [facility]  11/29/22 Procedure visit Delano Metz, MD [facility]  09/28/22 Office Visit Delano Metz, MD [facility]  Showing recent visits within past 90 days and meeting all other requirements Future Appointments No visits were found meeting these conditions. Showing future appointments within next 90 days and meeting all other requirements  I discussed the assessment and treatment plan with the patient. The patient was provided an opportunity to ask questions and all were answered. The patient agreed with the plan and demonstrated an understanding of the instructions.  Patient advised to call back or seek an in-person evaluation if the symptoms or condition worsens.  Duration of encounter: 30 minutes.  Total time on encounter, as per AMA guidelines included both the face-to-face and non-face-to-face time personally spent by the physician and/or other qualified health care professional(s) on the day of the encounter (includes time in activities that require the physician or other qualified health care professional and does not include time in activities normally performed  by clinical staff). Physician's  time may include the following activities when performed: Preparing to see the patient (e.g., pre-charting review of records, searching for previously ordered imaging, lab work, and nerve conduction tests) Review of prior analgesic pharmacotherapies. Reviewing PMP Interpreting ordered tests (e.g., lab work, imaging, nerve conduction tests) Performing post-procedure evaluations, including interpretation of diagnostic procedures Obtaining and/or reviewing separately obtained history Performing a medically appropriate examination and/or evaluation Counseling and educating the patient/family/caregiver Ordering medications, tests, or procedures Referring and communicating with other health care professionals (when not separately reported) Documenting clinical information in the electronic or other health record Independently interpreting results (not separately reported) and communicating results to the patient/ family/caregiver Care coordination (not separately reported)  Note by: Oswaldo Done, MD Date: 12/15/2022; Time: 7:29 AM

## 2022-12-15 ENCOUNTER — Ambulatory Visit (HOSPITAL_BASED_OUTPATIENT_CLINIC_OR_DEPARTMENT_OTHER): Payer: 59 | Admitting: Pain Medicine

## 2022-12-15 ENCOUNTER — Ambulatory Visit
Admission: RE | Admit: 2022-12-15 | Discharge: 2022-12-15 | Disposition: A | Discharge status: Home / Self Care | Payer: 59 | Source: Ambulatory Visit | Attending: Pain Medicine | Admitting: Pain Medicine

## 2022-12-15 ENCOUNTER — Ambulatory Visit
Admission: RE | Admit: 2022-12-15 | Discharge: 2022-12-15 | Disposition: A | Discharge status: Home / Self Care | Payer: 59 | Attending: Pain Medicine | Admitting: Pain Medicine

## 2022-12-15 VITALS — BP 122/90 | HR 70 | Temp 97.9°F | Resp 16 | Ht 67.0 in | Wt 217.0 lb

## 2022-12-15 DIAGNOSIS — G8929 Other chronic pain: Secondary | ICD-10-CM | POA: Insufficient documentation

## 2022-12-15 DIAGNOSIS — S46211S Strain of muscle, fascia and tendon of other parts of biceps, right arm, sequela: Secondary | ICD-10-CM | POA: Insufficient documentation

## 2022-12-15 DIAGNOSIS — M25512 Pain in left shoulder: Secondary | ICD-10-CM | POA: Insufficient documentation

## 2022-12-15 DIAGNOSIS — M67921 Unspecified disorder of synovium and tendon, right upper arm: Secondary | ICD-10-CM | POA: Insufficient documentation

## 2022-12-15 DIAGNOSIS — M5412 Radiculopathy, cervical region: Secondary | ICD-10-CM | POA: Insufficient documentation

## 2022-12-15 DIAGNOSIS — M19012 Primary osteoarthritis, left shoulder: Secondary | ICD-10-CM | POA: Diagnosis not present

## 2022-12-15 DIAGNOSIS — M25511 Pain in right shoulder: Secondary | ICD-10-CM

## 2022-12-15 DIAGNOSIS — M67813 Other specified disorders of tendon, right shoulder: Secondary | ICD-10-CM | POA: Insufficient documentation

## 2022-12-15 DIAGNOSIS — S46811S Strain of other muscles, fascia and tendons at shoulder and upper arm level, right arm, sequela: Secondary | ICD-10-CM

## 2022-12-15 DIAGNOSIS — S43431A Superior glenoid labrum lesion of right shoulder, initial encounter: Secondary | ICD-10-CM

## 2022-12-15 DIAGNOSIS — M47816 Spondylosis without myelopathy or radiculopathy, lumbar region: Secondary | ICD-10-CM | POA: Diagnosis not present

## 2022-12-15 DIAGNOSIS — Z09 Encounter for follow-up examination after completed treatment for conditions other than malignant neoplasm: Secondary | ICD-10-CM | POA: Insufficient documentation

## 2022-12-15 DIAGNOSIS — M545 Low back pain, unspecified: Secondary | ICD-10-CM | POA: Insufficient documentation

## 2022-12-15 DIAGNOSIS — M75101 Unspecified rotator cuff tear or rupture of right shoulder, not specified as traumatic: Secondary | ICD-10-CM

## 2022-12-15 NOTE — Patient Instructions (Signed)

## 2022-12-16 ENCOUNTER — Other Ambulatory Visit: Payer: Self-pay

## 2023-01-03 ENCOUNTER — Encounter: Payer: Self-pay | Admitting: Pain Medicine

## 2023-01-03 ENCOUNTER — Ambulatory Visit: Payer: 59 | Attending: Pain Medicine | Admitting: Pain Medicine

## 2023-01-03 ENCOUNTER — Ambulatory Visit
Admission: RE | Admit: 2023-01-03 | Discharge: 2023-01-03 | Disposition: A | Discharge status: Home / Self Care | Payer: 59 | Source: Ambulatory Visit | Attending: Pain Medicine | Admitting: Pain Medicine

## 2023-01-03 VITALS — BP 131/89 | Resp 16 | Ht 67.0 in | Wt 216.0 lb

## 2023-01-03 DIAGNOSIS — M25512 Pain in left shoulder: Secondary | ICD-10-CM | POA: Insufficient documentation

## 2023-01-03 DIAGNOSIS — G8929 Other chronic pain: Secondary | ICD-10-CM | POA: Insufficient documentation

## 2023-01-03 DIAGNOSIS — G8918 Other acute postprocedural pain: Secondary | ICD-10-CM | POA: Diagnosis not present

## 2023-01-03 DIAGNOSIS — M67813 Other specified disorders of tendon, right shoulder: Secondary | ICD-10-CM | POA: Diagnosis not present

## 2023-01-03 DIAGNOSIS — M67921 Unspecified disorder of synovium and tendon, right upper arm: Secondary | ICD-10-CM | POA: Insufficient documentation

## 2023-01-03 DIAGNOSIS — M19011 Primary osteoarthritis, right shoulder: Secondary | ICD-10-CM | POA: Insufficient documentation

## 2023-01-03 DIAGNOSIS — M75101 Unspecified rotator cuff tear or rupture of right shoulder, not specified as traumatic: Secondary | ICD-10-CM | POA: Insufficient documentation

## 2023-01-03 DIAGNOSIS — M25511 Pain in right shoulder: Secondary | ICD-10-CM | POA: Diagnosis not present

## 2023-01-03 DIAGNOSIS — S46211S Strain of muscle, fascia and tendon of other parts of biceps, right arm, sequela: Secondary | ICD-10-CM | POA: Diagnosis not present

## 2023-01-03 DIAGNOSIS — S46811S Strain of other muscles, fascia and tendons at shoulder and upper arm level, right arm, sequela: Secondary | ICD-10-CM | POA: Diagnosis not present

## 2023-01-03 DIAGNOSIS — M19012 Primary osteoarthritis, left shoulder: Secondary | ICD-10-CM | POA: Insufficient documentation

## 2023-01-03 MED ORDER — FENTANYL CITRATE (PF) 100 MCG/2ML IJ SOLN
25.0000 ug | INTRAMUSCULAR | Status: DC | PRN
  Administered 2023-01-03: 50 ug via INTRAVENOUS

## 2023-01-03 MED ORDER — MIDAZOLAM HCL 5 MG/5ML IJ SOLN
INTRAMUSCULAR | Status: AC
  Filled 2023-01-03: qty 5

## 2023-01-03 MED ORDER — LACTATED RINGERS IV SOLN: Freq: Once | INTRAVENOUS | Status: AC

## 2023-01-03 MED ORDER — PENTAFLUOROPROP-TETRAFLUOROETH EX AERO
INHALATION_SPRAY | Freq: Once | CUTANEOUS | Status: AC
  Administered 2023-01-03: 30 via TOPICAL
  Filled 2023-01-03: qty 30

## 2023-01-03 MED ORDER — MIDAZOLAM HCL 5 MG/5ML IJ SOLN
0.5000 mg | Freq: Once | INTRAMUSCULAR | Status: AC
  Administered 2023-01-03: 2 mg via INTRAVENOUS

## 2023-01-03 MED ORDER — METHYLPREDNISOLONE ACETATE 80 MG/ML IJ SUSP
INTRAMUSCULAR | Status: AC
  Filled 2023-01-03: qty 2

## 2023-01-03 MED ORDER — ROPIVACAINE HCL 2 MG/ML IJ SOLN
INTRAMUSCULAR | Status: AC
  Filled 2023-01-03: qty 20

## 2023-01-03 MED ORDER — ROPIVACAINE HCL 2 MG/ML IJ SOLN
9.0000 mL | Freq: Once | INTRAMUSCULAR | Status: AC
  Administered 2023-01-03: 9 mL via INTRA_ARTICULAR

## 2023-01-03 MED ORDER — LIDOCAINE HCL 2 % IJ SOLN
INTRAMUSCULAR | Status: AC
  Filled 2023-01-03: qty 20

## 2023-01-03 MED ORDER — FENTANYL CITRATE (PF) 100 MCG/2ML IJ SOLN
INTRAMUSCULAR | Status: AC
  Filled 2023-01-03: qty 2

## 2023-01-03 MED ORDER — LIDOCAINE HCL 2 % IJ SOLN
20.0000 mL | Freq: Once | INTRAMUSCULAR | Status: AC
  Administered 2023-01-03: 400 mg

## 2023-01-03 MED ORDER — METHYLPREDNISOLONE ACETATE 80 MG/ML IJ SUSP
80.0000 mg | Freq: Once | INTRAMUSCULAR | Status: AC
  Administered 2023-01-03: 80 mg

## 2023-01-03 MED ORDER — HYDROCODONE-ACETAMINOPHEN 5-325 MG PO TABS
1.0000 | ORAL_TABLET | Freq: Three times a day (TID) | ORAL | 0 refills | Status: AC | PRN
Start: 2023-01-10 — End: 2023-01-17

## 2023-01-03 MED ORDER — METHYLPREDNISOLONE ACETATE 80 MG/ML IJ SUSP
80.0000 mg | Freq: Once | INTRAMUSCULAR | Status: AC
  Administered 2023-01-03: 80 mg via INTRA_ARTICULAR

## 2023-01-03 MED ORDER — HYDROCODONE-ACETAMINOPHEN 5-325 MG PO TABS
1.0000 | ORAL_TABLET | Freq: Three times a day (TID) | ORAL | 0 refills | Status: AC | PRN
Start: 2023-01-03 — End: 2023-01-10

## 2023-01-03 MED ORDER — ROPIVACAINE HCL 2 MG/ML IJ SOLN
9.0000 mL | Freq: Once | INTRAMUSCULAR | Status: AC
  Administered 2023-01-03: 9 mL via PERINEURAL

## 2023-01-03 NOTE — Progress Notes (Signed)
PROVIDER NOTE: Interpretation of information contained herein should be left to medically-trained personnel. Specific patient instructions are provided elsewhere under "Patient Instructions" section of [facility] record. This document was created in part using STT-dictation technology, any transcriptional errors that may result from this process are unintentional.  Patient: Michael Fleming Type: Established DOB: Oct 09, 1970 MRN: [id number] PCP: Ivonne Andrew, NP  Service: Procedure DOS: 01/03/2023 Setting: Ambulatory Location: Ambulatory outpatient facility Delivery: Face-to-face Provider: Oswaldo Done, MD Specialty: Interventional Pain Management Specialty designation: 09 Location: Outpatient facility Ref. Prov.: Ivonne Andrew, NP       Interventional Therapy   Procedure No.1: Suprascapular nerve Radiofrequency Ablation (RFA) #1  Laterality: Right  Level: Superior to scapular spine, lateral to supraspinatus fossa (Suprascapular notch).  Imaging: Fluoroscopic guidance         Anesthesia: Local anesthesia (1-2% Lidocaine) Anxiolysis: IV Versed         Sedation: Moderate Sedation Fentanyl         DOS: 01/03/2023  Performed by: Oswaldo Done, MD  Purpose: Therapeutic Indications: Shoulder pain severe enough to impact quality of life or function. Indications: 1. Chronic shoulder pain (1ry area of Pain) (Right)   2. Osteoarthritis of AC (acromioclavicular) joint (Right)   3. Arthralgia of acromioclavicular joint (Right)   4. Nontraumatic tear of supraspinatus tendon (Right)   5. Partial tear of subscapularis tendon, sequela (Right)   6. Tendinopathy of biceps tendon (Right)   7. Tendinosis of rotator cuff (Right)   8. Traumatic rupture of biceps tendon, sequela (Right)    Michael Fleming has been dealing with the above chronic pain for longer than three months and has either failed to respond, was unable to tolerate, or simply did not get enough benefit from other more  conservative therapies including, but not limited to: 1. Over-the-counter medications 2. Anti-inflammatory medications 3. Muscle relaxants 4. Membrane stabilizers 5. Opioids 6. Physical therapy and/or chiropractic manipulation 7. Modalities (Heat, ice, etc.) 8. Invasive techniques such as nerve blocks. Michael Fleming has attained more than 50% relief of the pain from a series of diagnostic injections conducted in separate occasions.  Procedure No.2: Glenohumeral and acromioclavicular joint Steroid Injection #1  Laterality: Left (-LT)  Level: Shoulder  Target: Glenohumeral and acromioclavicular joint Location: Intra-articular  Region: Entire Shoulder Area Approach: Anterior approach. Type of procedure: Percutaneous joint injection  Imaging: Fluoroscopy-guided                             DOS: 01/03/2023  Performed by: Oswaldo Done, MD  Purpose: Diagnostic/Therapeutic Indications: Shoulder pain severe enough to impact quality of life or function. Rationale (Fleming necessity): procedure needed and proper for the diagnosis and/or treatment of Michael Fleming. 1. Chronic shoulder pain (Left)   2. Osteoarthritis of AC (acromioclavicular) joint (Left)   3. Arthralgia of lacromioclavicular joint (Left)    Pain Score: Pre-procedure: 8 /10 Post-procedure: 0-No pain/10     Position / Prep / Materials for procedure No.1:  Position: Prone  Prep solution: DuraPrep (Iodine Povacrylex [0.7% available iodine] and Isopropyl Alcohol, 74% w/w) Prep Area: Entire shoulder area.  This includes the lateral and posterior aspect of the neck, lateral and posterior aspect of the upper third of the upper arm, from the axilla to the spine in the upper back, down to the lower border of the scapula. Materials:  Tray:  RFA (Radiofrequency) tray Needle(s):  Type: RFA (Teflon-coated radiofrequency ablation needles)  Gauge (G): 22  Length: Regular (10cm) Qty: 1  Position  Prep   Materials for procedure No.2:  Position: Supine Prep solution: DuraPrep (Iodine Povacrylex [0.7% available iodine] and Isopropyl Alcohol, 74% w/w) Prep Area: Entire shoulder Area Materials:  Tray: Block Needle(s):  Type: Spinal  Gauge (G): 22  Length: 3.5-in  Qty: 1   H&P (Pre-op Assessment):  Michael Fleming is a 52 y.o. (year old), male patient, seen today for interventional treatment. He  has a past surgical history that includes Brain surgery; Foot surgery; Knee arthroscopy (Left); Wrist surgery (Left); and Shoulder arthroscopy with subacromial decompression and bicep tendon repair (Right, 03/31/2021). Michael Fleming has a current medication list which includes the following prescription(s): amlodipine, atorvastatin, hydrocodone-acetaminophen, [START ON 01/10/2023] hydrocodone-acetaminophen, losartan, and sildenafil, and the following Facility-Administered Medications: fentanyl. His primarily concern today is the Back Pain (lower)  Initial Vital Signs:  Pulse/HCG Rate:  ECG Heart Rate: 70 (NSR) Temp:   Resp: 18 BP: (!) 140/96 SpO2: 99 %  BMI: Estimated body mass index is 33.83 kg/m as calculated from the following:   Height as of this encounter: 5\' 7"  (1.702 m).   Weight as of this encounter: 216 lb (98 kg).  Risk Assessment: Allergies: Reviewed. He has No Known Allergies.  Allergy Precautions: None required Coagulopathies: Reviewed. None identified.  Blood-thinner therapy: None at this time Active Infection(s): Reviewed. None identified. Michael Fleming is afebrile  Site Confirmation: Michael Fleming was asked to confirm the procedure and laterality before marking the site Procedure checklist: Completed Consent: Before the procedure and under the influence of no sedative(s), amnesic(s), or anxiolytics, the patient was informed of the treatment options, risks and possible complications. To fulfill our ethical and legal obligations, as recommended by the [employer] [facility] [employer]'s Code of  Ethics, I have informed the patient of my clinical impression; the nature and purpose of the treatment or procedure; the risks, benefits, and possible complications of the intervention; the alternatives, including doing nothing; the risk(s) and benefit(s) of the alternative treatment(s) or procedure(s); and the risk(s) and benefit(s) of doing nothing. The patient was provided information about the general risks and possible complications associated with the procedure. These may include, but are not limited to: failure to achieve desired goals, infection, bleeding, organ or nerve damage, allergic reactions, paralysis, and death. In addition, the patient was informed of those risks and complications associated to the procedure, such as failure to decrease pain; infection; bleeding; organ or nerve damage with subsequent damage to sensory, motor, and/or autonomic systems, resulting in permanent pain, numbness, and/or weakness of one or several areas of the body; allergic reactions; (i.e.: anaphylactic reaction); and/or death. Furthermore, the patient was informed of those risks and complications associated with the medications. These include, but are not limited to: allergic reactions (i.e.: anaphylactic or anaphylactoid reaction(s)); adrenal axis suppression; blood sugar elevation that in diabetics may result in ketoacidosis or comma; water retention that in patients with history of congestive heart failure may result in shortness of breath, pulmonary edema, and decompensation with resultant heart failure; weight gain; swelling or edema; medication-induced neural toxicity; particulate matter embolism and blood vessel occlusion with resultant organ, and/or nervous system infarction; and/or aseptic necrosis of one or more joints. Finally, the patient was informed that Medicine is not an exact science; therefore, there is also the possibility of unforeseen or unpredictable risks and/or possible complications that may  result in a catastrophic outcome. The patient indicated having understood very clearly. We have given the patient no guarantees  and we have made no promises. Enough time was given to the patient to ask questions, all of which were answered to the patient's satisfaction. Mr. Boven has indicated that he wanted to continue with the procedure. Attestation: I, the ordering provider, attest that I have discussed with the patient the benefits, risks, side-effects, alternatives, likelihood of achieving goals, and potential problems during recovery for the procedure that I have provided informed consent. Date  Time: 01/03/2023  8:34 AM  Pre-Procedure Preparation:  Monitoring: As per clinic protocol. Respiration, ETCO2, SpO2, BP, heart rate and rhythm monitor placed and checked for adequate function Safety Precautions: Patient was assessed for positional comfort and pressure points before starting the procedure. Time-out: I initiated and conducted the "Time-out" before starting the procedure, as per protocol. The patient was asked to participate by confirming the accuracy of the "Time Out" information. Verification of the correct person, site, and procedure were performed and confirmed by me, the nursing staff, and the patient. "Time-out" conducted as per Joint Commission's Universal Protocol (UP.01.01.01). Time: 0912 Start Time: 0912 hrs.  Description/Narrative of Procedure #1:  Target: Suprascapular nerve as it passes thru the lower portion of the suprascapular notch. Location: Suprascapular notch Region: Shoulder, suprascapular Approach: Percutaneous   Rationale (Fleming necessity): procedure needed and proper for the diagnosis and/or treatment of the patient's Fleming Fleming and Fleming. Procedural Technique Safety Precautions: Aspiration looking for blood return was conducted prior to all injections. At no point did we inject any substances, as a needle was being advanced. No attempts were made at  seeking any paresthesias. Safe injection practices and needle disposal techniques used. Medications properly checked for expiration dates. SDV (single dose vial) medications used. Description of the Procedure: Protocol guidelines were followed. The patient was placed in position over the procedure table. The target area was identified and the area prepped in the usual manner. The skin and muscle were infiltrated with local anesthetic. Appropriate amount of time allowed to pass for local anesthetics to take effect. Radiofrequency needles were introduced to the target area using fluoroscopic guidance. Using the NeuroTherm NT1100 Radiofrequency Generator, sensory stimulation using 50 Hz was used to locate & identify the nerve, making sure that the needle was positioned such that there was no sensory stimulation below 0.3 V or above 0.7 V. Stimulation using 2 Hz was used to evaluate the motor component. Care was taken not to lesion any nerves that demonstrated motor stimulation of the lower extremities at an output of less than 2.5 times that of the sensory threshold, or a maximum of 2.0 V. Once satisfactory placement of the needles was achieved, the numbing solution was slowly injected after negative aspiration. After waiting for at least 2 minutes, the ablation was performed at 80 degrees C for 60 seconds, using regular Radiofrequency settings. Once the procedure was completed, the needles were then removed and the area cleansed, making sure to leave some of the prepping solution back to take advantage of its long term bactericidal properties. Intra-operative Compliance: Compliant    Description  Narrative of Procedure for procedure No.2:          Rationale (Fleming necessity): procedure needed and proper for the diagnosis and/or treatment of the patient's Fleming Fleming and Fleming. Procedural Technique Safety Precautions: Aspiration looking for blood return was conducted prior to all injections. At no point  did we inject any substances, as a needle was being advanced. No attempts were made at seeking any paresthesias. Safe injection practices and needle disposal techniques used.  Medications properly checked for expiration dates. SDV (single dose vial) medications used. Description of the Procedure: Protocol guidelines were followed. The patient was placed in position over the procedure table. The target area was identified and the area prepped in the usual manner. Skin & deeper tissues infiltrated with local anesthetic. Appropriate amount of time allowed to pass for local anesthetics to take effect. The procedure needles were then advanced to the target area. Proper needle placement secured. Negative aspiration confirmed. Solution injected in intermittent fashion, asking for systemic Fleming every 0.5cc of injectate. The needles were then removed and the area cleansed, making sure to leave some of the prepping solution back to take advantage of its long term bactericidal properties.             Vitals:   01/03/23 0934 01/03/23 0944 01/03/23 0954 01/03/23 1004  BP: (!) 147/87 128/88 134/88 131/89  Resp: 17 11 16 16   SpO2: 100% 95% 100% 99%  Weight:      Height:        Start Time: 0912 hrs. End Time: 0934 hrs.  Materials & Medications:  Needle(s) Type: Teflon-coated, curved tip, Radiofrequency needle(s) Gauge: 22G Length: 10cm Medication(s): Please see orders for medications and dosing details.  Imaging Guidance (Non-Spinal) for procedure #1:  Type of Imaging Technique: Fluoroscopy Guidance (Non-Spinal) Indication(s): Assistance in needle guidance and placement for procedures requiring needle placement in or near specific anatomical locations not easily accessible without such assistance. Exposure Time: Please see nurses notes. Contrast: Before injecting any contrast, we confirmed that the patient did not have an allergy to iodine, shellfish, or radiological contrast. Once satisfactory  needle placement was completed at the desired level, radiological contrast was injected. Contrast injected under live fluoroscopy. No contrast complications. See chart for type and volume of contrast used. Fluoroscopic Guidance: I was personally present during the use of fluoroscopy. "Tunnel Vision Technique" used to obtain the best possible view of the target area. Parallax error corrected before commencing the procedure. "Direction-depth-direction" technique used to introduce the needle under continuous pulsed fluoroscopy. Once target was reached, antero-posterior, oblique, and lateral fluoroscopic projection used confirm needle placement in all planes. Images permanently stored in EMR. Interpretation: I personally interpreted the imaging intraoperatively. Adequate needle placement confirmed in multiple planes. Appropriate spread of contrast into desired area was observed. No evidence of afferent or efferent intravascular uptake. Permanent images saved into the patient's record.  Imaging Guidance (Non-Spinal) for procedure #2:  Type of Imaging Technique: Fluoroscopy Guidance (Non-Spinal) Indication(s): Assistance in needle guidance and placement for procedures requiring needle placement in or near specific anatomical locations not easily accessible without such assistance. Exposure Time: Please see nurses notes. Contrast: None used. Fluoroscopic Guidance: I was personally present during the use of fluoroscopy. "Tunnel Vision Technique" used to obtain the best possible view of the target area. Parallax error corrected before commencing the procedure. "Direction-depth-direction" technique used to introduce the needle under continuous pulsed fluoroscopy. Once target was reached, antero-posterior, oblique, and lateral fluoroscopic projection used confirm needle placement in all planes. Images permanently stored in EMR. Interpretation: No contrast injected. I personally interpreted the imaging  intraoperatively. Adequate needle placement confirmed in multiple planes. Permanent images saved into the patient's record.  Antibiotic Prophylaxis:   Anti-infectives (From admission, onward)    None      Indication(s): None identified  Post-operative Assessment:  Post-procedure Vital Signs:  Pulse/HCG Rate:  70 Temp:   Resp: 16 BP: 131/89 SpO2: 99 %  EBL: None  Complications: No  immediate post-treatment complications observed by team, or reported by patient.  Note: The patient tolerated the entire procedure well. A repeat set of vitals were taken after the procedure and the patient was kept under observation following institutional policy, for this type of procedure. Post-procedural neurological assessment was performed, showing return to baseline, prior to discharge. The patient was provided with post-procedure discharge instructions, including a section on how to identify potential problems. Should any problems arise concerning this procedure, the patient was given instructions to immediately contact us, at any time, without hesitation. In any case, we plan to contact the patient by telephone for a follow-up status report regarding this interventional procedure.  Comments:  No additional relevant information.  Plan of Care (POC)  Orders:  Orders Placed This Encounter  Procedures   SHOULDER INJECTION    Scheduling Instructions:     Procedure: Intra-articular shoulder (Glenohumeral) joint and (AC) Acromioclavicular joint injection     Side: Left-sided     Level: Glenohumeral joint and (AC) Acromioclavicular joint     Sedation: Patient's choice.     Timeframe: Today    Order Specific Question:   Where will this procedure be performed?    Answer:   [facility]   Radiofrequency Shoulder Joint    For shoulder pain.    Scheduling Instructions:     Procedure: Suprascapular Nerve Radiofrequency Ablation     Laterality: Right-sided     Level(s): Suprascapular nerve      Sedation: With Sedation.     Scheduling Timeframe: Today    Order Specific Question:   Where will this procedure be performed?    Answer:   [facility]   DG PAIN CLINIC C-ARM 1-60 MIN NO REPORT    Intraoperative interpretation by procedural physician at [facility].    Standing Status:   Standing    Number of Occurrences:   1    Order Specific Question:   Reason for exam:    Answer:   Assistance in needle guidance and placement for procedures requiring needle placement in or near specific anatomical locations not easily accessible without such assistance.   Informed Consent Details: Physician/Practitioner Attestation; Transcribe to consent form and obtain patient signature    Note: Always confirm laterality of pain with Mr. Whitesell, before procedure.    Order Specific Question:   Physician/Practitioner attestation of informed consent for procedure/surgical case    Answer:   I, the physician/practitioner, attest that I have discussed with the patient the benefits, risks, side effects, alternatives, likelihood of achieving goals and potential problems during recovery for the procedure that I have provided informed consent.    Order Specific Question:   Procedure    Answer:   Intra-articular shoulder joint injection under fluoroscopic guidance    Order Specific Question:   Physician/Practitioner performing the procedure    Answer:    A. Laban Emperor, MD    Order Specific Question:   Indication/Reason    Answer:   Chronic shoulder pain secondary to shoulder arthropathy   Provide equipment / supplies at bedside    Procedure tray: "Block Tray" (Disposable  single use) Skin infiltration needle: Regular 1.5-in, 25-G, (x1) Block Needle type: Spinal Amount/quantity: 1 Size: Regular (3.5-inch) Gauge: 22G    Standing Status:   Standing    Number of Occurrences:   1    Order Specific Question:   Specify    Answer:   Block Tray   Informed Consent Details:  Physician/Practitioner Attestation; Transcribe to consent  form and obtain patient signature    Note: Always confirm laterality of pain with Mr. Holleman, before procedure.    Order Specific Question:   Physician/Practitioner attestation of informed consent for procedure/surgical case    Answer:   I, the physician/practitioner, attest that I have discussed with the patient the benefits, risks, side effects, alternatives, likelihood of achieving goals and potential problems during recovery for the procedure that I have provided informed consent.    Order Specific Question:   Procedure    Answer:   Suprascapular Nerva Radiofrequency Ablation    Order Specific Question:   Physician/Practitioner performing the procedure    Answer:    A. Laban Emperor, MD    Order Specific Question:   Indication/Reason    Answer:   Chronic shoulder pain (arthralgia)   Provide equipment / supplies at bedside    Procedure tray: "Radiofrequency Tray" Additional material: Large hemostat (x1); Small hemostat (x1); Towels (x8); 4x4 sterile sponge pack (x1) Needle type: Teflon-coated Radiofrequency Needle (Disposable  single use) Size: Regular Quantity: 1    Standing Status:   Standing    Number of Occurrences:   1    Order Specific Question:   Specify    Answer:   Radiofrequency Tray   Chronic Opioid Analgesic:  No chronic opioid analgesics therapy prescribed by our practice. None MME/day: 0 mg/day   Medications ordered for procedure: Meds ordered this encounter  Medications   lidocaine (XYLOCAINE) 2 % (with pres) injection 400 mg   pentafluoroprop-tetrafluoroeth (GEBAUERS) aerosol   lactated ringers infusion   midazolam (VERSED) 5 MG/5ML injection 0.5-2 mg    Make sure Flumazenil is available in the pyxis when using this medication. If oversedation occurs, administer 0.2 mg IV over 15 sec. If after 45 sec no response, administer 0.2 mg again over 1 min; may repeat at 1 min intervals; not to exceed 4 doses (1  mg)   fentaNYL (SUBLIMAZE) injection 25-50 mcg    Make sure Narcan is available in the pyxis when using this medication. In the event of respiratory depression (RR< 8/min): Titrate NARCAN (naloxone) in increments of 0.1 to 0.2 mg IV at 2-3 minute intervals, until desired degree of reversal.   methylPREDNISolone acetate (DEPO-MEDROL) injection 80 mg   ropivacaine (PF) 2 mg/mL (0.2%) (NAROPIN) injection 9 mL   ropivacaine (PF) 2 mg/mL (0.2%) (NAROPIN) injection 9 mL   methylPREDNISolone acetate (DEPO-MEDROL) injection 80 mg   HYDROcodone-acetaminophen (NORCO/VICODIN) 5-325 MG tablet    Sig: Take 1 tablet by mouth every 8 (eight) hours as needed for up to 7 days for severe pain. Must last 7 days.    Dispense:  21 tablet    Refill:  0    For acute post-operative pain. Not to be refilled. Must last 7 days.   HYDROcodone-acetaminophen (NORCO/VICODIN) 5-325 MG tablet    Sig: Take 1 tablet by mouth every 8 (eight) hours as needed for up to 7 days for severe pain. Must last for 7 days.    Dispense:  21 tablet    Refill:  0    For acute post-operative pain. Not to be refilled. Must last 7 days.   Medications administered: We administered lidocaine, pentafluoroprop-tetrafluoroeth, lactated ringers, midazolam, fentaNYL, methylPREDNISolone acetate, ropivacaine (PF) 2 mg/mL (0.2%), ropivacaine (PF) 2 mg/mL (0.2%), and methylPREDNISolone acetate.  See the Fleming record for exact dosing, route, and time of administration.  Follow-up plan:   Return in about 2 weeks (around 01/17/2023) for (Face2F), (PPE).  Interventional Therapies  Risk  Complexity Considerations:   HTN  seizure disorder     Planned  Pending:   Therapeutic right suprascapular RFA #1 (01/03/2023)    Under consideration:   Diagnostic/therapeutic right suprascapular NB #1  Possible therapeutic right suprascapular nerve RFA  Diagnostic/therapeutic right IA shoulder joint inj. #1    Completed:   Diagnostic/therapeutic right  suprascapular NB x1 (11/29/2022) (100/100/50/50)    Completed by other providers:   Therapeutic right shoulder arthroscopy, extensive debridement, subacromial decompression, open biceps tenodesis (03/31/2021) by Dr. Gershon Mussel [facility])  Diagnostic/therapeutic right subacromial bursa inj. x1 (08/10/2021) by Dr. Gershon Mussel [facility])    Therapeutic  Palliative (PRN) options:   None established      Recent Visits Date Type Provider Dept  12/15/22 Office Visit Delano Metz, MD [facility]  11/29/22 Procedure visit Delano Metz, MD [facility]  Showing recent visits within past 90 days and meeting all other requirements Today's Visits Date Type Provider Dept  01/03/23 Procedure visit Delano Metz, MD [facility]  Showing today's visits and meeting all other requirements Future Appointments Date Type Provider Dept  01/19/23 Appointment Delano Metz, MD [facility] [facility]  Showing future appointments within next 90 days and meeting all other requirements  Disposition: Discharge home  Discharge (Date  Time): 01/03/2023; 1015 hrs.   Primary Care Physician: Ivonne Andrew, NP Location: [facility] Note by: Oswaldo Done, MD (TTS technology used. I apologize for any typographical errors that were not detected and corrected.) Date: 01/03/2023; Time: 10:10 AM  Disclaimer:  Medicine is not an Visual merchandiser. The only guarantee in medicine is that nothing is guaranteed. It is important to note that the decision to proceed with this intervention was based on the information collected from the patient. The Data and conclusions were drawn from the patient's questionnaire, the interview, and the physical examination. Because the information was provided in large part by the patient, it cannot be guaranteed that it has not been purposely or unconsciously manipulated. Every effort has been made to obtain as much  relevant data as possible for this evaluation. It is important to note that the conclusions that lead to this procedure are derived in large part from the available data. Always take into account that the treatment will also be dependent on availability of resources and existing treatment guidelines, considered by other Pain Management Practitioners as being common knowledge and practice, at the time of the intervention. For Medico-Legal purposes, it is also important to point out that variation in procedural techniques and pharmacological choices are the acceptable norm. The indications, contraindications, technique, and results of the above procedure should only be interpreted and judged by a Board-Certified Interventional Pain Specialist with extensive familiarity and expertise in the same exact procedure and technique.

## 2023-01-03 NOTE — Progress Notes (Signed)
Safety precautions to be maintained throughout the outpatient stay will include: orient to surroundings, keep bed in low position, maintain call bell within reach at all times, provide assistance with transfer out of bed and ambulation.  

## 2023-01-03 NOTE — Patient Instructions (Signed)

## 2023-01-04 ENCOUNTER — Telehealth: Payer: Self-pay

## 2023-01-04 ENCOUNTER — Telehealth: Payer: Self-pay | Admitting: Pain Medicine

## 2023-01-04 NOTE — Telephone Encounter (Signed)
Post procedure follow up.  LM 

## 2023-01-04 NOTE — Telephone Encounter (Signed)
PT states that he is still hurting and that pain didn't ease up at all. PT states that he was up all night dealing with the pain. Please give patient a call. TY

## 2023-01-04 NOTE — Telephone Encounter (Signed)
Spoke with patient and he states he did not get any relief yesterday after the procedure. Then he states that it is a throbbing pain but the original pain has gone away.  Instructed patient to put heat on it today and to give the steroids time to start working.  Instructed to call back for any further questions or concerns.

## 2023-01-19 ENCOUNTER — Ambulatory Visit (HOSPITAL_BASED_OUTPATIENT_CLINIC_OR_DEPARTMENT_OTHER): Payer: 59 | Admitting: Pain Medicine

## 2023-01-19 DIAGNOSIS — Z09 Encounter for follow-up examination after completed treatment for conditions other than malignant neoplasm: Secondary | ICD-10-CM

## 2023-01-19 DIAGNOSIS — Z91199 Patient's noncompliance with other medical treatment and regimen due to unspecified reason: Secondary | ICD-10-CM

## 2023-01-19 NOTE — Progress Notes (Signed)
(  01/19/2023) NO-SHOW to postprocedure evaluation.

## 2023-02-09 ENCOUNTER — Encounter: Payer: Self-pay | Admitting: Pain Medicine

## 2023-02-09 ENCOUNTER — Ambulatory Visit: Payer: 59 | Attending: Pain Medicine | Admitting: Pain Medicine

## 2023-02-09 VITALS — BP 132/85 | HR 81 | Temp 98.2°F | Resp 18 | Ht 67.0 in | Wt 217.0 lb

## 2023-02-09 DIAGNOSIS — M25511 Pain in right shoulder: Secondary | ICD-10-CM | POA: Insufficient documentation

## 2023-02-09 DIAGNOSIS — Z09 Encounter for follow-up examination after completed treatment for conditions other than malignant neoplasm: Secondary | ICD-10-CM | POA: Insufficient documentation

## 2023-02-09 DIAGNOSIS — G8929 Other chronic pain: Secondary | ICD-10-CM | POA: Insufficient documentation

## 2023-02-09 DIAGNOSIS — M25512 Pain in left shoulder: Secondary | ICD-10-CM | POA: Diagnosis not present

## 2023-02-09 NOTE — Progress Notes (Signed)
PROVIDER NOTE: Information contained herein reflects review and annotations entered in association with encounter. Interpretation of such information and data should be left to medically-trained personnel. Information provided to patient can be located elsewhere in the medical record under "Patient Instructions". Document created using STT-dictation technology, any transcriptional errors that may result from process are unintentional.    Patient: Michael Fleming  Service Category: E/M  Provider: Oswaldo Done, MD  DOB: 13-Jul-1970  DOS: 02/09/2023  Referring Provider: Ivonne Andrew, NP  MRN: [id number]  Specialty: Interventional Pain Management  PCP: Ivonne Andrew, NP  Type: Established Patient  Setting: Ambulatory outpatient    Location: Office  Delivery: Face-to-face     HPI  Michael Fleming, a 52 y.o. year old male, is here today because of his Chronic right shoulder pain [M25.511, G89.29]. Michael Fleming's primary complain today is Shoulder Pain (bilateral)  Pertinent problems: 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); Tendinosis of rotator cuff (Right); Osteoarthritis of AC (acromioclavicular) joint (Right); Chronic pain syndrome; Chronic shoulder pain (1ry area of Pain) (Right); Partial tear of subscapularis tendon, sequela (Right); Traumatic rupture of biceps tendon, sequela (Right); Cervicogenic headache; DDD (degenerative disc disease), cervical; Cervical foraminal stenosis (Bilateral); Radicular shoulder pain; Cervical facet hypertrophy; Cervical radiculopathy at C6; Cervical disc disorder at C4-C5 level with radiculopathy; Chronic shoulder pain (Left); Chronic low back pain (Bilateral) w/o sciatica; Arthralgia of acromioclavicular joint (Right); Osteoarthritis of AC (acromioclavicular) joint  (Left); and Arthralgia of lacromioclavicular joint (Left) on their pertinent problem list. Pain Assessment: Severity of Chronic pain is reported as a 1 /10. Location: Shoulder Right, Left/denies. Onset: More than a month ago. Quality: Aching, Throbbing. Timing: Intermittent. Modifying factor(s): injections. Vitals:  height is 5\' 7"  (1.702 m) and weight is 217 lb (98.4 kg). His temperature is 98.2 F (36.8 C). His blood pressure is 132/85 and his pulse is 81. His respiration is 18 and oxygen saturation is 98%.  BMI: Estimated body mass index is 33.99 kg/m as calculated from the following:   Height as of this encounter: 5\' 7"  (1.702 m).   Weight as of this encounter: 217 lb (98.4 kg). Last encounter: 01/19/2023. Last procedure: 01/03/2023.  Reason for encounter: post-procedure evaluation and assessment. The patient indicates having attained 100% relief of the pain for the duration of the local anesthetic followed by a flareup of his right shoulder pain for approximately 10 to 14 days followed by a decrease and then ongoing 75% improvement of his right shoulder pain.  In terms of the left shoulder he indicates having attained 100% relief of the pain for the duration of the local anesthetic and currently he is enjoying an ongoing 100% relief of his left shoulder pain.  Looking into what happened, we have realized that there was some apparent miscommunication and the patient was not aware that I had sent 2 prescriptions for Norco to the pharmacy to help him with the post radiofrequency pain which we are well aware that can be very uncomfortable.  He apparently never gave [place] a call and therefore failed to be informed that there was this medication in the pharmacy that he could have taken for the pain.  In terms of the radiofrequency ablation, he seems to have a current 75% improvement of his right shoulder pain from the radiofrequency even though we are just beginning week number  5+ radiofrequency.  He was  reminded that radiofrequency's may take up to 6 weeks for him to get the full benefit.  Hopefully this will continue to improve over the next 2 weeks to the point where he may not be having much pain at all in the right shoulder.  In terms of the left shoulder, he refers that that injection provided him with 100% relief of the pain that seems to be ongoing.  Today he indicates having absolutely no pain on the left shoulder.  Today's physical exam demonstrates the patient to be having improved range of motion of both shoulders with no muscle weakness from the radiofrequency ablation.  At this point I have recommended that he go to his pharmacy and pick up the prescriptions that we wrote for him just in case he still has some discomfort.  He was also encouraged to give [place] a call if his pain gets to the point where he again feels that there is something that needs to be done.  For now, he refers that he is doing fairly well and does not need anything else.  Post-procedure evaluation   Procedure No.1: Suprascapular nerve Radiofrequency Ablation (RFA) #1  Laterality: Right  Level: Superior to scapular spine, lateral to supraspinatus fossa (Suprascapular notch).  Imaging: Fluoroscopic guidance         Anesthesia: Local anesthesia (1-2% Lidocaine) Anxiolysis: IV Versed         Sedation: Moderate Sedation Fentanyl         DOS: 01/03/2023  Performed by: Oswaldo Done, MD Purpose: Therapeutic Indications: Shoulder pain severe enough to impact quality of life or function. Indications: 1. Chronic shoulder pain (1ry area of Pain) (Right)   2. Osteoarthritis of AC (acromioclavicular) joint (Right)   3. Arthralgia of acromioclavicular joint (Right)   4. Nontraumatic tear of supraspinatus tendon (Right)   5. Partial tear of subscapularis tendon, sequela (Right)   6. Tendinopathy of biceps tendon (Right)   7. Tendinosis of rotator cuff (Right)   8. Traumatic rupture of biceps tendon, sequela (Right)     Procedure No.2: Glenohumeral and acromioclavicular joint Steroid Injection #1  Laterality: Left (-LT)  Level: Shoulder  Target: Glenohumeral and acromioclavicular joint Location: Intra-articular  Region: Entire Shoulder Area Approach: Anterior approach. Type of procedure: Percutaneous joint injection  Imaging: Fluoroscopy-guided                             DOS: 01/03/2023  Performed by: Oswaldo Done, MD  Purpose: Diagnostic/Therapeutic Indications: Shoulder pain severe enough to impact quality of life or function. Rationale (medical necessity): procedure needed and proper for the diagnosis and/or treatment of Michael Fleming's medical symptoms and needs. 1. Chronic shoulder pain (Left)   2. Osteoarthritis of AC (acromioclavicular) joint (Left)   3. Arthralgia of lacromioclavicular joint (Left)    Pain Score: Pre-procedure: 8 /10 Post-procedure: 0-No pain/10     Effectiveness:  Initial hour after procedure: 100 %. Subsequent 4-6 hours post-procedure: 100 %. Analgesia past initial 6 hours: 0 % the patient indicates having had a flareup of his pain once the local anesthetic wore off. Ongoing improvement:  Analgesic: The patient indicates having attained 100% relief of the pain for the duration of the local anesthetic followed by a flareup of his right shoulder pain for approximately 10 to 14 days followed by a decrease and then ongoing 75% improvement of his right shoulder pain.  In terms of the left  shoulder he indicates having attained 100% relief of the pain for the duration of the local anesthetic and currently he is enjoying an ongoing 100% relief of his left shoulder pain. Function: Michael Fleming reports improvement in function ROM: Michael Fleming reports improvement in ROM  Pharmacotherapy Assessment  Analgesic: No chronic opioid analgesics therapy prescribed by our practice. None MME/day: 0 mg/day   Monitoring: [place] PMP: PDMP reviewed during this encounter.        Pharmacotherapy: No side-effects or adverse reactions reported. Compliance: No problems identified. Effectiveness: Clinically acceptable.  Valerie Salts, RN  02/09/2023  2:30 PM  Sign when Signing Visit Safety precautions to be maintained throughout the outpatient stay will include: orient to surroundings, keep bed in low position, maintain call bell within reach at all times, provide assistance with transfer out of bed and ambulation.    No results found for: "CBDTHCR" No results found for: "D8THCCBX" No results found for: "D9THCCBX"  UDS:  Summary  Date Value Ref Range Status  02/16/2022 Note  Final    Comment:    ==================================================================== Compliance Drug Analysis, Ur ==================================================================== Test                             Result       Flag       Units    NO DRUGS DETECTED. ==================================================================== Test                      Result    Flag   Units      Ref Range   Creatinine              28               mg/dL      >=16 ==================================================================== Declared Medications:  The flagging and interpretation on this report are based on the  following declared medications.  Unexpected results may arise from  inaccuracies in the declared medications.   **Note: The testing scope of this panel does not include the  following reported medications:   Amlodipine (Norvasc)  Atorvastatin (Lipitor)  Losartan (Cozaar) ==================================================================== For clinical consultation, please call [phone]. ====================================================================       ROS  Constitutional: Denies any fever or chills Gastrointestinal: No reported hemesis, hematochezia, vomiting, or acute GI distress Musculoskeletal: Denies any acute onset joint swelling, redness, loss of  ROM, or weakness Neurological: No reported episodes of acute onset apraxia, aphasia, dysarthria, agnosia, amnesia, paralysis, loss of coordination, or loss of consciousness  Medication Review  amLODipine, atorvastatin, losartan, and sildenafil  History Review  Allergy: Michael Fleming has No Known Allergies. Drug: Michael Fleming  reports no history of drug use. Alcohol:  reports current alcohol use. Tobacco:  reports that he has never smoked. He has never used smokeless tobacco. Social: Michael Fleming  reports that he has never smoked. He has never used smokeless tobacco. He reports current alcohol use. He reports that he does not use drugs. Medical:  has a past medical history of Allergy, Biceps tendon tear, Heart palpitations (12/2018), Hyperlipidemia, Hypertension, Hypokalemia (12/2018), and Seizures (HCC). Surgical: Michael Fleming  has a past surgical history that includes Brain surgery; Foot surgery; Knee arthroscopy (Left); Wrist surgery (Left); and Shoulder arthroscopy with subacromial decompression and bicep tendon repair (Right, 03/31/2021). Family: family history includes Healthy in his mother; Hypertension in his father.  Laboratory Chemistry Profile   Renal Lab Results  Component Value  Date   BUN 11 10/07/2022   CREATININE 1.22 10/07/2022   BCR 9 10/07/2022   GFRAA 82 08/05/2019   GFRNONAA 71 08/05/2019    Hepatic Lab Results  Component Value Date   AST 31 10/07/2022   ALT 30 10/07/2022   ALBUMIN 4.3 10/07/2022   ALKPHOS 92 10/07/2022    Electrolytes Lab Results  Component Value Date   NA 139 10/07/2022   K 3.8 10/07/2022   CL 101 10/07/2022   CALCIUM 9.4 10/07/2022   MG 1.9 02/16/2022    Bone Lab Results  Component Value Date   VD25OH 24.4 (L) 08/05/2020   25OHVITD1 25 (L) 02/16/2022   25OHVITD2 2.0 02/16/2022   25OHVITD3 23 02/16/2022    Inflammation (CRP: Acute Phase) (ESR: Chronic Phase) Lab Results  Component Value Date   CRP 5 02/16/2022   ESRSEDRATE 17  02/16/2022         Note: Above Lab results reviewed.  Recent Imaging Review  DG PAIN [facility]IN[facility] C-ARM 1-60 MIN NO REPORT Fluoro was used, but no Radiologist interpretation will be provided.  Please refer to "NOTES" tab for provider progress note. Note: Reviewed        Physical Exam  General appearance: Well nourished, well developed, and well hydrated. In no apparent acute distress Mental status: Alert, oriented x 3 (person, place, & time)       Respiratory: No evidence of acute respiratory distress Eyes: PERLA Vitals: BP 132/85 Comment: did not take blood pressure medication.  INdtructed to take as soon as he gets home. MD notified  Pulse 81   Temp 98.2 F (36.8 C)   Resp 18   Ht 5\' 7"  (1.702 m)   Wt 217 lb (98.4 kg)   SpO2 98%   BMI 33.99 kg/m  BMI: Estimated body mass index is 33.99 kg/m as calculated from the following:   Height as of this encounter: 5\' 7"  (1.702 m).   Weight as of this encounter: 217 lb (98.4 kg). Ideal: Ideal body weight: 66.1 kg (145 lb 11.6 oz) Adjusted ideal body weight: 79 kg (174 lb 3.8 oz)  Assessment   Diagnosis Status  1. Chronic shoulder pain (1ry area of Pain) (Right)   2. Arthralgia of acromioclavicular joint (Right)   3. Chronic shoulder pain (Left)   4. Postop check    Improved Improved Resolved   Updated Problems: No problems updated.  Plan of Care  Problem-specific:  No problem-specific Assessment & Plan notes found for this encounter.  Michael Fleming has a current medication list which includes the following long-term medication(s): amlodipine, losartan, sildenafil, and atorvastatin.  Pharmacotherapy (Medications Ordered): No orders of the defined types were placed in this encounter.  Orders:  Orders Placed This Encounter  Procedures   Nursing Instructions:    Please complete this patient's postprocedure evaluation.    Scheduling Instructions:     Please complete this patient's postprocedure evaluation.    Follow-up plan:   No follow-ups on file.      Interventional Therapies  Risk  Complexity Considerations:   HTN  seizure disorder     Planned  Pending:      Under consideration:      Completed:   Diagnostic/therapeutic left IA glenohumeral/AC joint inj. x1 (01/03/2023) (100/100/100/100) Therapeutic right suprascapular RFA x1 (01/03/2023) (100/100/0/75)  Diagnostic/therapeutic right suprascapular NB x1 (11/29/2022) (100/100/50/50)    Completed by other providers:   Therapeutic right shoulder arthroscopy, extensive debridement, subacromial decompression, open biceps tenodesis (03/31/2021) by Dr. Coralyn Mark  [facility])  Diagnostic/therapeutic right subacromial bursa inj. x1 (08/10/2021) by Dr. Gershon Mussel [facility])    Therapeutic  Palliative (PRN) options:   None established       Recent Visits Date Type Provider Dept  01/03/23 Procedure visit Delano Metz, MD [facility]  12/15/22 Office Visit Delano Metz, MD [facility]  11/29/22 Procedure visit Delano Metz, MD [facility]  Showing recent visits within past 90 days and meeting all other requirements Today's Visits Date Type Provider Dept  02/09/23 Office Visit Delano Metz, MD [facility]  Showing today's visits and meeting all other requirements Future Appointments No visits were found meeting these conditions. Showing future appointments within next 90 days and meeting all other requirements  I discussed the assessment and treatment plan with the patient. The patient was provided an opportunity to ask questions and all were answered. The patient agreed with the plan and demonstrated an understanding of the instructions.  Patient advised to call back or seek an in-person evaluation if the symptoms or condition worsens.  Duration of encounter: 30 minutes.  Total time on encounter, as per AMA guidelines included both the face-to-face and  non-face-to-face time personally spent by the physician and/or other qualified health care professional(s) on the day of the encounter (includes time in activities that require the physician or other qualified health care professional and does not include time in activities normally performed by clinical staff). Physician's time may include the following activities when performed: Preparing to see the patient (e.g., pre-charting review of records, searching for previously ordered imaging, lab work, and nerve conduction tests) Review of prior analgesic pharmacotherapies. Reviewing PMP Interpreting ordered tests (e.g., lab work, imaging, nerve conduction tests) Performing post-procedure evaluations, including interpretation of diagnostic procedures Obtaining and/or reviewing separately obtained history Performing a medically appropriate examination and/or evaluation Counseling and educating the patient/family/caregiver Ordering medications, tests, or procedures Referring and communicating with other health care professionals (when not separately reported) Documenting clinical information in the electronic or other health record Independently interpreting results (not separately reported) and communicating results to the patient/ family/caregiver Care coordination (not separately reported)  Note by: Oswaldo Done, MD Date: 02/09/2023; Time: 3:22 PM

## 2023-02-09 NOTE — Progress Notes (Signed)
Safety precautions to be maintained throughout the outpatient stay will include: orient to surroundings, keep bed in low position, maintain call bell within reach at all times, provide assistance with transfer out of bed and ambulation.  

## 2023-02-15 ENCOUNTER — Telehealth: Payer: Self-pay | Admitting: Pain Medicine

## 2023-02-15 NOTE — Telephone Encounter (Signed)
Called pharmacy, there is a Rx for Hydrocodone that may be filled. Patient notified.

## 2023-02-15 NOTE — Telephone Encounter (Signed)
Patient is calling about script Dr Laban Emperor told him he sent in. Patient's pharmacy says they did not get any script for pain meds.

## 2023-02-17 ENCOUNTER — Other Ambulatory Visit: Payer: Self-pay

## 2023-02-17 ENCOUNTER — Other Ambulatory Visit: Payer: Self-pay | Admitting: Nurse Practitioner

## 2023-02-17 DIAGNOSIS — E7849 Other hyperlipidemia: Secondary | ICD-10-CM

## 2023-02-17 MED ORDER — ATORVASTATIN CALCIUM 40 MG PO TABS
40.0000 mg | ORAL_TABLET | Freq: Every day | ORAL | 2 refills | Status: DC
Start: 2023-02-17 — End: 2023-04-10
  Filled 2023-02-17: qty 30, 30d supply, fill #0

## 2023-02-22 ENCOUNTER — Other Ambulatory Visit: Payer: Self-pay

## 2023-04-07 ENCOUNTER — Ambulatory Visit: Payer: Self-pay | Admitting: Nurse Practitioner

## 2023-04-10 ENCOUNTER — Encounter: Payer: Self-pay | Admitting: Nurse Practitioner

## 2023-04-10 ENCOUNTER — Other Ambulatory Visit: Payer: Self-pay

## 2023-04-10 ENCOUNTER — Ambulatory Visit (INDEPENDENT_AMBULATORY_CARE_PROVIDER_SITE_OTHER): Payer: 59 | Admitting: Nurse Practitioner

## 2023-04-10 VITALS — BP 133/82 | HR 78 | Ht 67.0 in | Wt 215.0 lb

## 2023-04-10 DIAGNOSIS — G8929 Other chronic pain: Secondary | ICD-10-CM

## 2023-04-10 DIAGNOSIS — E7849 Other hyperlipidemia: Secondary | ICD-10-CM

## 2023-04-10 DIAGNOSIS — M25512 Pain in left shoulder: Secondary | ICD-10-CM | POA: Diagnosis not present

## 2023-04-10 DIAGNOSIS — M25511 Pain in right shoulder: Secondary | ICD-10-CM

## 2023-04-10 DIAGNOSIS — I10 Essential (primary) hypertension: Secondary | ICD-10-CM

## 2023-04-10 DIAGNOSIS — Z760 Encounter for issue of repeat prescription: Secondary | ICD-10-CM

## 2023-04-10 DIAGNOSIS — N529 Male erectile dysfunction, unspecified: Secondary | ICD-10-CM

## 2023-04-10 MED ORDER — MELOXICAM 15 MG PO TABS
15.0000 mg | ORAL_TABLET | Freq: Every day | ORAL | 0 refills | Status: DC
  Filled 2023-04-10: qty 30, 30d supply, fill #0

## 2023-04-10 MED ORDER — ATORVASTATIN CALCIUM 40 MG PO TABS
40.0000 mg | ORAL_TABLET | Freq: Every day | ORAL | 1 refills | Status: DC
Start: 2023-04-10 — End: 2023-07-21
  Filled 2023-04-10: qty 30, 30d supply, fill #0
  Filled 2023-06-14: qty 30, 30d supply, fill #1

## 2023-04-10 MED ORDER — SILDENAFIL CITRATE 100 MG PO TABS
50.0000 mg | ORAL_TABLET | Freq: Every day | ORAL | 3 refills | Status: DC | PRN
Start: 2023-04-10 — End: 2023-10-09
  Filled 2023-04-10 – 2023-09-29 (×2): qty 30, 30d supply, fill #0

## 2023-04-10 MED ORDER — LOSARTAN POTASSIUM 50 MG PO TABS
50.0000 mg | ORAL_TABLET | Freq: Every day | ORAL | 1 refills | Status: DC
  Filled 2023-04-10: qty 90, 90d supply, fill #0
  Filled 2023-06-14 – 2023-09-29 (×3): qty 90, 90d supply, fill #1

## 2023-04-10 MED ORDER — AMLODIPINE BESYLATE 10 MG PO TABS
10.0000 mg | ORAL_TABLET | Freq: Every day | ORAL | 1 refills | Status: DC
  Filled 2023-04-10: qty 90, 90d supply, fill #0
  Filled 2023-06-14 – 2023-07-21 (×2): qty 90, 90d supply, fill #1

## 2023-04-10 NOTE — Progress Notes (Signed)
Subjective   Patient ID: Michael Fleming, male    DOB: 09-Feb-1971, 52 y.o.   MRN: [id number]  Chief Complaint  Patient presents with   Follow-up    Referring provider: Ivonne Andrew, NP  Michael Fleming is a 52 y.o. male with Past Medical History: No date: Allergy No date: Biceps tendon tear     Comment:  right 12/2018: Heart palpitations No date: Hyperlipidemia No date: Hypertension 12/2018: Hypokalemia No date: Seizures (HCC)     Comment:  last seizure age 58 due to blood clot- 1981 none since    HPI   Patient presents today for follow-up visit for chronic conditions.  Overall he has been doing well.  Patient is compliant with medications.  He states that he does not have any new issues or concerns today.  He does need blood work today.  Denies f/c/s, n/v/d, hemoptysis, PND, leg swelling. Denies chest pain or edema.    No Known Allergies  Immunization History  Administered Date(s) Administered   Influenza,inj,Quad PF,6+ Mos 01/20/2017, 04/08/2022, 01/24/2023   PFIZER(Purple Top)SARS-COV-2 Vaccination 10/29/2019, 01/07/2020   Tdap 09/29/2016    Tobacco History: Social History   Tobacco Use  Smoking Status Never  Smokeless Tobacco Never   Counseling given: Not Answered   Outpatient Encounter Medications as of 04/10/2023  Medication Sig   meloxicam (MOBIC) 15 MG tablet Take 1 tablet (15 mg total) by mouth daily.   [DISCONTINUED] amLODipine (NORVASC) 10 MG tablet Take 1 tablet (10 mg total) by mouth daily.   [DISCONTINUED] atorvastatin (LIPITOR) 40 MG tablet Take 1 tablet (40 mg total) by mouth daily.   [DISCONTINUED] losartan (COZAAR) 50 MG tablet Take 1 tablet (50 mg total) by mouth daily.   [DISCONTINUED] sildenafil (VIAGRA) 100 MG tablet Take 0.5-1 tablets (50-100 mg total) by mouth daily as needed for erectil dysfunction.   amLODipine (NORVASC) 10 MG tablet Take 1 tablet (10 mg total) by mouth daily.   atorvastatin (LIPITOR) 40 MG tablet Take 1 tablet  (40 mg total) by mouth daily.   losartan (COZAAR) 50 MG tablet Take 1 tablet (50 mg total) by mouth daily.   sildenafil (VIAGRA) 100 MG tablet Take 0.5-1 tablets (50-100 mg total) by mouth daily as needed for erectil dysfunction.   No facility-administered encounter medications on file as of 04/10/2023.    Review of Systems  Review of Systems  Constitutional: Negative.   HENT: Negative.    Cardiovascular: Negative.   Gastrointestinal: Negative.   Allergic/Immunologic: Negative.   Neurological: Negative.   Psychiatric/Behavioral: Negative.       Objective:   BP (!) 143/86 (BP Location: Left Arm, Patient Position: Sitting, Cuff Size: Large)   Pulse 78   Ht 5\' 7"  (1.702 m)   Wt 215 lb (97.5 kg)   SpO2 97%   BMI 33.67 kg/m   Wt Readings from Last 5 Encounters:  04/10/23 215 lb (97.5 kg)  02/09/23 217 lb (98.4 kg)  01/03/23 216 lb (98 kg)  12/15/22 217 lb (98.4 kg)  11/29/22 215 lb (97.5 kg)     Physical Exam Vitals and nursing note reviewed.  Constitutional:      General: He is not in acute distress.    Appearance: He is well-developed.  Cardiovascular:     Rate and Rhythm: Normal rate and regular rhythm.  Pulmonary:     Effort: Pulmonary effort is normal.     Breath sounds: Normal breath sounds.  Skin:    General: Skin is warm  and dry.  Neurological:     Mental Status: He is alert and oriented to person, place, and time.       Assessment & Plan:   Chronic pain of both shoulders -     Meloxicam; Take 1 tablet (15 mg total) by mouth daily.  Dispense: 30 tablet; Refill: 0  Essential hypertension -     Losartan Potassium; Take 1 tablet (50 mg total) by mouth daily.  Dispense: 90 tablet; Refill: 1 -     amLODIPine Besylate; Take 1 tablet (10 mg total) by mouth daily.  Dispense: 90 tablet; Refill: 1 -     CBC -     Comprehensive metabolic panel  Other hyperlipidemia -     Atorvastatin Calcium; Take 1 tablet (40 mg total) by mouth daily.  Dispense: 30  tablet; Refill: 1  Medication refill -     Sildenafil Citrate; Take 0.5-1 tablets (50-100 mg total) by mouth daily as needed for erectil dysfunction.  Dispense: 30 tablet; Refill: 3  Erectile dysfunction, unspecified erectile dysfunction type -     Sildenafil Citrate; Take 0.5-1 tablets (50-100 mg total) by mouth daily as needed for erectil dysfunction.  Dispense: 30 tablet; Refill: 3     Return in about 6 months (around 10/08/2023).    Ivonne Andrew, NP 04/10/2023

## 2023-04-10 NOTE — Patient Instructions (Addendum)
1. Essential hypertension  - losartan (COZAAR) 50 MG tablet; Take 1 tablet (50 mg total) by mouth daily.  Dispense: 90 tablet; Refill: 1 - amLODipine (NORVASC) 10 MG tablet; Take 1 tablet (10 mg total) by mouth daily.  Dispense: 90 tablet; Refill: 1 - CBC - Comprehensive metabolic panel  2. Other hyperlipidemia  - atorvastatin (LIPITOR) 40 MG tablet; Take 1 tablet (40 mg total) by mouth daily.  Dispense: 30 tablet; Refill: 1  3. Medication refill  - sildenafil (VIAGRA) 100 MG tablet; Take 0.5-1 tablets (50-100 mg total) by mouth daily as needed for erectil dysfunction.  Dispense: 30 tablet; Refill: 3  4. Erectile dysfunction, unspecified erectile dysfunction type  - sildenafil (VIAGRA) 100 MG tablet; Take 0.5-1 tablets (50-100 mg total) by mouth daily as needed for erectil dysfunction.  Dispense: 30 tablet; Refill: 3  5. Chronic pain of both shoulders  - meloxicam (MOBIC) 15 MG tablet; Take 1 tablet (15 mg total) by mouth daily.  Dispense: 30 tablet; Refill: 0  Follow up:  Follow up in 6 months

## 2023-04-11 LAB — COMPREHENSIVE METABOLIC PANEL
ALT: 33 [IU]/L (ref 0–44)
AST: 31 [IU]/L (ref 0–40)
Albumin: 4.4 g/dL (ref 3.8–4.9)
Alkaline Phosphatase: 100 [IU]/L (ref 44–121)
BUN/Creatinine Ratio: 9 (ref 9–20)
BUN: 10 mg/dL (ref 6–24)
Bilirubin Total: 0.6 mg/dL (ref 0.0–1.2)
CO2: 21 mmol/L (ref 20–29)
Calcium: 9.8 mg/dL (ref 8.7–10.2)
Chloride: 101 mmol/L (ref 96–106)
Creatinine, Ser: 1.1 mg/dL (ref 0.76–1.27)
Globulin, Total: 2.5 g/dL (ref 1.5–4.5)
Glucose: 98 mg/dL (ref 70–99)
Potassium: 3.9 mmol/L (ref 3.5–5.2)
Sodium: 139 mmol/L (ref 134–144)
Total Protein: 6.9 g/dL (ref 6.0–8.5)
eGFR: 80 mL/min/{1.73_m2} (ref >59)

## 2023-04-11 LAB — CBC
Hematocrit: 41 % (ref 37.5–51.0)
Hemoglobin: 13.3 g/dL (ref 13.0–17.7)
MCH: 28.4 pg (ref 26.6–33.0)
MCHC: 32.4 g/dL (ref 31.5–35.7)
MCV: 88 fL (ref 79–97)
Platelets: 293 10*3/uL (ref 150–450)
RBC: 4.68 x10E6/uL (ref 4.14–5.80)
RDW: 12.1 % (ref 11.6–15.4)
WBC: 5.6 10*3/uL (ref 3.4–10.8)

## 2023-04-13 ENCOUNTER — Other Ambulatory Visit: Payer: Self-pay

## 2023-04-25 ENCOUNTER — Telehealth: Payer: Self-pay | Admitting: Nurse Practitioner

## 2023-04-25 NOTE — Telephone Encounter (Signed)
Copied from CRM [id number]. Topic: Clinical - Medication Question >> Apr 25, 2023 12:16 PM Herbert Seta B wrote: Reason for CRM: Patient calling was treated for chronic shoulder pain on 11/18, medication not helping him.

## 2023-04-26 ENCOUNTER — Other Ambulatory Visit: Payer: Self-pay | Admitting: Nurse Practitioner

## 2023-04-26 DIAGNOSIS — G8929 Other chronic pain: Secondary | ICD-10-CM

## 2023-05-02 ENCOUNTER — Ambulatory Visit: Payer: Self-pay | Admitting: Nurse Practitioner

## 2023-05-02 ENCOUNTER — Other Ambulatory Visit: Payer: Self-pay

## 2023-05-02 ENCOUNTER — Encounter (HOSPITAL_BASED_OUTPATIENT_CLINIC_OR_DEPARTMENT_OTHER): Payer: Self-pay

## 2023-05-02 ENCOUNTER — Emergency Department (HOSPITAL_BASED_OUTPATIENT_CLINIC_OR_DEPARTMENT_OTHER): Payer: 59

## 2023-05-02 ENCOUNTER — Emergency Department (HOSPITAL_BASED_OUTPATIENT_CLINIC_OR_DEPARTMENT_OTHER)
Admission: EM | Admit: 2023-05-02 | Discharge: 2023-05-02 | Disposition: A | Discharge status: Home / Self Care | Payer: 59 | Attending: Emergency Medicine | Admitting: Emergency Medicine

## 2023-05-02 DIAGNOSIS — M25512 Pain in left shoulder: Secondary | ICD-10-CM | POA: Diagnosis not present

## 2023-05-02 DIAGNOSIS — M19012 Primary osteoarthritis, left shoulder: Secondary | ICD-10-CM | POA: Diagnosis not present

## 2023-05-02 MED ORDER — KETOROLAC TROMETHAMINE 60 MG/2ML IM SOLN
60.0000 mg | Freq: Once | INTRAMUSCULAR | Status: AC
  Administered 2023-05-02: 60 mg via INTRAMUSCULAR
  Filled 2023-05-02: qty 2

## 2023-05-02 NOTE — Discharge Instructions (Addendum)
It was a pleasure taking care of you today.  You were evaluated in the emergency room for left shoulder pain.  An x-ray was taken which did not show any significant acute abnormality.  Although there is some mild arthritic changes present.  You were provided contact information for a local orthopedic doctor.  Please call make an appointment within the near future.  Otherwise please use anti-inflammatory and pain medication prescribed by your primary care provider.  Should you experience any new or worsening symptoms such as extreme left shoulder pain, redness and swelling please return to the emergency room.

## 2023-05-02 NOTE — ED Provider Notes (Signed)
[facility] Provider Note   CSN: [id number] Arrival date & time: 05/02/23  1352     History  Chief Complaint  Patient presents with   Shoulder Pain    Michael Fleming is a 52 y.o. male presents with left shoulder pain for the past 2 weeks.  He denies any injury or trauma.  Pain is worse with movement.  Reports history of rotator cuff repair on contralateral side.  Describes symptoms as similar.  Notes that he did receive some, injection in his left shoulder several months ago for similar pain without experiencing recurrence.  No fevers or chills.  No IV drug use.   Shoulder Pain      Home Medications Prior to Admission medications   Medication Sig Start Date End Date Taking? Authorizing Provider  amLODipine (NORVASC) 10 MG tablet Take 1 tablet (10 mg total) by mouth daily. 04/10/23   Ivonne Andrew, NP  atorvastatin (LIPITOR) 40 MG tablet Take 1 tablet (40 mg total) by mouth daily. 04/10/23 07/09/23  Ivonne Andrew, NP  losartan (COZAAR) 50 MG tablet Take 1 tablet (50 mg total) by mouth daily. 04/10/23   Ivonne Andrew, NP  meloxicam (MOBIC) 15 MG tablet Take 1 tablet (15 mg total) by mouth daily. 04/10/23   Ivonne Andrew, NP  sildenafil (VIAGRA) 100 MG tablet Take 0.5-1 tablets (50-100 mg total) by mouth daily as needed for erectil dysfunction. 04/10/23   Ivonne Andrew, NP      Allergies    Patient has no known allergies.    Review of Systems   Review of Systems  Musculoskeletal:  Positive for myalgias.    Physical Exam Updated Vital Signs BP (!) 150/86 (BP Location: Right Arm)   Pulse 88   Temp 98.6 F (37 C)   Resp 18   Wt 97.5 kg   SpO2 98%   BMI 33.67 kg/m  Physical Exam Vitals and nursing note reviewed.  Constitutional:      General: He is not in acute distress.    Appearance: He is well-developed.  HENT:     Head: Normocephalic and atraumatic.  Eyes:     Conjunctiva/sclera: Conjunctivae  normal.  Cardiovascular:     Pulses: Normal pulses.     Heart sounds: No murmur heard. Pulmonary:     Effort: Pulmonary effort is normal. No respiratory distress.  Musculoskeletal:        General: No swelling.     Cervical back: Neck supple.     Comments: Tolerates full passive range of motion of the left shoulder.  Anterior lateral tenderness.  5 out of 5 strength.  No erythema, warmth or overlying wound.  Skin:    General: Skin is warm and dry.     Capillary Refill: Capillary refill takes less than 2 seconds.  Neurological:     Mental Status: He is alert.  Psychiatric:        Mood and Affect: Mood normal.     ED Results / Procedures / Treatments   Labs (all labs ordered are listed, but only abnormal results are displayed) Labs Reviewed - No data to display  EKG None  Radiology DG Shoulder Left  Result Date: 05/02/2023 CLINICAL DATA:  Left shoulder pain. EXAM: LEFT SHOULDER - 2+ VIEW COMPARISON:  12/15/2022. FINDINGS: No acute fracture or dislocation. No aggressive osseous lesion. Glenohumeral and acromioclavicular joints are normal in alignment. 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 Mild osteoarthritis of the  glenohumeral joint. No soft tissue swelling. No radiopaque foreign bodies. IMPRESSION: *No acute osseous abnormality of the left shoulder. 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 Electronically Signed   By: Jules Schick M.D.   On: 05/02/2023 15:48    Procedures Procedures    Medications Ordered in ED Medications  ketorolac (TORADOL) injection 60 mg (60 mg Intramuscular Given 05/02/23 1535)    ED Course/ Medical Decision Making/ A&P                                 Medical Decision Making Amount and/or Complexity of Data Reviewed Radiology: ordered.   This patient presents to the ED with chief complaint(s) of left shoulder pain.  The complaint involves an extensive differential diagnosis and also carries with it a high risk of  complications and morbidity.   pertinent past medical history as listed in HPI  The differential diagnosis includes  Fracture, arthritis, muscle strain, tendinopathy, septic joint The initial plan is to  Will obtain x-ray Additional history obtained: Records reviewed Care Everywhere/External Records  Initial Assessment:   Patient is overall well-appearing.  No red flag findings on exam.  No concern for septic joint.  He has good strength. NVI.  Overall suspect musculoskeletal etiology such as tendinopathy  Independent ECG interpretation:  none  Independent labs interpretation:  The following labs were independently interpreted:  none  Independent visualization and interpretation of imaging: I independently visualized the following imaging with scope of interpretation limited to determining acute life threatening conditions related to emergency care: Shoulder xray, which revealed no acute abnormality.  Mild to moderate AC joint arthritis  Treatment and Reassessment: Given IM Toradol while here in the ED  Consultations obtained:   none  Disposition:   Patient will be discharged home.  Encouraged to use anti-inflammatory medications prescribed by PCP.  Provided Ortho contact information to follow-up with. The patient has been appropriately medically screened and/or stabilized in the ED. I have low suspicion for any other emergent medical condition which would require further screening, evaluation or treatment in the ED or require inpatient management. At time of discharge the patient is hemodynamically stable and in no acute distress. I have discussed work-up results and diagnosis with patient and answered all questions. Patient is agreeable with discharge plan. We discussed strict return precautions for returning to the emergency department and they verbalized understanding.     Social Determinants of Health:   none  This note was dictated with voice recognition software.  Despite  best efforts at proofreading, errors may have occurred which can change the documentation meaning.          Final Clinical Impression(s) / ED Diagnoses Final diagnoses:  Acute pain of left shoulder    Rx / DC Orders ED Discharge Orders     None         Halford Decamp, PA-C 05/02/23 1552    Tegeler, Canary Brim, MD 05/02/23 [phone]

## 2023-05-02 NOTE — ED Triage Notes (Signed)
Pt reports left shoulder pain 2 weeks ago. No injury Shoulder aches. Difficulty with abduction of arm. Hx of of rotary cuff tear right shoulder and feels like same pain

## 2023-05-02 NOTE — Telephone Encounter (Signed)
Copied from CRM [id number]. Topic: Clinical - Red Word Triage >> May 02, 2023 12:24 PM Gaetano Hawthorne wrote: Red Word that prompted transfer to Nurse Triage: Pain on Right arm and it is limiting his range of motion - patient is having trouble sleeping on it as well.   Chief Complaint: left shoulder pain Symptoms: radiates to breast bone and bicep Frequency: ongoing for 1 month Pertinent Negatives: Patient denies fever or neck pain Disposition: [x] ED /[] Urgent Care (no appt availability in office) / [] Appointment(In office/virtual)/ []  [facility]/ [] Home Care/ [] Refused Recommended Disposition /[] [employer] [facility]/ []  Follow-up with PCP Additional Notes: The patient was prescribed meloxicam and takes 800 mg ibuprofen; both have been unhelpful for 10/10 left shoulder pain.  The patient reported previous surgery on his right shoulder and now his left shoulder is bothering him.  He did physical therapy using stretch bands a month ago and that is when the pain began and it has gotten progressively worse.  Advised the patient to go to the ER or urgent care for immediate evaluation.   Reason for Disposition  [1] SEVERE pain AND [2] not improved 2 hours after pain medicine  Answer Assessment - Initial Assessment Questions 1. ONSET: "When did the pain start?"     2 months ago I started feeling pain and it seems to be getting worse  2. LOCATION: "Where is the pain located?"     Left shoulder  3. PAIN: "How bad is the pain?" (Scale 1-10; or mild, moderate, severe)   - MILD (1-3): Doesn't interfere with normal activities.   - MODERATE (4-7): Interferes with normal activities (e.g., work or school) or awakens from sleep.   - SEVERE (8-10): Excruciating pain, unable to do any normal activities, unable to hold a cup of water.     Feels like a toothache, makes me want to cry at times 10/10 4. WORK OR EXERCISE: "Has there been any recent work or exercise that involved this part of the body?"     No -    5. CAUSE: "What do you think is causing the arm pain?"     tried to use the stretch things, the pain got worse  6. OTHER SYMPTOMS: "Do you have any other symptoms?" (e.g., neck pain, swelling, rash, fever, numbness, weakness)     Pain goes from shoulder to to the breast to my bicep  Doctor in [place] gave me a shot in it but it wore off and it's pain  Previous procedure on right shoulder that "burnt the nerves"  Protocols used: Arm Pain-A-AH

## 2023-05-04 NOTE — Progress Notes (Unsigned)
PROVIDER NOTE: Information contained herein reflects review and annotations entered in association with encounter. Interpretation of such information and data should be left to medically-trained personnel. Information provided to patient can be located elsewhere in the medical record under "Patient Instructions". Document created using STT-dictation technology, any transcriptional errors that may result from process are unintentional.    Patient: Michael Fleming  Service Category: E/M  Provider: Oswaldo Done, MD  DOB: [birth year]  DOS: 05/08/2023  Referring Provider: Ivonne Andrew, NP  MRN: [id number]  Specialty: Interventional Pain Management  PCP: Ivonne Andrew, NP  Type: Established Patient  Setting: Ambulatory outpatient    Location: Office  Delivery: Face-to-face     HPI  [name], a 52 y.o. year old male, is here today because of his No primary diagnosis found.. [name]'s primary complain today is No chief complaint on file.  Pertinent problems: 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); Tendinosis of rotator cuff (Right); Osteoarthritis of AC (acromioclavicular) joint (Right); Chronic pain syndrome; Chronic shoulder pain (1ry area of Pain) (Right); Partial tear of subscapularis tendon, sequela (Right); Traumatic rupture of biceps tendon, sequela (Right); Cervicogenic headache; DDD (degenerative disc disease), cervical; Cervical foraminal stenosis (Bilateral); Radicular shoulder pain; Cervical facet hypertrophy; Cervical radiculopathy at C6; 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 Pain Assessment: Severity of   is reported as a  /10. Location:    / . Onset:  . Quality:  . Timing:  . Modifying factor(s):  Marland Kitchen Vitals:  vitals were not taken for this visit.  BMI: Estimated body mass index is 33.67 kg/m as calculated from the following:   Height as of 04/10/23: 5\' 7"  (1.702 m).   Weight as of 05/02/23: 215 lb (97.5 kg). Last encounter: 02/09/2023. Last procedure: 01/03/2023.  Reason for encounter:  *** . ***  Discussed the use of AI scribe software for clinical note transcription with the patient, who gave verbal consent to proceed.  History of Present Illness           Pharmacotherapy Assessment  Analgesic:  No chronic opioid analgesics therapy prescribed by our practice. None MME/day: 0 mg/day   Monitoring: [place] PMP: PDMP reviewed during this encounter.       Pharmacotherapy: No side-effects or adverse reactions reported. Compliance: No problems identified. Effectiveness: Clinically acceptable.  No notes on file  No results found for: "CBDTHCR" No results found for: "D8THCCBX" No results found for: "D9THCCBX"  UDS:  Summary  Date Value Ref Range Status  02/16/2022 Note  Final    Comment:    ==================================================================== Compliance Drug Analysis, Ur ==================================================================== Test                             Result       Flag       Units    NO DRUGS DETECTED. ==================================================================== Test                      Result    Flag   Units      Ref Range   Creatinine  28               mg/dL      >=54 ==================================================================== Declared Medications:  The flagging and interpretation on this report are based on the  following declared medications.  Unexpected results may arise from  inaccuracies in the  declared medications.   **Note: The testing scope of this panel does not include the  following reported medications:   Amlodipine (Norvasc)  Atorvastatin (Lipitor)  Losartan (Cozaar) ==================================================================== For clinical consultation, please call [phone]. ====================================================================       ROS  Constitutional: Denies any fever or chills Gastrointestinal: No reported hemesis, hematochezia, vomiting, or acute GI distress Musculoskeletal: Denies any acute onset joint swelling, redness, loss of ROM, or weakness Neurological: No reported episodes of acute onset apraxia, aphasia, dysarthria, agnosia, amnesia, paralysis, loss of coordination, or loss of consciousness  Medication Review  amLODipine, atorvastatin, losartan, meloxicam, and sildenafil  History Review  Allergy: [name] has no known allergies. Drug: [name]  reports no history of drug use. Alcohol:  reports current alcohol use. Tobacco:  reports that he has never smoked. He has never used smokeless tobacco. Social: [name]  reports that he has never smoked. He has never used smokeless tobacco. He reports current alcohol use. He reports that he does not use drugs. Medical:  has a past medical history of Allergy, Biceps tendon tear, Heart palpitations (12/2018), Hyperlipidemia, Hypertension, Hypokalemia (12/2018), and Seizures (HCC). Surgical: [name]  has a past surgical history that includes Brain surgery; Foot surgery; Knee arthroscopy (Left); Wrist surgery (Left); and Shoulder arthroscopy with subacromial decompression and bicep tendon repair (Right, 03/31/2021). Family: family history includes Healthy in his mother; Hypertension in his father.  Laboratory Chemistry Profile   Renal Lab Results  Component Value Date   BUN 10 04/10/2023   CREATININE 1.10 04/10/2023   BCR 9 04/10/2023   GFRAA 82 08/05/2019   GFRNONAA 71  08/05/2019    Hepatic Lab Results  Component Value Date   AST 31 04/10/2023   ALT 33 04/10/2023   ALBUMIN 4.4 04/10/2023   ALKPHOS 100 04/10/2023    Electrolytes Lab Results  Component Value Date   NA 139 04/10/2023   K 3.9 04/10/2023   CL 101 04/10/2023   CALCIUM 9.8 04/10/2023   MG 1.9 02/16/2022    Bone Lab Results  Component Value Date   VD25OH 24.4 (L) 08/05/2020   25OHVITD1 25 (L) 02/16/2022   25OHVITD2 2.0 02/16/2022   25OHVITD3 23 02/16/2022    Inflammation (CRP: Acute Phase) (ESR: Chronic Phase) Lab Results  Component Value Date   CRP 5 02/16/2022   ESRSEDRATE 17 02/16/2022         Note: Above Lab results reviewed.  Recent Imaging Review  DG Shoulder Left CLINICAL DATA:  Left shoulder pain.  EXAM: LEFT SHOULDER - 2+ VIEW  COMPARISON:  12/15/2022.  FINDINGS: No acute fracture or dislocation.  No aggressive osseous lesion.  Glenohumeral and acromioclavicular joints are normal in alignment.  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  Mild osteoarthritis of the glenohumeral joint.  No soft tissue swelling.  No radiopaque foreign bodies.  IMPRESSION: *No acute osseous abnormality of the left shoulder. 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  Electronically Signed   By: Jules Schick M.D.   On: 05/02/2023 15:48 Note: Reviewed        Physical Exam  General appearance: Well nourished, well developed, and well hydrated. In no apparent acute distress Mental status: Alert, oriented x 3 (  person, place, & time)       Respiratory: No evidence of acute respiratory distress Eyes: PERLA Vitals: There were no vitals taken for this visit. BMI: Estimated body mass index is 33.67 kg/m as calculated from the following:   Height as of 04/10/23: 5\' 7"  (1.702 m).   Weight as of 05/02/23: 215 lb (97.5 kg). Ideal: Ideal body weight: 66.1 kg (145 lb 11.6 oz) Adjusted ideal body weight: 78.7 kg (173 lb 6.9 oz)  Assessment    Diagnosis Status  No diagnosis found. Controlled Controlled Controlled   Updated Problems: No problems updated.  Plan of Care  Problem-specific:  Assessment and Plan            [name] has a current medication list which includes the following long-term medication(s): amlodipine, atorvastatin, losartan, and sildenafil.  Pharmacotherapy (Medications Ordered): No orders of the defined types were placed in this encounter.  Orders:  No orders of the defined types were placed in this encounter.  Follow-up plan:   No follow-ups on file.      Interventional Therapies  Risk  Complexity Considerations:   HTN  seizure disorder     Planned  Pending:      Under consideration:      Completed:   Diagnostic/therapeutic left IA glenohumeral/AC joint inj. x1 (01/03/2023) (100/100/100/100) Therapeutic right suprascapular RFA x1 (01/03/2023) (100/100/0/75)  Diagnostic/therapeutic right suprascapular NB x1 (11/29/2022) (100/100/50/50)    Completed by other providers:   Therapeutic right shoulder arthroscopy, extensive debridement, subacromial decompression, open biceps tenodesis (03/31/2021) by Dr. Gershon Mussel [facility])  Diagnostic/therapeutic right subacromial bursa inj. x1 (08/10/2021) by Dr. Gershon Mussel [facility])    Therapeutic  Palliative (PRN) options:   None established       Recent Visits Date Type Provider Dept  02/09/23 Office Visit Delano Metz, MD [facility]  Showing recent visits within past 90 days and meeting all other requirements Future Appointments Date Type Provider Dept  05/08/23 Appointment Delano Metz, MD [facility]  Showing future appointments within next 90 days and meeting all other requirements  I discussed the assessment and treatment plan with the patient. The patient was provided an opportunity to ask questions and all were answered. The patient agreed with the plan and demonstrated an  understanding of the instructions.  Patient advised to call back or seek an in-person evaluation if the symptoms or condition worsens.  Duration of encounter: *** minutes.  Total time on encounter, as per AMA guidelines included both the face-to-face and non-face-to-face time personally spent by the physician and/or other qualified health care professional(s) on the day of the encounter (includes time in activities that require the physician or other qualified health care professional and does not include time in activities normally performed by clinical staff). Physician's time may include the following activities when performed: Preparing to see the patient (e.g., pre-charting review of records, searching for previously ordered imaging, lab work, and nerve conduction tests) Review of prior analgesic pharmacotherapies. Reviewing PMP Interpreting ordered tests (e.g., lab work, imaging, nerve conduction tests) Performing post-procedure evaluations, including interpretation of diagnostic procedures Obtaining and/or reviewing separately obtained history Performing a medically appropriate examination and/or evaluation Counseling and educating the patient/family/caregiver Ordering medications, tests, or procedures Referring and communicating with other health care professionals (when not separately reported) Documenting clinical information in the electronic or other health record Independently interpreting results (not separately reported) and communicating results to the patient/ family/caregiver Care coordination (not separately reported)  Note by:  Oswaldo Done, MD Date: 05/08/2023; Time: 4:26 PM

## 2023-05-05 ENCOUNTER — Telehealth: Payer: Self-pay

## 2023-05-05 NOTE — Telephone Encounter (Signed)
This patient is coming in for left arm pain and to discuss MRI.  It looks like he hasn't had the mri yet.  It was ordered in June.  Look at him and see if you still want him to come in on Monday 05-05-2023. thanks

## 2023-05-08 ENCOUNTER — Ambulatory Visit: Payer: 59 | Attending: Pain Medicine | Admitting: Pain Medicine

## 2023-05-08 ENCOUNTER — Encounter: Payer: Self-pay | Admitting: Pain Medicine

## 2023-05-08 VITALS — BP 150/94 | Temp 99.0°F | Resp 16 | Ht 67.0 in | Wt 216.0 lb

## 2023-05-08 DIAGNOSIS — M4802 Spinal stenosis, cervical region: Secondary | ICD-10-CM | POA: Insufficient documentation

## 2023-05-08 DIAGNOSIS — G8929 Other chronic pain: Secondary | ICD-10-CM | POA: Diagnosis not present

## 2023-05-08 DIAGNOSIS — M5030 Other cervical disc degeneration, unspecified cervical region: Secondary | ICD-10-CM | POA: Diagnosis not present

## 2023-05-08 DIAGNOSIS — M5412 Radiculopathy, cervical region: Secondary | ICD-10-CM | POA: Insufficient documentation

## 2023-05-08 DIAGNOSIS — M79602 Pain in left arm: Secondary | ICD-10-CM | POA: Insufficient documentation

## 2023-05-08 DIAGNOSIS — R937 Abnormal findings on diagnostic imaging of other parts of musculoskeletal system: Secondary | ICD-10-CM | POA: Diagnosis not present

## 2023-05-08 NOTE — Progress Notes (Signed)
Safety precautions to be maintained throughout the outpatient stay will include: orient to surroundings, keep bed in low position, maintain call bell within reach at all times, provide assistance with transfer out of bed and ambulation.  

## 2023-05-08 NOTE — Patient Instructions (Addendum)
 Epidural Steroid Injection  An epidural steroid injection is a shot of steroid medicine, also called cortisone, and a numbing medicine that is given into the epidural space. This space is between the spinal cord and the bones of the back. This shot helps relieve pain caused by an irritated or swollen nerve root. The pain relief you get from the injection depends on the cause of your condition and how long your pain lasts. You may have a period of slightly more pain after your injection, before the steroid medicine takes effect. This medicine usually starts working within 1-3 days. In some cases, you might need 7-10 days to feel the full effect. Tell your health care provider about: Any allergies you have. All medicines you are taking, including vitamins, herbs, eye drops, creams, and over-the-counter medicines. Any problems you or family members have had with anesthesia. Any bleeding problems you have. Any surgeries you have had. Any medical conditions you have. Whether you are pregnant or may be pregnant. What are the risks? Your health care provider will talk with you about risks. These may include: Headache. Bleeding. Infection. Allergic reaction to medicines or dyes. Nerve damage. Not being able to move (paralysis). This is rare. What happens before the procedure? Medicines You may be given medicines to lower anxiety. Ask your provider about: Changing or stopping your regular medicines. These include any diabetes medicines or blood thinners you take. Taking medicines such as aspirin and ibuprofen. These medicines can thin your blood. Do not take them unless your provider tells you to. Taking over-the-counter medicines, vitamins, herbs, and supplements. General instructions Follow instructions from your provider about what you may eat and drink. Ask your provider what steps will be taken to help prevent infection. If you will be going home right after the procedure, plan to have a  responsible adult: Take you home from the hospital or clinic. You will not be allowed to drive. Care for you for the time you are told. What happens during the procedure?  An IV will be inserted into one of your veins. You may be given a sedative to help you relax. You will be asked to sit or lie on your side. The injection site will be cleaned. An X-ray machine will be used to guide the needle close to the nerve that is causing pain. A needle will be put through your skin into the epidural space. This may cause you some discomfort. Contrast dye may be injected at the site to make sure that the steroid medicine will be sent to the exact place it needs to go. The steroid medicine and a numbing medicine will be injected into the epidural space for pain relief. The needle will be removed. A bandage (dressing) will be put over the injection site. The procedure may vary among providers and hospitals. What happens after the procedure? Your blood pressure, heart rate, breathing rate, and blood oxygen level will be monitored until you leave the hospital or clinic. Your IV will be removed. Your arm or leg may feel weak or numb for a few hours. This information is not intended to replace advice given to you by your health care provider. Make sure you discuss any questions you have with your health care provider. Document Revised: 12/17/2021 Document Reviewed: 12/17/2021 Elsevier Patient Education  2024 [employer]  ______________________________________________________________________    Procedure instructions  Stop blood-thinners  Do not eat or drink fluids (other than water) for 6 hours before your procedure  No water for  2 hours before your procedure  Take your blood pressure medicine with a sip of water  Arrive 30 minutes before your appointment  If sedation is planned, bring suitable driver. Pennie Banter, Benedetto Goad, & public transportation are NOT APPROVED)  Carefully read the "Preparing  for your procedure" detailed instructions  If you have questions call us at [phone]  Procedure appointments are for procedures only. NO medication refills or new problem evaluations.   ______________________________________________________________________      ______________________________________________________________________    Preparing for your procedure  Appointments: If you think you may not be able to keep your appointment, call 24-48 hours in advance to cancel. We need time to make it available to others.  Procedure visits are for procedures only. During your procedure appointment there will be: NO Prescription Refills*. NO medication changes or discussions*. NO discussion of disability issues*. NO unrelated pain problem evaluations*. NO evaluations to order other pain procedures*. *These will be addressed at a separate and distinct evaluation encounter on the provider's evaluation schedule and not during procedure days.  Instructions: Food intake: Avoid eating anything solid for at least 8 hours prior to your procedure. Clear liquid intake: You may take clear liquids such as water up to 2 hours prior to your procedure. (No carbonated drinks. No soda.) Transportation: Unless otherwise stated by your physician, bring a driver. (Driver cannot be a Market researcher, Pharmacist, community, or any other form of public transportation.) Morning Medicines: Except for blood thinners, take all of your other morning medications with a sip of water. Make sure to take your heart and blood pressure medicines. If your blood pressure's lower number is above 100, the case will be rescheduled. Blood thinners: Make sure to stop your blood thinners as instructed.  If you take a blood thinner, but were not instructed to stop it, call our office [phone] and ask to talk to a nurse. Not stopping a blood thinner prior to certain procedures could lead to serious complications. Diabetics on insulin: Notify the staff  so that you can be scheduled 1st case in the morning. If your diabetes requires high dose insulin, take only  of your normal insulin dose the morning of the procedure and notify the staff that you have done so. Preventing infections: Shower with an antibacterial soap the morning of your procedure.  Build-up your immune system: Take 1000 mg of Vitamin C with every meal (3 times a day) the day prior to your procedure. Antibiotics: Inform the nursing staff if you are taking any antibiotics or if you have any conditions that may require antibiotics prior to procedures. (Example: recent joint implants)   Pregnancy: If you are pregnant make sure to notify the nursing staff. Not doing so may result in injury to the fetus, including death.  Sickness: If you have a cold, fever, or any active infections, call and cancel or reschedule your procedure. Receiving steroids while having an infection may result in complications. Arrival: You must be in the facility at least 30 minutes prior to your scheduled procedure. Tardiness: Your scheduled time is also the cutoff time. If you do not arrive at least 15 minutes prior to your procedure, you will be rescheduled.  Children: Do not bring any children with you. Make arrangements to keep them home. Dress appropriately: There is always a possibility that your clothing may get soiled. Avoid long dresses. Valuables: Do not bring any jewelry or valuables.  Reasons to call and reschedule or cancel your procedure: (Following these recommendations will minimize the  risk of a serious complication.) Surgeries: Avoid having procedures within 2 weeks of any surgery. (Avoid for 2 weeks before or after any surgery). Flu Shots: Avoid having procedures within 2 weeks of a flu shots or . (Avoid for 2 weeks before or after immunizations). Barium: Avoid having a procedure within 7-10 days after having had a radiological study involving the use of radiological contrast. (Myelograms,  Barium swallow or enema study). Heart attacks: Avoid any elective procedures or surgeries for the initial 6 months after a "Myocardial Infarction" (Heart Attack). Blood thinners: It is imperative that you stop these medications before procedures. Let us know if you if you take any blood thinner.  Infection: Avoid procedures during or within two weeks of an infection (including chest colds or gastrointestinal problems). Symptoms associated with infections include: Localized redness, fever, chills, night sweats or profuse sweating, burning sensation when voiding, cough, congestion, stuffiness, runny nose, sore throat, diarrhea, nausea, vomiting, cold or Flu symptoms, recent or current infections. It is specially important if the infection is over the area that we intend to treat. Heart and lung problems: Symptoms that may suggest an active cardiopulmonary problem include: cough, chest pain, breathing difficulties or shortness of breath, dizziness, ankle swelling, uncontrolled high or unusually low blood pressure, and/or palpitations. If you are experiencing any of these symptoms, cancel your procedure and contact your primary care physician for an evaluation.  Remember:  Regular Business hours are:  Monday to Thursday 8:00 AM to 4:00 PM  Provider's Schedule: Delano Metz, MD:  Procedure days: Tuesday and Thursday 7:30 AM to 4:00 PM  Edward Jolly, MD:  Procedure days: Monday and Wednesday 7:30 AM to 4:00 PM Last  Updated: 05/02/2023 ______________________________________________________________________      ______________________________________________________________________    General Risks and Possible Complications  Patient Responsibilities: It is important that you read this as it is part of your informed consent. It is our duty to inform you of the risks and possible complications associated with treatments offered to you. It is your responsibility as a patient to read this and to  ask questions about anything that is not clear or that you believe was not covered in this document.  Patient's Rights: You have the right to refuse treatment. You also have the right to change your mind, even after initially having agreed to have the treatment done. However, under this last option, if you wait until the last second to change your mind, you may be charged for the materials used up to that point.  Introduction: Medicine is not an Visual merchandiser. Everything in Medicine, including the lack of treatment(s), carries the potential for danger, harm, or loss (which is by definition: Risk). In Medicine, a complication is a secondary problem, condition, or disease that can aggravate an already existing one. All treatments carry the risk of possible complications. The fact that a side effects or complications occurs, does not imply that the treatment was conducted incorrectly. It must be clearly understood that these can happen even when everything is done following the highest safety standards.  No treatment: You can choose not to proceed with the proposed treatment alternative. The "PRO(s)" would include: avoiding the risk of complications associated with the therapy. The "CON(s)" would include: not getting any of the treatment benefits. These benefits fall under one of three categories: diagnostic; therapeutic; and/or palliative. Diagnostic benefits include: getting information which can ultimately lead to improvement of the disease or symptom(s). Therapeutic benefits are those associated with the successful treatment of the  disease. Finally, palliative benefits are those related to the decrease of the primary symptoms, without necessarily curing the condition (example: decreasing the pain from a flare-up of a chronic condition, such as incurable terminal cancer).  General Risks and Complications: These are associated to most interventional treatments. They can occur alone, or in combination. They  fall under one of the following six (6) categories: no benefit or worsening of symptoms; bleeding; infection; nerve damage; allergic reactions; and/or death. No benefits or worsening of symptoms: In Medicine there are no guarantees, only probabilities. No healthcare provider can ever guarantee that a medical treatment will work, they can only state the probability that it may. Furthermore, there is always the possibility that the condition may worsen, either directly, or indirectly, as a consequence of the treatment. Bleeding: This is more common if the patient is taking a blood thinner, either prescription or over the counter (example: Goody Powders, Fish oil, Aspirin, Garlic, etc.), or if suffering a condition associated with impaired coagulation (example: Hemophilia, cirrhosis of the liver, low platelet counts, etc.). However, even if you do not have one on these, it can still happen. If you have any of these conditions, or take one of these drugs, make sure to notify your treating physician. Infection: This is more common in patients with a compromised immune system, either due to disease (example: diabetes, cancer, human immunodeficiency virus [HIV], etc.), or due to medications or treatments (example: therapies used to treat cancer and rheumatological diseases). However, even if you do not have one on these, it can still happen. If you have any of these conditions, or take one of these drugs, make sure to notify your treating physician. Nerve Damage: This is more common when the treatment is an invasive one, but it can also happen with the use of medications, such as those used in the treatment of cancer. The damage can occur to small secondary nerves, or to large primary ones, such as those in the spinal cord and brain. This damage may be temporary or permanent and it may lead to impairments that can range from temporary numbness to permanent paralysis and/or brain death. Allergic Reactions: Any time a  substance or material comes in contact with our body, there is the possibility of an allergic reaction. These can range from a mild skin rash (contact dermatitis) to a severe systemic reaction (anaphylactic reaction), which can result in death. Death: In general, any medical intervention can result in death, most of the time due to an unforeseen complication. ______________________________________________________________________

## 2023-05-15 ENCOUNTER — Telehealth: Payer: Self-pay | Admitting: Specialist

## 2023-05-15 NOTE — Telephone Encounter (Signed)
Patient is supposed to have a scan.  Our office put the incorrect shoulder on the order.  Please call to confirm correct shoulder and send new order for scan. Shirlean Mylar, MHA, OT/L [phone]

## 2023-05-15 NOTE — Telephone Encounter (Signed)
Mr. Caraway will call his PCP about the shoulder. [facility] have not ordered any imaging.   He does have a referral to [facility]. He will contact the referral provider for correction on referral. And  have it re-submitted if needed.

## 2023-05-22 ENCOUNTER — Telehealth: Payer: Self-pay

## 2023-05-22 NOTE — Telephone Encounter (Signed)
Copied from CRM [id number]. Topic: Clinical - Request for Lab/Test Order >> May 22, 2023 12:47 PM Gildardo Pounds wrote: Reason for CRM: Having a procedure done in the morning. spoke with someone about a referral being sent to nerve doctor for left shoulder. Referral for pain management showing but no order for the scan needed before appt tomorrow. Patient's callback number is [phone]

## 2023-05-23 ENCOUNTER — Ambulatory Visit: Payer: 59 | Attending: Pain Medicine | Admitting: Pain Medicine

## 2023-05-23 ENCOUNTER — Encounter: Payer: Self-pay | Admitting: Pain Medicine

## 2023-05-23 ENCOUNTER — Ambulatory Visit
Admission: RE | Admit: 2023-05-23 | Discharge: 2023-05-23 | Disposition: A | Discharge status: Home / Self Care | Payer: 59 | Source: Ambulatory Visit | Attending: Pain Medicine | Admitting: Pain Medicine

## 2023-05-23 VITALS — BP 142/90 | Temp 98.2°F | Resp 14 | Ht 67.0 in | Wt 215.0 lb

## 2023-05-23 DIAGNOSIS — M542 Cervicalgia: Secondary | ICD-10-CM | POA: Insufficient documentation

## 2023-05-23 DIAGNOSIS — R937 Abnormal findings on diagnostic imaging of other parts of musculoskeletal system: Secondary | ICD-10-CM | POA: Diagnosis not present

## 2023-05-23 DIAGNOSIS — M5030 Other cervical disc degeneration, unspecified cervical region: Secondary | ICD-10-CM | POA: Diagnosis not present

## 2023-05-23 DIAGNOSIS — M79602 Pain in left arm: Secondary | ICD-10-CM | POA: Diagnosis not present

## 2023-05-23 DIAGNOSIS — M25512 Pain in left shoulder: Secondary | ICD-10-CM | POA: Insufficient documentation

## 2023-05-23 DIAGNOSIS — G4486 Cervicogenic headache: Secondary | ICD-10-CM | POA: Diagnosis not present

## 2023-05-23 DIAGNOSIS — G8929 Other chronic pain: Secondary | ICD-10-CM | POA: Insufficient documentation

## 2023-05-23 DIAGNOSIS — M50121 Cervical disc disorder at C4-C5 level with radiculopathy: Secondary | ICD-10-CM | POA: Insufficient documentation

## 2023-05-23 DIAGNOSIS — M4802 Spinal stenosis, cervical region: Secondary | ICD-10-CM | POA: Diagnosis not present

## 2023-05-23 DIAGNOSIS — M5412 Radiculopathy, cervical region: Secondary | ICD-10-CM | POA: Insufficient documentation

## 2023-05-23 MED ORDER — DEXAMETHASONE SODIUM PHOSPHATE 10 MG/ML IJ SOLN
INTRAMUSCULAR | Status: AC
  Filled 2023-05-23: qty 1

## 2023-05-23 MED ORDER — LIDOCAINE HCL 2 % IJ SOLN
20.0000 mL | Freq: Once | INTRAMUSCULAR | Status: AC
Start: 2023-05-23 — End: 2023-05-23
  Administered 2023-05-23: 100 mg

## 2023-05-23 MED ORDER — DEXAMETHASONE SODIUM PHOSPHATE 10 MG/ML IJ SOLN
10.0000 mg | Freq: Once | INTRAMUSCULAR | Status: AC
Start: 2023-05-23 — End: 2023-05-23
  Administered 2023-05-23: 10 mg

## 2023-05-23 MED ORDER — FENTANYL CITRATE (PF) 100 MCG/2ML IJ SOLN
INTRAMUSCULAR | Status: AC
  Filled 2023-05-23: qty 2

## 2023-05-23 MED ORDER — IOHEXOL 180 MG/ML  SOLN
10.0000 mL | Freq: Once | INTRAMUSCULAR | Status: AC
Start: 2023-05-23 — End: 2023-05-23
  Administered 2023-05-23: 10 mL via EPIDURAL

## 2023-05-23 MED ORDER — LIDOCAINE HCL (PF) 2 % IJ SOLN
INTRAMUSCULAR | Status: AC
  Filled 2023-05-23: qty 10

## 2023-05-23 MED ORDER — FENTANYL CITRATE (PF) 100 MCG/2ML IJ SOLN: 25.0000 ug | INTRAMUSCULAR | Status: DC | PRN

## 2023-05-23 MED ORDER — IOHEXOL 180 MG/ML  SOLN
INTRAMUSCULAR | Status: AC
  Filled 2023-05-23: qty 10

## 2023-05-23 MED ORDER — ROPIVACAINE HCL 2 MG/ML IJ SOLN
INTRAMUSCULAR | Status: AC
  Filled 2023-05-23: qty 20

## 2023-05-23 MED ORDER — ROPIVACAINE HCL 2 MG/ML IJ SOLN
1.0000 mL | Freq: Once | INTRAMUSCULAR | Status: AC
Start: 2023-05-23 — End: 2023-05-23
  Administered 2023-05-23: 1 mL via EPIDURAL

## 2023-05-23 MED ORDER — MIDAZOLAM HCL 5 MG/5ML IJ SOLN
INTRAMUSCULAR | Status: AC
  Filled 2023-05-23: qty 5

## 2023-05-23 MED ORDER — PENTAFLUOROPROP-TETRAFLUOROETH EX AERO
INHALATION_SPRAY | Freq: Once | CUTANEOUS | Status: AC
Start: 2023-05-23 — End: 2023-05-23
  Administered 2023-05-23: 30 via TOPICAL

## 2023-05-23 MED ORDER — SODIUM CHLORIDE (PF) 0.9 % IJ SOLN
INTRAMUSCULAR | Status: AC
  Filled 2023-05-23: qty 10

## 2023-05-23 MED ORDER — MIDAZOLAM HCL 5 MG/5ML IJ SOLN
0.5000 mg | Freq: Once | INTRAMUSCULAR | Status: AC
  Administered 2023-05-23: 2 mg via INTRAVENOUS

## 2023-05-23 MED ORDER — SODIUM CHLORIDE 0.9% FLUSH
1.0000 mL | Freq: Once | INTRAVENOUS | Status: AC
Start: 2023-05-23 — End: 2023-05-23
  Administered 2023-05-23: 1 mL

## 2023-05-23 NOTE — Patient Instructions (Addendum)
 ______________________________________________________________________    Post-Procedure Discharge Instructions  Instructions: Apply ice:  Purpose: This will minimize any swelling and discomfort after procedure.  When: Day of procedure, as soon as you get home. How: Fill a plastic sandwich bag with crushed ice. Cover it with a small towel and apply to injection site. How long: (15 min on, 15 min off) Apply for 15 minutes then remove x 15 minutes.  Repeat sequence on day of procedure, until you go to bed. Apply heat:  Purpose: To treat any soreness and discomfort from the procedure. When: Starting the next day after the procedure. How: Apply heat to procedure site starting the day following the procedure. How long: May continue to repeat daily, until discomfort goes away. Food intake: Start with clear liquids (like water) and advance to regular food, as tolerated.  Physical activities: Keep activities to a minimum for the first 8 hours after the procedure. After that, then as tolerated. Driving: If you have received any sedation, be responsible and do not drive. You are not allowed to drive for 24 hours after having sedation. Blood thinner: (Applies only to those taking blood thinners) You may restart your blood thinner 6 hours after your procedure. Insulin: (Applies only to Diabetic patients taking insulin) As soon as you can eat, you may resume your normal dosing schedule. Infection prevention: Keep procedure site clean and dry. Shower daily and clean area with soap and water. Post-procedure Pain Diary: Extremely important that this be done correctly and accurately. Recorded information will be used to determine the next step in treatment. For the purpose of accuracy, follow these rules: Evaluate only the area treated. Do not report or include pain from an untreated area. For the purpose of this evaluation, ignore all other areas of pain, except for the treated area. After your procedure,  avoid taking a long nap and attempting to complete the pain diary after you wake up. Instead, set your alarm clock to go off every hour, on the hour, for the initial 8 hours after the procedure. Document the duration of the numbing medicine, and the relief you are getting from it. Do not go to sleep and attempt to complete it later. It will not be accurate. If you received sedation, it is likely that you were given a medication that may cause amnesia. Because of this, completing the diary at a later time may cause the information to be inaccurate. This information is needed to plan your care. Follow-up appointment: Keep your post-procedure follow-up evaluation appointment after the procedure (usually 2 weeks for most procedures, 6 weeks for radiofrequencies). DO NOT FORGET to bring you pain diary with you.   Expect: (What should I expect to see with my procedure?) From numbing medicine (AKA: Local Anesthetics): Numbness or decrease in pain. You may also experience some weakness, which if present, could last for the duration of the local anesthetic. Onset: Full effect within 15 minutes of injected. Duration: It will depend on the type of local anesthetic used. On the average, 1 to 8 hours.  From steroids (Applies only if steroids were used): Decrease in swelling or inflammation. Once inflammation is improved, relief of the pain will follow. Onset of benefits: Depends on the amount of swelling present. The more swelling, the longer it will take for the benefits to be seen. In some cases, up to 10 days. Duration: Steroids will stay in the system x 2 weeks. Duration of benefits will depend on multiple posibilities including persistent irritating factors. Side-effects: If  present, they may typically last 2 weeks (the duration of the steroids). Frequent: Cramps (if they occur, drink Gatorade and take over-the-counter Magnesium 450-500 mg once to twice a day); water retention with temporary weight gain;  increases in blood sugar; decreased immune system response; increased appetite. Occasional: Facial flushing (red, warm cheeks); mood swings; menstrual changes. Uncommon: Long-term decrease or suppression of natural hormones; bone thinning. (These are more common with higher doses or more frequent use. This is why we prefer that our patients avoid having any injection therapies in other practices.)  Very Rare: Severe mood changes; psychosis; aseptic necrosis. From procedure: Some discomfort is to be expected once the numbing medicine wears off. This should be minimal if ice and heat are applied as instructed.  Call if: (When should I call?) You experience numbness and weakness that gets worse with time, as opposed to wearing off. New onset bowel or bladder incontinence. (Applies only to procedures done in the spine)  Emergency Numbers: Durning business hours (Monday - Thursday, 8:00 AM - 4:00 PM) (Friday, 9:00 AM - 12:00 Noon): [phone] After hours: [phone] NOTE: If you are having a problem and are unable connect with, or to talk to a provider, then go to your nearest urgent care or emergency department. If the problem is serious and urgent, please call 911. Bring your Post Procedure Diary to your follow-up appointment ______________________________________________________________________

## 2023-05-23 NOTE — Progress Notes (Signed)
 PROVIDER NOTE: Interpretation of information contained herein should be left to medically-trained personnel. Specific patient instructions are provided elsewhere under Patient Instructions section of medical record. This document was created in part using STT-dictation technology, any transcriptional errors that may result from this process are unintentional.  Patient: Michael Fleming Type: Established DOB: 05-26-1970 MRN: [id number] PCP: Oley Bascom RAMAN, NP  Service: Procedure DOS: 05/23/2023 Setting: Ambulatory Location: Ambulatory outpatient facility Delivery: Face-to-face Provider: Eric [name] Como, MD Specialty: Interventional Pain Management Specialty designation: 09 Location: Outpatient facility Ref. Prov.: Como Eric, MD       Interventional Therapy   Procedure: Cervical Epidural Steroid injection (CESI) (Interlaminar) #1  Laterality: Left  Level: C7-T1 Imaging: Fluoroscopy-assisted DOS: 05/23/2023  Performed by: Eric Como, MD Anesthesia: Local anesthesia (1-2% Lidocaine ) Anxiolysis: IV Versed  2.0 mg Sedation: Moderate Sedation None required. No Fentanyl  administered.           Purpose: Diagnostic/Therapeutic Indications: Cervicalgia, cervical radicular pain, degenerative disc disease, severe enough to impact quality of life or function. 1. Cervical radiculopathy (Left)   2. Cervical radiculopathy at C6   3. Cervical foraminal stenosis (Bilateral)   4. Cervical disc disorder at C4-C5 level with radiculopathy   5. Chronic shoulder pain (Left)   6. Chronic upper extremity pain (Left)   7. DDD (degenerative disc disease), cervical   8. Radicular shoulder pain   9. Cervicalgia   10. Cervicogenic headache   11. Abnormal CT scan, cervical spine (12/07/2022)    NAS-11 score:   Pre-procedure: 7 /10   Post-procedure: 0-No pain/10      Position  Prep  Materials:  Location setting: Procedure suite Position: Prone, on modified reverse trendelenburg to  facilitate breathing, with head in head-cradle. Pillows positioned under chest (below chin-level) with cervical spine flexed. Safety Precautions: Patient was assessed for positional comfort and pressure points before starting the procedure. Prepping solution: DuraPrep (Iodine  Povacrylex [0.7% available iodine ] and Isopropyl Alcohol, 74% w/w) Prep Area: Entire  cervicothoracic region Approach: percutaneous, paramedial Intended target: Posterior cervical epidural space Materials Procedure:  Tray: Epidural Needle(s): Epidural (Tuohy) Qty: 1 Length: (90mm) 3.5-inch Gauge: 17G   H&P (Pre-op Assessment):  Michael Fleming is a 52 y.o. (year old), male patient, seen today for interventional treatment. He  has a past surgical history that includes Brain surgery; Foot surgery; Knee arthroscopy (Left); Wrist surgery (Left); and Shoulder arthroscopy with subacromial decompression and bicep tendon repair (Right, 03/31/2021). Michael Fleming has a current medication list which includes the following prescription(s): amlodipine , atorvastatin , losartan , meloxicam , and sildenafil , and the following Facility-Administered Medications: fentanyl . His primarily concern today is the Shoulder Pain (left)  Initial Vital Signs:  Pulse/HCG Rate:  ECG Heart Rate: 84 (nsr) Temp: 98.2 F (36.8 C) Resp: 16 BP: (!) 156/96 SpO2: 97 %  BMI: Estimated body mass index is 33.67 kg/m as calculated from the following:   Height as of this encounter: 5' 7 (1.702 m).   Weight as of this encounter: 215 lb (97.5 kg).  Risk Assessment: Allergies: Reviewed. He has no known allergies.  Allergy Precautions: None required Coagulopathies: Reviewed. None identified.  Blood-thinner therapy: None at this time Active Infection(s): Reviewed. None identified. Michael Fleming is afebrile  Site Confirmation: Michael Fleming was asked to confirm the procedure and laterality before marking the site Procedure checklist: Completed Consent: Before the  procedure and under the influence of no sedative(s), amnesic(s), or anxiolytics, the patient was informed of the treatment options, risks and possible complications. To fulfill our ethical and legal obligations,  as recommended by the American Medical Association's Code of Ethics, I have informed the patient of my clinical impression; the nature and purpose of the treatment or procedure; the risks, benefits, and possible complications of the intervention; the alternatives, including doing nothing; the risk(s) and benefit(s) of the alternative treatment(s) or procedure(s); and the risk(s) and benefit(s) of doing nothing. The patient was provided information about the general risks and possible complications associated with the procedure. These may include, but are not limited to: failure to achieve desired goals, infection, bleeding, organ or nerve damage, allergic reactions, paralysis, and death. In addition, the patient was informed of those risks and complications associated to Spine-related procedures, such as failure to decrease pain; infection (i.e.: Meningitis, epidural or intraspinal abscess); bleeding (i.e.: epidural hematoma, subarachnoid hemorrhage, or any other type of intraspinal or peri-dural bleeding); organ or nerve damage (i.e.: Any type of peripheral nerve, nerve root, or spinal cord injury) with subsequent damage to sensory, motor, and/or autonomic systems, resulting in permanent pain, numbness, and/or weakness of one or several areas of the body; allergic reactions; (i.e.: anaphylactic reaction); and/or death. Furthermore, the patient was informed of those risks and complications associated with the medications. These include, but are not limited to: allergic reactions (i.e.: anaphylactic or anaphylactoid reaction(s)); adrenal axis suppression; blood sugar elevation that in diabetics may result in ketoacidosis or comma; water retention that in patients with history of congestive heart failure  may result in shortness of breath, pulmonary edema, and decompensation with resultant heart failure; weight gain; swelling or edema; medication-induced neural toxicity; particulate matter embolism and blood vessel occlusion with resultant organ, and/or nervous system infarction; and/or aseptic necrosis of one or more joints. Finally, the patient was informed that Medicine is not an exact science; therefore, there is also the possibility of unforeseen or unpredictable risks and/or possible complications that may result in a catastrophic outcome. The patient indicated having understood very clearly. We have given the patient no guarantees and we have made no promises. Enough time was given to the patient to ask questions, all of which were answered to the patient's satisfaction. Michael Fleming has indicated that he wanted to continue with the procedure. Attestation: I, the ordering provider, attest that I have discussed with the patient the benefits, risks, side-effects, alternatives, likelihood of achieving goals, and potential problems during recovery for the procedure that I have provided informed consent. Date  Time: 05/23/2023  8:49 AM   Pre-Procedure Preparation:  Monitoring: As per clinic protocol. Respiration, ETCO2, SpO2, BP, heart rate and rhythm monitor placed and checked for adequate function Safety Precautions: Patient was assessed for positional comfort and pressure points before starting the procedure. Time-out: I initiated and conducted the Time-out before starting the procedure, as per protocol. The patient was asked to participate by confirming the accuracy of the Time Out information. Verification of the correct person, site, and procedure were performed and confirmed by me, the nursing staff, and the patient. Time-out conducted as per Joint Commission's Universal Protocol (UP.01.01.01). Time: 0919 Start Time: 0919 hrs.  Description  Narrative of Procedure:          Rationale  (medical necessity): procedure needed and proper for the diagnosis and/or treatment of the patient's medical symptoms and needs. Start Time: 0919 hrs. Safety Precautions: Aspiration looking for blood return was conducted prior to all injections. At no point did we inject any substances, as a needle was being advanced. No attempts were made at seeking any paresthesias. Safe injection practices and needle disposal  techniques used. Medications properly checked for expiration dates. SDV (single dose vial) medications used. Description of procedure: Protocol guidelines were followed. The patient was assisted into a comfortable position. The target area was identified and the area prepped in the usual manner. Skin & deeper tissues infiltrated with local anesthetic. Appropriate amount of time allowed to pass for local anesthetics to take effect. Using fluoroscopic guidance, the epidural needle was introduced through the skin, ipsilateral to the reported pain, and advanced to the target area. Posterior laminar os was contacted and the needle walked caudad, until the lamina was cleared. The ligamentum flavum was engaged and the epidural space identified using "loss-of-resistance technique" with 2-3 ml of PF-NaCl (0.9% NSS), in a 5cc dedicated LOR syringe. (See Imaging guidance below for use of contrast details.) Once proper needle placement was secured, and negative aspiration confirmed, the solution was injected in intermittent fashion, asking for systemic symptoms every 0.5cc. The needles were then removed and the area cleansed, making sure to leave some of the prepping solution back to take advantage of its long term bactericidal properties.  Vitals:   05/23/23 0919 05/23/23 0924 05/23/23 0927 05/23/23 0936  BP: (!) 140/96 (!) 147/104 (!) 146/101 (!) 142/90  Resp: 17 15 18 14   Temp:      SpO2: 100% 100% 100%   Weight:      Height:         End Time: 0926 hrs.  Imaging Guidance (Spinal):          Type of  Imaging Technique: Fluoroscopy Guidance (Spinal) Indication(s): Fluoroscopy guidance for needle placement to enhance accuracy in procedures requiring precise needle localization for targeted delivery of medication in or near specific anatomical locations not easily accessible without such real-time imaging assistance. Exposure Time: Please see nurses notes. Contrast: Before injecting any contrast, we confirmed that the patient did not have an allergy to iodine , shellfish, or radiological contrast. Once satisfactory needle placement was completed at the desired level, radiological contrast was injected. Contrast injected under live fluoroscopy. No contrast complications. See chart for type and volume of contrast used. Fluoroscopic Guidance: I was personally present during the use of fluoroscopy. Tunnel Vision Technique used to obtain the best possible view of the target area. Parallax error corrected before commencing the procedure. Direction-depth-direction technique used to introduce the needle under continuous pulsed fluoroscopy. Once target was reached, antero-posterior, oblique, and lateral fluoroscopic projection used confirm needle placement in all planes. Images permanently stored in EMR. Interpretation: I personally interpreted the imaging intraoperatively. Adequate needle placement confirmed in multiple planes. Appropriate spread of contrast into desired area was observed. No evidence of afferent or efferent intravascular uptake. No intrathecal or subarachnoid spread observed. Permanent images saved into the patient's record.  Post-operative Assessment:  Post-procedure Vital Signs:  Pulse/HCG Rate:  81 Temp: 98.2 F (36.8 C) Resp: 14 BP: (!) 142/90 SpO2: 100 %  EBL: None  Complications: No immediate post-treatment complications observed by team, or reported by patient.  Note: The patient tolerated the entire procedure well. A repeat set of vitals were taken after the procedure and  the patient was kept under observation following institutional policy, for this type of procedure. Post-procedural neurological assessment was performed, showing return to baseline, prior to discharge. The patient was provided with post-procedure discharge instructions, including a section on how to identify potential problems. Should any problems arise concerning this procedure, the patient was given instructions to immediately contact us , at any time, without hesitation. In any case, we plan to  contact the patient by telephone for a follow-up status report regarding this interventional procedure.  Comments:  No additional relevant information.  Plan of Care (POC)  Orders:  Orders Placed This Encounter  Procedures   Cervical Epidural Injection    Indication(s): Radiculitis and cervicalgia associated with cervical degenerative disc disease. Position: Prone Imaging guidance: Fluoroscopy required. Contrast required unless contraindicated by allergy or severe CKD. Equipment & Materials: Epidural tray & needle.    Scheduling Instructions:     Procedure: Cervical Epidural Steroid Injection/Block     Planned Level(s): C7-T1     Laterality: Left-sided     Anxiolysis: Patient's choice.     Timeframe: Today    Where will this procedure be performed?:   [facility]             by Dr. T[facility] 1-60 MIN NO REPORT    Intraoperative interpretation by procedural physician at [facility].    Standing Status:   Standing    Number of Occurrences:   1    Reason for exam::   Assistance in needle guidance and placement for procedures requiring needle placement in or near specific anatomical locations not easily accessible without such assistance.   Informed Consent Details: Physician/Practitioner Attestation; Transcribe to consent form and obtain patient signature    Nursing instructions: Transcribe to consent form and obtain patient signature. Always confirm  laterality of pain with Michael Fleming, before procedure.    Physician/Practitioner attestation of informed consent for procedure/surgical case:   I, the physician/practitioner, attest that I have discussed with the patient the benefits, risks, side effects, alternatives, likelihood of achieving goals and potential problems during recovery for the procedure that I have provided informed consent.    Procedure:   Cervical Epidural Steroid Injection (CESI) under fluoroscopic guidance    Physician/Practitioner performing the procedure:   Colbi Staubs A. Tanya MD    Indication/Reason:   Indications: Cervicalgia (neck pain), cervical radicular pain, radiculitis (arm/shoulder pain, numbness, and/or weakness), degenerative disc disease, severe enough to greatly impact quality of life or function.   Provide equipment / supplies at bedside    Procedural tray: Epidural Tray (Disposable  single use) Skin infiltration needle: Regular 1.5-in, 25-G, (x1) Block needle size: Regular standard Catheter: No catheter required    Standing Status:   Standing    Number of Occurrences:   1    Specify:   Epidural Tray   Saline lock IV    Have LR [phone] available and administer at 125 mL/hr if patient becomes hypotensive.    Standing Status:   Standing    Number of Occurrences:   1   Chronic Opioid Analgesic:   No chronic opioid analgesics therapy prescribed by our practice. None MME/day: 0 mg/day   Medications ordered for procedure: Meds ordered this encounter  Medications   iohexol  (OMNIPAQUE ) 180 MG/ML injection 10 mL    Must be Myelogram-compatible. If not available, you may substitute with a water-soluble, non-ionic, hypoallergenic, myelogram-compatible radiological contrast medium.   lidocaine  (XYLOCAINE ) 2 % (with pres) injection 400 mg   pentafluoroprop-tetrafluoroeth (GEBAUERS) aerosol   midazolam  (VERSED ) 5 MG/5ML injection 0.5-2 mg    Make sure Flumazenil is available in the pyxis when using this  medication. If oversedation occurs, administer 0.2 mg IV over 15 sec. If after 45 sec no response, administer 0.2 mg again over 1 min; may repeat at 1 min intervals; not to exceed 4 doses (1 mg)  fentaNYL  (SUBLIMAZE ) injection 25-50 mcg    Make sure Narcan is available in the pyxis when using this medication. In the event of respiratory depression (RR< 8/min): Titrate NARCAN (naloxone) in increments of 0.1 to 0.2 mg IV at 2-3 minute intervals, until desired degree of reversal.   sodium chloride  flush (NS) 0.9 % injection 1 mL   ropivacaine  (PF) 2 mg/mL (0.2%) (NAROPIN ) injection 1 mL   dexamethasone  (DECADRON ) injection 10 mg   Medications administered: We administered iohexol , lidocaine , pentafluoroprop-tetrafluoroeth, midazolam , sodium chloride  flush, ropivacaine  (PF) 2 mg/mL (0.2%), and dexamethasone .  See the medical record for exact dosing, route, and time of administration.  Follow-up plan:   Return in about 2 weeks (around 06/06/2023) for (Face2F), (PPE).       Interventional Therapies  Risk  Complexity Considerations:   HTN  seizure disorder   NO MRI - Has surgical clips in brain.   Planned  Pending:   Diagnostic/therapeutic left cervical ESI #1    Under consideration:   Diagnostic/therapeutic left cervical ESI #1    Completed:   Diagnostic/therapeutic left IA glenohumeral/AC joint inj. x1 (01/03/2023) (100/100/100/100) Therapeutic right suprascapular RFA x1 (01/03/2023) (100/100/0/75)  Diagnostic/therapeutic right suprascapular NB x1 (11/29/2022) (100/100/50/50)    Completed by other providers:   Therapeutic right shoulder arthroscopy, extensive debridement, subacromial decompression, open biceps tenodesis (03/31/2021) by Dr. Kay Cummins [facility])  Diagnostic/therapeutic right subacromial bursa inj. x1 (08/10/2021) by Dr. Kay Cummins [facility])    Therapeutic  Palliative (PRN) options:   None established      Recent Visits Date Type Provider Dept  05/08/23 Office  Visit Tanya Glisson, MD [facility]  Showing recent visits within past 90 days and meeting all other requirements Today's Visits Date Type Provider Dept  05/23/23 Procedure visit Tanya Glisson, MD [facility]  Showing today's visits and meeting all other requirements Future Appointments Date Type Provider Dept  06/06/23 Appointment Tanya Glisson, MD [facility]  Showing future appointments within next 90 days and meeting all other requirements  Disposition: Discharge home  Discharge (Date  Time): 05/23/2023; 0940 hrs.   Primary Care Physician: Oley Bascom RAMAN, NP Location: [facility] Note by: Glisson [name] Tanya, MD (TTS technology used. I apologize for any typographical errors that were not detected and corrected.) Date: 05/23/2023; Time: 11:30 AM  Disclaimer:  Medicine is not an visual merchandiser. The only guarantee in medicine is that nothing is guaranteed. It is important to note that the decision to proceed with this intervention was based on the information collected from the patient. The Data and conclusions were drawn from the patient's questionnaire, the interview, and the physical examination. Because the information was provided in large part by the patient, it cannot be guaranteed that it has not been purposely or unconsciously manipulated. Every effort has been made to obtain as much relevant data as possible for this evaluation. It is important to note that the conclusions that lead to this procedure are derived in large part from the available data. Always take into account that the treatment will also be dependent on availability of resources and existing treatment guidelines, considered by other Pain Management Practitioners as being common knowledge and practice, at the time of the intervention. For Medico-Legal purposes, it is also important to point out that variation in procedural techniques and  pharmacological choices are the acceptable norm. The indications, contraindications, technique, and results of the above procedure should only be interpreted and judged by a Board-Certified Interventional Pain  Specialist with extensive familiarity and expertise in the same exact procedure and technique.

## 2023-05-25 ENCOUNTER — Telehealth: Payer: Self-pay | Admitting: *Deleted

## 2023-05-25 NOTE — Telephone Encounter (Signed)
 Patient returned call for F/U PPE call. Denies any concerns. I went over the pain diary and reinforeced how to fill out and the importance of bringing back at return visit on 06/06/2023. Instructed patient to call for any concerns.

## 2023-05-25 NOTE — Telephone Encounter (Signed)
 Attempted to call for post procedure follow-up. Message left.

## 2023-06-06 ENCOUNTER — Ambulatory Visit: Payer: 59 | Attending: Pain Medicine | Admitting: Pain Medicine

## 2023-06-06 ENCOUNTER — Encounter: Payer: Self-pay | Admitting: Pain Medicine

## 2023-06-06 VITALS — BP 143/93 | HR 88 | Temp 99.0°F | Resp 18 | Ht 67.0 in | Wt 215.0 lb

## 2023-06-06 DIAGNOSIS — M79602 Pain in left arm: Secondary | ICD-10-CM | POA: Insufficient documentation

## 2023-06-06 DIAGNOSIS — R937 Abnormal findings on diagnostic imaging of other parts of musculoskeletal system: Secondary | ICD-10-CM | POA: Insufficient documentation

## 2023-06-06 DIAGNOSIS — M50121 Cervical disc disorder at C4-C5 level with radiculopathy: Secondary | ICD-10-CM | POA: Diagnosis not present

## 2023-06-06 DIAGNOSIS — M5030 Other cervical disc degeneration, unspecified cervical region: Secondary | ICD-10-CM | POA: Diagnosis not present

## 2023-06-06 DIAGNOSIS — M5412 Radiculopathy, cervical region: Secondary | ICD-10-CM | POA: Insufficient documentation

## 2023-06-06 DIAGNOSIS — G8929 Other chronic pain: Secondary | ICD-10-CM | POA: Insufficient documentation

## 2023-06-06 DIAGNOSIS — M19012 Primary osteoarthritis, left shoulder: Secondary | ICD-10-CM | POA: Diagnosis not present

## 2023-06-06 DIAGNOSIS — M542 Cervicalgia: Secondary | ICD-10-CM | POA: Diagnosis not present

## 2023-06-06 DIAGNOSIS — Z09 Encounter for follow-up examination after completed treatment for conditions other than malignant neoplasm: Secondary | ICD-10-CM | POA: Diagnosis not present

## 2023-06-06 DIAGNOSIS — M4802 Spinal stenosis, cervical region: Secondary | ICD-10-CM | POA: Insufficient documentation

## 2023-06-06 DIAGNOSIS — G4486 Cervicogenic headache: Secondary | ICD-10-CM | POA: Diagnosis not present

## 2023-06-06 DIAGNOSIS — M25512 Pain in left shoulder: Secondary | ICD-10-CM | POA: Insufficient documentation

## 2023-06-06 NOTE — Progress Notes (Signed)
 PROVIDER NOTE: Information contained herein reflects review and annotations entered in association with encounter. Interpretation of such information and data should be left to medically-trained personnel. Information provided to patient can be located elsewhere in the medical record under Patient Instructions. Document created using STT-dictation technology, any transcriptional errors that may result from process are unintentional.    Patient: Michael Fleming  Service Category: E/M  Provider: Eric [name] Como, MD  DOB: April 07, 1971  DOS: 06/06/2023  Referring Provider: Oley Bascom RAMAN, NP  MRN: [id number]  Specialty: Interventional Pain Management  PCP: Oley Bascom RAMAN, NP  Type: Established Patient  Setting: Ambulatory outpatient    Location: Office  Delivery: Face-to-face     HPI  Michael Fleming, a 53 y.o. year old male, is here today because of his Chronic pain of left upper extremity [M79.602, G89.29]. Michael Fleming's primary complain today is Neck Pain and Shoulder Pain  Pertinent problems: 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); Tendinosis of rotator cuff (Right); Osteoarthritis of AC (acromioclavicular) joint (Right); Chronic pain syndrome; Chronic shoulder pain (1ry area of Pain) (Right); Partial tear of subscapularis tendon, sequela (Right); Traumatic rupture of biceps tendon, sequela (Right); Cervicogenic headache; DDD (degenerative disc disease), cervical; Cervical foraminal stenosis (Bilateral); Radicular shoulder pain; Cervical facet hypertrophy; Cervical radiculopathy at C6; Cervical disc disorder at C4-C5 level with radiculopathy; Chronic shoulder pain (Left); Chronic low back pain (Bilateral) w/o sciatica; Arthralgia of acromioclavicular joint (Right); Osteoarthritis of AC  (acromioclavicular) joint (Left); Arthralgia of lacromioclavicular joint (Left); Abnormal CT scan, cervical spine (12/07/2022); Chronic upper extremity pain (Left); Cervical radiculopathy (Left); and Cervicalgia on their pertinent problem list. Pain Assessment: Severity of Chronic pain is reported as a 10-Worst pain ever/10. Location: Shoulder Left/front and back of left shoulder down arm to below elbow. Onset: More than a month ago. Quality: Aching, Constant, Shooting, Nagging, Sore, Discomfort. Timing: Constant. Modifying factor(s): Denies. Vitals:  height is 5' 7 (1.702 m) and weight is 215 lb (97.5 kg). His temporal temperature is 99 F (37.2 C). His blood pressure is 143/93 (abnormal) and his pulse is 88. His respiration is 18 and oxygen saturation is 98%.  BMI: Estimated body mass index is 33.67 kg/m as calculated from the following:   Height as of this encounter: 5' 7 (1.702 m).   Weight as of this encounter: 215 lb (97.5 kg). Last encounter: 05/08/2023. Last procedure: 05/23/2023.  Reason for encounter: post-procedure evaluation and assessment.   Discussed the use of AI scribe software for clinical note transcription with the patient, who gave verbal consent to proceed.  History of Present Illness   The patient underwent a left cervical epidural steroid injection on December 31st for pain management. Initially, the procedure resulted in complete numbness and absence of pain. However, by January 3rd, the patient began to experience a gradual return of pain. By January 4th, the pain had returned to its pre-procedure intensity. The patient also expressed concern about a potential issue in his arm.  The pain appears to have components originating from both the neck and the shoulder. The patient's response to the cervical epidural steroid injection suggests that some of the pain may be due to swelling in the neck. However, the full return of pain indicates that the issue may be more complex  than initially thought, potentially requiring additional treatment.  The patient did not express  any other health concerns during this consultation.       Post-procedure evaluation   Procedure: Cervical Epidural Steroid injection (CESI) (Interlaminar) #1  Laterality: Left  Level: C7-T1 Imaging: Fluoroscopy-assisted DOS: 05/23/2023  Performed by: Eric Como, MD Anesthesia: Local anesthesia (1-2% Lidocaine ) Anxiolysis: IV Versed  2.0 mg Sedation: Moderate Sedation None required. No Fentanyl  administered.           Purpose: Diagnostic/Therapeutic Indications: Cervicalgia, cervical radicular pain, degenerative disc disease, severe enough to impact quality of life or function. 1. Cervical radiculopathy (Left)   2. Cervical radiculopathy at C6   3. Cervical foraminal stenosis (Bilateral)   4. Cervical disc disorder at C4-C5 level with radiculopathy   5. Chronic shoulder pain (Left)   6. Chronic upper extremity pain (Left)   7. DDD (degenerative disc disease), cervical   8. Radicular shoulder pain   9. Cervicalgia   10. Cervicogenic headache   11. Abnormal CT scan, cervical spine (12/07/2022)    NAS-11 score:   Pre-procedure: 7 /10   Post-procedure: 0-No pain/10     Effectiveness:  Initial hour after procedure: 100 %. Subsequent 4-6 hours post-procedure: 100 % (50 % relief for 3-4 days). Analgesia past initial 6 hours: 100 % until 05/27/23.  Returned to baseline on 05/31/23. Ongoing improvement:  Analgesic: The patient indicates having attained no long-term benefit from the cervical epidural steroid injection. Function: Back to baseline ROM: Back to baseline  Pharmacotherapy Assessment  Analgesic: No chronic opioid analgesics therapy prescribed by our practice. None MME/day: 0 mg/day   Monitoring: [place] PMP: PDMP reviewed during this encounter.       Pharmacotherapy: No side-effects or adverse reactions reported. Compliance: No problems identified. Effectiveness:  Clinically acceptable.  No notes on file  No results found for: CBDTHCR No results found for: D8THCCBX No results found for: D9THCCBX  UDS:  Summary  Date Value Ref Range Status  02/16/2022 Note  Final    Comment:    ==================================================================== Compliance Drug Analysis, Ur ==================================================================== Test                             Result       Flag       Units    NO DRUGS DETECTED. ==================================================================== Test                      Result    Flag   Units      Ref Range   Creatinine              28               mg/dL      >=79 ==================================================================== Declared Medications:  The flagging and interpretation on this report are based on the  following declared medications.  Unexpected results may arise from  inaccuracies in the declared medications.   **Note: The testing scope of this panel does not include the  following reported medications:   Amlodipine  (Norvasc )  Atorvastatin  (Lipitor)  Losartan  (Cozaar ) ==================================================================== For clinical consultation, please call [phone]. ====================================================================       ROS  Constitutional: Denies any fever or chills Gastrointestinal: No reported hemesis, hematochezia, vomiting, or acute GI distress Musculoskeletal: Denies any acute onset joint swelling, redness, loss of ROM, or weakness Neurological: No reported episodes of acute onset apraxia, aphasia, dysarthria, agnosia, amnesia, paralysis, loss of coordination, or loss of consciousness  Medication Review  amLODipine , atorvastatin , losartan , meloxicam ,  and sildenafil   History Review  Allergy: Michael Fleming has no known allergies. Drug: Michael Fleming  reports no history of drug use. Alcohol:  reports current  alcohol use. Tobacco:  reports that he has never smoked. He has never used smokeless tobacco. Social: Michael Fleming  reports that he has never smoked. He has never used smokeless tobacco. He reports current alcohol use. He reports that he does not use drugs. Medical:  has a past medical history of Allergy, Biceps tendon tear, Heart palpitations (12/2018), Hyperlipidemia, Hypertension, Hypokalemia (12/2018), and Seizures (HCC). Surgical: Michael Fleming  has a past surgical history that includes Brain surgery; Foot surgery; Knee arthroscopy (Left); Wrist surgery (Left); and Shoulder arthroscopy with subacromial decompression and bicep tendon repair (Right, 03/31/2021). Family: family history includes Healthy in his mother; Hypertension in his father.  Laboratory Chemistry Profile   Renal Lab Results  Component Value Date   BUN 10 04/10/2023   CREATININE 1.10 04/10/2023   BCR 9 04/10/2023   GFRAA 82 08/05/2019   GFRNONAA 71 08/05/2019    Hepatic Lab Results  Component Value Date   AST 31 04/10/2023   ALT 33 04/10/2023   ALBUMIN 4.4 04/10/2023   ALKPHOS 100 04/10/2023    Electrolytes Lab Results  Component Value Date   NA 139 04/10/2023   K 3.9 04/10/2023   CL 101 04/10/2023   CALCIUM  9.8 04/10/2023   MG 1.9 02/16/2022    Bone Lab Results  Component Value Date   VD25OH 24.4 (L) 08/05/2020   25OHVITD1 25 (L) 02/16/2022   25OHVITD2 2.0 02/16/2022   25OHVITD3 23 02/16/2022    Inflammation (CRP: Acute Phase) (ESR: Chronic Phase) Lab Results  Component Value Date   CRP 5 02/16/2022   ESRSEDRATE 17 02/16/2022         Note: Above Lab results reviewed.  Recent Imaging Review  DG PAIN CLINIC C-ARM 1-60 MIN NO REPORT Fluoro was used, but no Radiologist interpretation will be provided.  Please refer to NOTES tab for provider progress note. Note: Reviewed        Physical Exam  General appearance: Well nourished, well developed, and well hydrated. In no apparent acute  distress Mental status: Alert, oriented x 3 (person, place, & time)       Respiratory: No evidence of acute respiratory distress Eyes: PERLA Vitals: BP (!) 143/93 (Cuff Size: Normal)   Pulse 88   Temp 99 F (37.2 C) (Temporal)   Resp 18   Ht 5' 7 (1.702 m)   Wt 215 lb (97.5 kg)   SpO2 98%   BMI 33.67 kg/m  BMI: Estimated body mass index is 33.67 kg/m as calculated from the following:   Height as of this encounter: 5' 7 (1.702 m).   Weight as of this encounter: 215 lb (97.5 kg). Ideal: Ideal body weight: 66.1 kg (145 lb 11.6 oz) Adjusted ideal body weight: 78.7 kg (173 lb 6.9 oz)  Assessment   Diagnosis Status  1. Chronic upper extremity pain (Left)   2. Cervical radiculopathy (Left)   3. Radicular shoulder pain   4. Chronic shoulder pain (Left)   5. DDD (degenerative disc disease), cervical   6. Cervical disc disorder at C4-C5 level with radiculopathy   7. Abnormal CT scan, cervical spine (12/07/2022)   8. Postop check   9. Arthralgia of lacromioclavicular joint (Left)   10. Cervical foraminal stenosis (Bilateral)   11. Cervical radiculopathy at C6   12. Cervicalgia   13. Cervicogenic headache   14.  Osteoarthritis of AC (acromioclavicular) joint (Left)    Controlled Controlled Controlled   Updated Problems: No problems updated.  Plan of Care  Problem-specific:  Assessment and Plan    Cervical Radiculopathy A left cervical epidural steroid injection was administered on 05/23/2023. Initial numbness was followed by a return of pain on 05/26/2023, worsening to pre-injection levels, suggesting a cervical spine origin. Potential swelling and the need for further treatment were discussed. An MRI will provide more information. Repeating the epidural may be necessary if swelling is confirmed. Order an MRI of the cervical spine and consider repeating the cervical epidural steroid injection based on MRI results.  Left Shoulder Pain Reports of pain in the left shoulder  suggest it may contribute to overall discomfort, with a suspected shoulder origin. An MRI will provide more information. Order an MRI of the left shoulder.  Follow-up Review MRI results and schedule a follow-up appointment once results are available.       Michael Fleming has a current medication list which includes the following long-term medication(s): amlodipine , atorvastatin , losartan , and sildenafil .  Pharmacotherapy (Medications Ordered): No orders of the defined types were placed in this encounter.  Orders:  Orders Placed This Encounter  Procedures   MR CERVICAL SPINE WO CONTRAST    Patient presents with axial pain with possible radicular component. Please assist us  in identifying specific level(s) and laterality of any additional findings such as: 1. Facet (Zygapophyseal) joint DJD (Hypertrophy, space narrowing, subchondral sclerosis, and/or osteophyte formation) 2. DDD and/or IVDD (Loss of disc height, desiccation, gas patterns, osteophytes, endplate sclerosis, or Black disc disease) 3. Pars defects 4. Spondylolisthesis, spondylosis, and/or spondyloarthropathies (include Degree/Grade of displacement in mm) (stability) 5. Vertebral body Fractures (acute/chronic) (state percentage of collapse) 6. Demineralization (osteopenia/osteoporotic) 7. Bone pathology 8. Foraminal narrowing  9. Surgical changes 10. Central, Lateral Recess, and/or Foraminal Stenosis (include AP diameter of stenosis in mm) 11. Surgical changes (hardware type, status, and presence of fibrosis) 12. Modic Type Changes (MRI only) 13. IVDD (Disc bulge, protrusion, herniation, extrusion) (Level, laterality, extent)    Standing Status:   Future    Expiration Date:   09/04/2023    Scheduling Instructions:     Please make sure that the patient understands that this needs to be done as soon as possible. Never have the patient do the imaging just before the next appointment. Inform patient that having the  imaging done within the [facility] will expedite the availability of the results and will provide      imaging availability to the requesting physician. In addition inform the patient that the imaging order has an expiration date and will not be renewed if not done within the active period.    What is the patient's sedation requirement?:   No Sedation    Does the patient have a pacemaker or implanted devices?:   No    Preferred imaging location?:   [facility] (table limit-350lbs)    Call Results- Best Contact Number?:   [phone] [facility]    Radiology Contrast Protocol - do NOT remove file path:   \\[email or web address]   MR SHOULDER LEFT WO CONTRAST    Standing Status:   Future    Expiration Date:   09/04/2023    Scheduling Instructions:     Please make sure that the patient understands that this needs to be done as soon as possible. Never have the patient do the imaging just before the next appointment.  Inform patient that having the imaging done within the [facility] will expedite the availability of the results and will provide      imaging availability to the requesting physician. In addition inform the patient that the imaging order has an expiration date and will not be renewed if not done within the active period.    What is the patient's sedation requirement?:   No Sedation    Does the patient have a pacemaker or implanted devices?:   No    Preferred imaging location?:   [facility] (table limit-350lbs)    Call Results- Best Contact Number?:   [phone] [facility]    Radiology Contrast Protocol - do NOT remove file path:   \\[email or web address]   Nursing Instructions:    Please complete this patient's postprocedure evaluation.    Scheduling Instructions:     Please complete this patient's postprocedure  evaluation.   Follow-up plan:   Return for Eval-day (M,W), (F2F), for review of ordered tests (cervical and shoulder MRIs).      Interventional Therapies  Risk  Complexity Considerations:   HTN  seizure disorder   NO MRI - Has surgical clips in brain.   Planned  Pending:   Diagnostic/therapeutic left cervical ESI #1    Under consideration:   Diagnostic/therapeutic left cervical ESI #1    Completed:   Diagnostic/therapeutic left IA glenohumeral/AC joint inj. x1 (01/03/2023) (100/100/100/100) Therapeutic right suprascapular RFA x1 (01/03/2023) (100/100/0/75)  Diagnostic/therapeutic right suprascapular NB x1 (11/29/2022) (100/100/50/50)    Completed by other providers:   Therapeutic right shoulder arthroscopy, extensive debridement, subacromial decompression, open biceps tenodesis (03/31/2021) by Dr. Kay Cummins [facility])  Diagnostic/therapeutic right subacromial bursa inj. x1 (08/10/2021) by Dr. Kay Cummins [facility])    Therapeutic  Palliative (PRN) options:   None established      Recent Visits Date Type Provider Dept  05/23/23 Procedure visit Tanya Glisson, MD [facility]  05/08/23 Office Visit Tanya Glisson, MD [facility]  Showing recent visits within past 90 days and meeting all other requirements Today's Visits Date Type Provider Dept  06/06/23 Office Visit Tanya Glisson, MD [facility]  Showing today's visits and meeting all other requirements Future Appointments No visits were found meeting these conditions. Showing future appointments within next 90 days and meeting all other requirements  I discussed the assessment and treatment plan with the patient. The patient was provided an opportunity to ask questions and all were answered. The patient agreed with the plan and demonstrated an understanding of the instructions.  Patient advised to call back or seek an in-person evaluation if the symptoms or condition  worsens.  Duration of encounter: 30 minutes.  Total time on encounter, as per AMA guidelines included both the face-to-face and non-face-to-face time personally spent by the physician and/or other qualified health care professional(s) on the day of the encounter (includes time in activities that require the physician or other qualified health care professional and does not include time in activities normally performed by clinical staff). Physician's time may include the following activities when performed: Preparing to see the patient (e.g., pre-charting review of records, searching for previously ordered imaging, lab work, and nerve conduction tests) Review of prior analgesic pharmacotherapies. Reviewing PMP Interpreting ordered tests (e.g., lab work, imaging, nerve conduction tests) Performing post-procedure evaluations, including interpretation of diagnostic procedures Obtaining and/or reviewing separately obtained history Performing a medically appropriate examination and/or evaluation Counseling and educating the patient/family/caregiver Ordering medications, tests,  or procedures Referring and communicating with other health care professionals (when not separately reported) Documenting clinical information in the electronic or other health record Independently interpreting results (not separately reported) and communicating results to the patient/ family/caregiver Care coordination (not separately reported)  Note by: Eric [name] Como, MD Date: 06/06/2023; Time: 2:40 PM

## 2023-06-06 NOTE — Patient Instructions (Signed)
  ____________________________________________________________________________________________  Understanding MRI Findings           Facet Joint Pain Patterns      Nerve Root Pain Patterns (Dermatomes)    ____________________________________________________________________________________________

## 2023-06-07 ENCOUNTER — Telehealth: Payer: Self-pay

## 2023-06-07 ENCOUNTER — Telehealth: Payer: Self-pay | Admitting: Nurse Practitioner

## 2023-06-07 ENCOUNTER — Other Ambulatory Visit: Payer: Self-pay | Admitting: Pain Medicine

## 2023-06-07 DIAGNOSIS — M4802 Spinal stenosis, cervical region: Secondary | ICD-10-CM

## 2023-06-07 DIAGNOSIS — M5412 Radiculopathy, cervical region: Secondary | ICD-10-CM

## 2023-06-07 DIAGNOSIS — M542 Cervicalgia: Secondary | ICD-10-CM

## 2023-06-07 DIAGNOSIS — R937 Abnormal findings on diagnostic imaging of other parts of musculoskeletal system: Secondary | ICD-10-CM

## 2023-06-07 DIAGNOSIS — G4486 Cervicogenic headache: Secondary | ICD-10-CM

## 2023-06-07 DIAGNOSIS — M50121 Cervical disc disorder at C4-C5 level with radiculopathy: Secondary | ICD-10-CM

## 2023-06-07 DIAGNOSIS — M5030 Other cervical disc degeneration, unspecified cervical region: Secondary | ICD-10-CM

## 2023-06-07 NOTE — Telephone Encounter (Signed)
 Copied from CRM [id number]. Topic: Clinical - Medical Advice >> Jun 07, 2023  1:43 PM Jorie Newness J wrote: Reason for CRM: PT states he needs an MRI but the doctor that he was referred to, who had been giving him cortizone injections states he will not be able to do it because he has clips in his head but he states he's had an MRI 7done before with the clips in his head. He wants to know if he can be referred somewhere else for an MRI or if the MRI can be done in office. Pt is requesting a callback at [phone]

## 2023-06-07 NOTE — Telephone Encounter (Signed)
 This patient has metal in his head. He cannot have a cervical MRI. We went through this in July 2024. The order was changed to a CT and he had that done. The results are in the chart. Is it necessary for him to have another cervical CT?

## 2023-06-14 ENCOUNTER — Other Ambulatory Visit: Payer: Self-pay

## 2023-06-15 ENCOUNTER — Other Ambulatory Visit: Payer: Self-pay | Admitting: Nurse Practitioner

## 2023-06-15 ENCOUNTER — Other Ambulatory Visit: Payer: Self-pay

## 2023-06-22 ENCOUNTER — Ambulatory Visit: Payer: 59 | Attending: Pain Medicine | Admitting: Pain Medicine

## 2023-06-22 ENCOUNTER — Ambulatory Visit
Admission: RE | Admit: 2023-06-22 | Discharge: 2023-06-22 | Disposition: A | Discharge status: Home / Self Care | Payer: 59 | Source: Ambulatory Visit | Attending: Pain Medicine | Admitting: Pain Medicine

## 2023-06-22 VITALS — BP 138/90 | HR 93 | Temp 97.5°F | Resp 16 | Ht 67.0 in | Wt 216.0 lb

## 2023-06-22 DIAGNOSIS — M5412 Radiculopathy, cervical region: Secondary | ICD-10-CM

## 2023-06-22 DIAGNOSIS — R937 Abnormal findings on diagnostic imaging of other parts of musculoskeletal system: Secondary | ICD-10-CM

## 2023-06-22 DIAGNOSIS — M25512 Pain in left shoulder: Secondary | ICD-10-CM | POA: Insufficient documentation

## 2023-06-22 DIAGNOSIS — G8929 Other chronic pain: Secondary | ICD-10-CM | POA: Diagnosis not present

## 2023-06-22 DIAGNOSIS — M542 Cervicalgia: Secondary | ICD-10-CM

## 2023-06-22 DIAGNOSIS — M5030 Other cervical disc degeneration, unspecified cervical region: Secondary | ICD-10-CM

## 2023-06-22 DIAGNOSIS — M4802 Spinal stenosis, cervical region: Secondary | ICD-10-CM

## 2023-06-22 DIAGNOSIS — Z5189 Encounter for other specified aftercare: Secondary | ICD-10-CM | POA: Diagnosis not present

## 2023-06-22 DIAGNOSIS — M50121 Cervical disc disorder at C4-C5 level with radiculopathy: Secondary | ICD-10-CM | POA: Diagnosis not present

## 2023-06-22 DIAGNOSIS — G4486 Cervicogenic headache: Secondary | ICD-10-CM

## 2023-06-22 DIAGNOSIS — M19012 Primary osteoarthritis, left shoulder: Secondary | ICD-10-CM | POA: Insufficient documentation

## 2023-06-22 MED ORDER — LIDOCAINE HCL (PF) 2 % IJ SOLN
INTRAMUSCULAR | Status: AC
  Filled 2023-06-22: qty 10

## 2023-06-22 MED ORDER — DEXAMETHASONE SODIUM PHOSPHATE 10 MG/ML IJ SOLN
INTRAMUSCULAR | Status: AC
  Filled 2023-06-22: qty 1

## 2023-06-22 MED ORDER — MIDAZOLAM HCL 5 MG/5ML IJ SOLN
0.5000 mg | Freq: Once | INTRAMUSCULAR | Status: AC
  Administered 2023-06-22: 2 mg via INTRAVENOUS

## 2023-06-22 MED ORDER — ROPIVACAINE HCL 2 MG/ML IJ SOLN
1.0000 mL | Freq: Once | INTRAMUSCULAR | Status: AC
Start: 2023-06-22 — End: 2023-06-22
  Administered 2023-06-22: 1 mL via EPIDURAL

## 2023-06-22 MED ORDER — IOHEXOL 180 MG/ML  SOLN
10.0000 mL | Freq: Once | INTRAMUSCULAR | Status: AC
  Administered 2023-06-22: 10 mL via EPIDURAL

## 2023-06-22 MED ORDER — IOHEXOL 180 MG/ML  SOLN
INTRAMUSCULAR | Status: AC
  Filled 2023-06-22: qty 10

## 2023-06-22 MED ORDER — MIDAZOLAM HCL 5 MG/5ML IJ SOLN
INTRAMUSCULAR | Status: AC
  Filled 2023-06-22: qty 5

## 2023-06-22 MED ORDER — DEXAMETHASONE SODIUM PHOSPHATE 10 MG/ML IJ SOLN
10.0000 mg | Freq: Once | INTRAMUSCULAR | Status: AC
Start: 2023-06-22 — End: 2023-06-22
  Administered 2023-06-22: 10 mg

## 2023-06-22 MED ORDER — SODIUM CHLORIDE (PF) 0.9 % IJ SOLN
INTRAMUSCULAR | Status: AC
  Filled 2023-06-22: qty 10

## 2023-06-22 MED ORDER — LIDOCAINE HCL 2 % IJ SOLN
20.0000 mL | Freq: Once | INTRAMUSCULAR | Status: AC
Start: 2023-06-22 — End: 2023-06-22
  Administered 2023-06-22: 100 mg

## 2023-06-22 MED ORDER — PENTAFLUOROPROP-TETRAFLUOROETH EX AERO
INHALATION_SPRAY | Freq: Once | CUTANEOUS | Status: AC
Start: 2023-06-22 — End: 2023-06-22
  Administered 2023-06-22: 30 via TOPICAL

## 2023-06-22 MED ORDER — ROPIVACAINE HCL 2 MG/ML IJ SOLN
INTRAMUSCULAR | Status: AC
  Filled 2023-06-22: qty 20

## 2023-06-22 MED ORDER — FENTANYL CITRATE (PF) 100 MCG/2ML IJ SOLN
INTRAMUSCULAR | Status: AC
  Filled 2023-06-22: qty 2

## 2023-06-22 MED ORDER — SODIUM CHLORIDE 0.9% FLUSH
1.0000 mL | Freq: Once | INTRAVENOUS | Status: AC
  Administered 2023-06-22: 1 mL

## 2023-06-22 MED ORDER — FENTANYL CITRATE (PF) 100 MCG/2ML IJ SOLN: 25.0000 ug | INTRAMUSCULAR | Status: DC | PRN

## 2023-06-22 NOTE — Progress Notes (Signed)
Safety precautions to be maintained throughout the outpatient stay will include: orient to surroundings, keep bed in low position, maintain call bell within reach at all times, provide assistance with transfer out of bed and ambulation.

## 2023-06-22 NOTE — Patient Instructions (Signed)

## 2023-06-22 NOTE — Progress Notes (Addendum)
 PROVIDER NOTE: Interpretation of information contained herein should be left to medically-trained personnel. Specific patient instructions are provided elsewhere under "Patient Instructions" section of medical record. This document was created in part using STT-dictation technology, any transcriptional errors that may result from this process are unintentional.  Patient: Michael Fleming Type: Established DOB: [birth year] MRN: [id number] PCP: Ivonne Andrew, NP  Service: Procedure DOS: 06/22/2023 Setting: Ambulatory Location: Ambulatory outpatient facility Delivery: Face-to-face Provider: Oswaldo Done, MD Specialty: Interventional Pain Management Specialty designation: 09 Location: Outpatient facility Ref. Prov.: Ivonne Andrew, NP       Interventional Therapy   Procedure: Cervical Epidural Steroid injection (CESI) (Interlaminar) #1  Laterality: Left  Level: C7-T1 Imaging: Fluoroscopy-assisted DOS: 06/22/2023  Performed by: Delano Metz, MD Anesthesia: Local anesthesia (1-2% Lidocaine) Anxiolysis: IV Versed 2.0 mg Sedation: Moderate Sedation None required. No Fentanyl administered.           Purpose: Diagnostic/Therapeutic Indications: Cervicalgia, cervical radicular pain, degenerative disc disease, severe enough to impact quality of life or function. 1. Radicular shoulder pain   2. Cervical radiculopathy (Left)   3. Cervical radiculopathy at C6   4. Cervicalgia   5. Cervical foraminal stenosis (Bilateral)   6. Cervical disc disorder at C4-C5 level with radiculopathy   7. Abnormal CT scan, cervical spine (12/07/2022)   8. DDD (degenerative disc disease), cervical   9. Chronic shoulder pain (Left)   10. Cervicogenic headache    NAS-11 score:   Pre-procedure: 7 /10   Post-procedure: 0-No pain/10      Position  Prep  Materials:  Location setting: Procedure suite Position: Prone, on modified reverse trendelenburg to facilitate breathing, with head in  head-cradle. Pillows positioned under chest (below chin-level) with cervical spine flexed. Safety Precautions: Patient was assessed for positional comfort and pressure points before starting the procedure. Prepping solution: DuraPrep (Iodine Povacrylex [0.7% available iodine] and Isopropyl Alcohol, 74% w/w) Prep Area: Entire  cervicothoracic region Approach: percutaneous, paramedial Intended target: Posterior cervical epidural space Materials Procedure:  Tray: Epidural Needle(s): Epidural (Tuohy) Qty: 1 Length: (90mm) 3.5-inch Gauge: 17G   H&P (Pre-op Assessment):  [name] is a 53 y.o. (year old), male patient, seen today for interventional treatment. He  has a past surgical history that includes Brain surgery; Foot surgery; Knee arthroscopy (Left); Wrist surgery (Left); and Shoulder arthroscopy with subacromial decompression and bicep tendon repair (Right, 03/31/2021). [name] has a current medication list which includes the following prescription(s): amlodipine, atorvastatin, losartan, meloxicam, and sildenafil, and the following Facility-Administered Medications: fentanyl. His primarily concern today is the Neck Pain  Initial Vital Signs:  Pulse/HCG Rate: 93ECG Heart Rate: 82 (NSR) Temp: 98.5 F (36.9 C) Resp: 16 BP: 129/85 SpO2: 98 %  BMI: Estimated body mass index is 33.83 kg/m as calculated from the following:   Height as of this encounter: 5\' 7"  (1.702 m).   Weight as of this encounter: 216 lb (98 kg).  Risk Assessment: Allergies: Reviewed. He has no known allergies.  Allergy Precautions: None required Coagulopathies: Reviewed. None identified.  Blood-thinner therapy: None at this time Active Infection(s): Reviewed. None identified. [name] is afebrile  Site Confirmation: [name] was asked to confirm the procedure and laterality before marking the site Procedure checklist: Completed Consent: Before the procedure and under the influence of no sedative(s),  amnesic(s), or anxiolytics, the patient was informed of the treatment options, risks and possible complications. To fulfill our ethical and legal obligations, as recommended by the American Medical Association's Code of Ethics,  I have informed the patient of my clinical impression; the nature and purpose of the treatment or procedure; the risks, benefits, and possible complications of the intervention; the alternatives, including doing nothing; the risk(s) and benefit(s) of the alternative treatment(s) or procedure(s); and the risk(s) and benefit(s) of doing nothing. The patient was provided information about the general risks and possible complications associated with the procedure. These may include, but are not limited to: failure to achieve desired goals, infection, bleeding, organ or nerve damage, allergic reactions, paralysis, and death. In addition, the patient was informed of those risks and complications associated to Spine-related procedures, such as failure to decrease pain; infection (i.e.: Meningitis, epidural or intraspinal abscess); bleeding (i.e.: epidural hematoma, subarachnoid hemorrhage, or any other type of intraspinal or peri-dural bleeding); organ or nerve damage (i.e.: Any type of peripheral nerve, nerve root, or spinal cord injury) with subsequent damage to sensory, motor, and/or autonomic systems, resulting in permanent pain, numbness, and/or weakness of one or several areas of the body; allergic reactions; (i.e.: anaphylactic reaction); and/or death. Furthermore, the patient was informed of those risks and complications associated with the medications. These include, but are not limited to: allergic reactions (i.e.: anaphylactic or anaphylactoid reaction(s)); adrenal axis suppression; blood sugar elevation that in diabetics may result in ketoacidosis or comma; water retention that in patients with history of congestive heart failure may result in shortness of breath, pulmonary edema, and  decompensation with resultant heart failure; weight gain; swelling or edema; medication-induced neural toxicity; particulate matter embolism and blood vessel occlusion with resultant organ, and/or nervous system infarction; and/or aseptic necrosis of one or more joints. Finally, the patient was informed that Medicine is not an exact science; therefore, there is also the possibility of unforeseen or unpredictable risks and/or possible complications that may result in a catastrophic outcome. The patient indicated having understood very clearly. We have given the patient no guarantees and we have made no promises. Enough time was given to the patient to ask questions, all of which were answered to the patient's satisfaction. Mr. Ballo has indicated that he wanted to continue with the procedure. Attestation: I, the ordering provider, attest that I have discussed with the patient the benefits, risks, side-effects, alternatives, likelihood of achieving goals, and potential problems during recovery for the procedure that I have provided informed consent. Date  Time: 06/22/2023  9:55 AM   Pre-Procedure Preparation:  Monitoring: As per clinic protocol. Respiration, ETCO2, SpO2, BP, heart rate and rhythm monitor placed and checked for adequate function Safety Precautions: Patient was assessed for positional comfort and pressure points before starting the procedure. Time-out: I initiated and conducted the "Time-out" before starting the procedure, as per protocol. The patient was asked to participate by confirming the accuracy of the "Time Out" information. Verification of the correct person, site, and procedure were performed and confirmed by me, the nursing staff, and the patient. "Time-out" conducted as per Joint Commission's Universal Protocol (UP.01.01.01). Time: 1116 Start Time: 1116 hrs.  Description  Narrative of Procedure:          Rationale (medical necessity): procedure needed and proper for the  diagnosis and/or treatment of the patient's medical symptoms and needs. Start Time: 1116 hrs. Safety Precautions: Aspiration looking for blood return was conducted prior to all injections. At no point did we inject any substances, as a needle was being advanced. No attempts were made at seeking any paresthesias. Safe injection practices and needle disposal techniques used. Medications properly checked for expiration dates. SDV (single  dose vial) medications used. Description of procedure: Protocol guidelines were followed. The patient was assisted into a comfortable position. The target area was identified and the area prepped in the usual manner. Skin & deeper tissues infiltrated with local anesthetic. Appropriate amount of time allowed to pass for local anesthetics to take effect. Using fluoroscopic guidance, the epidural needle was introduced through the skin, ipsilateral to the reported pain, and advanced to the target area. Posterior laminar os was contacted and the needle walked caudad, until the lamina was cleared. The ligamentum flavum was engaged and the epidural space identified using "loss-of-resistance technique" with 2-3 ml of PF-NaCl (0.9% NSS), in a 5cc dedicated LOR syringe. (See "Imaging guidance" below for use of contrast details.) Once proper needle placement was secured, and negative aspiration confirmed, the solution was injected in intermittent fashion, asking for systemic symptoms every 0.5cc. The needles were then removed and the area cleansed, making sure to leave some of the prepping solution back to take advantage of its long term bactericidal properties.  Vitals:   06/22/23 0954 06/22/23 1115 06/22/23 1121 06/22/23 1131  BP: 129/85 (!) 139/94 132/87 (!) 138/90  Pulse: 93     Resp: 16 18 18 16   Temp: 98.5 F (36.9 C)   (!) 97.5 F (36.4 C)  TempSrc:    Temporal  SpO2: 98% 100% 100% 96%  Weight: 216 lb (98 kg)     Height: 5\' 7"  (1.702 m)        End Time: 1121  hrs.  Imaging Guidance (Spinal):          Type of Imaging Technique: Fluoroscopy Guidance (Spinal) Indication(s): Fluoroscopy guidance for needle placement to enhance accuracy in procedures requiring precise needle localization for targeted delivery of medication in or near specific anatomical locations not easily accessible without such real-time imaging assistance. Exposure Time: Please see nurses notes. Contrast: Before injecting any contrast, we confirmed that the patient did not have an allergy to iodine, shellfish, or radiological contrast. Once satisfactory needle placement was completed at the desired level, radiological contrast was injected. Contrast injected under live fluoroscopy. No contrast complications. See chart for type and volume of contrast used. Fluoroscopic Guidance: I was personally present during the use of fluoroscopy. "Tunnel Vision Technique" used to obtain the best possible view of the target area. Parallax error corrected before commencing the procedure. "Direction-depth-direction" technique used to introduce the needle under continuous pulsed fluoroscopy. Once target was reached, antero-posterior, oblique, and lateral fluoroscopic projection used confirm needle placement in all planes. Images permanently stored in EMR. Interpretation: I personally interpreted the imaging intraoperatively. Adequate needle placement confirmed in multiple planes. Appropriate spread of contrast into desired area was observed. No evidence of afferent or efferent intravascular uptake. No intrathecal or subarachnoid spread observed. Permanent images saved into the patient's record.  Post-operative Assessment:  Post-procedure Vital Signs:  Pulse/HCG Rate: 9384 Temp: (!) 97.5 F (36.4 C) Resp: 16 BP: (!) 138/90 SpO2: 96 %  EBL: None  Complications: No immediate post-treatment complications observed by team, or reported by patient.  Note: The patient tolerated the entire procedure well. A  repeat set of vitals were taken after the procedure and the patient was kept under observation following institutional policy, for this type of procedure. Post-procedural neurological assessment was performed, showing return to baseline, prior to discharge. The patient was provided with post-procedure discharge instructions, including a section on how to identify potential problems. Should any problems arise concerning this procedure, the patient was given instructions to immediately  contact us, at any time, without hesitation. In any case, we plan to contact the patient by telephone for a follow-up status report regarding this interventional procedure.  Comments:  No additional relevant information.  Plan of Care (POC)  Orders:  Orders Placed This Encounter  Procedures   Cervical Epidural Injection    Indication(s): Radiculitis and cervicalgia associated with cervical degenerative disc disease. Position: Prone Imaging guidance: Fluoroscopy required. Contrast required unless contraindicated by allergy or severe CKD. Equipment & Materials: Epidural tray & needle.    Scheduling Instructions:     Procedure: Cervical Epidural Steroid Injection/Block     Planned Level(s): C7-T1     Laterality: Left-sided     Anxiolysis: Patient's choice.     Timeframe: Today    Where will this procedure be performed?:   [facility]             by Dr. Conception Oms [facility] 1-60 MIN NO REPORT    Intraoperative interpretation by procedural physician at [facility].    Standing Status:   Standing    Number of Occurrences:   1    Reason for exam::   Assistance in needle guidance and placement for procedures requiring needle placement in or near specific anatomical locations not easily accessible without such assistance.   Informed Consent Details: Physician/Practitioner Attestation; Transcribe to consent form and obtain patient signature    Nursing instructions: Transcribe to consent  form and obtain patient signature. Always confirm laterality of pain with Mr. Heindl, before procedure.    Physician/Practitioner attestation of informed consent for procedure/surgical case:   I, the physician/practitioner, attest that I have discussed with the patient the benefits, risks, side effects, alternatives, likelihood of achieving goals and potential problems during recovery for the procedure that I have provided informed consent.    Procedure:   Cervical Epidural Steroid Injection (CESI) under fluoroscopic guidance    Physician/Practitioner performing the procedure:   Nivea Wojdyla A. Laban Emperor MD    Indication/Reason:   Indications: Cervicalgia (neck pain), cervical radicular pain, radiculitis (arm/shoulder pain, numbness, and/or weakness), degenerative disc disease, severe enough to greatly impact quality of life or function.   Provide equipment / supplies at bedside    Procedural tray: Epidural Tray (Disposable  single use) Skin infiltration needle: Regular 1.5-in, 25-G, (x1) Block needle size: Regular standard Catheter: No catheter required    Standing Status:   Standing    Number of Occurrences:   1    Specify:   Epidural Tray   Saline lock IV    Have LR [phone] mL available and administer at 125 mL/hr if patient becomes hypotensive.    Standing Status:   Standing    Number of Occurrences:   1   Chronic Opioid Analgesic:   No chronic opioid analgesics therapy prescribed by our practice. None MME/day: 0 mg/day   Medications ordered for procedure: Meds ordered this encounter  Medications   iohexol (OMNIPAQUE) 180 MG/ML injection 10 mL    Must be Myelogram-compatible. If not available, you may substitute with a water-soluble, non-ionic, hypoallergenic, myelogram-compatible radiological contrast medium.   lidocaine (XYLOCAINE) 2 % (with pres) injection 400 mg   pentafluoroprop-tetrafluoroeth (GEBAUERS) aerosol   midazolam (VERSED) 5 MG/5ML injection 0.5-2 mg    Make sure  Flumazenil is available in the pyxis when using this medication. If oversedation occurs, administer 0.2 mg IV over 15 sec. If after 45 sec no response, administer 0.2 mg again over 1 min; may repeat  at 1 min intervals; not to exceed 4 doses (1 mg)   fentaNYL (SUBLIMAZE) injection 25-50 mcg    Make sure Narcan is available in the pyxis when using this medication. In the event of respiratory depression (RR< 8/min): Titrate NARCAN (naloxone) in increments of 0.1 to 0.2 mg IV at 2-3 minute intervals, until desired degree of reversal.   sodium chloride flush (NS) 0.9 % injection 1 mL   ropivacaine (PF) 2 mg/mL (0.2%) (NAROPIN) injection 1 mL   dexamethasone (DECADRON) injection 10 mg   Medications administered: We administered iohexol, lidocaine, pentafluoroprop-tetrafluoroeth, midazolam, sodium chloride flush, ropivacaine (PF) 2 mg/mL (0.2%), and dexamethasone.  See the medical record for exact dosing, route, and time of administration.  Follow-up plan:   Return in about 2 weeks (around 07/06/2023) for (Face2F), (PPE).       Interventional Therapies  Risk  Complexity Considerations:   HTN  seizure disorder   NO MRI - Has surgical clips in brain.   Planned  Pending:   Diagnostic/therapeutic left cervical ESI #1    Under consideration:   Diagnostic/therapeutic left cervical ESI #1    Completed:   Diagnostic/therapeutic left IA glenohumeral/AC joint inj. x1 (01/03/2023) (100/100/100/100) Therapeutic right suprascapular RFA x1 (01/03/2023) (100/100/0/75)  Diagnostic/therapeutic right suprascapular NB x1 (11/29/2022) (100/100/50/50)    Completed by other providers:   Therapeutic right shoulder arthroscopy, extensive debridement, subacromial decompression, open biceps tenodesis (03/31/2021) by Dr. Gershon Mussel [facility])  Diagnostic/therapeutic right subacromial bursa inj. x1 (08/10/2021) by Dr. Gershon Mussel [facility])    Therapeutic  Palliative (PRN) options:   None established     Recent  Visits Date Type Provider Dept  06/06/23 Office Visit Delano Metz, MD [facility]  05/23/23 Procedure visit Delano Metz, MD [facility]  05/08/23 Office Visit Delano Metz, MD [facility]  Showing recent visits within past 90 days and meeting all other requirements Today's Visits Date Type Provider Dept  06/22/23 Procedure visit Delano Metz, MD [facility]  Showing today's visits and meeting all other requirements Future Appointments Date Type Provider Dept  07/06/23 Appointment Delano Metz, MD [facility]  Showing future appointments within next 90 days and meeting all other requirements  Disposition: Discharge home  Discharge (Date  Time): 06/22/2023; 1139 hrs.   Primary Care Physician: Ivonne Andrew, NP Location: [facility] Note by: Oswaldo Done, MD (TTS technology used. I apologize for any typographical errors that were not detected and corrected.) Date: 06/22/2023; Time: 12:23 PM  Disclaimer:  Medicine is not an Visual merchandiser. The only guarantee in medicine is that nothing is guaranteed. It is important to note that the decision to proceed with this intervention was based on the information collected from the patient. The Data and conclusions were drawn from the patient's questionnaire, the interview, and the physical examination. Because the information was provided in large part by the patient, it cannot be guaranteed that it has not been purposely or unconsciously manipulated. Every effort has been made to obtain as much relevant data as possible for this evaluation. It is important to note that the conclusions that lead to this procedure are derived in large part from the available data. Always take into account that the treatment will also be dependent on availability of resources and existing treatment guidelines, considered by other Pain Management  Practitioners as being common knowledge and practice, at the time of the intervention. For Medico-Legal purposes, it is also important to point out that  variation in procedural techniques and pharmacological choices are the acceptable norm. The indications, contraindications, technique, and results of the above procedure should only be interpreted and judged by a Board-Certified Interventional Pain Specialist with extensive familiarity and expertise in the same exact procedure and technique.

## 2023-06-23 ENCOUNTER — Telehealth: Payer: Self-pay | Admitting: *Deleted

## 2023-06-23 NOTE — Telephone Encounter (Signed)
 No problems post procedure.

## 2023-07-05 NOTE — Progress Notes (Addendum)
 PROVIDER NOTE: Information contained herein reflects review and annotations entered in association with encounter. Interpretation of such information and data should be left to medically-trained personnel. Information provided to patient can be located elsewhere in the medical record under "Patient Instructions". Document created using STT-dictation technology, any transcriptional errors that may result from process are unintentional.    Patient: Michael Fleming  Service Category: E/M  Provider: Oswaldo Done, MD  DOB: 1970/10/13  DOS: 07/06/2023  Referring Provider: Ivonne Andrew, NP  MRN: [id number]  Specialty: Interventional Pain Management  PCP: Michael Andrew, NP  Type: Established Patient  Setting: Ambulatory outpatient    Location: Office  Delivery: Face-to-face     HPI  Michael Fleming, a 53 y.o. year old male, is here today because of his Radicular pain of shoulder [M54.12]. Michael Fleming primary complain today is Arm Pain (left)  Pertinent problems: 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); Tendinosis of rotator cuff (Right); Osteoarthritis of AC (acromioclavicular) joint (Right); Chronic pain syndrome; Chronic shoulder pain (1ry area of Pain) (Right); Partial tear of subscapularis tendon, sequela (Right); Traumatic rupture of biceps tendon, sequela (Right); Cervicogenic headache; DDD (degenerative disc disease), cervical; Cervical foraminal stenosis (Bilateral); Radicular shoulder pain; Cervical facet hypertrophy; Cervical radiculopathy at C6; Cervical disc disorder at C4-C5 level with radiculopathy; Chronic shoulder pain (Left); 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 Pain Assessment: Severity of Chronic pain is reported as a 6 /10. Location: Shoulder Left/side of left shouldwer and will rotate around to chest area above breast. Onset: More than a month ago. Quality: Aching, Constant, Burning, Stabbing, Nagging. Timing: Constant. Modifying factor(s): rest. Vitals:  height is 5\' 7"  (1.702 m) and weight is 215 lb (97.5 kg). His temperature is 98.1 F (36.7 C). His blood pressure is 139/94 (abnormal) and his pulse is 97. His respiration is 16 and oxygen saturation is 99%.  BMI: Estimated body mass index is 33.67 kg/m as calculated from the following:   Height as of this encounter: 5\' 7"  (1.702 m).   Weight as of this encounter: 215 lb (97.5 kg). Last encounter: 06/06/2023. Last procedure: 06/22/2023.  Reason for encounter: post-procedure evaluation and assessment.   Discussed the use of AI scribe software for clinical note transcription with the patient, who gave verbal consent to proceed.  History of Present Illness   [name] is a 53 year old male who presents for follow-up after a cervical epidural steroid injection.  Following the cervical epidural steroid injection, his neck pain and headaches have resolved, indicating a positive response to the treatment.  Despite the resolution of neck pain and headaches, he continues to experience shoulder pain. The pain radiates from the anterior and lateral portion of the left shoulder down to the upper part of the arm, reaching about mid forearm. This pain remains unchanged from prior to the injection.  He also describes persistent numbness in his fingers, which has not improved since the injection. The numbness is consistent with his  symptoms before the procedure.      Post-procedure evaluation   Procedure:  Cervical Epidural Steroid injection (CESI) (Interlaminar) #1  Laterality: Left  Level: C7-T1 Imaging: Fluoroscopy-assisted DOS: 06/22/2023  Performed by: Delano Metz, MD Anesthesia: Local anesthesia (1-2% Lidocaine) Anxiolysis: IV Versed         Sedation: Moderate Sedation                         Purpose: Diagnostic/Therapeutic Indications: Cervicalgia, cervical radicular pain, degenerative disc disease, severe enough to impact quality of life or function. 1. Radicular shoulder pain   2. Cervical radiculopathy (Left)   3. Cervical radiculopathy at C6   4. Cervicalgia   5. Cervical foraminal stenosis (Bilateral)   6. Cervical disc disorder at C4-C5 level with radiculopathy   7. Abnormal CT scan, cervical spine (12/07/2022)   8. DDD (degenerative disc disease), cervical   9. Chronic shoulder pain (Left)   10. Cervicogenic headache    NAS-11 score:   Pre-procedure: 7 /10   Post-procedure: 0-No pain/10     Effectiveness:  Initial hour after procedure: 100 %. Subsequent 4-6 hours post-procedure: 100 %. Analgesia past initial 6 hours: 0 %.  0% relief of the pain in the left upper extremity.  However, he has attained 100% relief of the cervicogenic headaches and the neck pain.. Ongoing improvement:  Analgesic: 100% ongoing relief of the neck pain and headaches with no long-term benefit in the left upper extremity. Function: Somewhat improved ROM: Somewhat improved  Pharmacotherapy Assessment  Analgesic: No chronic opioid analgesics therapy prescribed by our practice. None MME/day: 0 mg/day   Monitoring: [place] PMP: PDMP reviewed during this encounter.       Pharmacotherapy: No side-effects or adverse reactions reported. Compliance: No problems identified. Effectiveness: Clinically acceptable.  No notes on file  No results found for: "CBDTHCR" No results found for: "D8THCCBX" No results found  for: "D9THCCBX"  UDS:  Summary  Date Value Ref Range Status  02/16/2022 Note  Final    Comment:    ==================================================================== Compliance Drug Analysis, Ur ==================================================================== Test                             Result       Flag       Units    NO DRUGS DETECTED. ==================================================================== Test                      Result    Flag   Units      Ref Range   Creatinine              28               mg/dL      >=16 ==================================================================== Declared Medications:  The flagging and interpretation on this report are based on the  following declared medications.  Unexpected results may arise from  inaccuracies in the declared medications.   **Note: The testing scope of this panel does not include the  following reported medications:   Amlodipine (Norvasc)  Atorvastatin (Lipitor)  Losartan (Cozaar) ==================================================================== For clinical consultation, please call [phone]. ====================================================================       ROS  Constitutional: Denies any fever or chills Gastrointestinal: No reported hemesis, hematochezia, vomiting, or acute GI distress Musculoskeletal: Denies any acute onset joint swelling, redness, loss of ROM, or weakness Neurological: No reported episodes of acute onset apraxia, aphasia, dysarthria, agnosia, amnesia, paralysis, loss of coordination, or loss of consciousness  Medication Review  amLODipine,  atorvastatin, losartan, predniSONE, and sildenafil  History Review  Allergy: Michael Fleming has no known allergies. Drug: Michael Fleming  reports no history of drug use. Alcohol:  reports current alcohol use. Tobacco:  reports that he has never smoked. He has never used smokeless tobacco. Social: Michael Fleming  reports that he has  never smoked. He has never used smokeless tobacco. He reports current alcohol use. He reports that he does not use drugs. Medical:  has a past medical history of Allergy, Biceps tendon tear, Heart palpitations (12/2018), Hyperlipidemia, Hypertension, Hypokalemia (12/2018), and Seizures (HCC). Surgical: Michael Fleming  has a past surgical history that includes Brain surgery; Foot surgery; Knee arthroscopy (Left); Wrist surgery (Left); and Shoulder arthroscopy with subacromial decompression and bicep tendon repair (Right, 03/31/2021). Family: family history includes Healthy in his mother; Hypertension in his father.  Laboratory Chemistry Profile   Renal Lab Results  Component Value Date   BUN 10 04/10/2023   CREATININE 1.10 04/10/2023   BCR 9 04/10/2023   GFRAA 82 08/05/2019   GFRNONAA 71 08/05/2019    Hepatic Lab Results  Component Value Date   AST 31 04/10/2023   ALT 33 04/10/2023   ALBUMIN 4.4 04/10/2023   ALKPHOS 100 04/10/2023    Electrolytes Lab Results  Component Value Date   NA 139 04/10/2023   K 3.9 04/10/2023   CL 101 04/10/2023   CALCIUM 9.8 04/10/2023   MG 1.9 02/16/2022    Bone Lab Results  Component Value Date   VD25OH 24.4 (L) 08/05/2020   25OHVITD1 25 (L) 02/16/2022   25OHVITD2 2.0 02/16/2022   25OHVITD3 23 02/16/2022    Inflammation (CRP: Acute Phase) (ESR: Chronic Phase) Lab Results  Component Value Date   CRP 5 02/16/2022   ESRSEDRATE 17 02/16/2022         Note: Above Lab results reviewed.  Recent Imaging Review  DG PA[facility] C-ARM 1-60 MIN NO REPORT Fluoro was used, but no Radiologist interpretation will be provided.  Please refer to "NOTES" tab for provider progress note. Note: Reviewed        Physical Exam  General appearance: Well nourished, well developed, and well hydrated. In no apparent acute distress Mental status: Alert, oriented x 3 (person, place, & time)       Respiratory: No evidence of acute respiratory distress Eyes:  PERLA Vitals: BP (!) 139/94   Pulse 97   Temp 98.1 F (36.7 C)   Resp 16   Ht 5\' 7"  (1.702 m)   Wt 215 lb (97.5 kg)   SpO2 99%   BMI 33.67 kg/m  BMI: Estimated body mass index is 33.67 kg/m as calculated from the following:   Height as of this encounter: 5\' 7"  (1.702 m).   Weight as of this encounter: 215 lb (97.5 kg). Ideal: Ideal body weight: 66.1 kg (145 lb 11.6 oz) Adjusted ideal body weight: 78.7 kg (173 lb 6.9 oz)  Cervical Physical Exam: As stated above, there has been improvement in range of motion and function after the first cervical epidural steroid injection.  However, he continues to have decreased range of motion with impairment in completing full range of motion with associated pain.  He has attained 100% ongoing relief of the axial cervical pain as well as the cervicogenic headache component of this problem.  He continues to have persistent left cervical radicular symptoms and for this reason we have scheduled the patient to have a second cervical epidural to take advantage of the cumulative effect of  this therapy.  Assessment   Diagnosis Status  1. Radicular shoulder pain   2. Cervicalgia   3. Cervicogenic headache   4. Chronic shoulder pain (Left)   5. Postop check    Persistent Resolved Resolved   Updated Problems: No problems updated.  Plan of Care  Problem-specific:  Assessment and Plan    Cervical Radiculopathy Neck pain and headaches have resolved following a cervical epidural steroid injection. However, pain and numbness persist in the left shoulder and upper arm, extending to the mid forearm, consistent with cervical radiculopathy likely due to ongoing inflammation. The initial injection likely reduced some inflammation, but further treatment is necessary. Plan to administer a second cervical epidural steroid injection. If symptoms persist after three injections, consider neurosurgical consultation.      Michael Fleming has a current medication  list which includes the following long-term medication(s): amlodipine, losartan, sildenafil, and atorvastatin.  Pharmacotherapy (Medications Ordered): No orders of the defined types were placed in this encounter.  Orders:  Orders Placed This Encounter  Procedures   Cervical Epidural Injection    Sedation: Patient's choice. Purpose: Diagnostic/Therapeutic Indication(s): Radiculitis and cervicalgia associater with cervical degenerative disc disease.    Standing Status:   Future    Expiration Date:   10/03/2023    Scheduling Instructions:     Procedure: Cervical Epidural Steroid Injection/Block     Level(s): C7-T1     Laterality: Left-sided     Timeframe: As soon as schedule allows    Where will this procedure be performed?:   [facility]             by Dr. Laban Emperor   Nursing Instructions:    Please complete this patient's postprocedure evaluation.    Scheduling Instructions:     Please complete this patient's postprocedure evaluation.   Follow-up plan:   Return for [facility]): (L) CESI #2 IV sedation.      Interventional Therapies  Risk  Complexity Considerations:   HTN  seizure disorder   NO MRI - Has surgical clips in brain.   Planned  Pending:   Diagnostic/therapeutic left cervical ESI #2    Under consideration:   Diagnostic/therapeutic left cervical ESI #2    Completed:   Diagnostic/therapeutic left cervical ESI x1 (06/22/2023) (100/100/100 for neck and cervicogenic headaches/0 for upper extremity pain) Diagnostic/therapeutic left IA glenohumeral/AC joint inj. x1 (01/03/2023) (100/100/100/100) Therapeutic right suprascapular RFA x1 (01/03/2023) (100/100/0/75)  Diagnostic/therapeutic right suprascapular NB x1 (11/29/2022) (100/100/50/50)    Completed by other providers:   Therapeutic right shoulder arthroscopy, extensive debridement, subacromial decompression, open biceps tenodesis (03/31/2021) by Dr. Gershon Mussel [facility])  Diagnostic/therapeutic right  subacromial bursa inj. x1 (08/10/2021) by Dr. Gershon Mussel [facility])    Therapeutic  Palliative (PRN) options:   None established     Recent Visits Date Type Provider Dept  07/06/23 Office Visit Delano Metz, MD [facility]  06/22/23 Procedure visit Delano Metz, MD [facility]  06/06/23 Office Visit Delano Metz, MD [facility]  05/23/23 Procedure visit Delano Metz, MD [facility]  Showing recent visits within past 90 days and meeting all other requirements Future Appointments No visits were found meeting these conditions. Showing future appointments within next 90 days and meeting all other requirements  I discussed the assessment and treatment plan with the patient. The patient was provided an opportunity to ask questions and all were answered. The patient agreed with the plan and demonstrated an understanding of the instructions.  Patient advised  to call back or seek an in-person evaluation if the symptoms or condition worsens.  Duration of encounter: 30 minutes.  Total time on encounter, as per AMA guidelines included both the face-to-face and non-face-to-face time personally spent by the physician and/or other qualified health care professional(s) on the day of the encounter (includes time in activities that require the physician or other qualified health care professional and does not include time in activities normally performed by clinical staff). Physician's time may include the following activities when performed: Preparing to see the patient (e.g., pre-charting review of records, searching for previously ordered imaging, lab work, and nerve conduction tests) Review of prior analgesic pharmacotherapies. Reviewing PMP Interpreting ordered tests (e.g., lab work, imaging, nerve conduction tests) Performing post-procedure evaluations, including interpretation of diagnostic procedures Obtaining and/or reviewing  separately obtained history Performing a medically appropriate examination and/or evaluation Counseling and educating the patient/family/caregiver Ordering medications, tests, or procedures Referring and communicating with other health care professionals (when not separately reported) Documenting clinical information in the electronic or other health record Independently interpreting results (not separately reported) and communicating results to the patient/ family/caregiver Care coordination (not separately reported)  Note by: Michael Done, MD Date: 07/06/2023; Time: 3:56 PM

## 2023-07-06 ENCOUNTER — Ambulatory Visit: Payer: 59 | Attending: Pain Medicine | Admitting: Pain Medicine

## 2023-07-06 VITALS — BP 139/94 | HR 97 | Temp 98.1°F | Resp 16 | Ht 67.0 in | Wt 215.0 lb

## 2023-07-06 DIAGNOSIS — G4486 Cervicogenic headache: Secondary | ICD-10-CM | POA: Diagnosis not present

## 2023-07-06 DIAGNOSIS — M25512 Pain in left shoulder: Secondary | ICD-10-CM | POA: Insufficient documentation

## 2023-07-06 DIAGNOSIS — G8929 Other chronic pain: Secondary | ICD-10-CM | POA: Diagnosis not present

## 2023-07-06 DIAGNOSIS — Z09 Encounter for follow-up examination after completed treatment for conditions other than malignant neoplasm: Secondary | ICD-10-CM | POA: Insufficient documentation

## 2023-07-06 DIAGNOSIS — M5412 Radiculopathy, cervical region: Secondary | ICD-10-CM | POA: Insufficient documentation

## 2023-07-06 DIAGNOSIS — M542 Cervicalgia: Secondary | ICD-10-CM | POA: Diagnosis not present

## 2023-07-06 NOTE — Patient Instructions (Addendum)
Epidural Steroid Injection Patient Information  Description: The epidural space surrounds the nerves as they exit the spinal cord.  In some patients, the nerves can be compressed and inflamed by a bulging disc or a tight spinal canal (spinal stenosis).  By injecting steroids into the epidural space, we can bring irritated nerves into direct contact with a potentially helpful medication.  These steroids act directly on the irritated nerves and can reduce swelling and inflammation which often leads to decreased pain.  Epidural steroids may be injected anywhere along the spine and from the neck to the low back depending upon the location of your pain.   After numbing the skin with local anesthetic (like Novocaine), a small needle is passed into the epidural space slowly.  You may experience a sensation of pressure while this is being done.  The entire block usually last less than 10 minutes.  Conditions which may be treated by epidural steroids:  Low back and leg pain Neck and arm pain Spinal stenosis Post-laminectomy syndrome Herpes zoster (shingles) pain Pain from compression fractures  Preparation for the injection:  Do not eat any solid food or dairy products within 8 hours of your appointment.  You may drink clear liquids up to 3 hours before appointment.  Clear liquids include water, black coffee, juice or soda.  No milk or cream please. You may take your regular medication, including pain medications, with a sip of water before your appointment  Diabetics should hold regular insulin (if taken separately) and take 1/2 normal NPH dos the morning of the procedure.  Carry some sugar containing items with you to your appointment. A driver must accompany you and be prepared to drive you home after your procedure.  Bring all your current medications with your. An IV may be inserted and sedation may be given at the discretion of the physician.   A blood pressure cuff, EKG and other monitors will  often be applied during the procedure.  Some patients may need to have extra oxygen administered for a short period. You will be asked to provide medical information, including your allergies, prior to the procedure.  We must know immediately if you are taking blood thinners (like Coumadin/Warfarin)  Or if you are allergic to IV iodine contrast (dye). We must know if you could possible be pregnant.  Possible side-effects: Bleeding from needle site Infection (rare, may require surgery) Nerve injury (rare) Numbness & tingling (temporary) Difficulty urinating (rare, temporary) Spinal headache ( a headache worse with upright posture) Light -headedness (temporary) Pain at injection site (several days) Decreased blood pressure (temporary) Weakness in arm/leg (temporary) Pressure sensation in back/neck (temporary)  Call if you experience: Fever/chills associated with headache or increased back/neck pain. Headache worsened by an upright position. New onset weakness or numbness of an extremity below the injection site Hives or difficulty breathing (go to the emergency room) Inflammation or drainage at the infection site Severe back/neck pain Any new symptoms which are concerning to you  Please note:  Although the local anesthetic injected can often make your back or neck feel good for several hours after the injection, the pain will likely return.  It takes 3-7 days for steroids to work in the epidural space.  You may not notice any pain relief for at least that one week.  If effective, we will often do a series of three injections spaced 3-6 weeks apart to maximally decrease your pain.  After the initial series, we generally will wait several months before  considering a repeat injection of the same type.  If you have any questions, please call [phone] [facility] ______________________________________________________________________    Procedure  instructions  Stop blood-thinners  Do not eat or drink fluids (other than water) for 6 hours before your procedure  No water for 2 hours before your procedure  Take your blood pressure medicine with a sip of water  Arrive 30 minutes before your appointment  If sedation is planned, bring suitable driver. Pennie Banter, Benedetto Goad, & public transportation are NOT APPROVED)  Carefully read the "Preparing for your procedure" detailed instructions  If you have questions call us at [phone]  Procedure appointments are for procedures only. NO medication refills or new problem evaluations.   ______________________________________________________________________      ______________________________________________________________________    Preparing for your procedure  Appointments: If you think you may not be able to keep your appointment, call 24-48 hours in advance to cancel. We need time to make it available to others.  Procedure visits are for procedures only. During your procedure appointment there will be: NO Prescription Refills*. NO medication changes or discussions*. NO discussion of disability issues*. NO unrelated pain problem evaluations*. NO evaluations to order other pain procedures*. *These will be addressed at a separate and distinct evaluation encounter on the provider's evaluation schedule and not during procedure days.  Instructions: Food intake: Avoid eating anything solid for at least 8 hours prior to your procedure. Clear liquid intake: You may take clear liquids such as water up to 2 hours prior to your procedure. (No carbonated drinks. No soda.) Transportation: Unless otherwise stated by your physician, bring a driver. (Driver cannot be a Market researcher, Pharmacist, community, or any other form of public transportation.) Morning Medicines: Except for blood thinners, take all of your other morning medications with a sip of water. Make sure to take your heart and blood pressure medicines. If  your blood pressure's lower number is above 100, the case will be rescheduled. Blood thinners: Make sure to stop your blood thinners as instructed.  If you take a blood thinner, but were not instructed to stop it, call our office [phone] and ask to talk to a nurse. Not stopping a blood thinner prior to certain procedures could lead to serious complications. Diabetics on insulin: Notify the staff so that you can be scheduled 1st case in the morning. If your diabetes requires high dose insulin, take only  of your normal insulin dose the morning of the procedure and notify the staff that you have done so. Preventing infections: Shower with an antibacterial soap the morning of your procedure.  Build-up your immune system: Take 1000 mg of Vitamin C with every meal (3 times a day) the day prior to your procedure. Antibiotics: Inform the nursing staff if you are taking any antibiotics or if you have any conditions that may require antibiotics prior to procedures. (Example: recent joint implants)   Pregnancy: If you are pregnant make sure to notify the nursing staff. Not doing so may result in injury to the fetus, including death.  Sickness: If you have a cold, fever, or any active infections, call and cancel or reschedule your procedure. Receiving steroids while having an infection may result in complications. Arrival: You must be in the facility at least 30 minutes prior to your scheduled procedure. Tardiness: Your scheduled time is also the cutoff time. If you do not arrive at least 15 minutes prior to your procedure, you will be rescheduled.  Children:  Do not bring any children with you. Make arrangements to keep them home. Dress appropriately: There is always a possibility that your clothing may get soiled. Avoid long dresses. Valuables: Do not bring any jewelry or valuables.  Reasons to call and reschedule or cancel your procedure: (Following these recommendations will minimize the risk of a  serious complication.) Surgeries: Avoid having procedures within 2 weeks of any surgery. (Avoid for 2 weeks before or after any surgery). Flu Shots: Avoid having procedures within 2 weeks of a flu shots or . (Avoid for 2 weeks before or after immunizations). Barium: Avoid having a procedure within 7-10 days after having had a radiological study involving the use of radiological contrast. (Myelograms, Barium swallow or enema study). Heart attacks: Avoid any elective procedures or surgeries for the initial 6 months after a "Myocardial Infarction" (Heart Attack). Blood thinners: It is imperative that you stop these medications before procedures. Let us know if you if you take any blood thinner.  Infection: Avoid procedures during or within two weeks of an infection (including chest colds or gastrointestinal problems). Symptoms associated with infections include: Localized redness, fever, chills, night sweats or profuse sweating, burning sensation when voiding, cough, congestion, stuffiness, runny nose, sore throat, diarrhea, nausea, vomiting, cold or Flu symptoms, recent or current infections. It is specially important if the infection is over the area that we intend to treat. Heart and lung problems: Symptoms that may suggest an active cardiopulmonary problem include: cough, chest pain, breathing difficulties or shortness of breath, dizziness, ankle swelling, uncontrolled high or unusually low blood pressure, and/or palpitations. If you are experiencing any of these symptoms, cancel your procedure and contact your primary care physician for an evaluation.  Remember:  Regular Business hours are:  Monday to Thursday 8:00 AM to 4:00 PM  Provider's Schedule: Delano Metz, MD:  Procedure days: Tuesday and Thursday 7:30 AM to 4:00 PM  Edward Jolly, MD:  Procedure days: Monday and Wednesday 7:30 AM to 4:00 PM Last  Updated:  05/02/2023 ______________________________________________________________________      ______________________________________________________________________    General Risks and Possible Complications  Patient Responsibilities: It is important that you read this as it is part of your informed consent. It is our duty to inform you of the risks and possible complications associated with treatments offered to you. It is your responsibility as a patient to read this and to ask questions about anything that is not clear or that you believe was not covered in this document.  Patient's Rights: You have the right to refuse treatment. You also have the right to change your mind, even after initially having agreed to have the treatment done. However, under this last option, if you wait until the last second to change your mind, you may be charged for the materials used up to that point.  Introduction: Medicine is not an Visual merchandiser. Everything in Medicine, including the lack of treatment(s), carries the potential for danger, harm, or loss (which is by definition: Risk). In Medicine, a complication is a secondary problem, condition, or disease that can aggravate an already existing one. All treatments carry the risk of possible complications. The fact that a side effects or complications occurs, does not imply that the treatment was conducted incorrectly. It must be clearly understood that these can happen even when everything is done following the highest safety standards.  No treatment: You can choose not to proceed with the proposed treatment alternative. The "PRO(s)" would include: avoiding the risk of complications associated  with the therapy. The "CON(s)" would include: not getting any of the treatment benefits. These benefits fall under one of three categories: diagnostic; therapeutic; and/or palliative. Diagnostic benefits include: getting information which can ultimately lead to improvement of the  disease or symptom(s). Therapeutic benefits are those associated with the successful treatment of the disease. Finally, palliative benefits are those related to the decrease of the primary symptoms, without necessarily curing the condition (example: decreasing the pain from a flare-up of a chronic condition, such as incurable terminal cancer).  General Risks and Complications: These are associated to most interventional treatments. They can occur alone, or in combination. They fall under one of the following six (6) categories: no benefit or worsening of symptoms; bleeding; infection; nerve damage; allergic reactions; and/or death. No benefits or worsening of symptoms: In Medicine there are no guarantees, only probabilities. No healthcare provider can ever guarantee that a medical treatment will work, they can only state the probability that it may. Furthermore, there is always the possibility that the condition may worsen, either directly, or indirectly, as a consequence of the treatment. Bleeding: This is more common if the patient is taking a blood thinner, either prescription or over the counter (example: Goody Powders, Fish oil, Aspirin, Garlic, etc.), or if suffering a condition associated with impaired coagulation (example: Hemophilia, cirrhosis of the liver, low platelet counts, etc.). However, even if you do not have one on these, it can still happen. If you have any of these conditions, or take one of these drugs, make sure to notify your treating physician. Infection: This is more common in patients with a compromised immune system, either due to disease (example: diabetes, cancer, human immunodeficiency virus [HIV], etc.), or due to medications or treatments (example: therapies used to treat cancer and rheumatological diseases). However, even if you do not have one on these, it can still happen. If you have any of these conditions, or take one of these drugs, make sure to notify your treating  physician. Nerve Damage: This is more common when the treatment is an invasive one, but it can also happen with the use of medications, such as those used in the treatment of cancer. The damage can occur to small secondary nerves, or to large primary ones, such as those in the spinal cord and brain. This damage may be temporary or permanent and it may lead to impairments that can range from temporary numbness to permanent paralysis and/or brain death. Allergic Reactions: Any time a substance or material comes in contact with our body, there is the possibility of an allergic reaction. These can range from a mild skin rash (contact dermatitis) to a severe systemic reaction (anaphylactic reaction), which can result in death. Death: In general, any medical intervention can result in death, most of the time due to an unforeseen complication. ______________________________________________________________________

## 2023-07-17 ENCOUNTER — Telehealth: Payer: Self-pay

## 2023-07-17 NOTE — Telephone Encounter (Signed)
 Insurance denied stating it must be 2 weeks since previous procedure, and patient should have gotten at least 50% relief. I don't understand the rationale because in the notes it states patient got 100% relief of neck pain  and 100% relief from the cervicogenic headaches. He did not however obtain long term pain from the left arm pain. Dr. Laban Emperor wants to repeat the cervical epidural to try to eliminate the rest of the pain. He typically does a series of 3 epidural injections with much success from the patient receiving all three. The previous injection was done on 06/22/23 and the first one was done on 05/23/23. It has been well over the 2 weeks you say he must have. Can you advise on what other criteria we need to meet, in order to get this third epidural injection?

## 2023-07-21 ENCOUNTER — Other Ambulatory Visit: Payer: Self-pay

## 2023-07-21 ENCOUNTER — Other Ambulatory Visit: Payer: Self-pay | Admitting: Nurse Practitioner

## 2023-07-21 DIAGNOSIS — E7849 Other hyperlipidemia: Secondary | ICD-10-CM

## 2023-07-24 ENCOUNTER — Other Ambulatory Visit: Payer: Self-pay

## 2023-07-24 MED ORDER — ATORVASTATIN CALCIUM 40 MG PO TABS
40.0000 mg | ORAL_TABLET | Freq: Every day | ORAL | 1 refills | Status: DC
  Filled 2023-07-24: qty 30, 30d supply, fill #0

## 2023-07-31 ENCOUNTER — Encounter: Payer: Self-pay | Admitting: Nurse Practitioner

## 2023-07-31 ENCOUNTER — Ambulatory Visit (INDEPENDENT_AMBULATORY_CARE_PROVIDER_SITE_OTHER): Admitting: Nurse Practitioner

## 2023-07-31 VITALS — BP 138/83 | HR 93 | Wt 224.0 lb

## 2023-07-31 DIAGNOSIS — M25512 Pain in left shoulder: Secondary | ICD-10-CM

## 2023-07-31 DIAGNOSIS — G8929 Other chronic pain: Secondary | ICD-10-CM | POA: Diagnosis not present

## 2023-07-31 MED ORDER — KETOROLAC TROMETHAMINE 60 MG/2ML IM SOLN
60.0000 mg | Freq: Once | INTRAMUSCULAR | Status: AC
Start: 2023-07-31 — End: 2023-07-31
  Administered 2023-07-31: 60 mg via INTRAMUSCULAR

## 2023-07-31 MED ORDER — PREDNISONE 10 MG (21) PO TBPK
ORAL_TABLET | ORAL | 0 refills | Status: DC
Start: 2023-07-31 — End: 2023-08-22

## 2023-07-31 NOTE — Assessment & Plan Note (Signed)
 Toradol 60 mg injection given in the office today Short course of steroid ordered, patient encouraged to take medication with food, encouraged to avoid taking NSAIDs while taking steroids Patient referred to orthopedics, he might be a candidate for shoulder replacement surgery  - predniSONE (STERAPRED UNI-PAK 21 TAB) 10 MG (21) TBPK tablet; Please take with food and as instructed on the packaging  Dispense: 1 each; Refill: 0 - Ambulatory referral to Orthopedic Surgery - ketorolac (TORADOL) injection 60 mg

## 2023-07-31 NOTE — Progress Notes (Signed)
 Acute Office Visit  Subjective:     Patient ID: Michael Fleming, male    DOB: May 07, 1971, 53 y.o.   MRN: [id number]  Chief Complaint  Patient presents with   Shoulder Pain    Left side, started about a year ago,     HPI Mr Palin has PMH of Chronic knee pain (Left); Nontraumatic tear of supraspinatus tendon (Right); Tendinopathy of biceps tendon (Right); Degenerative superior 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) tear of shoulder (Right); Tendinosis of rotator cuff (Right); Osteoarthritis of AC (acromioclavicular) joint (Right); Chronic pain syndrome; Chronic shoulder pain (1ry area of Pain) (Right); Partial tear of subscapularis tendon, sequela (Right); Traumatic rupture of biceps tendon, sequela (Right); Cervicogenic headache; DDD (degenerative disc disease), cervical; Cervical foraminal stenosis (Bilateral); Radicular shoulder pain; Cervical facet hypertrophy; Cervical radiculopathy at C6; 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   Chronic left shoulder pain patient presents with complaints of chronic left shoulder pain related 10/10, stated that he has been dealing with this pain for the past 1 year.  He is followed by pain management and has been receiving cervical epidural steroid injection, states that the steroid injection was not helpful, last injection was given on 07/06/2023.  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  No complaints of fever, chills, chest pain, shortness of  breath, abdominal pain, nausea, vomiting   Review of Systems  Constitutional:  Negative for appetite change, chills, fatigue and fever.  HENT:  Negative for congestion, postnasal drip, rhinorrhea and sneezing.   Respiratory:  Negative for cough, shortness of breath and wheezing.   Cardiovascular:  Negative for chest pain, palpitations and leg swelling.  Gastrointestinal:  Negative for abdominal pain, constipation, nausea and vomiting.  Genitourinary:  Negative for difficulty urinating, dysuria, flank pain and frequency.  Musculoskeletal:  Positive for arthralgias. Negative for back pain and joint swelling.  Skin:  Negative for color change, pallor, rash and wound.  Neurological:  Negative for dizziness, facial asymmetry, weakness, numbness and headaches.  Psychiatric/Behavioral:  Negative for behavioral problems, confusion, self-injury and suicidal ideas.         Objective:    BP 138/83   Pulse 93   Wt 224 lb (101.6 kg)   BMI 35.08 kg/m    Physical Exam Vitals and nursing note reviewed.  Constitutional:      General: He is not in acute distress.    Appearance: Normal appearance. He is not ill-appearing, toxic-appearing or diaphoretic.  HENT:     Mouth/Throat:     Mouth: Mucous membranes are moist.     Pharynx: Oropharynx is clear. No oropharyngeal exudate or posterior oropharyngeal erythema.  Eyes:     General: No scleral icterus.       Right eye: No discharge.        Left eye: No discharge.     Extraocular Movements: Extraocular movements intact.     Conjunctiva/sclera: Conjunctivae normal.  Cardiovascular:     Rate and Rhythm: Normal rate and regular rhythm.     Pulses: Normal pulses.     Heart sounds: Normal heart sounds. No murmur heard.    No friction rub. No  gallop.  Pulmonary:     Effort: Pulmonary effort is normal. No respiratory distress.     Breath sounds: Normal breath sounds. No stridor. No wheezing, rhonchi or rales.  Chest:     Chest wall: No  tenderness.  Abdominal:     General: There is no distension.     Palpations: Abdomen is soft.     Tenderness: There is no abdominal tenderness. There is no right CVA tenderness, left CVA tenderness or guarding.  Musculoskeletal:        General: Tenderness present. No swelling, deformity or signs of injury.     Right lower leg: No edema.     Left lower leg: No edema.     Comments: Tenderness on range of motion of left shoulder, skin warm and dry no redness or swelling noted, has palpable radial pulse  Skin:    General: Skin is warm and dry.     Capillary Refill: Capillary refill takes less than 2 seconds.     Coloration: Skin is not jaundiced or pale.     Findings: No bruising, erythema or lesion.  Neurological:     Mental Status: He is alert and oriented to person, place, and time.     Motor: No weakness.     Coordination: Coordination normal.     Gait: Gait normal.  Psychiatric:        Mood and Affect: Mood normal.        Behavior: Behavior normal.        Thought Content: Thought content normal.        Judgment: Judgment normal.     No results found for any visits on 07/31/23.      Assessment & Plan:   Problem List Items Addressed This Visit       Other   Chronic shoulder pain (Left) - Primary (Chronic)   Toradol 60 mg injection given in the office today Short course of steroid ordered, patient encouraged to take medication with food, encouraged to avoid taking NSAIDs while taking steroids Patient referred to orthopedics, he might be a candidate for shoulder replacement surgery  - predniSONE (STERAPRED UNI-PAK 21 TAB) 10 MG (21) TBPK tablet; Please take with food and as instructed on the packaging  Dispense: 1 each; Refill: 0 - Ambulatory referral to Orthopedic Surgery - ketorolac (TORADOL) injection 60 mg       Relevant Medications   predniSONE (STERAPRED UNI-PAK 21 TAB) 10 MG (21) TBPK tablet   Other Relevant Orders   Ambulatory referral to Orthopedic Surgery     Meds ordered this encounter  Medications   predniSONE (STERAPRED UNI-PAK 21 TAB) 10 MG (21) TBPK tablet    Sig: Please take with food and as instructed on the packaging    Dispense:  1 each    Refill:  0   ketorolac (TORADOL) injection 60 mg    No follow-ups on file.  Donell Beers, FNP

## 2023-07-31 NOTE — Patient Instructions (Addendum)
 You were given Toradol 60mg  injection in the office today   1. Chronic shoulder pain (Left) (Primary)  - predniSONE (STERAPRED UNI-PAK 21 TAB) 10 MG (21) TBPK tablet; Please take with food and as instructed on the packaging  Dispense: 1 each; Refill: 0 - Ambulatory referral to Orthopedic Surgery    It is important that you exercise regularly at least 30 minutes 5 times a week as tolerated  Think about what you will eat, plan ahead. Choose " clean, green, fresh or frozen" over canned, processed or packaged foods which are more sugary, salty and fatty. 70 to 75% of food eaten should be vegetables and fruit. Three meals at set times with snacks allowed between meals, but they must be fruit or vegetables. Aim to eat over a 12 hour period , example 7 am to 7 pm, and STOP after  your last meal of the day. Drink water,generally about 64 ounces per day, no other drink is as healthy. Fruit juice is best enjoyed in a healthy way, by EATING the fruit.  Thanks for choosing [facility] we consider it a privelige to serve you.

## 2023-08-01 ENCOUNTER — Telehealth: Payer: Self-pay

## 2023-08-01 NOTE — Telephone Encounter (Signed)
 Insurance Treatment Denial Note  Date order was entered:  Order entered by: Delano Metz, MD Requested treatment: cesi Reason for denial: Documentation of pertinent physical exam is missing. Recommended for approval:  see below   The letter states  Your doctor told us you have nerve pain coming from your neck. A shot to treat this was requested. We cannot approve this request. The notes sent do not show: There was at least 2 weeks of two of the following: at least 50% improvement of pain, increased function and/or less medication, therapy or chiropractic care is needed after last injection.

## 2023-08-08 ENCOUNTER — Ambulatory Visit (INDEPENDENT_AMBULATORY_CARE_PROVIDER_SITE_OTHER): Admitting: Orthopaedic Surgery

## 2023-08-08 DIAGNOSIS — M25512 Pain in left shoulder: Secondary | ICD-10-CM

## 2023-08-08 DIAGNOSIS — G8929 Other chronic pain: Secondary | ICD-10-CM

## 2023-08-08 DIAGNOSIS — M5412 Radiculopathy, cervical region: Secondary | ICD-10-CM | POA: Diagnosis not present

## 2023-08-08 NOTE — Progress Notes (Signed)
 Office Visit Note   Patient: Michael Fleming           Date of Birth: 12/30/1970           MRN: [id number] Visit Date: 08/08/2023              Requested by: Ivonne Andrew, NP [id number] [address]. [address] [place],  [place] [address] PCP: Ivonne Andrew, NP   Assessment & Plan: Visit Diagnoses:  1. Chronic left shoulder pain   2. Cervical radiculopathy     Plan: Michael Fleming is a 53 year old gentleman with chronic left shoulder pain and cervical radiculopathy.  Findings are consistent with both conditions.  He has been at pain management for a couple years and he has not seen relief from ESI's.  Shoulder pain has not improved with home exercise program that he has been doing on his own.  These exercises were provided to him when he had the right shoulder problem.  We will order a C-spine MRI and a left shoulder MRI.  Follow-Up Instructions: No follow-ups on file.   Orders:  No orders of the defined types were placed in this encounter.  No orders of the defined types were placed in this encounter.     Procedures: No procedures performed   Clinical Data: No additional findings.   Subjective: Chief Complaint  Patient presents with   Left Shoulder - Pain    HPI Michael Fleming comes in today for evaluation of left shoulder and neck pain for over 6 months.  Has trouble sleeping and feels that he has limited range of motion.  Currently in pain management.  Has undergone extensive physical therapy for the neck and the shoulder. Review of Systems  Constitutional: Negative.   HENT: Negative.    Eyes: Negative.   Respiratory: Negative.    Cardiovascular: Negative.   Gastrointestinal: Negative.   Endocrine: Negative.   Genitourinary: Negative.   Skin: Negative.   Allergic/Immunologic: Negative.   Neurological: Negative.   Hematological: Negative.   Psychiatric/Behavioral: Negative.    All other systems reviewed and are negative.    Objective: Vital Signs: There were no vitals taken  for this visit.  Physical Exam Vitals and nursing note reviewed.  Constitutional:      Appearance: He is well-developed.  HENT:     Head: Normocephalic and atraumatic.  Eyes:     Pupils: Pupils are equal, round, and reactive to light.  Pulmonary:     Effort: Pulmonary effort is normal.  Abdominal:     Palpations: Abdomen is soft.  Musculoskeletal:        General: Normal range of motion.     Cervical back: Neck supple.  Skin:    General: Skin is warm.  Neurological:     Mental Status: He is alert and oriented to person, place, and time.  Psychiatric:        Behavior: Behavior normal.        Thought Content: Thought content normal.        Judgment: Judgment normal.     Ortho Exam Examination of cervical spine shows positive Spurling sign.  Normal range of motion.  Examination of the left shoulder shows normal range of motion with pain towards the extremes.  Manual muscle testing of the rotator cuff is normal.  Pain with Speed test.  Pain with impingement signs. Specialty Comments:  No specialty comments available.  Imaging: No results found.   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/22/2023   Cervicalgia 05/23/2023   Abnormal CT scan, cervical spine (12/07/2022) 05/08/2023   Chronic upper extremity pain (Left) 05/08/2023   Cervical radiculopathy (Left) 05/08/2023   Arthralgia of acromioclavicular joint (Right) 01/03/2023   Osteoarthritis of AC (acromioclavicular) joint (Left) 01/03/2023   Arthralgia of lacromioclavicular joint (Left) 01/03/2023   Chronic shoulder pain (Left) 12/15/2022   Chronic low back pain (Bilateral) w/o sciatica 12/15/2022   Lipid screening 10/07/2022   Cervicogenic headache 09/28/2022   DDD (degenerative disc disease), cervical 09/28/2022   Cervical foraminal stenosis (Bilateral) 09/28/2022   Radicular shoulder pain  09/28/2022   Cervical facet hypertrophy 09/28/2022   Cervical radiculopathy at C6 09/28/2022   Cervical disc disorder at C4-C5 level with radiculopathy 09/28/2022   Chronic shoulder pain (1ry area of Pain) (Right) 04/11/2022    Class: Chronic   Partial tear of subscapularis tendon, sequela (Right) 04/11/2022   Traumatic rupture of biceps tendon, sequela (Right) 04/11/2022   Need for immunization against influenza 04/08/2022   Chronic pain syndrome 02/16/2022   Pharmacologic therapy 02/16/2022   Disorder of skeletal system 02/16/2022   Problems influencing health status 02/16/2022   Osteoarthritis of AC (acromioclavicular) joint (Right) 11/12/2021   Tendinopathy of biceps tendon (Right)    Degenerative superior 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) tear of shoulder (Right)    Tendinosis of rotator cuff (Right)    Nontraumatic tear of supraspinatus tendon (Right) 03/04/2021   Hyperlipidemia 02/05/2019   Hypokalemia 02/05/2019   Vitamin D insufficiency 02/05/2019   Tick bite of groin 11/27/2018   Blurry vision 08/06/2018   Pain in wrist (Right) 03/27/2017   Chronic knee pain (Left) 03/27/2017   HTN (hypertension) 10/23/2016   History of brain surgery 10/23/2016   Past Medical History:  Diagnosis Date   Allergy    Biceps tendon tear    right   Heart palpitations 12/2018   Hyperlipidemia    Hypertension    Hypokalemia 12/2018   Seizures (HCC)    last seizure age 40 due to blood clot- 1981 none since     Family History  Problem Relation Age of Onset   Healthy Mother    Hypertension Father    Colon cancer Neg Hx    Colon polyps Neg Hx    Esophageal cancer Neg Hx    Rectal cancer Neg Hx    Stomach cancer Neg Hx     Past Surgical History:  Procedure Laterality Date   BRAIN SURGERY     1981 blot clot removed   FOOT SURGERY     right bunion removed   KNEE ARTHROSCOPY Left    SHOULDER ARTHROSCOPY WITH SUBACROMIAL DECOMPRESSION AND BICEP TENDON REPAIR Right 03/31/2021    Procedure: RIGHT SHOULDER ARTHROSCOPY, EXTENSIVE DEBRIDEMENT, SUBACROMIAL DECOMPRESSION, OPEN BICEPS TENODESIS;  Surgeon: Tarry Kos, MD;  Location: [facility];  Service: Orthopedics;  Laterality: Right;   WRIST SURGERY Left    Social History   Occupational History   Not on file  Tobacco Use   Smoking status: Never   Smokeless tobacco: Never  Vaping Use   Vaping status: Never Used  Substance and Sexual Activity   Alcohol use: Yes    Comment: occasional   Drug use: No   Sexual activity: Not Currently

## 2023-08-15 ENCOUNTER — Encounter: Payer: Self-pay | Admitting: Orthopaedic Surgery

## 2023-08-18 ENCOUNTER — Encounter: Payer: Self-pay | Admitting: Orthopaedic Surgery

## 2023-08-20 ENCOUNTER — Ambulatory Visit
Admission: RE | Admit: 2023-08-20 | Discharge: 2023-08-20 | Disposition: A | Discharge status: Home / Self Care | Source: Ambulatory Visit | Attending: Orthopaedic Surgery | Admitting: Orthopaedic Surgery

## 2023-08-20 DIAGNOSIS — M5412 Radiculopathy, cervical region: Secondary | ICD-10-CM

## 2023-08-20 DIAGNOSIS — G8929 Other chronic pain: Secondary | ICD-10-CM

## 2023-08-20 DIAGNOSIS — M7582 Other shoulder lesions, left shoulder: Secondary | ICD-10-CM | POA: Diagnosis not present

## 2023-08-20 DIAGNOSIS — M62512 Muscle wasting and atrophy, not elsewhere classified, left shoulder: Secondary | ICD-10-CM | POA: Diagnosis not present

## 2023-08-20 DIAGNOSIS — M75112 Incomplete rotator cuff tear or rupture of left shoulder, not specified as traumatic: Secondary | ICD-10-CM | POA: Diagnosis not present

## 2023-08-20 DIAGNOSIS — M4802 Spinal stenosis, cervical region: Secondary | ICD-10-CM | POA: Diagnosis not present

## 2023-08-20 DIAGNOSIS — M7552 Bursitis of left shoulder: Secondary | ICD-10-CM | POA: Diagnosis not present

## 2023-08-21 NOTE — Progress Notes (Unsigned)
 PROVIDER NOTE: Interpretation of information contained herein should be left to medically-trained personnel. Specific patient instructions are provided elsewhere under "Patient Instructions" section of medical record. This document was created in part using STT-dictation technology, any transcriptional errors that may result from this process are unintentional.  Patient: Michael Fleming Type: Established DOB: [birth year] MRN: [id number] PCP: Ivonne Andrew, NP  Service: Procedure DOS: 08/22/2023 Setting: Ambulatory Location: Ambulatory outpatient facility Delivery: Face-to-face Provider: Oswaldo Done, MD Specialty: Interventional Pain Management Specialty designation: 09 Location: Outpatient facility Ref. Prov.: Ivonne Andrew, NP       Interventional Therapy   Procedure: Cervical Epidural Steroid injection (CESI) (Interlaminar) #2  Laterality: Left  Level: C7-T1 Imaging: Fluoroscopy-assisted DOS: 08/22/2023  Performed by: Delano Metz, MD Anesthesia: Local anesthesia (1-2% Lidocaine) Anxiolysis: IV Versed 2.0 mg Sedation: Moderate Sedation                         Purpose: Diagnostic/Therapeutic Indications: Cervicalgia, cervical radicular pain, degenerative disc disease, severe enough to impact quality of life or function. 1. Cervicalgia   2. Cervical radiculopathy at C6   3. Cervical radiculopathy (Left)   4. Cervical foraminal stenosis (Bilateral)   5. Cervical disc disorder at C4-C5 level with radiculopathy   6. Cervicogenic headache   7. Chronic shoulder pain (Left)   8. Chronic upper extremity pain (Left)   9. DDD (degenerative disc disease), cervical    NAS-11 score:   Pre-procedure: 3 /10   Post-procedure: 2 /10      Position  Prep  Materials:  Location setting: Procedure suite Position: Prone, on modified reverse trendelenburg to facilitate breathing, with head in head-cradle. Pillows positioned under chest (below chin-level) with cervical spine  flexed. Safety Precautions: Patient was assessed for positional comfort and pressure points before starting the procedure. Prepping solution: DuraPrep (Iodine Povacrylex [0.7% available iodine] and Isopropyl Alcohol, 74% w/w) Prep Area: Entire  cervicothoracic region Approach: percutaneous, paramedial Intended target: Posterior cervical epidural space Materials Procedure:  Tray: Epidural Needle(s): Epidural (Tuohy) Qty: 1 Length: (90mm) 3.5-inch Gauge: 17G   H&P (Pre-op Assessment):  Michael Fleming is a 53 y.o. (year old), male patient, seen today for interventional treatment. He  has a past surgical history that includes Brain surgery; Foot surgery; Knee arthroscopy (Left); Wrist surgery (Left); and Shoulder arthroscopy with subacromial decompression and bicep tendon repair (Right, 03/31/2021). Michael Fleming has a current medication list which includes the following prescription(s): amlodipine, atorvastatin, losartan, and sildenafil. His primarily concern today is the Neck Pain  Initial Vital Signs:  Pulse/HCG Rate: 81  Temp: (!) 97.5 F (36.4 C) Resp: 16 BP: 137/89 SpO2: 99 %  BMI: Estimated body mass index is 35.08 kg/m as calculated from the following:   Height as of this encounter: 5\' 7"  (1.702 m).   Weight as of this encounter: 224 lb (101.6 kg).  Risk Assessment: Allergies: Reviewed. He has no known allergies.  Allergy Precautions: None required Coagulopathies: Reviewed. None identified.  Blood-thinner therapy: None at this time Active Infection(s): Reviewed. None identified. Michael Fleming is afebrile  Site Confirmation: Michael Fleming was asked to confirm the procedure and laterality before marking the site Procedure checklist: Completed Consent: Before the procedure and under the influence of no sedative(s), amnesic(s), or anxiolytics, the patient was informed of the treatment options, risks and possible complications. To fulfill our ethical and legal obligations, as recommended by  the American Medical Association's Code of Ethics, I have informed the patient of my  clinical impression; the nature and purpose of the treatment or procedure; the risks, benefits, and possible complications of the intervention; the alternatives, including doing nothing; the risk(s) and benefit(s) of the alternative treatment(s) or procedure(s); and the risk(s) and benefit(s) of doing nothing. The patient was provided information about the general risks and possible complications associated with the procedure. These may include, but are not limited to: failure to achieve desired goals, infection, bleeding, organ or nerve damage, allergic reactions, paralysis, and death. In addition, the patient was informed of those risks and complications associated to Spine-related procedures, such as failure to decrease pain; infection (i.e.: Meningitis, epidural or intraspinal abscess); bleeding (i.e.: epidural hematoma, subarachnoid hemorrhage, or any other type of intraspinal or peri-dural bleeding); organ or nerve damage (i.e.: Any type of peripheral nerve, nerve root, or spinal cord injury) with subsequent damage to sensory, motor, and/or autonomic systems, resulting in permanent pain, numbness, and/or weakness of one or several areas of the body; allergic reactions; (i.e.: anaphylactic reaction); and/or death. Furthermore, the patient was informed of those risks and complications associated with the medications. These include, but are not limited to: allergic reactions (i.e.: anaphylactic or anaphylactoid reaction(s)); adrenal axis suppression; blood sugar elevation that in diabetics may result in ketoacidosis or comma; water retention that in patients with history of congestive heart failure may result in shortness of breath, pulmonary edema, and decompensation with resultant heart failure; weight gain; swelling or edema; medication-induced neural toxicity; particulate matter embolism and blood vessel occlusion with  resultant organ, and/or nervous system infarction; and/or aseptic necrosis of one or more joints. Finally, the patient was informed that Medicine is not an exact science; therefore, there is also the possibility of unforeseen or unpredictable risks and/or possible complications that may result in a catastrophic outcome. The patient indicated having understood very clearly. We have given the patient no guarantees and we have made no promises. Enough time was given to the patient to ask questions, all of which were answered to the patient's satisfaction. Mr. Lindstrom has indicated that he wanted to continue with the procedure. Attestation: I, the ordering provider, attest that I have discussed with the patient the benefits, risks, side-effects, alternatives, likelihood of achieving goals, and potential problems during recovery for the procedure that I have provided informed consent. Date  Time: 08/22/2023 10:24 AM   Pre-Procedure Preparation:  Monitoring: As per clinic protocol. Respiration, ETCO2, SpO2, BP, heart rate and rhythm monitor placed and checked for adequate function Safety Precautions: Patient was assessed for positional comfort and pressure points before starting the procedure. Time-out: I initiated and conducted the "Time-out" before starting the procedure, as per protocol. The patient was asked to participate by confirming the accuracy of the "Time Out" information. Verification of the correct person, site, and procedure were performed and confirmed by me, the nursing staff, and the patient. "Time-out" conducted as per Joint Commission's Universal Protocol (UP.01.01.01). Time: 1106 Start Time: 1106 hrs.  Description  Narrative of Procedure:          Rationale (medical necessity): procedure needed and proper for the diagnosis and/or treatment of the patient's medical symptoms and needs. Start Time: 1106 hrs. Safety Precautions: Aspiration looking for blood return was conducted prior to all  injections. At no point did we inject any substances, as a needle was being advanced. No attempts were made at seeking any paresthesias. Safe injection practices and needle disposal techniques used. Medications properly checked for expiration dates. SDV (single dose vial) medications used. Description of procedure: Protocol  guidelines were followed. The patient was assisted into a comfortable position. The target area was identified and the area prepped in the usual manner. Skin & deeper tissues infiltrated with local anesthetic. Appropriate amount of time allowed to pass for local anesthetics to take effect. Using fluoroscopic guidance, the epidural needle was introduced through the skin, ipsilateral to the reported pain, and advanced to the target area. Posterior laminar os was contacted and the needle walked caudad, until the lamina was cleared. The ligamentum flavum was engaged and the epidural space identified using "loss-of-resistance technique" with 2-3 ml of PF-NaCl (0.9% NSS), in a 5cc dedicated LOR syringe. (See "Imaging guidance" below for use of contrast details.) Once proper needle placement was secured, and negative aspiration confirmed, the solution was injected in intermittent fashion, asking for systemic symptoms every 0.5cc. The needles were then removed and the area cleansed, making sure to leave some of the prepping solution back to take advantage of its long term bactericidal properties.  Vitals:   08/22/23 1105 08/22/23 1110 08/22/23 1115 08/22/23 1119  BP: (!) 132/92 (!) 131/90 (!) 133/95 (!) 132/90  Pulse: 79 80 82 79  Resp: 16 15 16 14   Temp:      SpO2: 98% 99% 98% 98%  Weight:      Height:         End Time: 1113 hrs.  Imaging Guidance (Spinal):          Type of Imaging Technique: Fluoroscopy Guidance (Spinal) Indication(s): Fluoroscopy guidance for needle placement to enhance accuracy in procedures requiring precise needle localization for targeted delivery of medication  in or near specific anatomical locations not easily accessible without such real-time imaging assistance. Exposure Time: Please see nurses notes. Contrast: Before injecting any contrast, we confirmed that the patient did not have an allergy to iodine, shellfish, or radiological contrast. Once satisfactory needle placement was completed at the desired level, radiological contrast was injected. Contrast injected under live fluoroscopy. No contrast complications. See chart for type and volume of contrast used. Fluoroscopic Guidance: I was personally present during the use of fluoroscopy. "Tunnel Vision Technique" used to obtain the best possible view of the target area. Parallax error corrected before commencing the procedure. "Direction-depth-direction" technique used to introduce the needle under continuous pulsed fluoroscopy. Once target was reached, antero-posterior, oblique, and lateral fluoroscopic projection used confirm needle placement in all planes. Images permanently stored in EMR. Interpretation: I personally interpreted the imaging intraoperatively. Adequate needle placement confirmed in multiple planes. Appropriate spread of contrast into desired area was observed. No evidence of afferent or efferent intravascular uptake. No intrathecal or subarachnoid spread observed. Permanent images saved into the patient's record.  Post-operative Assessment:  Post-procedure Vital Signs:  Pulse/HCG Rate: 79  Temp: (!) 97.5 F (36.4 C) Resp: 14 BP: (!) 132/90 SpO2: 98 %  EBL: None  Complications: No immediate post-treatment complications observed by team, or reported by patient.  Note: The patient tolerated the entire procedure well. A repeat set of vitals were taken after the procedure and the patient was kept under observation following institutional policy, for this type of procedure. Post-procedural neurological assessment was performed, showing return to baseline, prior to discharge. The patient  was provided with post-procedure discharge instructions, including a section on how to identify potential problems. Should any problems arise concerning this procedure, the patient was given instructions to immediately contact us, at any time, without hesitation. In any case, we plan to contact the patient by telephone for a follow-up status report regarding  this interventional procedure.  Comments:  No additional relevant information.  Plan of Care (POC)  Orders:  Orders Placed This Encounter  Procedures   Cervical Epidural Injection    Indication(s): Radiculitis and cervicalgia associated with cervical degenerative disc disease. Position: Prone Imaging guidance: Fluoroscopy required. Contrast required unless contraindicated by allergy or severe CKD. Equipment & Materials: Epidural tray & needle.    Scheduling Instructions:     Procedure: Cervical Epidural Steroid Injection/Block     Planned Level(s): C7-T1     Laterality: Left-sided     Anxiolysis: Patient's choice.     Timeframe: Today    Where will this procedure be performed?:   [facility]             by Dr. Laban Emperor   Cervical Epidural Injection    Sedation: Patient's choice. Purpose: Therapeutic Indication(s): Radiculitis and cervicalgia associater with cervical degenerative disc disease.    Standing Status:   Future    Expiration Date:   11/21/2023    Scheduling Instructions:     Procedure: Cervical Epidural Steroid Injection/Block     Level(s): C7-T1     Laterality: Left-sided     Timeframe: 2 weeks from now.    Where will this procedure be performed?:   [facility]             by Dr. Conception Oms [facility] C-[facility] 1-60 MIN NO REPORT    Intraoperative interpretation by procedural physician at [facility].    Standing Status:   Standing    Number of Occurrences:   1    Reason for exam::   Assistance in needle guidance and placement for procedures requiring needle placement in or near specific  anatomical locations not easily accessible without such assistance.   Informed Consent Details: Physician/Practitioner Attestation; Transcribe to consent form and obtain patient signature    Nursing instructions: Transcribe to consent form and obtain patient signature. Always confirm laterality of pain with Mr. Siegert, before procedure.    Physician/Practitioner attestation of informed consent for procedure/surgical case:   I, the physician/practitioner, attest that I have discussed with the patient the benefits, risks, side effects, alternatives, likelihood of achieving goals and potential problems during recovery for the procedure that I have provided informed consent.    Procedure:   Cervical Epidural Steroid Injection (CESI) under fluoroscopic guidance    Physician/Practitioner performing the procedure:   Florie Carico A. Laban Emperor MD    Indication/Reason:   Indications: Cervicalgia (neck pain), cervical radicular pain, radiculitis (arm/shoulder pain, numbness, and/or weakness), degenerative disc disease, severe enough to greatly impact quality of life or function.   Provide equipment / supplies at bedside    Procedural tray: Epidural Tray (Disposable  single use) Skin infiltration needle: Regular 1.5-in, 25-G, (x1) Block needle size: Regular standard Catheter: No catheter required    Standing Status:   Standing    Number of Occurrences:   1    Specify:   Epidural Tray   Saline lock IV    Have LR [phone] mL available and administer at 125 mL/hr if patient becomes hypotensive.    Standing Status:   Standing    Number of Occurrences:   1   Chronic Opioid Analgesic:  No chronic opioid analgesics therapy prescribed by our practice. None MME/day: 0 mg/day   Medications ordered for procedure: Meds ordered this encounter  Medications   iohexol (OMNIPAQUE) 180 MG/ML injection 10 mL    Must be Myelogram-compatible. If not available,  you may substitute with a water-soluble, non-ionic,  hypoallergenic, myelogram-compatible radiological contrast medium.   lidocaine (XYLOCAINE) 2 % (with pres) injection 400 mg   pentafluoroprop-tetrafluoroeth (GEBAUERS) aerosol   midazolam (VERSED) injection 0.5-2 mg    Make sure Flumazenil is available in the pyxis when using this medication. If oversedation occurs, administer 0.2 mg IV over 15 sec. If after 45 sec no response, administer 0.2 mg again over 1 min; may repeat at 1 min intervals; not to exceed 4 doses (1 mg)   sodium chloride flush (NS) 0.9 % injection 1 mL   ropivacaine (PF) 2 mg/mL (0.2%) (NAROPIN) injection 1 mL   dexamethasone (DECADRON) injection 10 mg   Medications administered: We administered iohexol, lidocaine, pentafluoroprop-tetrafluoroeth, midazolam, sodium chloride flush, ropivacaine (PF) 2 mg/mL (0.2%), and dexamethasone.  See the medical record for exact dosing, route, and time of administration.  Follow-up plan:   Return in about 2 weeks (around 09/05/2023) for (ECT): (L) CESI #3.       Interventional Therapies  Risk  Complexity Considerations:   HTN  seizure disorder   NO MRI - Has surgical clips in brain.   Planned  Pending:   Diagnostic/therapeutic left cervical ESI #2    Under consideration:   Diagnostic/therapeutic left cervical ESI #2    Completed:   Diagnostic/therapeutic left cervical ESI x1 (06/22/2023) (100/100/100 for neck and cervicogenic headaches/0 for upper extremity pain) Diagnostic/therapeutic left IA glenohumeral/AC joint inj. x1 (01/03/2023) (100/100/100/100) Therapeutic right suprascapular RFA x1 (01/03/2023) (100/100/0/75)  Diagnostic/therapeutic right suprascapular NB x1 (11/29/2022) (100/100/50/50)    Completed by other providers:   Therapeutic right shoulder arthroscopy, extensive debridement, subacromial decompression, open biceps tenodesis (03/31/2021) by Dr. Gershon Mussel [facility])  Diagnostic/therapeutic right subacromial bursa inj. x1 (08/10/2021) by Dr. Gershon Mussel [facility])     Therapeutic  Palliative (PRN) options:   None established     Recent Visits Date Type Provider Dept  07/06/23 Office Visit Delano Metz, MD [facility]  06/22/23 Procedure visit Delano Metz, MD [facility]  06/06/23 Office Visit Delano Metz, MD [facility]  Showing recent visits within past 90 days and meeting all other requirements Today's Visits Date Type Provider Dept  08/22/23 Procedure visit Delano Metz, MD [facility]  Showing today's visits and meeting all other requirements Future Appointments No visits were found meeting these conditions. Showing future appointments within next 90 days and meeting all other requirements  Disposition: Discharge home  Discharge (Date  Time): 08/22/2023; 1130 hrs.   Primary Care Physician: Ivonne Andrew, NP Location: [facility] Note by: Oswaldo Done, MD (TTS technology used. I apologize for any typographical errors that were not detected and corrected.) Date: 08/22/2023; Time: 11:59 AM  Disclaimer:  Medicine is not an Visual merchandiser. The only guarantee in medicine is that nothing is guaranteed. It is important to note that the decision to proceed with this intervention was based on the information collected from the patient. The Data and conclusions were drawn from the patient's questionnaire, the interview, and the physical examination. Because the information was provided in large part by the patient, it cannot be guaranteed that it has not been purposely or unconsciously manipulated. Every effort has been made to obtain as much relevant data as possible for this evaluation. It is important to note that the conclusions that lead to this procedure are derived in large part from the available data. Always take into account that the treatment will also be dependent on  availability of resources and existing treatment guidelines, considered by  other Pain Management Practitioners as being common knowledge and practice, at the time of the intervention. For Medico-Legal purposes, it is also important to point out that variation in procedural techniques and pharmacological choices are the acceptable norm. The indications, contraindications, technique, and results of the above procedure should only be interpreted and judged by a Board-Certified Interventional Pain Specialist with extensive familiarity and expertise in the same exact procedure and technique.

## 2023-08-22 ENCOUNTER — Ambulatory Visit
Admission: RE | Admit: 2023-08-22 | Discharge: 2023-08-22 | Disposition: A | Discharge status: Home / Self Care | Source: Ambulatory Visit | Attending: Pain Medicine | Admitting: Pain Medicine

## 2023-08-22 ENCOUNTER — Encounter: Payer: Self-pay | Admitting: Pain Medicine

## 2023-08-22 ENCOUNTER — Ambulatory Visit: Attending: Pain Medicine | Admitting: Pain Medicine

## 2023-08-22 VITALS — BP 132/90 | HR 79 | Temp 97.5°F | Resp 14 | Ht 67.0 in | Wt 224.0 lb

## 2023-08-22 DIAGNOSIS — M25512 Pain in left shoulder: Secondary | ICD-10-CM | POA: Diagnosis not present

## 2023-08-22 DIAGNOSIS — M4802 Spinal stenosis, cervical region: Secondary | ICD-10-CM

## 2023-08-22 DIAGNOSIS — G8929 Other chronic pain: Secondary | ICD-10-CM | POA: Diagnosis not present

## 2023-08-22 DIAGNOSIS — M5030 Other cervical disc degeneration, unspecified cervical region: Secondary | ICD-10-CM | POA: Diagnosis not present

## 2023-08-22 DIAGNOSIS — M50121 Cervical disc disorder at C4-C5 level with radiculopathy: Secondary | ICD-10-CM | POA: Diagnosis not present

## 2023-08-22 DIAGNOSIS — M542 Cervicalgia: Secondary | ICD-10-CM | POA: Diagnosis not present

## 2023-08-22 DIAGNOSIS — M79602 Pain in left arm: Secondary | ICD-10-CM | POA: Diagnosis not present

## 2023-08-22 DIAGNOSIS — M5412 Radiculopathy, cervical region: Secondary | ICD-10-CM

## 2023-08-22 DIAGNOSIS — G4486 Cervicogenic headache: Secondary | ICD-10-CM

## 2023-08-22 MED ORDER — SODIUM CHLORIDE 0.9% FLUSH
1.0000 mL | Freq: Once | INTRAVENOUS | Status: AC
Start: 2023-08-22 — End: 2023-08-22
  Administered 2023-08-22: 1 mL

## 2023-08-22 MED ORDER — LIDOCAINE HCL 2 % IJ SOLN
20.0000 mL | Freq: Once | INTRAMUSCULAR | Status: AC
Start: 2023-08-22 — End: 2023-08-22
  Administered 2023-08-22: 400 mg
  Filled 2023-08-22: qty 20

## 2023-08-22 MED ORDER — PENTAFLUOROPROP-TETRAFLUOROETH EX AERO
INHALATION_SPRAY | Freq: Once | CUTANEOUS | Status: AC
Start: 2023-08-22 — End: 2023-08-22
  Administered 2023-08-22: 30 via TOPICAL

## 2023-08-22 MED ORDER — IOHEXOL 180 MG/ML  SOLN
10.0000 mL | Freq: Once | INTRAMUSCULAR | Status: AC
Start: 2023-08-22 — End: 2023-08-22
  Administered 2023-08-22: 10 mL via EPIDURAL
  Filled 2023-08-22: qty 20

## 2023-08-22 MED ORDER — ROPIVACAINE HCL 2 MG/ML IJ SOLN
1.0000 mL | Freq: Once | INTRAMUSCULAR | Status: AC
  Administered 2023-08-22: 1 mL via EPIDURAL
  Filled 2023-08-22: qty 20

## 2023-08-22 MED ORDER — DEXAMETHASONE SODIUM PHOSPHATE 10 MG/ML IJ SOLN
10.0000 mg | Freq: Once | INTRAMUSCULAR | Status: AC
  Administered 2023-08-22: 10 mg
  Filled 2023-08-22: qty 1

## 2023-08-22 MED ORDER — MIDAZOLAM HCL 2 MG/2ML IJ SOLN
0.5000 mg | Freq: Once | INTRAMUSCULAR | Status: AC
  Administered 2023-08-22: 2 mg via INTRAVENOUS
  Filled 2023-08-22: qty 2

## 2023-08-22 NOTE — Progress Notes (Signed)
 Safety precautions to be maintained throughout the outpatient stay will include: orient to surroundings, keep bed in low position, maintain call bell within reach at all times, provide assistance with transfer out of bed and ambulation.

## 2023-08-22 NOTE — Patient Instructions (Signed)

## 2023-08-23 ENCOUNTER — Telehealth: Payer: Self-pay

## 2023-08-23 NOTE — Telephone Encounter (Signed)
 Post procedure follow up.  LM

## 2023-09-05 ENCOUNTER — Encounter: Payer: Self-pay | Admitting: Pain Medicine

## 2023-09-05 ENCOUNTER — Ambulatory Visit: Attending: Pain Medicine | Admitting: Pain Medicine

## 2023-09-05 VITALS — BP 127/74 | HR 89 | Temp 98.1°F | Resp 14 | Ht 67.0 in | Wt 224.0 lb

## 2023-09-05 DIAGNOSIS — M4802 Spinal stenosis, cervical region: Secondary | ICD-10-CM | POA: Insufficient documentation

## 2023-09-05 DIAGNOSIS — M25512 Pain in left shoulder: Secondary | ICD-10-CM | POA: Diagnosis not present

## 2023-09-05 DIAGNOSIS — M79602 Pain in left arm: Secondary | ICD-10-CM | POA: Diagnosis not present

## 2023-09-05 DIAGNOSIS — G4486 Cervicogenic headache: Secondary | ICD-10-CM | POA: Insufficient documentation

## 2023-09-05 DIAGNOSIS — M50121 Cervical disc disorder at C4-C5 level with radiculopathy: Secondary | ICD-10-CM | POA: Insufficient documentation

## 2023-09-05 DIAGNOSIS — G8929 Other chronic pain: Secondary | ICD-10-CM | POA: Diagnosis not present

## 2023-09-05 DIAGNOSIS — M5030 Other cervical disc degeneration, unspecified cervical region: Secondary | ICD-10-CM | POA: Diagnosis not present

## 2023-09-05 DIAGNOSIS — Z09 Encounter for follow-up examination after completed treatment for conditions other than malignant neoplasm: Secondary | ICD-10-CM | POA: Insufficient documentation

## 2023-09-05 DIAGNOSIS — M5412 Radiculopathy, cervical region: Secondary | ICD-10-CM | POA: Insufficient documentation

## 2023-09-05 DIAGNOSIS — M542 Cervicalgia: Secondary | ICD-10-CM | POA: Diagnosis not present

## 2023-09-05 NOTE — Progress Notes (Signed)
 PROVIDER NOTE: Interpretation of information contained herein should be left to medically-trained personnel. Specific patient instructions are provided elsewhere under "Patient Instructions" section of medical record. This document was created in part using AI and STT-dictation technology, any transcriptional errors that may result from this process are unintentional.  Patient: Michael Fleming  Service: E/M   PCP: Ivonne Andrew, NP  DOB: [birth year]  DOS: 09/05/2023  Provider: Oswaldo Done, MD  MRN: [id number]  Delivery: Face-to-face  Specialty: Interventional Pain Management  Type: Established Patient  Setting: Ambulatory outpatient facility  Specialty designation: 09  Referring Prov.: Ivonne Andrew, NP  Location: Outpatient office facility       HPI  [name], a 53 y.o. year old male, is here today because of his Cervicalgia [M54.2]. [name]'s primary complain today is Neck Pain  Pertinent problems: 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); Tendinosis of rotator cuff (Right); Osteoarthritis of AC (acromioclavicular) joint (Right); Chronic pain syndrome; Chronic shoulder pain (1ry area of Pain) (Right); Partial tear of subscapularis tendon, sequela (Right); Traumatic rupture of biceps tendon, sequela (Right); Cervicogenic headache; DDD (degenerative disc disease), cervical; Cervical foraminal stenosis (Bilateral); Radicular shoulder pain; Cervical facet hypertrophy; Cervical radiculopathy at C6; Cervical disc disorder at C4-C5 level with radiculopathy; Chronic shoulder pain (Left); Chronic low back pain (Bilateral) w/o sciatica; Arthralgia of acromioclavicular joint (Right); Osteoarthritis of AC (acromioclavicular) joint (Left); Arthralgia of lacromioclavicular joint (Left); Abnormal CT  scan, cervical spine (12/07/2022); Chronic upper extremity pain (Left); Cervical radiculopathy (Left); Cervicalgia; Osteoarthritis of shoulder (Left); and Osteoarthritis of glenohumeral joint (Left) on their pertinent problem list. Pain Assessment: Severity of Chronic pain is reported as a 9 /10. Location: Neck  /left upper arm. Onset: More than a month ago. Quality: Aching, Burning, Constant, Throbbing. Timing: Constant. Modifying factor(s): nothing. Vitals:  height is 5\' 7"  (1.702 m) and weight is 224 lb (101.6 kg). His temporal temperature is 98.1 F (36.7 C). His blood pressure is 127/74 and his pulse is 89. His respiration is 14 and oxygen saturation is 98%.  BMI: Estimated body mass index is 35.08 kg/m as calculated from the following:   Height as of this encounter: 5\' 7"  (1.702 m).   Weight as of this encounter: 224 lb (101.6 kg). Last encounter: 07/06/2023. Last procedure: 08/22/2023.  Reason for encounter: post-procedure evaluation and assessment.  Discussed the use of AI scribe software for clinical note transcription with the patient, who gave verbal consent to proceed.  History of Present Illness   He is a 53 year old male who presents with persistent neck and arm pain following cervical epidural steroid injections.  He experiences persistent neck and arm pain following cervical epidural steroid injections. The second injection provided temporary relief, with numbness lasting for about two days, but the pain returned to its previous intensity. The pain radiates from the neck to the arms, previously extending to the hands, but now stops before reaching them. He describes the sensation in his arms as dull and numb, with persistent pain that is severe enough to cause emotional distress.  An MRI of the cervical spine was performed on August 20, 2023, showing narrowing of the canal at multiple levels, including C2-3, C3-4, C4-5, C5-6, and C7-T1. He has not had any prior neck surgery.       Post-procedure evaluation   Procedure: Cervical Epidural Steroid  injection (CESI) (Interlaminar) #2  Laterality: Left  Level: C7-T1 Imaging: Fluoroscopy-assisted DOS: 08/22/2023  Performed by: Renaldo Caroli, MD Anesthesia: Local anesthesia (1-2% Lidocaine) Anxiolysis: IV Versed 2.0 mg Sedation: Moderate Sedation                         Purpose: Diagnostic/Therapeutic Indications: Cervicalgia, cervical radicular pain, degenerative disc disease, severe enough to impact quality of life or function. 1. Cervicalgia   2. Cervical radiculopathy at C6   3. Cervical radiculopathy (Left)   4. Cervical foraminal stenosis (Bilateral)   5. Cervical disc disorder at C4-C5 level with radiculopathy   6. Cervicogenic headache   7. Chronic shoulder pain (Left)   8. Chronic upper extremity pain (Left)   9. DDD (degenerative disc disease), cervical    NAS-11 score:   Pre-procedure: 3 /10   Post-procedure: 2 /10    Effectiveness:  Initial hour after procedure: 100 %. Subsequent 4-6 hours post-procedure: 100 %. Analgesia past initial 6 hours: 0 % (lasted 2 days). Ongoing improvement:  Analgesic: Oral the patient indicates having attained 100% relief of the pain from additional local anesthetic he also states that once that local anesthetic wore off the pain came back just as it was before.  He denies any further benefit from the second cervical epidural steroid injection therefore we will not be doing a third one. Function: No improvement ROM: No improvement   Pharmacotherapy Assessment  Analgesic: No chronic opioid analgesics therapy prescribed by our practice. None MME/day: 0 mg/day    Monitoring: [place] PMP: PDMP not reviewed this encounter.       Pharmacotherapy: No side-effects or adverse reactions reported. Compliance: No problems identified. Effectiveness: Clinically acceptable.  No notes on file  No results found for: "CBDTHCR" No results found for: "D8THCCBX" No results  found for: "D9THCCBX"  UDS:  Summary  Date Value Ref Range Status  02/16/2022 Note  Final    Comment:    ==================================================================== Compliance Drug Analysis, Ur ==================================================================== Test                             Result       Flag       Units    NO DRUGS DETECTED. ==================================================================== Test                      Result    Flag   Units      Ref Range   Creatinine              28               mg/dL      >=16 ==================================================================== Declared Medications:  The flagging and interpretation on this report are based on the  following declared medications.  Unexpected results may arise from  inaccuracies in the declared medications.   **Note: The testing scope of this panel does not include the  following reported medications:   Amlodipine (Norvasc)  Atorvastatin (Lipitor)  Losartan (Cozaar) ==================================================================== For clinical consultation, please call [phone]. ====================================================================       ROS  Constitutional: Denies any fever or chills Gastrointestinal: No reported hemesis, hematochezia, vomiting, or acute GI distress Musculoskeletal: Denies any acute onset joint swelling, redness, loss of ROM, or weakness Neurological: No reported episodes of acute onset apraxia, aphasia, dysarthria, agnosia, amnesia, paralysis, loss of coordination, or loss of consciousness  Medication Review  amLODipine,  atorvastatin, losartan, and sildenafil  History Review  Allergy: Michael Fleming has no known allergies. Drug: Michael Fleming  reports no history of drug use. Alcohol:  reports current alcohol use. Tobacco:  reports that he has never smoked. He has never used smokeless tobacco. Social: Michael Fleming  reports that he has never  smoked. He has never used smokeless tobacco. He reports current alcohol use. He reports that he does not use drugs. Medical:  has a past medical history of Allergy, Biceps tendon tear, Heart palpitations (12/2018), Hyperlipidemia, Hypertension, Hypokalemia (12/2018), and Seizures (HCC). Surgical: Michael Fleming  has a past surgical history that includes Brain surgery; Foot surgery; Knee arthroscopy (Left); Wrist surgery (Left); and Shoulder arthroscopy with subacromial decompression and bicep tendon repair (Right, 03/31/2021). Family: family history includes Healthy in his mother; Hypertension in his father.  Laboratory Chemistry Profile   Renal Lab Results  Component Value Date   BUN 10 04/10/2023   CREATININE 1.10 04/10/2023   BCR 9 04/10/2023   GFRAA 82 08/05/2019   GFRNONAA 71 08/05/2019    Hepatic Lab Results  Component Value Date   AST 31 04/10/2023   ALT 33 04/10/2023   ALBUMIN 4.4 04/10/2023   ALKPHOS 100 04/10/2023    Electrolytes Lab Results  Component Value Date   NA 139 04/10/2023   K 3.9 04/10/2023   CL 101 04/10/2023   CALCIUM 9.8 04/10/2023   MG 1.9 02/16/2022    Bone Lab Results  Component Value Date   VD25OH 24.4 (L) 08/05/2020   25OHVITD1 25 (L) 02/16/2022   25OHVITD2 2.0 02/16/2022   25OHVITD3 23 02/16/2022    Inflammation (CRP: Acute Phase) (ESR: Chronic Phase) Lab Results  Component Value Date   CRP 5 02/16/2022   ESRSEDRATE 17 02/16/2022         Note: Above Lab results reviewed.  Recent Imaging Review  DG PAIN CLINIC C-ARM 1-60 MIN NO REPORT Fluoro was used, but no Radiologist interpretation will be provided.  Please refer to "NOTES" tab for provider progress note. Note: Reviewed        Physical Exam  General appearance: Well nourished, well developed, and well hydrated. In no apparent acute distress Mental status: Alert, oriented x 3 (person, place, & time)       Respiratory: No evidence of acute respiratory distress Eyes: PERLA Vitals: BP  127/74 (Cuff Size: Large)   Pulse 89   Temp 98.1 F (36.7 C) (Temporal)   Resp 14   Ht 5\' 7"  (1.702 m)   Wt 224 lb (101.6 kg)   SpO2 98%   BMI 35.08 kg/m  BMI: Estimated body mass index is 35.08 kg/m as calculated from the following:   Height as of this encounter: 5\' 7"  (1.702 m).   Weight as of this encounter: 224 lb (101.6 kg). Ideal: Ideal body weight: 66.1 kg (145 lb 11.6 oz) Adjusted ideal body weight: 80.3 kg (177 lb 0.6 oz)  Assessment   Diagnosis Status  1. Cervicalgia   2. Cervical radiculopathy at C6   3. Cervical radiculopathy (Left)   4. Cervical foraminal stenosis (Bilateral)   5. Cervical disc disorder at C4-C5 level with radiculopathy   6. Cervicogenic headache   7. Chronic shoulder pain (Left)   8. Chronic upper extremity pain (Left)   9. DDD (degenerative disc disease), cervical   10. Radicular shoulder pain   11. Postop check    Controlled Controlled Controlled   Updated Problems: No problems updated.  Plan of Care  Problem-specific:  Assessment and Plan    Cervical radiculopathy   He experiences persistent cervical radiculopathy with arm pain despite two cervical epidural steroid injections. Post-injection numbness lasted two days, with pain returning to previous intensity. An MRI on August 20, 2023, shows canal narrowing at C2-3, C3-4, C4-5, C5-6, and C7-T1. He has no history of neck surgery. Current epidural treatment is ineffective, necessitating a neurosurgical evaluation for decompression options. Refer to a neurosurgeon for cervical spine evaluation and potential surgical intervention. Ensure the MRI report is reviewed before the neurosurgical consultation. Assist him in signing up for MyChart to access the MRI report when available.       Michael Fleming has a current medication list which includes the following long-term medication(s): amlodipine, atorvastatin, losartan, and sildenafil.  Pharmacotherapy (Medications Ordered): No orders  of the defined types were placed in this encounter.  Orders:  Orders Placed This Encounter  Procedures   Ambulatory referral to Neurosurgery    Referral Priority:   Routine    Referral Type:   Surgical    Referral Reason:   Specialty Services Required    Requested Specialty:   Neurosurgery    Number of Visits Requested:   1   Nursing Instructions:    Please complete this patient's postprocedure evaluation.    Scheduling Instructions:     Please complete this patient's postprocedure evaluation.   Follow-up plan:   Return if symptoms worsen or fail to improve.     Interventional Therapies  Risk  Complexity Considerations:   HTN  seizure disorder   NO MRI - Has surgical clips in brain.   Planned  Pending:   Diagnostic/therapeutic left cervical ESI #2    Under consideration:   Diagnostic/therapeutic left cervical ESI #2    Completed:   Diagnostic/therapeutic left cervical ESI x1 (06/22/2023) (100/100/100 for neck and cervicogenic headaches/0 for upper extremity pain) Diagnostic/therapeutic left IA glenohumeral/AC joint inj. x1 (01/03/2023) (100/100/100/100) Therapeutic right suprascapular RFA x1 (01/03/2023) (100/100/0/75)  Diagnostic/therapeutic right suprascapular NB x1 (11/29/2022) (100/100/50/50)    Completed by other providers:   Therapeutic right shoulder arthroscopy, extensive debridement, subacromial decompression, open biceps tenodesis (03/31/2021) by Dr. Dyana Glade [facility])  Diagnostic/therapeutic right subacromial bursa inj. x1 (08/10/2021) by Dr. Dyana Glade [facility])    Therapeutic  Palliative (PRN) options:   None established     Recent Visits Date Type Provider Dept  08/22/23 Procedure visit Renaldo Caroli, MD [facility]  07/06/23 Office Visit Renaldo Caroli, MD [facility]  06/22/23 Procedure visit Renaldo Caroli, MD [facility]  Showing recent visits within past 90 days and meeting all other  requirements Today's Visits Date Type Provider Dept  09/05/23 Office Visit Renaldo Caroli, MD [facility]  Showing today's visits and meeting all other requirements Future Appointments No visits were found meeting these conditions. Showing future appointments within next 90 days and meeting all other requirements  I discussed the assessment and treatment plan with the patient. The patient was provided an opportunity to ask questions and all were answered. The patient agreed with the plan and demonstrated an understanding of the instructions.  Patient advised to call back or seek an in-person evaluation if the symptoms or condition worsens.  Duration of encounter: 30 minutes.  Total time on encounter, as per AMA guidelines included both the face-to-face and non-face-to-face time personally spent by the physician and/or other qualified health care professional(s) on the day of the encounter (includes time in activities that require the physician or other  qualified health care professional and does not include time in activities normally performed by clinical staff). Physician's time may include the following activities when performed: Preparing to see the patient (e.g., pre-charting review of records, searching for previously ordered imaging, lab work, and nerve conduction tests) Review of prior analgesic pharmacotherapies. Reviewing PMP Interpreting ordered tests (e.g., lab work, imaging, nerve conduction tests) Performing post-procedure evaluations, including interpretation of diagnostic procedures Obtaining and/or reviewing separately obtained history Performing a medically appropriate examination and/or evaluation Counseling and educating the patient/family/caregiver Ordering medications, tests, or procedures Referring and communicating with other health care professionals (when not separately reported) Documenting clinical information in the electronic or other health  record Independently interpreting results (not separately reported) and communicating results to the patient/ family/caregiver Care coordination (not separately reported)  Note by: Candi Chafe, MD (TTS and AI technology used. I apologize for any typographical errors that were not detected and corrected.) Date: 09/05/2023; Time: 4:06 PM

## 2023-09-12 ENCOUNTER — Encounter: Payer: Self-pay | Admitting: Orthopaedic Surgery

## 2023-09-12 ENCOUNTER — Ambulatory Visit (INDEPENDENT_AMBULATORY_CARE_PROVIDER_SITE_OTHER): Admitting: Orthopaedic Surgery

## 2023-09-12 DIAGNOSIS — M19012 Primary osteoarthritis, left shoulder: Secondary | ICD-10-CM | POA: Diagnosis not present

## 2023-09-12 DIAGNOSIS — M7542 Impingement syndrome of left shoulder: Secondary | ICD-10-CM | POA: Insufficient documentation

## 2023-09-12 DIAGNOSIS — M75112 Incomplete rotator cuff tear or rupture of left shoulder, not specified as traumatic: Secondary | ICD-10-CM | POA: Diagnosis not present

## 2023-09-12 NOTE — Progress Notes (Signed)
 Office Visit Note   Patient: Michael Fleming           Date of Birth: 09/20/1970           MRN: [id number] Visit Date: 09/12/2023              Requested by: Jerrlyn Morel, NP [phone] [address] [place],  [place] [address] PCP: Jerrlyn Morel, NP   Assessment & Plan: Visit Diagnoses:  1. Arthritis of left acromioclavicular joint   2. Impingement syndrome of left shoulder   3. Partial nontraumatic tear of left rotator cuff     Plan: The cervical spine MRI shows diffuse degenerative findings.  This is currently being managed by his pain doctor.  He recently received cervical epidural steroid injections.  I will defer further treatment to his pain management doctor.  In regards to the shoulder he has had a shoulder injection as well he has not felt much relief from this.  Based on the findings and treatment options he has elected to move forward with left shoulder scope to include extensive debridement, subacromial decompression and distal clavicle excision.  MRI findings consistent with partial rotator cuff tear and severe AC arthritis.  Follow-Up Instructions: No follow-ups on file.   Orders:  No orders of the defined types were placed in this encounter.  No orders of the defined types were placed in this encounter.     Procedures: No procedures performed   Clinical Data: No additional findings.   Subjective: Chief Complaint  Patient presents with   Left Shoulder - Follow-up    MRI review   Neck - Follow-up    MRI review    HPI Patient returns today to discuss recent C-spine and left shoulder MRI scan.  Review of Systems  Constitutional: Negative.   HENT: Negative.    Eyes: Negative.   Respiratory: Negative.    Cardiovascular: Negative.   Gastrointestinal: Negative.   Endocrine: Negative.   Genitourinary: Negative.   Skin: Negative.   Allergic/Immunologic: Negative.   Neurological: Negative.   Hematological: Negative.   Psychiatric/Behavioral:  Negative.    All other systems reviewed and are negative.    Objective: Vital Signs: There were no vitals taken for this visit.  Physical Exam Vitals and nursing note reviewed.  Constitutional:      Appearance: He is well-developed.  Pulmonary:     Effort: Pulmonary effort is normal.  Abdominal:     Palpations: Abdomen is soft.  Skin:    General: Skin is warm.  Neurological:     Mental Status: He is alert and oriented to person, place, and time.  Psychiatric:        Behavior: Behavior normal.        Thought Content: Thought content normal.        Judgment: Judgment normal.     Ortho Exam Examination of left shoulder shows pain with passive and active range of motion.  Manual muscle testing the rotator cuff shows pain without significant weakness.  Pain with impingement signs and pain with cross body adduction. Specialty Comments:  No specialty comments available.  Imaging: No results found.   PMFS History: Patient Active Problem List   Diagnosis Date Noted   Impingement syndrome of left shoulder 09/12/2023   Partial nontraumatic tear of left rotator cuff 09/12/2023   Osteoarthritis of shoulder (Left) 06/22/2023   Arthritis of left acromioclavicular joint 06/22/2023   Cervicalgia 05/23/2023   Abnormal CT scan, cervical spine (12/07/2022)  05/08/2023   Chronic upper extremity pain (Left) 05/08/2023   Cervical radiculopathy (Left) 05/08/2023   Arthralgia of acromioclavicular joint (Right) 01/03/2023   Osteoarthritis of AC (acromioclavicular) joint (Left) 01/03/2023   Arthralgia of lacromioclavicular joint (Left) 01/03/2023   Chronic shoulder pain (Left) 12/15/2022   Chronic low back pain (Bilateral) w/o sciatica 12/15/2022   Lipid screening 10/07/2022   Cervicogenic headache 09/28/2022   DDD (degenerative disc disease), cervical 09/28/2022   Cervical foraminal stenosis (Bilateral) 09/28/2022   Radicular shoulder pain 09/28/2022   Cervical facet hypertrophy  09/28/2022   Cervical radiculopathy at C6 09/28/2022   Cervical disc disorder at C4-C5 level with radiculopathy 09/28/2022   Chronic shoulder pain (1ry area of Pain) (Right) 04/11/2022    Class: Chronic   Partial tear of subscapularis tendon, sequela (Right) 04/11/2022   Traumatic rupture of biceps tendon, sequela (Right) 04/11/2022   Need for immunization against influenza 04/08/2022   Chronic pain syndrome 02/16/2022   Pharmacologic therapy 02/16/2022   Disorder of skeletal system 02/16/2022   Problems influencing health status 02/16/2022   Osteoarthritis of AC (acromioclavicular) joint (Right) 11/12/2021   Tendinopathy of biceps tendon (Right)    Degenerative superior 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) tear of shoulder (Right)    Tendinosis of rotator cuff (Right)    Nontraumatic tear of supraspinatus tendon (Right) 03/04/2021   Hyperlipidemia 02/05/2019   Hypokalemia 02/05/2019   Vitamin D  insufficiency 02/05/2019   Tick bite of groin 11/27/2018   Blurry vision 08/06/2018   Pain in wrist (Right) 03/27/2017   Chronic knee pain (Left) 03/27/2017   HTN (hypertension) 10/23/2016   History of brain surgery 10/23/2016   Past Medical History:  Diagnosis Date   Allergy    Biceps tendon tear    right   Heart palpitations 12/2018   Hyperlipidemia    Hypertension    Hypokalemia 12/2018   Seizures (HCC)    last seizure age 6 due to blood clot- 1981 none since     Family History  Problem Relation Age of Onset   Healthy Mother    Hypertension Father    Colon cancer Neg Hx    Colon polyps Neg Hx    Esophageal cancer Neg Hx    Rectal cancer Neg Hx    Stomach cancer Neg Hx     Past Surgical History:  Procedure Laterality Date   BRAIN SURGERY     1981 blot clot removed   FOOT SURGERY     right bunion removed   KNEE ARTHROSCOPY Left    SHOULDER ARTHROSCOPY WITH SUBACROMIAL DECOMPRESSION AND BICEP TENDON REPAIR Right 03/31/2021   Procedure: RIGHT SHOULDER ARTHROSCOPY,  EXTENSIVE DEBRIDEMENT, SUBACROMIAL DECOMPRESSION, OPEN BICEPS TENODESIS;  Surgeon: Wes Hamman, MD;  Location: [facility];  Service: Orthopedics;  Laterality: Right;   WRIST SURGERY Left    Social History   Occupational History   Not on file  Tobacco Use   Smoking status: Never   Smokeless tobacco: Never  Vaping Use   Vaping status: Never Used  Substance and Sexual Activity   Alcohol use: Yes    Comment: occasional   Drug use: No   Sexual activity: Not Currently

## 2023-09-13 NOTE — Progress Notes (Signed)
 Referring Physician:  Renaldo Caroli, MD [address] [facility] [address] [place],  [place] [address]  Primary Physician:  Jerrlyn Morel, NP  History of Present Illness: 09/18/2023 Michael Fleming is here today with a chief complaint of neck pain bilateral shoulder pain.  He has a history of right shoulder trauma for which she had a traumatic tear of his rotator cuff.  He has had surgery on this, was told that he has a contralateral rotator cuff injury.  He gets neck pain that radiates down his bilateral shoulders, this is neck position dependent.  He also has a history of chronic low back pain.  Feels that he has been dropping things.  He does not feel like his ambulation is off.  He has stable bowel and bladder with no progressive symptoms.  Conservative measures:  Physical therapy: has not participated in Pt for neck Multimodal medical therapy including regular antiinflammatories: none  Injections: 08/22/2023 Left C7-T1 CESI 06/22/2023  Left C7-T1 CESI 05/23/2023 Left C7-T1 CESI  Past Surgery: none  The symptoms are causing a significant impact on the patient's life.   I have utilized the care everywhere function in epic to review the outside records available from external health systems.  Review of Systems:  A 10 point review of systems is negative, except for the pertinent positives and negatives detailed in the HPI.  Past Medical History: Past Medical History:  Diagnosis Date   Allergy    Biceps tendon tear    right   Heart palpitations 12/2018   Hyperlipidemia    Hypertension    Hypokalemia 12/2018   Seizures (HCC)    last seizure age 11 due to blood clot- 1981 none since     Past Surgical History: Past Surgical History:  Procedure Laterality Date   BRAIN SURGERY     1981 blot clot removed   FOOT SURGERY     right bunion removed   KNEE ARTHROSCOPY Left    SHOULDER ARTHROSCOPY WITH SUBACROMIAL DECOMPRESSION AND BICEP TENDON REPAIR Right 03/31/2021    Procedure: RIGHT SHOULDER ARTHROSCOPY, EXTENSIVE DEBRIDEMENT, SUBACROMIAL DECOMPRESSION, OPEN BICEPS TENODESIS;  Surgeon: Wes Hamman, MD;  Location: [facility];  Service: Orthopedics;  Laterality: Right;   WRIST SURGERY Left     Allergies: Allergies as of 09/18/2023   (No Known Allergies)    Medications:  Current Outpatient Medications:    amLODipine  (NORVASC ) 10 MG tablet, Take 1 tablet (10 mg total) by mouth daily., Disp: 90 tablet, Rfl: 1   atorvastatin  (LIPITOR) 40 MG tablet, Take 1 tablet (40 mg total) by mouth daily., Disp: 30 tablet, Rfl: 1   losartan  (COZAAR ) 50 MG tablet, Take 1 tablet (50 mg total) by mouth daily., Disp: 90 tablet, Rfl: 1   sildenafil  (VIAGRA ) 100 MG tablet, Take 0.5-1 tablets (50-100 mg total) by mouth daily as needed for erectil dysfunction., Disp: 30 tablet, Rfl: 3  Social History: Social History   Tobacco Use   Smoking status: Never   Smokeless tobacco: Never  Vaping Use   Vaping status: Never Used  Substance Use Topics   Alcohol use: Yes    Comment: occasional   Drug use: No    Family Medical History: Family History  Problem Relation Age of Onset   Healthy Mother    Hypertension Father    Colon cancer Neg Hx    Colon polyps Neg Hx    Esophageal cancer Neg Hx    Rectal cancer Neg Hx  Stomach cancer Neg Hx     Physical Examination: Vitals:   09/18/23 1111  BP: (!) 148/96    General: Patient is in no apparent distress. Attention to examination is appropriate.  Neck:   Supple.  Full range of motion.  Respiratory: Patient is breathing without any difficulty.   NEUROLOGICAL:     Awake, alert, oriented to person, place, and time.  Speech is clear and fluent.   Cranial Nerves: Pupils equal round and reactive to light.  Facial tone is symmetric.  Facial sensation is symmetric. Shoulder shrug is symmetric. Tongue protrusion is midline.    Strength: Side Biceps Triceps Deltoid Interossei Grip Wrist Ext. Wrist  Flex.  R 5 5 Pain limited from rotator cuff tear 4 5 5 5   L 5 5 Pain limited from rotator cuff tear 4 5 5 5     Reflexes are 2+ and symmetric at the biceps, triceps, brachioradialis, patella and achilles.   Hoffman's is absent. Clonus is absent     Gait is normal.    Imaging: Narrative & Impression  CLINICAL DATA:  Cervical spine pain for 6 months, pain radiating into both shoulders, increased weakness and numbness.   EXAM: MRI CERVICAL SPINE WITHOUT CONTRAST   TECHNIQUE: Multiplanar, multisequence MR imaging of the cervical spine was performed. No intravenous contrast was administered.   COMPARISON:  CT cervical spine 12/06/2022.   FINDINGS: Alignment: Straightening of the cervical lordosis.  No listhesis.   Vertebrae: No bone marrow edema or evidence of fracture. Modic type 2 degenerative endplate changes along the posterior aspect of the C3 inferior endplate. Vertebral body heights are maintained. Hemangioma in the C5 vertebral body. Bone marrow signal intensity is otherwise unremarkable.   Cord: Focal cord signal abnormality at C4-5 with diminished volume at this level likely reflecting myelomalacia. Additional subtle signal abnormality along the dorsal aspect of the cord at C3-4 likely reflecting additional myelomalacia. The cervical cord is otherwise normal in caliber and signal intensity.   Posterior Fossa, vertebral arteries, paraspinal tissues: The partially visualized posterior fossa structures are unremarkable. The visualized paraspinal soft tissues are unremarkable.   Disc levels:   C2-3: Small disc bulge without significant spinal canal stenosis. Bilateral facet arthrosis. Mild foraminal stenosis on the left.   C3-4: Disc osteophyte complex which indents the ventral thecal sac and results in flattening of the ventral cervical cord. Mild thickening of the ligamentum flavum which indents the dorsal thecal sac. Moderate to severe spinal canal stenosis. Cord  volume loss and signal abnormality at this level likely reflecting myelomalacia. Mild cord compression at this level. Uncovertebral hypertrophy and facet arthrosis with severe bilateral foraminal stenosis.   C4-5: Disc osteophyte complex which indents the ventral thecal sac and contacts the ventral cervical cord. Thickening of the ligamentum flavum indents the dorsal thecal sac. Severe spinal canal stenosis and cord compression at this level. Bilateral facet arthrosis and uncovertebral hypertrophy. Severe bilateral foraminal stenosis.   C5-6: Disc osteophyte complex and additional left subarticular disc protrusion which indents the ventral thecal sac. Subtle flattening of the ventral cervical cord. Moderate spinal canal stenosis at this level. Bilateral facet arthrosis. Uncovertebral hypertrophy more pronounced on the left. There is moderate right and severe left foraminal stenosis.   C6-7: Disc osteophyte complex indents the ventral thecal sac with subtle flattening of the ventral cervical cord. Mild thickening of the ligamentum flavum. Moderate spinal canal stenosis. Bilateral facet arthrosis. Uncovertebral hypertrophy more pronounced on the left. Moderate to severe right and severe left foraminal stenosis.  C7-T1: Small disc bulge. No significant spinal canal stenosis. Bilateral facet arthrosis. Mild bilateral foraminal stenosis.   IMPRESSION: Degenerative changes of the cervical spine as above. Disc osteophyte complex at C4-5 contributes to severe spinal canal narrowing and cord compression. Cord signal abnormality and volume loss at C4-5 suggestive of myelomalacia.   Disc osteophyte complex at C3-4 contributes to moderate to severe spinal canal stenosis. Additional subtle cord compression and cord volume loss with signal abnormality along the dorsal cord suggestive of myelomalacia.   Additional moderate spinal canal stenosis at C5-6 and C6-7.   Multilevel severe  foraminal stenosis, as above.     Electronically Signed   By: Denny Flack M.D.   On: 09/11/2023 10:21    I have personally reviewed the images and agree with the above interpretation.  He has 2 levels of cervical spondylosis that are the most significant from C3-4 and C4-5.  There is cord signal change at these levels.  He also has bilateral foraminal stenosis and degenerative disease.  Medical Decision Making/Assessment and Plan: Michael Fleming is a pleasant 53 y.o. male with history of chronic low back pain as well as chronic neck pain and bilateral shoulder pain.  He has a history of a right-sided shoulder trauma for which she has had a rotator cuff repair.  He also has a left-sided rotator cuff which is going to be treated.  This does limit his proximal examination somewhat but otherwise he is strong.  He has some intrinsic hand weakness.  He does not have any clear signs of hyperreflexia.  He has stable walking and quick turning and was able to walk in tandem.  His MRI does demonstrate some multilevel cervical stenosis with multiple levels of cervical T2 signal change, is concerning for myelomalacia.  He has very early signs of cervical myelopathy.  He does not have critical stenosis but does have some disc bulges causing deformation of the spinal cord.  I will plan on getting a cervical flexion-extension film to evaluate for any instability which could be leading to worsened cervical spondylosis and cervical myelopathy.  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  He would like to follow-up on his x-rays prior to making any decisions going forward.  Will plan to continue to follow.  He also has a history of chronic low back pain and bilateral sciatica/lower extremity radiating pain.  He has x-rays which show degenerative disease but has not yet had cross-sectional imaging.   Given his lack of any improvement we will plan to get an MRI of his lumbar spine to evaluate for any compressive disease.  Thank you for involving me in the care of this patient.    Carroll Clamp MD/MSCR Neurosurgery

## 2023-09-18 ENCOUNTER — Ambulatory Visit (INDEPENDENT_AMBULATORY_CARE_PROVIDER_SITE_OTHER): Admitting: Neurosurgery

## 2023-09-18 ENCOUNTER — Encounter: Payer: Self-pay | Admitting: Neurosurgery

## 2023-09-18 ENCOUNTER — Ambulatory Visit
Admission: RE | Admit: 2023-09-18 | Discharge: 2023-09-18 | Disposition: A | Discharge status: Home / Self Care | Attending: Neurosurgery | Admitting: Neurosurgery

## 2023-09-18 ENCOUNTER — Ambulatory Visit
Admission: RE | Admit: 2023-09-18 | Discharge: 2023-09-18 | Disposition: A | Discharge status: Home / Self Care | Source: Ambulatory Visit | Attending: Neurosurgery | Admitting: Neurosurgery

## 2023-09-18 VITALS — BP 148/96 | Ht 67.0 in | Wt 222.0 lb

## 2023-09-18 DIAGNOSIS — M5442 Lumbago with sciatica, left side: Secondary | ICD-10-CM | POA: Diagnosis not present

## 2023-09-18 DIAGNOSIS — M4712 Other spondylosis with myelopathy, cervical region: Secondary | ICD-10-CM

## 2023-09-18 DIAGNOSIS — M4802 Spinal stenosis, cervical region: Secondary | ICD-10-CM | POA: Diagnosis not present

## 2023-09-18 DIAGNOSIS — G8929 Other chronic pain: Secondary | ICD-10-CM | POA: Diagnosis not present

## 2023-09-18 DIAGNOSIS — M47812 Spondylosis without myelopathy or radiculopathy, cervical region: Secondary | ICD-10-CM | POA: Diagnosis not present

## 2023-09-18 DIAGNOSIS — M50121 Cervical disc disorder at C4-C5 level with radiculopathy: Secondary | ICD-10-CM

## 2023-09-18 DIAGNOSIS — M5441 Lumbago with sciatica, right side: Secondary | ICD-10-CM | POA: Diagnosis not present

## 2023-09-18 DIAGNOSIS — M542 Cervicalgia: Secondary | ICD-10-CM

## 2023-09-29 ENCOUNTER — Other Ambulatory Visit: Payer: Self-pay

## 2023-10-02 ENCOUNTER — Ambulatory Visit (INDEPENDENT_AMBULATORY_CARE_PROVIDER_SITE_OTHER): Admitting: Neurosurgery

## 2023-10-02 DIAGNOSIS — M50121 Cervical disc disorder at C4-C5 level with radiculopathy: Secondary | ICD-10-CM | POA: Diagnosis not present

## 2023-10-02 NOTE — Progress Notes (Signed)
 I had a follow-up phone visit with Michael Fleming today.  He was at home and I was in the office.  He gave consent to go forward with a phone visit.  We discussed his x-rays which did not show any pathologic movement in his cervical spine.  We did discuss that he may benefit from a cervical decompression procedure including an arthroplasty or disc replacement since he is not having clear instability.  He is concerned with his shoulders bilaterally as he has had a right sided shoulder surgery and is planned to undergo a left-sided shoulder surgery and is just waiting to hear back on the timing.  I let him know that I did reach out to the shoulder surgeon to discuss timing.  Once I hear back from them I will reach out and let him know the results of that discussion.  Spent a total of 10 minutes on the phone.

## 2023-10-03 ENCOUNTER — Other Ambulatory Visit: Payer: Self-pay

## 2023-10-03 DIAGNOSIS — M5412 Radiculopathy, cervical region: Secondary | ICD-10-CM

## 2023-10-04 ENCOUNTER — Other Ambulatory Visit: Payer: Self-pay

## 2023-10-06 ENCOUNTER — Telehealth: Payer: Self-pay | Admitting: Orthopaedic Surgery

## 2023-10-09 ENCOUNTER — Encounter: Payer: Self-pay | Admitting: Nurse Practitioner

## 2023-10-09 ENCOUNTER — Ambulatory Visit (INDEPENDENT_AMBULATORY_CARE_PROVIDER_SITE_OTHER): Payer: Self-pay | Admitting: Nurse Practitioner

## 2023-10-09 VITALS — BP 130/61 | HR 79 | Temp 98.4°F | Wt 223.8 lb

## 2023-10-09 DIAGNOSIS — N529 Male erectile dysfunction, unspecified: Secondary | ICD-10-CM

## 2023-10-09 DIAGNOSIS — I10 Essential (primary) hypertension: Secondary | ICD-10-CM

## 2023-10-09 DIAGNOSIS — R7301 Impaired fasting glucose: Secondary | ICD-10-CM

## 2023-10-09 DIAGNOSIS — Z760 Encounter for issue of repeat prescription: Secondary | ICD-10-CM | POA: Diagnosis not present

## 2023-10-09 DIAGNOSIS — E7849 Other hyperlipidemia: Secondary | ICD-10-CM | POA: Diagnosis not present

## 2023-10-09 MED ORDER — AMLODIPINE BESYLATE 10 MG PO TABS: 10.0000 mg | ORAL_TABLET | Freq: Every day | ORAL | 1 refills | Status: DC

## 2023-10-09 MED ORDER — SILDENAFIL CITRATE 100 MG PO TABS: 50.0000 mg | ORAL_TABLET | Freq: Every day | ORAL | 3 refills | Status: AC | PRN

## 2023-10-09 MED ORDER — ATORVASTATIN CALCIUM 40 MG PO TABS: 40.0000 mg | ORAL_TABLET | Freq: Every day | ORAL | 1 refills | Status: DC

## 2023-10-09 MED ORDER — LOSARTAN POTASSIUM 50 MG PO TABS: 50.0000 mg | ORAL_TABLET | Freq: Every day | ORAL | 1 refills | Status: DC

## 2023-10-09 NOTE — Progress Notes (Signed)
 Subjective   Patient ID: Michael Fleming, male    DOB: 11-29-1970, 53 y.o.   MRN: [id number]  Chief Complaint  Patient presents with   Medical Management of Chronic Issues    Referring provider: Jerrlyn Morel, NP  Mellie Sprinkle is a 53 y.o. male with Past Medical History: No date: Allergy No date: Biceps tendon tear     Comment:  right 12/2018: Heart palpitations No date: Hyperlipidemia No date: Hypertension 12/2018: Hypokalemia No date: Seizures (HCC)     Comment:  last seizure age 75 due to blood clot- 1981 none since    HPI  Patient presents today for follow-up visit for chronic conditions.  Overall he has been doing well.  Patient is compliant with medications.  He states that he does not have any new issues or concerns today.  He does need blood work today.  Denies f/c/s, n/v/d, hemoptysis, PND, leg swelling. Denies chest pain or edema.     No Known Allergies  Immunization History  Administered Date(s) Administered   Influenza,inj,Quad PF,6+ Mos 01/20/2017, 04/08/2022, 01/24/2023   PFIZER(Purple Top)SARS-COV-2 Vaccination 10/29/2019, 01/07/2020   Tdap 09/29/2016    Tobacco History: Social History   Tobacco Use  Smoking Status Never  Smokeless Tobacco Never   Counseling given: Not Answered   Outpatient Encounter Medications as of 10/09/2023  Medication Sig   [DISCONTINUED] amLODipine  (NORVASC ) 10 MG tablet Take 1 tablet (10 mg total) by mouth daily.   [DISCONTINUED] atorvastatin  (LIPITOR) 40 MG tablet Take 1 tablet (40 mg total) by mouth daily.   [DISCONTINUED] losartan  (COZAAR ) 50 MG tablet Take 1 tablet (50 mg total) by mouth daily.   [DISCONTINUED] sildenafil  (VIAGRA ) 100 MG tablet Take 0.5-1 tablets (50-100 mg total) by mouth daily as needed for erectil dysfunction.   amLODipine  (NORVASC ) 10 MG tablet Take 1 tablet (10 mg total) by mouth daily.   atorvastatin  (LIPITOR) 40 MG tablet Take 1 tablet (40 mg total) by mouth daily.   losartan  (COZAAR )  50 MG tablet Take 1 tablet (50 mg total) by mouth daily.   sildenafil  (VIAGRA ) 100 MG tablet Take 0.5-1 tablets (50-100 mg total) by mouth daily as needed for erectil dysfunction.   No facility-administered encounter medications on file as of 10/09/2023.    Review of Systems  Review of Systems  Constitutional: Negative.   HENT: Negative.    Cardiovascular: Negative.   Gastrointestinal: Negative.   Allergic/Immunologic: Negative.   Neurological: Negative.   Psychiatric/Behavioral: Negative.       Objective:   BP 130/61   Pulse 79   Temp 98.4 F (36.9 C) (Oral)   Wt 223 lb 12.8 oz (101.5 kg)   SpO2 98%   BMI 35.05 kg/m   Wt Readings from Last 5 Encounters:  10/09/23 223 lb 12.8 oz (101.5 kg)  09/18/23 222 lb (100.7 kg)  09/05/23 224 lb (101.6 kg)  08/22/23 224 lb (101.6 kg)  07/31/23 224 lb (101.6 kg)     Physical Exam Vitals and nursing note reviewed.  Constitutional:      General: He is not in acute distress.    Appearance: He is well-developed.  Cardiovascular:     Rate and Rhythm: Normal rate and regular rhythm.  Pulmonary:     Effort: Pulmonary effort is normal.     Breath sounds: Normal breath sounds.  Skin:    General: Skin is warm and dry.  Neurological:     Mental Status: He is alert and oriented to person, place,  and time.       Assessment & Plan:   Impaired fasting blood sugar -     Hemoglobin A1c  Essential hypertension -     amLODIPine  Besylate; Take 1 tablet (10 mg total) by mouth daily.  Dispense: 90 tablet; Refill: 1 -     Losartan  Potassium; Take 1 tablet (50 mg total) by mouth daily.  Dispense: 90 tablet; Refill: 1 -     CBC -     Comprehensive metabolic panel with GFR  Other hyperlipidemia -     Atorvastatin  Calcium ; Take 1 tablet (40 mg total) by mouth daily.  Dispense: 30 tablet; Refill: 1 -     Lipid panel  Medication refill -     Sildenafil  Citrate; Take 0.5-1 tablets (50-100 mg total) by mouth daily as needed for erectil  dysfunction.  Dispense: 30 tablet; Refill: 3  Erectile dysfunction, unspecified erectile dysfunction type -     Sildenafil  Citrate; Take 0.5-1 tablets (50-100 mg total) by mouth daily as needed for erectil dysfunction.  Dispense: 30 tablet; Refill: 3     Return in about 6 months (around 04/10/2024).     Jerrlyn Morel, NP 10/09/2023

## 2023-10-09 NOTE — Patient Instructions (Signed)
 1. Essential hypertension  - amLODipine  (NORVASC ) 10 MG tablet; Take 1 tablet (10 mg total) by mouth daily.  Dispense: 90 tablet; Refill: 1 - losartan  (COZAAR ) 50 MG tablet; Take 1 tablet (50 mg total) by mouth daily.  Dispense: 90 tablet; Refill: 1 - CBC - Comprehensive metabolic panel with GFR  2. Other hyperlipidemia  - atorvastatin  (LIPITOR) 40 MG tablet; Take 1 tablet (40 mg total) by mouth daily.  Dispense: 30 tablet; Refill: 1 - Lipid Panel  3. Medication refill  - sildenafil  (VIAGRA ) 100 MG tablet; Take 0.5-1 tablets (50-100 mg total) by mouth daily as needed for erectil dysfunction.  Dispense: 30 tablet; Refill: 3  4. Erectile dysfunction, unspecified erectile dysfunction type  - sildenafil  (VIAGRA ) 100 MG tablet; Take 0.5-1 tablets (50-100 mg total) by mouth daily as needed for erectil dysfunction.  Dispense: 30 tablet; Refill: 3  5. Impaired fasting blood sugar (Primary)  - Hemoglobin A1c

## 2023-10-10 ENCOUNTER — Ambulatory Visit: Payer: Self-pay | Admitting: Nurse Practitioner

## 2023-10-10 LAB — LIPID PANEL
Chol/HDL Ratio: 4.6 ratio (ref 0.0–5.0)
Cholesterol, Total: 161 mg/dL (ref 100–199)
HDL: 35 mg/dL — ABNORMAL LOW (ref >39)
LDL Chol Calc (NIH): 112 mg/dL — ABNORMAL HIGH (ref 0–99)
Triglycerides: 70 mg/dL (ref 0–149)
VLDL Cholesterol Cal: 14 mg/dL (ref 5–40)

## 2023-10-10 LAB — COMPREHENSIVE METABOLIC PANEL WITH GFR
ALT: 28 IU/L (ref 0–44)
AST: 22 IU/L (ref 0–40)
Albumin: 4.4 g/dL (ref 3.8–4.9)
Alkaline Phosphatase: 112 IU/L (ref 44–121)
BUN/Creatinine Ratio: 10 (ref 9–20)
BUN: 12 mg/dL (ref 6–24)
Bilirubin Total: 0.3 mg/dL (ref 0.0–1.2)
CO2: 22 mmol/L (ref 20–29)
Calcium: 9.6 mg/dL (ref 8.7–10.2)
Chloride: 103 mmol/L (ref 96–106)
Creatinine, Ser: 1.18 mg/dL (ref 0.76–1.27)
Globulin, Total: 2.5 g/dL (ref 1.5–4.5)
Glucose: 111 mg/dL — ABNORMAL HIGH (ref 70–99)
Potassium: 4.5 mmol/L (ref 3.5–5.2)
Sodium: 142 mmol/L (ref 134–144)
Total Protein: 6.9 g/dL (ref 6.0–8.5)
eGFR: 73 mL/min/{1.73_m2} (ref >59)

## 2023-10-10 LAB — CBC
Hematocrit: 42 % (ref 37.5–51.0)
Hemoglobin: 13.9 g/dL (ref 13.0–17.7)
MCH: 29 pg (ref 26.6–33.0)
MCHC: 33.1 g/dL (ref 31.5–35.7)
MCV: 88 fL (ref 79–97)
Platelets: 289 10*3/uL (ref 150–450)
RBC: 4.8 x10E6/uL (ref 4.14–5.80)
RDW: 12.7 % (ref 11.6–15.4)
WBC: 4.8 10*3/uL (ref 3.4–10.8)

## 2023-10-10 LAB — HEMOGLOBIN A1C
Est. average glucose Bld gHb Est-mCnc: 126 mg/dL
Hgb A1c MFr Bld: 6 % — ABNORMAL HIGH (ref 4.8–5.6)

## 2023-10-25 ENCOUNTER — Encounter: Payer: Self-pay | Admitting: Neurosurgery

## 2023-11-01 ENCOUNTER — Other Ambulatory Visit: Payer: Self-pay | Admitting: Physician Assistant

## 2023-11-01 MED ORDER — ONDANSETRON HCL 4 MG PO TABS: 4.0000 mg | ORAL_TABLET | Freq: Three times a day (TID) | ORAL | 0 refills | Status: DC | PRN

## 2023-11-01 MED ORDER — HYDROCODONE-ACETAMINOPHEN 5-325 MG PO TABS: 1.0000 | ORAL_TABLET | Freq: Three times a day (TID) | ORAL | 0 refills | Status: DC | PRN

## 2023-11-02 ENCOUNTER — Encounter: Payer: Self-pay | Admitting: Orthopaedic Surgery

## 2023-11-02 DIAGNOSIS — M65912 Unspecified synovitis and tenosynovitis, left shoulder: Secondary | ICD-10-CM | POA: Diagnosis not present

## 2023-11-02 DIAGNOSIS — M19012 Primary osteoarthritis, left shoulder: Secondary | ICD-10-CM | POA: Diagnosis not present

## 2023-11-02 DIAGNOSIS — M7542 Impingement syndrome of left shoulder: Secondary | ICD-10-CM | POA: Diagnosis not present

## 2023-11-02 DIAGNOSIS — M24112 Other articular cartilage disorders, left shoulder: Secondary | ICD-10-CM | POA: Diagnosis not present

## 2023-11-02 DIAGNOSIS — M7522 Bicipital tendinitis, left shoulder: Secondary | ICD-10-CM | POA: Diagnosis not present

## 2023-11-02 DIAGNOSIS — M75112 Incomplete rotator cuff tear or rupture of left shoulder, not specified as traumatic: Secondary | ICD-10-CM | POA: Diagnosis not present

## 2023-11-02 DIAGNOSIS — G8918 Other acute postprocedural pain: Secondary | ICD-10-CM | POA: Diagnosis not present

## 2023-11-09 ENCOUNTER — Ambulatory Visit
Admission: RE | Admit: 2023-11-09 | Discharge: 2023-11-09 | Disposition: A | Discharge status: Home / Self Care | Source: Ambulatory Visit | Attending: Neurosurgery | Admitting: Neurosurgery

## 2023-11-09 DIAGNOSIS — G8929 Other chronic pain: Secondary | ICD-10-CM

## 2023-11-09 DIAGNOSIS — M48061 Spinal stenosis, lumbar region without neurogenic claudication: Secondary | ICD-10-CM | POA: Diagnosis not present

## 2023-11-10 ENCOUNTER — Ambulatory Visit (INDEPENDENT_AMBULATORY_CARE_PROVIDER_SITE_OTHER): Admitting: Physician Assistant

## 2023-11-10 ENCOUNTER — Other Ambulatory Visit: Payer: Self-pay | Admitting: Physician Assistant

## 2023-11-10 DIAGNOSIS — Z9889 Other specified postprocedural states: Secondary | ICD-10-CM

## 2023-11-10 MED ORDER — HYDROCODONE-ACETAMINOPHEN 5-325 MG PO TABS: 1.0000 | ORAL_TABLET | Freq: Three times a day (TID) | ORAL | 0 refills | Status: DC | PRN

## 2023-11-10 MED ORDER — METHOCARBAMOL 500 MG PO TABS: 500.0000 mg | ORAL_TABLET | Freq: Two times a day (BID) | ORAL | 0 refills | Status: DC | PRN

## 2023-11-10 NOTE — Progress Notes (Signed)
 Post-Op Visit Note   Patient: Michael Fleming           Date of Birth: 01-12-1971           MRN: [id number] Visit Date: 11/10/2023 PCP: Jerrlyn Morel, NP   Assessment & Plan:  Chief Complaint:  Chief Complaint  Patient presents with   Left Shoulder - Routine Post Op, Follow-up   Visit Diagnoses:  1. History of arthroscopy of left shoulder     Plan: Patient is a pleasant 53 year old gentleman who comes in today 1 week status post left shoulder arthroscopic debridement decompression.  He was doing okay until he underwent MRI of his lumbar spine yesterday.  Due to the positioning during his MRI has had increased pain in the left shoulder.  He has been taking Norco without relief.  Examination of the left shoulder reveals fully healed surgical portals with nylon sutures in place.  No evidence of infection or cellulitis.  Fingers are warm well-perfused.  He is neurovascularly intact distally.  Today, sutures were removed and Steri-Strips applied.  Have sent in a referral for outpatient PT.  Sent in Norco and Robaxin  to take for pain.  He may add NSAIDs as needed.  Follow-up in 5 weeks for recheck.  Call with concerns or questions.  Follow-Up Instructions: Return in about 5 weeks (around 12/15/2023).   Orders:  Orders Placed This Encounter  Procedures   Ambulatory referral to Physical Therapy   Meds ordered this encounter  Medications   HYDROcodone -acetaminophen  (NORCO/VICODIN) 5-325 MG tablet    Sig: Take 1 tablet by mouth 3 (three) times daily as needed for moderate pain (pain score 4-6).    Dispense:  21 tablet    Refill:  0   methocarbamol  (ROBAXIN ) 500 MG tablet    Sig: Take 1 tablet (500 mg total) by mouth 2 (two) times daily as needed.    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   Arthritis of left acromioclavicular joint 06/22/2023   Cervicalgia 05/23/2023   Abnormal CT scan, cervical spine (12/07/2022) 05/08/2023   Chronic upper extremity pain (Left) 05/08/2023   Cervical radiculopathy (Left) 05/08/2023   Arthralgia of acromioclavicular joint (Right) 01/03/2023   Osteoarthritis of AC (acromioclavicular) joint (Left) 01/03/2023   Arthralgia of lacromioclavicular joint (Left) 01/03/2023   Chronic shoulder pain (Left) 12/15/2022   Chronic low back pain (Bilateral) w/o sciatica 12/15/2022   Lipid screening 10/07/2022   Cervicogenic headache 09/28/2022   DDD (degenerative disc disease), cervical 09/28/2022   Cervical foraminal stenosis (Bilateral) 09/28/2022   Radicular shoulder pain 09/28/2022   Cervical facet hypertrophy 09/28/2022   Cervical radiculopathy at C6 09/28/2022   Cervical disc disorder at C4-C5 level with radiculopathy 09/28/2022   Chronic shoulder pain (1ry area of Pain) (Right) 04/11/2022    Class: Chronic   Partial tear of subscapularis tendon, sequela (Right) 04/11/2022   Traumatic rupture of biceps tendon, sequela (Right) 04/11/2022   Need for immunization against influenza 04/08/2022   Chronic pain syndrome 02/16/2022   Pharmacologic therapy 02/16/2022   Disorder of skeletal system 02/16/2022   Problems influencing health status 02/16/2022   Osteoarthritis of AC (acromioclavicular) joint (Right) 11/12/2021   Tendinopathy of biceps tendon (Right)  Degenerative superior 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) tear of shoulder (Right)    Tendinosis of rotator cuff (Right)    Nontraumatic tear of supraspinatus tendon (Right) 03/04/2021   Hyperlipidemia 02/05/2019   Hypokalemia 02/05/2019   Vitamin D  insufficiency 02/05/2019   Tick bite of groin 11/27/2018   Blurry vision 08/06/2018   Pain in wrist (Right) 03/27/2017   Chronic knee pain (Left) 03/27/2017   HTN (hypertension)  10/23/2016   History of brain surgery 10/23/2016   Past Medical History:  Diagnosis Date   Allergy    Biceps tendon tear    right   Heart palpitations 12/2018   Hyperlipidemia    Hypertension    Hypokalemia 12/2018   Seizures (HCC)    last seizure age 26 due to blood clot- 1981 none since     Family History  Problem Relation Age of Onset   Healthy Mother    Hypertension Father    Colon cancer Neg Hx    Colon polyps Neg Hx    Esophageal cancer Neg Hx    Rectal cancer Neg Hx    Stomach cancer Neg Hx     Past Surgical History:  Procedure Laterality Date   BRAIN SURGERY     1981 blot clot removed   FOOT SURGERY     right bunion removed   KNEE ARTHROSCOPY Left    SHOULDER ARTHROSCOPY WITH SUBACROMIAL DECOMPRESSION AND BICEP TENDON REPAIR Right 03/31/2021   Procedure: RIGHT SHOULDER ARTHROSCOPY, EXTENSIVE DEBRIDEMENT, SUBACROMIAL DECOMPRESSION, OPEN BICEPS TENODESIS;  Surgeon: Wes Hamman, MD;  Location: [facility];  Service: Orthopedics;  Laterality: Right;   WRIST SURGERY Left    Social History   Occupational History   Not on file  Tobacco Use   Smoking status: Never   Smokeless tobacco: Never  Vaping Use   Vaping status: Never Used  Substance and Sexual Activity   Alcohol use: Yes    Comment: occasional   Drug use: No   Sexual activity: Not Currently

## 2023-11-14 ENCOUNTER — Encounter: Payer: Self-pay | Admitting: Physical Therapy

## 2023-11-14 ENCOUNTER — Other Ambulatory Visit: Payer: Self-pay

## 2023-11-14 ENCOUNTER — Ambulatory Visit: Attending: Nurse Practitioner | Admitting: Physical Therapy

## 2023-11-14 DIAGNOSIS — M25512 Pain in left shoulder: Secondary | ICD-10-CM | POA: Diagnosis not present

## 2023-11-14 DIAGNOSIS — M25612 Stiffness of left shoulder, not elsewhere classified: Secondary | ICD-10-CM | POA: Diagnosis not present

## 2023-11-14 DIAGNOSIS — M6281 Muscle weakness (generalized): Secondary | ICD-10-CM | POA: Insufficient documentation

## 2023-11-14 NOTE — Therapy (Signed)
 OUTPATIENT PHYSICAL THERAPY UPPER EXTREMITY EVALUATION   Patient Name: Michael Fleming MRN: [id number] DOB:[birth year], 53 y.o., male Today's Date: 11/14/2023  END OF SESSION:  PT End of Session - 11/14/23 0902     Visit Number 1    Number of Visits 13    Date for PT Re-Evaluation 12/26/23    PT Start Time 0830    PT Stop Time 0908    PT Time Calculation (min) 38 min    Activity Tolerance Patient tolerated treatment well    Behavior During Therapy [facility] for tasks assessed/performed          Past Medical History:  Diagnosis Date   Allergy    Biceps tendon tear    right   Heart palpitations 12/2018   Hyperlipidemia    Hypertension    Hypokalemia 12/2018   Seizures (HCC)    last seizure age 23 due to blood clot- 1981 none since    Past Surgical History:  Procedure Laterality Date   BRAIN SURGERY     1981 blot clot removed   FOOT SURGERY     right bunion removed   KNEE ARTHROSCOPY Left    SHOULDER ARTHROSCOPY WITH SUBACROMIAL DECOMPRESSION AND BICEP TENDON REPAIR Right 03/31/2021   Procedure: RIGHT SHOULDER ARTHROSCOPY, EXTENSIVE DEBRIDEMENT, SUBACROMIAL DECOMPRESSION, OPEN BICEPS TENODESIS;  Surgeon: Jerri Kay HERO, MD;  Location: [facility];  Service: Orthopedics;  Laterality: Right;   WRIST SURGERY Left    Patient Active Problem List   Diagnosis Date Noted   Impingement syndrome of left shoulder 09/12/2023   Partial nontraumatic tear of left rotator cuff 09/12/2023   Osteoarthritis of shoulder (Left) 06/22/2023   Arthritis of left acromioclavicular joint 06/22/2023   Cervicalgia 05/23/2023   Abnormal CT scan, cervical spine (12/07/2022) 05/08/2023   Chronic upper extremity pain (Left) 05/08/2023   Cervical radiculopathy (Left) 05/08/2023   Arthralgia of acromioclavicular joint (Right) 01/03/2023   Osteoarthritis of AC (acromioclavicular) joint (Left) 01/03/2023   Arthralgia of lacromioclavicular joint (Left) 01/03/2023   Chronic shoulder pain (Left)  12/15/2022   Chronic low back pain (Bilateral) w/o sciatica 12/15/2022   Lipid screening 10/07/2022   Cervicogenic headache 09/28/2022   DDD (degenerative disc disease), cervical 09/28/2022   Cervical foraminal stenosis (Bilateral) 09/28/2022   Radicular shoulder pain 09/28/2022   Cervical facet hypertrophy 09/28/2022   Cervical radiculopathy at C6 09/28/2022   Cervical disc disorder at C4-C5 level with radiculopathy 09/28/2022   Chronic shoulder pain (1ry area of Pain) (Right) 04/11/2022    Class: Chronic   Partial tear of subscapularis tendon, sequela (Right) 04/11/2022   Traumatic rupture of biceps tendon, sequela (Right) 04/11/2022   Need for immunization against influenza 04/08/2022   Chronic pain syndrome 02/16/2022   Pharmacologic therapy 02/16/2022   Disorder of skeletal system 02/16/2022   Problems influencing health status 02/16/2022   Osteoarthritis of AC (acromioclavicular) joint (Right) 11/12/2021   Tendinopathy of biceps tendon (Right)    Degenerative superior 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) tear of shoulder (Right)    Tendinosis of rotator cuff (Right)    Nontraumatic tear of supraspinatus tendon (Right) 03/04/2021   Hyperlipidemia 02/05/2019   Hypokalemia 02/05/2019   Vitamin D  insufficiency 02/05/2019   Tick bite of groin 11/27/2018   Blurry vision 08/06/2018   Pain in wrist (Right) 03/27/2017   Chronic knee pain (Left) 03/27/2017   HTN (hypertension) 10/23/2016   History of brain surgery 10/23/2016    PCP: Oley Bascom RAMAN, NP  REFERRING PROVIDER: Oley Bascom RAMAN,  NP  REFERRING DIAG: S/p left shoulder arthroscopic debridement/decompression  Rationale for Evaluation and Treatment: Rehabilitation  THERAPY DIAG:  Acute pain of left shoulder  Stiffness of left shoulder, not elsewhere classified  Muscle weakness (generalized)  PERTINENT HISTORY: HTN,(hx of brain surgery?) blurry vision,   WEIGHT BEARING RESTRICTIONS: Yes WBAT  FALLS:  Has  patient fallen in last 6 months? No  LIVING ENVIRONMENT: Lives with: lives with their family Lives in: House/apartment Stairs: no issues Has following equipment at home: None  OCCUPATION: Not currently working   PRECAUTIONS: None ---------------------------------------------------------------------------------------------  SUBJECTIVE:                                                                                                                                                                                      SUBJECTIVE STATEMENT: Eval statement 11/14/2023: had a L shoulder decompression on 11/02/2023, feels that he is able to start move and use the L shoulder a little bit more since the surgery. Overall getting better,doesn't have too much pain, primarily just pressure when going to move and at rest. No n/t symptoms, currently 4/10 Hand dominance: Right  RED FLAGS: None   PLOF: Independent  PATIENT GOALS: rehab the shoulder  NEXT MD VISIT: 5 week follow up ---------------------------------------------------------------------------------------------  OBJECTIVE:  Note: Objective measures were completed at Evaluation unless otherwise noted.  DIAGNOSTIC FINDINGS:  IMPRESSION: 1. Moderate supraspinatus tendinosis with partial-thickness longitudinal tear along an approximate 1.9 cm length of the tendon, measuring only 1-2 mm in transverse thickness but along an approximate 13 mm AP dimension of the tendon. No significant tendon retraction. 2. Mild-to-moderate anterior infraspinatus tendinosis. 3. Mild partial-thickness tears of the mid to anterior aspect of the midsubstance of the superior 9 mm of the subscapularis tendon insertion. 4. Mild to moderate anterior supraspinatus muscle atrophy. 5. Moderate proximal long head of the biceps tendinosis. 6. Moderate to high-grade degenerative changes of the acromioclavicular joint. This includes high-grade subchondral  marrow edema and cystic change throughout the clavicular head, suggesting this may represent a source of pain. 7. Mild subacromial/subdeltoid bursitis. 8. Mild-to-moderate glenohumeral cartilage thinning. Curvilinear increased T2 signal within the posterosuperior glenoid labrum suspicious for a degenerative tear.     Electronically Signed   By: Tanda Lyons M.D.   On: 09/05/2023 16:57    PATIENT SURVEYS :  Quick dash: 32 ( 47.73%)  COGNITION: Overall cognitive status: Within functional limits for tasks assessed     SENSATION: WFL  POSTURE: Thoracic kyphosis  UPPER EXTREMITY ROM:   Passive ROM Right eval Left eval  Shoulder flexion WFL 100d!  Shoulder extension [facility]  Shoulder abduction WFL 60d!  Shoulder adduction  Shoulder internal rotation [facility]  Shoulder external rotation [facility] 40d!  Elbow flexion    Elbow extension    Wrist flexion    Wrist extension    Wrist ulnar deviation    Wrist radial deviation    Wrist pronation    Wrist supination    (Blank rows = not tested)  UPPER EXTREMITY [id number] d/t recent surgery and pain levels  MMT Right eval Left eval  Shoulder flexion    Shoulder extension    Shoulder abduction    Shoulder adduction    Shoulder internal rotation    Shoulder external rotation    Middle trapezius    Lower trapezius    Elbow flexion    Elbow extension    Wrist flexion    Wrist extension    Wrist ulnar deviation    Wrist radial deviation    Wrist pronation    Wrist supination    Grip strength (lbs) 120 105  (Blank rows = not tested)  SHOULDER SPECIAL TESTS: Not indicated  JOINT MOBILITY TESTING:  Not indicated  PALPATION:  Hypersensitivity over most proximal surgical scar                                             [facility] Adult PT Treatment:                                                DATE: 11/14/2023 Self Care: POC discussion Pt education   PATIENT EDUCATION: Education details: Pt received education  regarding HEP performance, ADL performance, functional activity tolerance, impairment education, appropriate performance of therapeutic activities. Person educated: Patient Education method: Explanation, Demonstration, Tactile cues, Verbal cues, and Handouts Education comprehension: verbalized understanding and returned demonstration  HOME EXERCISE PROGRAM: Access Code: [email or web address] URL: [email or web address] Date: 11/14/2023 Prepared by: Mabel Kiang  Exercises - Supine Shoulder Abduction AAROM with Dowel  - 1-3 x daily - 7 x weekly - 1-2 sets - 15 reps - 2s hold - Supine Shoulder Flexion with Dowel  - 1-3 x daily - 7 x weekly - 1-2 sets - 15 reps - 2s hold - Supine Shoulder Press AAROM in Abduction with Dowel  - 1-3 x daily - 7 x weekly - 3 sets - 10 reps - 2s hold - Isometric Shoulder Abduction at Wall  - 1 x daily - 7 x weekly - 2-3 sets - 10 reps - 5s hold - Standing Isometric Shoulder External Rotation with Doorway  - 1 x daily - 7 x weekly - 2-3 sets - 10 reps - 5s hold ---------------------------------------------------------------------------------------------  ASSESSMENT:  CLINICAL IMPRESSION: Eval impression (11/14/2023): Pt. attended today's physical therapy session for evaluation of L shoulder s/p debridement and decompression on 11/02/2023. Pt has complaints of Lack of motion and pain with ADLs, overall improving since surgery. Pt has notable deficits with shoulder A/PROM, L shoulder strength, hypersensitivity of surgical scars, and pain levels. Pt would benefit from therapeutic focus on gentle strengthening/mobility work progressing as tolerated until pt can maintain ROM and strength inline with therapeutic goals. Treatment performed today focused on pt education detailed in the objective. Pt demonstrated great understanding of education provided. required minimal v/t cues and no assistance for appropriate performance with today's activities.  Pt requires  the  intervention of skilled outpatient physical therapy to address the aforementioned deficits and progress towards a functional level in line with therapeutic goals.    OBJECTIVE IMPAIRMENTS: decreased mobility, decreased ROM, decreased strength, decreased safety awareness, increased fascial restrictions, impaired UE functional use, postural dysfunction, and pain.   ACTIVITY LIMITATIONS: lifting, sleeping, bathing, toileting, dressing, and reach over head  PARTICIPATION LIMITATIONS: meal prep, cleaning, laundry, driving, shopping, community activity, and occupation  PERSONAL FACTORS: Age, Education, and Time since onset of injury/illness/exacerbation are also affecting patient's functional outcome.   REHAB POTENTIAL: Good  CLINICAL DECISION MAKING: Stable/uncomplicated  EVALUATION COMPLEXITY: Low  GOALS: Goals reviewed with patient? Yes  SHORT TERM GOALS: Target date: 12/05/2023  Pt will be independent with administered HEP to demonstrate the competency necessary for long term managemnet of symptoms at home.  Baseline: Goal status: INITIAL  LONG TERM GOALS: Target date: 12/26/2023  Pt. Will achieve a DASH score of 22 as to demonstrate improvement in self-perceived functional ability with daily activities.  Baseline: 32 Goal status: INITIAL  2.  Pt will report pain levels improving during ADLs to be less than or equal to 2/10 as to demonstrate improved tolerance with daily functional activities such as driving and dressing. Baseline: 4/10 Goal status: INITIAL  3.  Pt will improve MMT scores or global L shoulder to a 4+/5 to demonstrate improvement in strength for quality of motion and activity performance.  Baseline: see obj chart Goal status: INITIAL  4.  Pt will achieve L shoulder flexion/abduction AROM of 160 degrees to demonstrate mobility necessary for independent overhead mobility tasks. Baseline: see obj chart Goal status:  INITIAL ---------------------------------------------------------------------------------------------  PLAN: PT FREQUENCY: 1-2x/week  PT DURATION: 6 weeks  PLANNED INTERVENTIONS: 97110-Therapeutic exercises, 97530- Therapeutic activity, 97112- Neuromuscular re-education, 97535- Self Care, [id number]- Manual therapy, Patient/Family education, Taping, Joint mobilization, Spinal mobilization, Scar mobilization, and DME instructions  PLAN FOR NEXT SESSION: Review HEP, Begin POC as detailed in assessment   Mabel JONELLE Kiang, PT 11/14/2023, 9:12 AM

## 2023-11-15 NOTE — Therapy (Signed)
 OUTPATIENT PHYSICAL THERAPY TREATMENT NOTE   Patient Name: Michael Fleming MRN: [id number] DOB:10-10-1970, 53 y.o., male Today's Date: 11/17/2023  END OF SESSION:  PT End of Session - 11/17/23 0744     Visit Number 2    Number of Visits 13    Date for PT Re-Evaluation 12/26/23    Authorization Type Aetna    Authorization Time Period AETNA 2025/ [employer]-secondary patient fully covered as long as MCD plan is active.    PT Start Time 0745    PT Stop Time 0825    PT Time Calculation (min) 40 min    Activity Tolerance Patient tolerated treatment well    Behavior During Therapy [facility] for tasks assessed/performed           Past Medical History:  Diagnosis Date   Allergy    Biceps tendon tear    right   Heart palpitations 12/2018   Hyperlipidemia    Hypertension    Hypokalemia 12/2018   Seizures (HCC)    last seizure age 21 due to blood clot- 1981 none since    Past Surgical History:  Procedure Laterality Date   BRAIN SURGERY     1981 blot clot removed   FOOT SURGERY     right bunion removed   KNEE ARTHROSCOPY Left    SHOULDER ARTHROSCOPY WITH SUBACROMIAL DECOMPRESSION AND BICEP TENDON REPAIR Right 03/31/2021   Procedure: RIGHT SHOULDER ARTHROSCOPY, EXTENSIVE DEBRIDEMENT, SUBACROMIAL DECOMPRESSION, OPEN BICEPS TENODESIS;  Surgeon: Jerri Kay HERO, MD;  Location: [facility];  Service: Orthopedics;  Laterality: Right;   WRIST SURGERY Left    Patient Active Problem List   Diagnosis Date Noted   Impingement syndrome of left shoulder 09/12/2023   Partial nontraumatic tear of left rotator cuff 09/12/2023   Osteoarthritis of shoulder (Left) 06/22/2023   Arthritis of left acromioclavicular joint 06/22/2023   Cervicalgia 05/23/2023   Abnormal CT scan, cervical spine (12/07/2022) 05/08/2023   Chronic upper extremity pain (Left) 05/08/2023   Cervical radiculopathy (Left) 05/08/2023   Arthralgia of acromioclavicular joint (Right) 01/03/2023   Osteoarthritis  of AC (acromioclavicular) joint (Left) 01/03/2023   Arthralgia of lacromioclavicular joint (Left) 01/03/2023   Chronic shoulder pain (Left) 12/15/2022   Chronic low back pain (Bilateral) w/o sciatica 12/15/2022   Lipid screening 10/07/2022   Cervicogenic headache 09/28/2022   DDD (degenerative disc disease), cervical 09/28/2022   Cervical foraminal stenosis (Bilateral) 09/28/2022   Radicular shoulder pain 09/28/2022   Cervical facet hypertrophy 09/28/2022   Cervical radiculopathy at C6 09/28/2022   Cervical disc disorder at C4-C5 level with radiculopathy 09/28/2022   Chronic shoulder pain (1ry area of Pain) (Right) 04/11/2022    Class: Chronic   Partial tear of subscapularis tendon, sequela (Right) 04/11/2022   Traumatic rupture of biceps tendon, sequela (Right) 04/11/2022   Need for immunization against influenza 04/08/2022   Chronic pain syndrome 02/16/2022   Pharmacologic therapy 02/16/2022   Disorder of skeletal system 02/16/2022   Problems influencing health status 02/16/2022   Osteoarthritis of AC (acromioclavicular) joint (Right) 11/12/2021   Tendinopathy of biceps tendon (Right)    Degenerative superior 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) tear of shoulder (Right)    Tendinosis of rotator cuff (Right)    Nontraumatic tear of supraspinatus tendon (Right) 03/04/2021   Hyperlipidemia 02/05/2019   Hypokalemia 02/05/2019   Vitamin D  insufficiency 02/05/2019   Tick bite of groin 11/27/2018   Blurry vision 08/06/2018   Pain in wrist (Right) 03/27/2017   Chronic knee pain (Left)  03/27/2017   HTN (hypertension) 10/23/2016   History of brain surgery 10/23/2016    PCP: Oley Bascom RAMAN, NP  REFERRING PROVIDER: Oley Bascom RAMAN, NP  REFERRING DIAG: S/p left shoulder arthroscopic debridement/decompression  Rationale for Evaluation and Treatment: Rehabilitation  THERAPY DIAG:  Acute pain of left shoulder  Stiffness of left shoulder, not elsewhere classified  Muscle  weakness (generalized)  PERTINENT HISTORY: HTN,(hx of brain surgery?) blurry vision,   WEIGHT BEARING RESTRICTIONS: Yes WBAT  FALLS:  Has patient fallen in last 6 months? No  LIVING ENVIRONMENT: Lives with: lives with their family Lives in: House/apartment Stairs: no issues Has following equipment at home: None  OCCUPATION: Not currently working   PRECAUTIONS: None ---------------------------------------------------------------------------------------------  SUBJECTIVE:                                                                                                                                                                                      SUBJECTIVE STATEMENT: Arrives to OPPT with elevated pain levels.  Symptoms rated at 6/10.     Eval statement 11/14/2023: had a L shoulder decompression on 11/02/2023, feels that he is able to start move and use the L shoulder a little bit more since the surgery. Overall getting better,doesn't have too much pain, primarily just pressure when going to move and at rest. No n/t symptoms, currently 4/10 Hand dominance: Right  RED FLAGS: None   PLOF: Independent  PATIENT GOALS: rehab the shoulder  NEXT MD VISIT: 5 week follow up ---------------------------------------------------------------------------------------------  OBJECTIVE:  Note: Objective measures were completed at Evaluation unless otherwise noted.  DIAGNOSTIC FINDINGS:  IMPRESSION: 1. Moderate supraspinatus tendinosis with partial-thickness longitudinal tear along an approximate 1.9 cm length of the tendon, measuring only 1-2 mm in transverse thickness but along an approximate 13 mm AP dimension of the tendon. No significant tendon retraction. 2. Mild-to-moderate anterior infraspinatus tendinosis. 3. Mild partial-thickness tears of the mid to anterior aspect of the midsubstance of the superior 9 mm of the subscapularis tendon insertion. 4. Mild to moderate  anterior supraspinatus muscle atrophy. 5. Moderate proximal long head of the biceps tendinosis. 6. Moderate to high-grade degenerative changes of the acromioclavicular joint. This includes high-grade subchondral marrow edema and cystic change throughout the clavicular head, suggesting this may represent a source of pain. 7. Mild subacromial/subdeltoid bursitis. 8. Mild-to-moderate glenohumeral cartilage thinning. Curvilinear increased T2 signal within the posterosuperior glenoid labrum suspicious for a degenerative tear.     Electronically Signed   By: Tanda Lyons M.D.   On: 09/05/2023 16:57    PATIENT SURVEYS :  Quick dash: 32 ( 47.73%)  COGNITION: Overall cognitive status: Within functional limits for tasks assessed  SENSATION: WFL  POSTURE: Thoracic kyphosis  UPPER EXTREMITY ROM:   Passive ROM Right eval Left eval  Shoulder flexion WFL 100d!  Shoulder extension [facility]  Shoulder abduction WFL 60d!  Shoulder adduction    Shoulder internal rotation [facility]  Shoulder external rotation [facility] 40d!  Elbow flexion    Elbow extension    Wrist flexion    Wrist extension    Wrist ulnar deviation    Wrist radial deviation    Wrist pronation    Wrist supination    (Blank rows = not tested)  UPPER EXTREMITY FFU:[id number] d/t recent surgery and pain levels  MMT Right eval Left eval  Shoulder flexion    Shoulder extension    Shoulder abduction    Shoulder adduction    Shoulder internal rotation    Shoulder external rotation    Middle trapezius    Lower trapezius    Elbow flexion    Elbow extension    Wrist flexion    Wrist extension    Wrist ulnar deviation    Wrist radial deviation    Wrist pronation    Wrist supination    Grip strength (lbs) 120 105  (Blank rows = not tested)  SHOULDER SPECIAL TESTS: Not indicated  JOINT MOBILITY TESTING:  Not indicated  PALPATION:  Hypersensitivity over most proximal surgical scar   [facility] Adult PT Treatment:                                                 DATE: 11/17/23 Therapeutic Exercise: Nustep L2 8 min Neuromuscular re-ed: ER YTB 15x2 Seated row YTB 15x2  4 way scapula 15x2 Therapeutic Activity: Supine press UE Ranger 15x2 Supine flexion UE Ranger 15x2 Supine ER UE Ranger 15x2                                         [facility] Adult PT Treatment:                                                DATE: 11/14/2023 Self Care: POC discussion Pt education   PATIENT EDUCATION: Education details: Pt received education regarding HEP performance, ADL performance, functional activity tolerance, impairment education, appropriate performance of therapeutic activities. Person educated: Patient Education method: Explanation, Demonstration, Tactile cues, Verbal cues, and Handouts Education comprehension: verbalized understanding and returned demonstration  HOME EXERCISE PROGRAM: Access Code: [email or web address] URL: [email or web address] Date: 11/14/2023 Prepared by: Mabel Kiang  Exercises - Supine Shoulder Abduction AAROM with Dowel  - 1-3 x daily - 7 x weekly - 1-2 sets - 15 reps - 2s hold - Supine Shoulder Flexion with Dowel  - 1-3 x daily - 7 x weekly - 1-2 sets - 15 reps - 2s hold - Supine Shoulder Press AAROM in Abduction with Dowel  - 1-3 x daily - 7 x weekly - 3 sets - 10 reps - 2s hold - Isometric Shoulder Abduction at Wall  - 1 x daily - 7 x weekly - 2-3 sets - 10 reps - 5s hold - Standing Isometric Shoulder External Rotation with Doorway  - 1 x daily - 7  x weekly - 2-3 sets - 10 reps - 5s hold ---------------------------------------------------------------------------------------------  ASSESSMENT:  CLINICAL IMPRESSION:  Arrives to PT with elevated pain levels.  Denies injury or trauma, may have slept wrong.  Also has concurrent cervical pathology and unsure if it is contributing to symptoms.  ROM increased but not measured.  Introduce scapular mobs against manual resistance but poor  kinesthetic awareness noted.    Eval impression (11/14/2023): Pt. attended today's physical therapy session for evaluation of L shoulder s/p debridement and decompression on 11/02/2023. Pt has complaints of Lack of motion and pain with ADLs, overall improving since surgery. Pt has notable deficits with shoulder A/PROM, L shoulder strength, hypersensitivity of surgical scars, and pain levels. Pt would benefit from therapeutic focus on gentle strengthening/mobility work progressing as tolerated until pt can maintain ROM and strength inline with therapeutic goals. Treatment performed today focused on pt education detailed in the objective. Pt demonstrated great understanding of education provided. required minimal v/t cues and no assistance for appropriate performance with today's activities.  Pt requires the intervention of skilled outpatient physical therapy to address the aforementioned deficits and progress towards a functional level in line with therapeutic goals.    OBJECTIVE IMPAIRMENTS: decreased mobility, decreased ROM, decreased strength, decreased safety awareness, increased fascial restrictions, impaired UE functional use, postural dysfunction, and pain.   ACTIVITY LIMITATIONS: lifting, sleeping, bathing, toileting, dressing, and reach over head  PARTICIPATION LIMITATIONS: meal prep, cleaning, laundry, driving, shopping, community activity, and occupation  PERSONAL FACTORS: Age, Education, and Time since onset of injury/illness/exacerbation are also affecting patient's functional outcome.   REHAB POTENTIAL: Good  CLINICAL DECISION MAKING: Stable/uncomplicated  EVALUATION COMPLEXITY: Low  GOALS: Goals reviewed with patient? Yes  SHORT TERM GOALS: Target date: 12/05/2023  Pt will be independent with administered HEP to demonstrate the competency necessary for long term managemnet of symptoms at home.  Baseline: Goal status: INITIAL  LONG TERM GOALS: Target date: 12/26/2023  Pt.  Will achieve a DASH score of 22 as to demonstrate improvement in self-perceived functional ability with daily activities.  Baseline: 32 Goal status: INITIAL  2.  Pt will report pain levels improving during ADLs to be less than or equal to 2/10 as to demonstrate improved tolerance with daily functional activities such as driving and dressing. Baseline: 4/10 Goal status: INITIAL  3.  Pt will improve MMT scores or global L shoulder to a 4+/5 to demonstrate improvement in strength for quality of motion and activity performance.  Baseline: see obj chart Goal status: INITIAL  4.  Pt will achieve L shoulder flexion/abduction AROM of 160 degrees to demonstrate mobility necessary for independent overhead mobility tasks. Baseline: see obj chart Goal status: INITIAL ---------------------------------------------------------------------------------------------  PLAN: PT FREQUENCY: 1-2x/week  PT DURATION: 6 weeks  PLANNED INTERVENTIONS: 97110-Therapeutic exercises, 97530- Therapeutic activity, 97112- Neuromuscular re-education, 97535- Self Care, [id number]- Manual therapy, Patient/Family education, Taping, Joint mobilization, Spinal mobilization, Scar mobilization, and DME instructions  PLAN FOR NEXT SESSION: Review HEP, Begin POC as detailed in assessment   Reyes CHRISTELLA Kohut, PT 11/17/2023, 8:26 AM

## 2023-11-16 ENCOUNTER — Ambulatory Visit (INDEPENDENT_AMBULATORY_CARE_PROVIDER_SITE_OTHER): Admitting: Orthopedic Surgery

## 2023-11-16 VITALS — BP 147/88 | HR 76 | Ht 67.0 in | Wt 224.0 lb

## 2023-11-16 DIAGNOSIS — M5412 Radiculopathy, cervical region: Secondary | ICD-10-CM | POA: Diagnosis not present

## 2023-11-16 NOTE — Progress Notes (Signed)
 Orthopedic Spine Surgery Office Note  Assessment: Patient is a 53 y.o. male with neck and left shoulder pain, possible radiculopathy   Plan: -Explained that initially conservative treatment is tried as a significant number of patients may experience relief with these treatment modalities. Discussed that the conservative treatments include:  -activity modification  -physical therapy  -over the counter pain medications  -medrol  dosepak  -cervical steroid injections -Patient has tried Tylenol , ibuprofen , cervical ESI, hydrocodone  - Patient had similar symptoms on the right side in bed and did well after her shoulder arthroscopy.  He just underwent left shoulder arthroscopy 2 weeks ago.  He still having pain in the shoulder which would be expected.  I told him lets wait until he recovers from the shoulder surgery before determining if he has residual pain that may be radicular in nature -Patient should return to office in 5-6 weeks, x-rays at next visit: none   Patient expressed understanding of the plan and all questions were answered to the patient's satisfaction.   ___________________________________________________________________________   History:  Patient is a 53 y.o. male who presents today for cervical spine.  Patient with history of bilateral shoulder pain.  He has undergone right shoulder arthroscopy and has not been having pain in that shoulder since he recovered from that surgery.  He recently underwent left shoulder arthroscopy with Dr. Jerri about 2 weeks ago.  He said he is still having pain in the shoulder and has just started working with therapy.  He is taking Norco for pain after his recent surgery.  Not having any pain radiating past the mid humerus on the left side.   Weakness: Denies Difficulty with fine motor skills (e.g., buttoning shirts, handwriting): Denies Symptoms of imbalance: Denies Paresthesias and numbness: Denies Bowel or bladder incontinence: Denies Saddle  anesthesia: Denies  Treatments tried: Tylenol , ibuprofen , cervical ESI, hydrocodone   Review of systems: Denies fevers and chills, night sweats, unexplained weight loss, history of cancer.  Has had pain that wakes him at night  Past medical history: HLD HTN  Allergies: NKDA  Past surgical history:  Brain surgery Right bunion surgery Bilateral shoulder arthroscopy Left knee arthroscopy Left wrist surgery  Social history: Denies use of nicotine product (smoking, vaping, patches, smokeless) Alcohol use: rare Denies recreational drug use   Physical Exam:  BMI of 35.1  General: no acute distress, appears stated age Neurologic: alert, answering questions appropriately, following commands Respiratory: unlabored breathing on room air, symmetric chest rise Psychiatric: appropriate affect, normal cadence to speech   MSK (spine):  -Strength exam      Left  Right Grip strength                5/5  5/5 Interosseus   5/5   5/5 Wrist extension  5/5  5/5 Wrist flexion   5/5  5/5 Elbow flexion   5/5  5/5 Deltoid    3/5  5/5  Did not perform any resistance strength testing on the left side given his recent surgery on that shoulder but was able to demonstrate antigravity strength  -Sensory exam    Sensation intact to light touch in C5-T1 nerve distributions of bilateral upper extremities  -Brachioradialis DTR: 1/4 on the left, 1/4 on the right -Biceps DTR: 1/4 on the left, 1/4 on the right  -Spurling: Negative bilaterally -Hoffman sign: Negative bilaterally -Clonus: No beats bilaterally -Interosseous wasting: None seen -Grip and release test: Negative -Romberg: Negative -Gait: Normal  Imaging: XRs of the cervical spine from 09/18/2023 were independently  reviewed and interpreted, showing disc height loss at C4/5. No other significant degenerative changes. No evidence of instability on flexion/extension views. No fracture or dislocation seen.   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 Central stenosis with bilateral foraminal stenosis at C4/5. T2 cord signal change at C4/5. Left sided foraminal stenosis at C5/6. Left sided foraminal stenosis at C6/7.    Patient name: Michael Fleming Patient MRN: [id number] Date of visit: 11/16/23

## 2023-11-17 ENCOUNTER — Ambulatory Visit

## 2023-11-17 DIAGNOSIS — M25612 Stiffness of left shoulder, not elsewhere classified: Secondary | ICD-10-CM | POA: Diagnosis not present

## 2023-11-17 DIAGNOSIS — M25512 Pain in left shoulder: Secondary | ICD-10-CM | POA: Diagnosis not present

## 2023-11-17 DIAGNOSIS — M6281 Muscle weakness (generalized): Secondary | ICD-10-CM

## 2023-11-20 ENCOUNTER — Other Ambulatory Visit: Payer: Self-pay

## 2023-11-20 ENCOUNTER — Encounter: Payer: Self-pay | Admitting: Physical Therapy

## 2023-11-20 ENCOUNTER — Ambulatory Visit: Admitting: Physical Therapy

## 2023-11-20 DIAGNOSIS — M25512 Pain in left shoulder: Secondary | ICD-10-CM | POA: Diagnosis not present

## 2023-11-20 DIAGNOSIS — M25612 Stiffness of left shoulder, not elsewhere classified: Secondary | ICD-10-CM

## 2023-11-20 DIAGNOSIS — M6281 Muscle weakness (generalized): Secondary | ICD-10-CM | POA: Diagnosis not present

## 2023-11-20 NOTE — Therapy (Signed)
 OUTPATIENT PHYSICAL THERAPY TREATMENT NOTE   Patient Name: Michael Fleming MRN: [id number] DOB:March 06, 1971, 53 y.o., male Today's Date: 11/20/2023  END OF SESSION:  PT End of Session - 11/20/23 0933     Visit Number 3    Number of Visits 13    Date for PT Re-Evaluation 12/26/23    Authorization Type Aetna    Authorization Time Period AETNA 2025/ [employer]-secondary patient fully covered as long as MCD plan is active.    PT Start Time 0930    PT Stop Time 1018    PT Time Calculation (min) 48 min           Past Medical History:  Diagnosis Date   Allergy    Biceps tendon tear    right   Heart palpitations 12/2018   Hyperlipidemia    Hypertension    Hypokalemia 12/2018   Seizures (HCC)    last seizure age 19 due to blood clot- 1981 none since    Past Surgical History:  Procedure Laterality Date   BRAIN SURGERY     1981 blot clot removed   FOOT SURGERY     right bunion removed   KNEE ARTHROSCOPY Left    SHOULDER ARTHROSCOPY WITH SUBACROMIAL DECOMPRESSION AND BICEP TENDON REPAIR Right 03/31/2021   Procedure: RIGHT SHOULDER ARTHROSCOPY, EXTENSIVE DEBRIDEMENT, SUBACROMIAL DECOMPRESSION, OPEN BICEPS TENODESIS;  Surgeon: Jerri Kay HERO, MD;  Location: [facility];  Service: Orthopedics;  Laterality: Right;   WRIST SURGERY Left    Patient Active Problem List   Diagnosis Date Noted   Impingement syndrome of left shoulder 09/12/2023   Partial nontraumatic tear of left rotator cuff 09/12/2023   Osteoarthritis of shoulder (Left) 06/22/2023   Arthritis of left acromioclavicular joint 06/22/2023   Cervicalgia 05/23/2023   Abnormal CT scan, cervical spine (12/07/2022) 05/08/2023   Chronic upper extremity pain (Left) 05/08/2023   Cervical radiculopathy (Left) 05/08/2023   Arthralgia of acromioclavicular joint (Right) 01/03/2023   Osteoarthritis of AC (acromioclavicular) joint (Left) 01/03/2023   Arthralgia of lacromioclavicular joint (Left) 01/03/2023    Chronic shoulder pain (Left) 12/15/2022   Chronic low back pain (Bilateral) w/o sciatica 12/15/2022   Lipid screening 10/07/2022   Cervicogenic headache 09/28/2022   DDD (degenerative disc disease), cervical 09/28/2022   Cervical foraminal stenosis (Bilateral) 09/28/2022   Radicular shoulder pain 09/28/2022   Cervical facet hypertrophy 09/28/2022   Cervical radiculopathy at C6 09/28/2022   Cervical disc disorder at C4-C5 level with radiculopathy 09/28/2022   Chronic shoulder pain (1ry area of Pain) (Right) 04/11/2022    Class: Chronic   Partial tear of subscapularis tendon, sequela (Right) 04/11/2022   Traumatic rupture of biceps tendon, sequela (Right) 04/11/2022   Need for immunization against influenza 04/08/2022   Chronic pain syndrome 02/16/2022   Pharmacologic therapy 02/16/2022   Disorder of skeletal system 02/16/2022   Problems influencing health status 02/16/2022   Osteoarthritis of AC (acromioclavicular) joint (Right) 11/12/2021   Tendinopathy of biceps tendon (Right)    Degenerative superior 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) tear of shoulder (Right)    Tendinosis of rotator cuff (Right)    Nontraumatic tear of supraspinatus tendon (Right) 03/04/2021   Hyperlipidemia 02/05/2019   Hypokalemia 02/05/2019   Vitamin D  insufficiency 02/05/2019   Tick bite of groin 11/27/2018   Blurry vision 08/06/2018   Pain in wrist (Right) 03/27/2017   Chronic knee pain (Left) 03/27/2017   HTN (hypertension) 10/23/2016   History of brain surgery 10/23/2016    PCP: Nichols, Tonya  S, NP  REFERRING PROVIDER: Oley Bascom RAMAN, NP  REFERRING DIAG: S/p left shoulder arthroscopic debridement/decompression  Rationale for Evaluation and Treatment: Rehabilitation  THERAPY DIAG:  Acute pain of left shoulder  Stiffness of left shoulder, not elsewhere classified  PERTINENT HISTORY: HTN,(hx of brain surgery?) blurry vision,   WEIGHT BEARING RESTRICTIONS: Yes WBAT  FALLS:  Has patient  fallen in last 6 months? No  LIVING ENVIRONMENT: Lives with: lives with their family Lives in: House/apartment Stairs: no issues Has following equipment at home: None  OCCUPATION: Not currently working   PRECAUTIONS: None ---------------------------------------------------------------------------------------------  SUBJECTIVE:                                                                                                                                                                                      SUBJECTIVE STATEMENT: 5-6/10 pain today. Shoulder still waked me up at night. Pain is anterior shoulder.    Eval statement 11/14/2023: had a L shoulder decompression on 11/02/2023, feels that he is able to start move and use the L shoulder a little bit more since the surgery. Overall getting better,doesn't have too much pain, primarily just pressure when going to move and at rest. No n/t symptoms, currently 4/10 Hand dominance: Right  RED FLAGS: None   PLOF: Independent  PATIENT GOALS: rehab the shoulder  NEXT MD VISIT: 5 week follow up ---------------------------------------------------------------------------------------------  OBJECTIVE:  Note: Objective measures were completed at Evaluation unless otherwise noted.  DIAGNOSTIC FINDINGS:  IMPRESSION: 1. Moderate supraspinatus tendinosis with partial-thickness longitudinal tear along an approximate 1.9 cm length of the tendon, measuring only 1-2 mm in transverse thickness but along an approximate 13 mm AP dimension of the tendon. No significant tendon retraction. 2. Mild-to-moderate anterior infraspinatus tendinosis. 3. Mild partial-thickness tears of the mid to anterior aspect of the midsubstance of the superior 9 mm of the subscapularis tendon insertion. 4. Mild to moderate anterior supraspinatus muscle atrophy. 5. Moderate proximal long head of the biceps tendinosis. 6. Moderate to high-grade degenerative changes  of the acromioclavicular joint. This includes high-grade subchondral marrow edema and cystic change throughout the clavicular head, suggesting this may represent a source of pain. 7. Mild subacromial/subdeltoid bursitis. 8. Mild-to-moderate glenohumeral cartilage thinning. Curvilinear increased T2 signal within the posterosuperior glenoid labrum suspicious for a degenerative tear.     Electronically Signed   By: Tanda Lyons M.D.   On: 09/05/2023 16:57    PATIENT SURVEYS :  Quick dash: 32 ( 47.73%)  COGNITION: Overall cognitive status: Within functional limits for tasks assessed     SENSATION: WFL  POSTURE: Thoracic kyphosis  UPPER EXTREMITY ROM:   Passive ROM Right eval Left eval  Shoulder flexion WFL 100d!  Shoulder extension [facility]  Shoulder abduction WFL 60d!  Shoulder adduction    Shoulder internal rotation [facility]  Shoulder external rotation [facility] 40d!  Elbow flexion    Elbow extension    Wrist flexion    Wrist extension    Wrist ulnar deviation    Wrist radial deviation    Wrist pronation    Wrist supination    (Blank rows = not tested)  UPPER EXTREMITY [id number] d/t recent surgery and pain levels  MMT Right eval Left eval  Shoulder flexion    Shoulder extension    Shoulder abduction    Shoulder adduction    Shoulder internal rotation    Shoulder external rotation    Middle trapezius    Lower trapezius    Elbow flexion    Elbow extension    Wrist flexion    Wrist extension    Wrist ulnar deviation    Wrist radial deviation    Wrist pronation    Wrist supination    Grip strength (lbs) 120 105  (Blank rows = not tested)  SHOULDER SPECIAL TESTS: Not indicated  JOINT MOBILITY TESTING:  Not indicated  PALPATION:  Hypersensitivity over most proximal surgical scar   [facility] Adult PT Treatment:                                                DATE: 11/20/23 Therapeutic Exercise: Supine Pec stretch  Door way pec stretch  Supine red band  ER 15 x 2  Supine Red Band IR 15 x 2 left only  Manual Therapy: STW left anterior shoulder/pec  Therapeutic Activity: Supine press UE Ranger 15x2 Supine flexion UE Ranger 15x2 Supine ER UE Ranger 15x2 Standing dowel/UE ranger AAROM abduction and flexion x 15 each  Modalities: Ice pack anterior shoulder , left x 10 min     [facility] Adult PT Treatment:                                                DATE: 11/17/23 Therapeutic Exercise: Nustep L2 8 min Neuromuscular re-ed: ER YTB 15x2 Seated row YTB 15x2  4 way scapula 15x2 Therapeutic Activity: Supine press UE Ranger 15x2 Supine flexion UE Ranger 15x2 Supine ER UE Ranger 15x2                                         [facility] Adult PT Treatment:                                                DATE: 11/14/2023 Self Care: POC discussion Pt education   PATIENT EDUCATION: Education details: Pt received education regarding HEP performance, ADL performance, functional activity tolerance, impairment education, appropriate performance of therapeutic activities. Person educated: Patient Education method: Explanation, Demonstration, Tactile cues, Verbal cues, and Handouts Education comprehension: verbalized understanding and returned demonstration  HOME EXERCISE PROGRAM: Access Code: [email or web address] URL: [email or web address] Date: 11/14/2023 Prepared by: Mabel Kiang  Exercises - Supine Shoulder  Abduction AAROM with Dowel  - 1-3 x daily - 7 x weekly - 1-2 sets - 15 reps - 2s hold - Supine Shoulder Flexion with Dowel  - 1-3 x daily - 7 x weekly - 1-2 sets - 15 reps - 2s hold - Supine Shoulder Press AAROM in Abduction with Dowel  - 1-3 x daily - 7 x weekly - 3 sets - 10 reps - 2s hold - Isometric Shoulder Abduction at Wall  - 1 x daily - 7 x weekly - 2-3 sets - 10 reps - 5s hold - Standing Isometric Shoulder External Rotation with Doorway  - 1 x daily - 7 x weekly - 2-3 sets - 10 reps - 5s  hold ---------------------------------------------------------------------------------------------  ASSESSMENT:  CLINICAL IMPRESSION:  Arrives to PT with pain 5-6/10, localized to anterior shoulder. Good response to pec stretching and STW. Continued with AAROM and light strengthening with min complaints of anterior shoulder pain during flexion. Applied ice at end of session to reduce pain and tenderness.     Eval impression (11/14/2023): Pt. attended today's physical therapy session for evaluation of L shoulder s/p debridement and decompression on 11/02/2023. Pt has complaints of Lack of motion and pain with ADLs, overall improving since surgery. Pt has notable deficits with shoulder A/PROM, L shoulder strength, hypersensitivity of surgical scars, and pain levels. Pt would benefit from therapeutic focus on gentle strengthening/mobility work progressing as tolerated until pt can maintain ROM and strength inline with therapeutic goals. Treatment performed today focused on pt education detailed in the objective. Pt demonstrated great understanding of education provided. required minimal v/t cues and no assistance for appropriate performance with today's activities.  Pt requires the intervention of skilled outpatient physical therapy to address the aforementioned deficits and progress towards a functional level in line with therapeutic goals.    OBJECTIVE IMPAIRMENTS: decreased mobility, decreased ROM, decreased strength, decreased safety awareness, increased fascial restrictions, impaired UE functional use, postural dysfunction, and pain.   ACTIVITY LIMITATIONS: lifting, sleeping, bathing, toileting, dressing, and reach over head  PARTICIPATION LIMITATIONS: meal prep, cleaning, laundry, driving, shopping, community activity, and occupation  PERSONAL FACTORS: Age, Education, and Time since onset of injury/illness/exacerbation are also affecting patient's functional outcome.   REHAB POTENTIAL:  Good  CLINICAL DECISION MAKING: Stable/uncomplicated  EVALUATION COMPLEXITY: Low  GOALS: Goals reviewed with patient? Yes  SHORT TERM GOALS: Target date: 12/05/2023  Pt will be independent with administered HEP to demonstrate the competency necessary for long term managemnet of symptoms at home.  Baseline: Goal status: INITIAL  LONG TERM GOALS: Target date: 12/26/2023  Pt. Will achieve a DASH score of 22 as to demonstrate improvement in self-perceived functional ability with daily activities.  Baseline: 32 Goal status: INITIAL  2.  Pt will report pain levels improving during ADLs to be less than or equal to 2/10 as to demonstrate improved tolerance with daily functional activities such as driving and dressing. Baseline: 4/10 Goal status: INITIAL  3.  Pt will improve MMT scores or global L shoulder to a 4+/5 to demonstrate improvement in strength for quality of motion and activity performance.  Baseline: see obj chart Goal status: INITIAL  4.  Pt will achieve L shoulder flexion/abduction AROM of 160 degrees to demonstrate mobility necessary for independent overhead mobility tasks. Baseline: see obj chart Goal status: INITIAL ---------------------------------------------------------------------------------------------  PLAN: PT FREQUENCY: 1-2x/week  PT DURATION: 6 weeks  PLANNED INTERVENTIONS: 97110-Therapeutic exercises, 97530- Therapeutic activity, [id number]- Neuromuscular re-education, 97535- Self Care, [id number]- Manual therapy, Patient/Family education, Taping,  Joint mobilization, Spinal mobilization, Scar mobilization, and DME instructions  PLAN FOR NEXT SESSION: Review HEP, Begin POC as detailed in assessment   Harlene Persons, PTA 11/20/23 10:33 AM Phone: [phone] Fax: [phone]

## 2023-11-22 ENCOUNTER — Ambulatory Visit: Attending: Nurse Practitioner | Admitting: Physical Therapy

## 2023-11-22 ENCOUNTER — Encounter: Payer: Self-pay | Admitting: Physical Therapy

## 2023-11-22 ENCOUNTER — Ambulatory Visit: Payer: Self-pay | Admitting: Neurosurgery

## 2023-11-22 DIAGNOSIS — M25612 Stiffness of left shoulder, not elsewhere classified: Secondary | ICD-10-CM | POA: Diagnosis not present

## 2023-11-22 DIAGNOSIS — M6281 Muscle weakness (generalized): Secondary | ICD-10-CM | POA: Diagnosis not present

## 2023-11-22 DIAGNOSIS — G8929 Other chronic pain: Secondary | ICD-10-CM | POA: Insufficient documentation

## 2023-11-22 DIAGNOSIS — M25512 Pain in left shoulder: Secondary | ICD-10-CM | POA: Insufficient documentation

## 2023-11-22 DIAGNOSIS — M25511 Pain in right shoulder: Secondary | ICD-10-CM | POA: Diagnosis not present

## 2023-11-22 NOTE — Therapy (Signed)
 OUTPATIENT PHYSICAL THERAPY TREATMENT NOTE   Patient Name: Michael Fleming MRN: [id number] DOB:09-05-1970, 53 y.o., male Today's Date: 11/22/2023  END OF SESSION:  PT End of Session - 11/22/23 0831     Visit Number 4    Number of Visits 13    Date for PT Re-Evaluation 12/26/23    Authorization Type Aetna    Authorization Time Period AETNA 2025/ [employer]-secondary patient fully covered as long as MCD plan is active.    PT Start Time 0831    PT Stop Time 0909    PT Time Calculation (min) 38 min    Activity Tolerance Patient tolerated treatment well    Behavior During Therapy [facility] for tasks assessed/performed            Past Medical History:  Diagnosis Date   Allergy    Biceps tendon tear    right   Heart palpitations 12/2018   Hyperlipidemia    Hypertension    Hypokalemia 12/2018   Seizures (HCC)    last seizure age 11 due to blood clot- 1981 none since    Past Surgical History:  Procedure Laterality Date   BRAIN SURGERY     1981 blot clot removed   FOOT SURGERY     right bunion removed   KNEE ARTHROSCOPY Left    SHOULDER ARTHROSCOPY WITH SUBACROMIAL DECOMPRESSION AND BICEP TENDON REPAIR Right 03/31/2021   Procedure: RIGHT SHOULDER ARTHROSCOPY, EXTENSIVE DEBRIDEMENT, SUBACROMIAL DECOMPRESSION, OPEN BICEPS TENODESIS;  Surgeon: Jerri Kay HERO, MD;  Location: [facility];  Service: Orthopedics;  Laterality: Right;   WRIST SURGERY Left    Patient Active Problem List   Diagnosis Date Noted   Impingement syndrome of left shoulder 09/12/2023   Partial nontraumatic tear of left rotator cuff 09/12/2023   Osteoarthritis of shoulder (Left) 06/22/2023   Arthritis of left acromioclavicular joint 06/22/2023   Cervicalgia 05/23/2023   Abnormal CT scan, cervical spine (12/07/2022) 05/08/2023   Chronic upper extremity pain (Left) 05/08/2023   Cervical radiculopathy (Left) 05/08/2023   Arthralgia of acromioclavicular joint (Right) 01/03/2023   Osteoarthritis  of AC (acromioclavicular) joint (Left) 01/03/2023   Arthralgia of lacromioclavicular joint (Left) 01/03/2023   Chronic shoulder pain (Left) 12/15/2022   Chronic low back pain (Bilateral) w/o sciatica 12/15/2022   Lipid screening 10/07/2022   Cervicogenic headache 09/28/2022   DDD (degenerative disc disease), cervical 09/28/2022   Cervical foraminal stenosis (Bilateral) 09/28/2022   Radicular shoulder pain 09/28/2022   Cervical facet hypertrophy 09/28/2022   Cervical radiculopathy at C6 09/28/2022   Cervical disc disorder at C4-C5 level with radiculopathy 09/28/2022   Chronic shoulder pain (1ry area of Pain) (Right) 04/11/2022    Class: Chronic   Partial tear of subscapularis tendon, sequela (Right) 04/11/2022   Traumatic rupture of biceps tendon, sequela (Right) 04/11/2022   Need for immunization against influenza 04/08/2022   Chronic pain syndrome 02/16/2022   Pharmacologic therapy 02/16/2022   Disorder of skeletal system 02/16/2022   Problems influencing health status 02/16/2022   Osteoarthritis of AC (acromioclavicular) joint (Right) 11/12/2021   Tendinopathy of biceps tendon (Right)    Degenerative superior 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) tear of shoulder (Right)    Tendinosis of rotator cuff (Right)    Nontraumatic tear of supraspinatus tendon (Right) 03/04/2021   Hyperlipidemia 02/05/2019   Hypokalemia 02/05/2019   Vitamin D  insufficiency 02/05/2019   Tick bite of groin 11/27/2018   Blurry vision 08/06/2018   Pain in wrist (Right) 03/27/2017   Chronic knee pain (  Left) 03/27/2017   HTN (hypertension) 10/23/2016   History of brain surgery 10/23/2016    PCP: Oley Bascom RAMAN, NP  REFERRING PROVIDER: Oley Bascom RAMAN, NP  REFERRING DIAG: S/p left shoulder arthroscopic debridement/decompression  Rationale for Evaluation and Treatment: Rehabilitation  THERAPY DIAG:  Acute pain of left shoulder  Stiffness of left shoulder, not elsewhere classified  Muscle  weakness (generalized)  Chronic right shoulder pain  PERTINENT HISTORY: HTN,(hx of brain surgery?) blurry vision,   WEIGHT BEARING RESTRICTIONS: Yes WBAT  FALLS:  Has patient fallen in last 6 months? No  LIVING ENVIRONMENT: Lives with: lives with their family Lives in: House/apartment Stairs: no issues Has following equipment at home: None  OCCUPATION: Not currently working   PRECAUTIONS: None ---------------------------------------------------------------------------------------------  SUBJECTIVE:                                                                                                                                                                                      SUBJECTIVE STATEMENT: 5-6/10 pain today. Shoulder still waked me up at night. Pain is anterior shoulder.    Eval statement 11/14/2023: had a L shoulder decompression on 11/02/2023, feels that he is able to start move and use the L shoulder a little bit more since the surgery. Overall getting better,doesn't have too much pain, primarily just pressure when going to move and at rest. No n/t symptoms, currently 4/10 Hand dominance: Right  RED FLAGS: None   PLOF: Independent  PATIENT GOALS: rehab the shoulder  NEXT MD VISIT: 5 week follow up ---------------------------------------------------------------------------------------------  OBJECTIVE:  Note: Objective measures were completed at Evaluation unless otherwise noted.  DIAGNOSTIC FINDINGS:  IMPRESSION: 1. Moderate supraspinatus tendinosis with partial-thickness longitudinal tear along an approximate 1.9 cm length of the tendon, measuring only 1-2 mm in transverse thickness but along an approximate 13 mm AP dimension of the tendon. No significant tendon retraction. 2. Mild-to-moderate anterior infraspinatus tendinosis. 3. Mild partial-thickness tears of the mid to anterior aspect of the midsubstance of the superior 9 mm of the subscapularis  tendon insertion. 4. Mild to moderate anterior supraspinatus muscle atrophy. 5. Moderate proximal long head of the biceps tendinosis. 6. Moderate to high-grade degenerative changes of the acromioclavicular joint. This includes high-grade subchondral marrow edema and cystic change throughout the clavicular head, suggesting this may represent a source of pain. 7. Mild subacromial/subdeltoid bursitis. 8. Mild-to-moderate glenohumeral cartilage thinning. Curvilinear increased T2 signal within the posterosuperior glenoid labrum suspicious for a degenerative tear.     Electronically Signed   By: Tanda Lyons M.D.   On: 09/05/2023 16:57    PATIENT SURVEYS :  Quick dash: 32 ( 47.73%)  COGNITION: Overall cognitive  status: Within functional limits for tasks assessed     SENSATION: WFL  POSTURE: Thoracic kyphosis  UPPER EXTREMITY ROM:   Passive ROM Right eval Left eval 11/22/2023  Shoulder flexion WFL 100d! AROM 160  Shoulder extension [facility]  Shoulder abduction WFL 60d! AROM 160  Shoulder adduction     Shoulder internal rotation [facility]  Shoulder external rotation [facility] 40d! AROM 50  Elbow flexion     Elbow extension     Wrist flexion     Wrist extension     Wrist ulnar deviation     Wrist radial deviation     Wrist pronation     Wrist supination     (Blank rows = not tested)  UPPER EXTREMITY [id number] d/t recent surgery and pain levels  MMT Right eval Left eval  Shoulder flexion    Shoulder extension    Shoulder abduction    Shoulder adduction    Shoulder internal rotation    Shoulder external rotation    Middle trapezius    Lower trapezius    Elbow flexion    Elbow extension    Wrist flexion    Wrist extension    Wrist ulnar deviation    Wrist radial deviation    Wrist pronation    Wrist supination    Grip strength (lbs) 120 105  (Blank rows = not tested)  SHOULDER SPECIAL TESTS: Not indicated  JOINT MOBILITY TESTING:  Not  indicated  PALPATION:  Hypersensitivity over most proximal surgical scar  [facility] Adult PT Treatment:                                                DATE: 11/22/2023  Therapeutic Exercise Passive pec stretch w/Pec minor release Therapeutic Activity: Pulley 5' for overhead mobility Standing Dowel AAROM 2x15 Flexion Abduction ER Supine chest press with dowel and cuff weight 2x15  3-10 lb cuff Standing Abduction AROM 0-160 1x10, ecc control focus    [facility] Adult PT Treatment:                                                DATE: 11/20/23 Therapeutic Exercise: Supine Pec stretch  Door way pec stretch  Supine red band ER 15 x 2  Supine Red Band IR 15 x 2 left only  Manual Therapy: STW left anterior shoulder/pec  Therapeutic Activity: Supine press UE Ranger 15x2 Supine flexion UE Ranger 15x2 Supine ER UE Ranger 15x2 Standing dowel/UE ranger AAROM abduction and flexion x 15 each  Modalities: Ice pack anterior shoulder , left x 10 min     [facility] Adult PT Treatment:                                                DATE: 11/17/23 Therapeutic Exercise: Nustep L2 8 min Neuromuscular re-ed: ER YTB 15x2 Seated row YTB 15x2  4 way scapula 15x2 Therapeutic Activity: Supine press UE Ranger 15x2 Supine flexion UE Ranger 15x2 Supine ER UE Ranger 15x2  [facility] Adult PT Treatment:                                                DATE: 11/14/2023 Self Care: POC discussion Pt education   PATIENT EDUCATION: Education details: Pt received education regarding HEP performance, ADL performance, functional activity tolerance, impairment education, appropriate performance of therapeutic activities. Person educated: Patient Education method: Explanation, Demonstration, Tactile cues, Verbal cues, and Handouts Education comprehension: verbalized understanding and returned demonstration  HOME EXERCISE PROGRAM: Access Code: [email or web address] URL:  [email or web address] Date: 11/14/2023 Prepared by: Mabel Kiang  Exercises - Supine Shoulder Abduction AAROM with Dowel  - 1-3 x daily - 7 x weekly - 1-2 sets - 15 reps - 2s hold - Supine Shoulder Flexion with Dowel  - 1-3 x daily - 7 x weekly - 1-2 sets - 15 reps - 2s hold - Supine Shoulder Press AAROM in Abduction with Dowel  - 1-3 x daily - 7 x weekly - 3 sets - 10 reps - 2s hold - Isometric Shoulder Abduction at Wall  - 1 x daily - 7 x weekly - 2-3 sets - 10 reps - 5s hold - Standing Isometric Shoulder External Rotation with Doorway  - 1 x daily - 7 x weekly - 2-3 sets - 10 reps - 5s hold ---------------------------------------------------------------------------------------------  ASSESSMENT:  CLINICAL IMPRESSION:   Pt attended physical therapy session for continuation of treatment regarding L shoulder decompression/debridement. Today's treatment focused on improvement of  A/AAROM, L shoulder stability, strength within available ROM, and tolerance to mobility to progress towards overhead function. Pt is continuing to improve with AROM reaching 160d of ABD with moderate pain and minimal compensation. STW to pec minor and anterior shoulder gave further relief , continues to require focus on strengthening within available range and tolerance to motion Pt showed great tolerance to administered treatment with no adverse effects by the end of session. Skilled intervention was utilized via activity modification for pt tolerance with task completion, functional progression/regression promoting best outcomes inline with current rehab goals, as well as minimal verbal/tactile cuing alongside no physical assistance for safe and appropriate performance of today's activities.  Begin focus on AROM and continue progressing shoulder stability as tolerated ensuring to limit compensatory motion   Eval impression (11/14/2023): Pt. attended today's physical therapy session for evaluation of L  shoulder s/p debridement and decompression on 11/02/2023. Pt has complaints of Lack of motion and pain with ADLs, overall improving since surgery. Pt has notable deficits with shoulder A/PROM, L shoulder strength, hypersensitivity of surgical scars, and pain levels. Pt would benefit from therapeutic focus on gentle strengthening/mobility work progressing as tolerated until pt can maintain ROM and strength inline with therapeutic goals. Treatment performed today focused on pt education detailed in the objective. Pt demonstrated great understanding of education provided. required minimal v/t cues and no assistance for appropriate performance with today's activities.  Pt requires the intervention of skilled outpatient physical therapy to address the aforementioned deficits and progress towards a functional level in line with therapeutic goals.    OBJECTIVE IMPAIRMENTS: decreased mobility, decreased ROM, decreased strength, decreased safety awareness, increased fascial restrictions, impaired UE functional use, postural dysfunction, and pain.   ACTIVITY LIMITATIONS: lifting, sleeping, bathing, toileting, dressing, and reach over head  PARTICIPATION LIMITATIONS: meal prep, cleaning, laundry, driving, shopping, community activity, and occupation  PERSONAL FACTORS: Age, Education, and  Time since onset of injury/illness/exacerbation are also affecting patient's functional outcome.   REHAB POTENTIAL: Good  CLINICAL DECISION MAKING: Stable/uncomplicated  EVALUATION COMPLEXITY: Low  GOALS: Goals reviewed with patient? Yes  SHORT TERM GOALS: Target date: 12/05/2023  Pt will be independent with administered HEP to demonstrate the competency necessary for long term managemnet of symptoms at home.  Baseline: Goal status: INITIAL  LONG TERM GOALS: Target date: 12/26/2023  Pt. Will achieve a DASH score of 22 as to demonstrate improvement in self-perceived functional ability with daily activities.  Baseline:  32 Goal status: INITIAL  2.  Pt will report pain levels improving during ADLs to be less than or equal to 2/10 as to demonstrate improved tolerance with daily functional activities such as driving and dressing. Baseline: 4/10 Goal status: INITIAL  3.  Pt will improve MMT scores or global L shoulder to a 4+/5 to demonstrate improvement in strength for quality of motion and activity performance.  Baseline: see obj chart Goal status: INITIAL  4.  Pt will achieve L shoulder flexion/abduction AROM of 160 degrees to demonstrate mobility necessary for independent overhead mobility tasks. Baseline: see obj chart Goal status: INITIAL ---------------------------------------------------------------------------------------------  PLAN: PT FREQUENCY: 1-2x/week  PT DURATION: 6 weeks  PLANNED INTERVENTIONS: 97110-Therapeutic exercises, 97530- Therapeutic activity, 97112- Neuromuscular re-education, 97535- Self Care, [id number]- Manual therapy, Patient/Family education, Taping, Joint mobilization, Spinal mobilization, Scar mobilization, and DME instructions  PLAN FOR NEXT SESSION: AROM, limit compensation, update HEP as needed. STW for pain relief as needed  Mabel Kiang, PT, DPT 11/22/2023, 9:10 AM

## 2023-11-27 ENCOUNTER — Encounter: Payer: Self-pay | Admitting: Physical Therapy

## 2023-11-27 ENCOUNTER — Ambulatory Visit: Admitting: Physical Therapy

## 2023-11-27 DIAGNOSIS — M25512 Pain in left shoulder: Secondary | ICD-10-CM | POA: Diagnosis not present

## 2023-11-27 DIAGNOSIS — M25511 Pain in right shoulder: Secondary | ICD-10-CM | POA: Diagnosis not present

## 2023-11-27 DIAGNOSIS — M25612 Stiffness of left shoulder, not elsewhere classified: Secondary | ICD-10-CM | POA: Diagnosis not present

## 2023-11-27 DIAGNOSIS — M6281 Muscle weakness (generalized): Secondary | ICD-10-CM

## 2023-11-27 DIAGNOSIS — G8929 Other chronic pain: Secondary | ICD-10-CM | POA: Diagnosis not present

## 2023-11-27 NOTE — Therapy (Signed)
 OUTPATIENT PHYSICAL THERAPY TREATMENT NOTE   Patient Name: Michael Fleming MRN: [id number] DOB:[birth year], 53 y.o., male Today's Date: 11/27/2023  END OF SESSION:  PT End of Session - 11/27/23 0753     Visit Number 5    Number of Visits 13    Date for PT Re-Evaluation 12/26/23    Authorization Type [employer]etna    Authorization Time Period AETNA 2025/ [employer]-secondary patient fully covered as long as MCD plan is active.    PT Start Time [phone]    PT Stop Time 0831    PT Time Calculation (min) 38 min    Activity Tolerance Patient tolerated treatment well    Behavior During Therapy [facility] for tasks assessed/performed             Past Medical History:  Diagnosis Date   Allergy    Biceps tendon tear    right   Heart palpitations 12/2018   Hyperlipidemia    Hypertension    Hypokalemia 12/2018   Seizures (HCC)    last seizure age 41 due to blood clot- 1981 none since    Past Surgical History:  Procedure Laterality Date   BRAIN SURGERY     1981 blot clot removed   FOOT SURGERY     right bunion removed   KNEE ARTHROSCOPY Left    SHOULDER ARTHROSCOPY WITH SUBACROMIAL DECOMPRESSION AND BICEP TENDON REPAIR Right 03/31/2021   Procedure: RIGHT SHOULDER ARTHROSCOPY, EXTENSIVE DEBRIDEMENT, SUBACROMIAL DECOMPRESSION, OPEN BICEPS TENODESIS;  Surgeon: Jerri Kay HERO, MD;  Location: [facility];  Service: Orthopedics;  Laterality: Right;   WRIST SURGERY Left    Patient Active Problem List   Diagnosis Date Noted   Impingement syndrome of left shoulder 09/12/2023   Partial nontraumatic tear of left rotator cuff 09/12/2023   Osteoarthritis of shoulder (Left) 06/22/2023   Arthritis of left acromioclavicular joint 06/22/2023   Cervicalgia 05/23/2023   Abnormal CT scan, cervical spine (12/07/2022) 05/08/2023   Chronic upper extremity pain (Left) 05/08/2023   Cervical radiculopathy (Left) 05/08/2023   Arthralgia of acromioclavicular joint (Right) 01/03/2023    Osteoarthritis of AC (acromioclavicular) joint (Left) 01/03/2023   Arthralgia of lacromioclavicular joint (Left) 01/03/2023   Chronic shoulder pain (Left) 12/15/2022   Chronic low back pain (Bilateral) w/o sciatica 12/15/2022   Lipid screening 10/07/2022   Cervicogenic headache 09/28/2022   DDD (degenerative disc disease), cervical 09/28/2022   Cervical foraminal stenosis (Bilateral) 09/28/2022   Radicular shoulder pain 09/28/2022   Cervical facet hypertrophy 09/28/2022   Cervical radiculopathy at C6 09/28/2022   Cervical disc disorder at C4-C5 level with radiculopathy 09/28/2022   Chronic shoulder pain (1ry area of Pain) (Right) 04/11/2022    Class: Chronic   Partial tear of subscapularis tendon, sequela (Right) 04/11/2022   Traumatic rupture of biceps tendon, sequela (Right) 04/11/2022   Need for immunization against influenza 04/08/2022   Chronic pain syndrome 02/16/2022   Pharmacologic therapy 02/16/2022   Disorder of skeletal system 02/16/2022   Problems influencing health status 02/16/2022   Osteoarthritis of AC (acromioclavicular) joint (Right) 11/12/2021   Tendinopathy of biceps tendon (Right)    Degenerative superior 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) tear of shoulder (Right)    Tendinosis of rotator cuff (Right)    Nontraumatic tear of supraspinatus tendon (Right) 03/04/2021   Hyperlipidemia 02/05/2019   Hypokalemia 02/05/2019   Vitamin D  insufficiency 02/05/2019   Tick bite of groin 11/27/2018   Blurry vision 08/06/2018   Pain in wrist (Right) 03/27/2017   Chronic knee  pain (Left) 03/27/2017   HTN (hypertension) 10/23/2016   History of brain surgery 10/23/2016    PCP: Oley Bascom RAMAN, NP  REFERRING PROVIDER: Oley Bascom RAMAN, NP  REFERRING DIAG: S/p left shoulder arthroscopic debridement/decompression  Rationale for Evaluation and Treatment: Rehabilitation  THERAPY DIAG:  Acute pain of left shoulder  Stiffness of left shoulder, not elsewhere  classified  Muscle weakness (generalized)  PERTINENT HISTORY: HTN,(hx of brain surgery?) blurry vision,   WEIGHT BEARING RESTRICTIONS: Yes WBAT  FALLS:  Has patient fallen in last 6 months? No  LIVING ENVIRONMENT: Lives with: lives with their family Lives in: House/apartment Stairs: no issues Has following equipment at home: None  OCCUPATION: Not currently working   PRECAUTIONS: None ---------------------------------------------------------------------------------------------  SUBJECTIVE:                                                                                                                                                                                      SUBJECTIVE STATEMENT: 5-6/10 pain today. Shoulder still waked me up at night. Pain is anterior shoulder.    Eval statement 11/14/2023: had a L shoulder decompression on 11/02/2023, feels that he is able to start move and use the L shoulder a little bit more since the surgery. Overall getting better,doesn't have too much pain, primarily just pressure when going to move and at rest. No n/t symptoms, currently 4/10 Hand dominance: Right  RED FLAGS: None   PLOF: Independent  PATIENT GOALS: rehab the shoulder  NEXT MD VISIT: 5 week follow up ---------------------------------------------------------------------------------------------  OBJECTIVE:  Note: Objective measures were completed at Evaluation unless otherwise noted.  DIAGNOSTIC FINDINGS:  IMPRESSION: 1. Moderate supraspinatus tendinosis with partial-thickness longitudinal tear along an approximate 1.9 cm length of the tendon, measuring only 1-2 mm in transverse thickness but along an approximate 13 mm AP dimension of the tendon. No significant tendon retraction. 2. Mild-to-moderate anterior infraspinatus tendinosis. 3. Mild partial-thickness tears of the mid to anterior aspect of the midsubstance of the superior 9 mm of the subscapularis  tendon insertion. 4. Mild to moderate anterior supraspinatus muscle atrophy. 5. Moderate proximal long head of the biceps tendinosis. 6. Moderate to high-grade degenerative changes of the acromioclavicular joint. This includes high-grade subchondral marrow edema and cystic change throughout the clavicular head, suggesting this may represent a source of pain. 7. Mild subacromial/subdeltoid bursitis. 8. Mild-to-moderate glenohumeral cartilage thinning. Curvilinear increased T2 signal within the posterosuperior glenoid labrum suspicious for a degenerative tear.     Electronically Signed   By: Tanda Lyons M.D.   On: 09/05/2023 16:57    PATIENT SURVEYS :  Quick dash: 32 ( 47.73%)  COGNITION: Overall cognitive status: Within functional limits  for tasks assessed     SENSATION: WFL  POSTURE: Thoracic kyphosis  UPPER EXTREMITY ROM:   Passive ROM Right eval Left eval 11/22/2023  Shoulder flexion WFL 100d! AROM 160  Shoulder extension [facility]  Shoulder abduction WFL 60d! AROM 160  Shoulder adduction     Shoulder internal rotation [facility]  Shoulder external rotation [facility] 40d! AROM 50  Elbow flexion     Elbow extension     Wrist flexion     Wrist extension     Wrist ulnar deviation     Wrist radial deviation     Wrist pronation     Wrist supination     (Blank rows = not tested)  UPPER EXTREMITY [id number] d/t recent surgery and pain levels  MMT Right eval Left eval  Shoulder flexion    Shoulder extension    Shoulder abduction    Shoulder adduction    Shoulder internal rotation    Shoulder external rotation    Middle trapezius    Lower trapezius    Elbow flexion    Elbow extension    Wrist flexion    Wrist extension    Wrist ulnar deviation    Wrist radial deviation    Wrist pronation    Wrist supination    Grip strength (lbs) 120 105  (Blank rows = not tested)  SHOULDER SPECIAL TESTS: Not indicated  JOINT MOBILITY TESTING:  Not  indicated  PALPATION:  Hypersensitivity over most proximal surgical scar  [facility] Adult PT Treatment:                                                DATE: 11/27/2023  Therapeutic Exercise: NuStep 8' for activity tolerance Doorway pec stretch  2x1' Passive ABD w/ release of prec minor and CFM of AC capsule  Therapeutic Activity: Abduction at wall w/ foam roller 2x12, hold 4s Banded Chest press at wall w/foam roller 2x15, hold 2s, BTB Omega lat pull down 2x15, hold 2s  [facility] Adult PT Treatment:                                                DATE: 11/22/2023  Therapeutic Exercise Passive pec stretch w/Pec minor release Therapeutic Activity: Pulley 5' for overhead mobility Standing Dowel AAROM 2x15 Flexion Abduction ER Supine chest press with dowel and cuff weight 2x15  3-10 lb cuff Standing Abduction AROM 0-160 1x10, ecc control focus    [facility] Adult PT Treatment:                                                DATE: 11/20/23 Therapeutic Exercise: Supine Pec stretch  Door way pec stretch  Supine red band ER 15 x 2  Supine Red Band IR 15 x 2 left only  Manual Therapy: STW left anterior shoulder/pec  Therapeutic Activity: Supine press UE Ranger 15x2 Supine flexion UE Ranger 15x2 Supine ER UE Ranger 15x2 Standing dowel/UE ranger AAROM abduction and flexion x 15 each  Modalities: Ice pack anterior shoulder , left x 10 min  PATIENT EDUCATION: Education details:  Pt received education regarding HEP performance, ADL performance, functional activity tolerance, impairment education, appropriate performance of therapeutic activities. Person educated: Patient Education method: Explanation, Demonstration, Tactile cues, Verbal cues, and Handouts Education comprehension: verbalized understanding and returned demonstration  HOME EXERCISE PROGRAM: Access Code: [email or web address] URL: [email or web address] Date: 11/14/2023 Prepared by: Mabel Kiang  Exercises - Supine  Shoulder Abduction AAROM with Dowel  - 1-3 x daily - 7 x weekly - 1-2 sets - 15 reps - 2s hold - Supine Shoulder Flexion with Dowel  - 1-3 x daily - 7 x weekly - 1-2 sets - 15 reps - 2s hold - Supine Shoulder Press AAROM in Abduction with Dowel  - 1-3 x daily - 7 x weekly - 3 sets - 10 reps - 2s hold - Isometric Shoulder Abduction at Wall  - 1 x daily - 7 x weekly - 2-3 sets - 10 reps - 5s hold - Standing Isometric Shoulder External Rotation with Doorway  - 1 x daily - 7 x weekly - 2-3 sets - 10 reps - 5s hold ---------------------------------------------------------------------------------------------  ASSESSMENT:  CLINICAL IMPRESSION:   Pt attended physical therapy session for continuation of treatment regarding L shoulder decompression/debridement. Today's treatment focused on improvement of  A/AAROM, L shoulder stability, strength within available ROM, and tolerance to mobility to progress towards overhead function. Pt is continuing to improve with AROM reaching 160d of abduction and 180d of flexion with min-mod pain as well as minimal compensation. Pt gave reports of overuse throughout the weekend as it was feeling good for a bit. Pt showed great tolerance to administered treatment with no adverse effects by the end of session. Skilled intervention was utilized via activity modification for pt tolerance with task completion, functional progression/regression promoting best outcomes inline with current rehab goals, as well as minimal verbal/tactile cuing alongside no physical assistance for safe and appropriate performance of today's activities.  Begin focus on AROM and continue progressing shoulder stability as tolerated ensuring to limit compensatory motion   Eval impression (11/14/2023): Pt. attended today's physical therapy session for evaluation of L shoulder s/p debridement and decompression on 11/02/2023. Pt has complaints of Lack of motion and pain with ADLs, overall improving since surgery.  Pt has notable deficits with shoulder A/PROM, L shoulder strength, hypersensitivity of surgical scars, and pain levels. Pt would benefit from therapeutic focus on gentle strengthening/mobility work progressing as tolerated until pt can maintain ROM and strength inline with therapeutic goals. Treatment performed today focused on pt education detailed in the objective. Pt demonstrated great understanding of education provided. required minimal v/t cues and no assistance for appropriate performance with today's activities.  Pt requires the intervention of skilled outpatient physical therapy to address the aforementioned deficits and progress towards a functional level in line with therapeutic goals.    OBJECTIVE IMPAIRMENTS: decreased mobility, decreased ROM, decreased strength, decreased safety awareness, increased fascial restrictions, impaired UE functional use, postural dysfunction, and pain.   ACTIVITY LIMITATIONS: lifting, sleeping, bathing, toileting, dressing, and reach over head  PARTICIPATION LIMITATIONS: meal prep, cleaning, laundry, driving, shopping, community activity, and occupation  PERSONAL FACTORS: Age, Education, and Time since onset of injury/illness/exacerbation are also affecting patient's functional outcome.   REHAB POTENTIAL: Good  CLINICAL DECISION MAKING: Stable/uncomplicated  EVALUATION COMPLEXITY: Low  GOALS: Goals reviewed with patient? Yes  SHORT TERM GOALS: Target date: 12/05/2023  Pt will be independent with administered HEP to demonstrate the competency necessary for long term managemnet of symptoms at home.  Baseline: Goal status: INITIAL  LONG  TERM GOALS: Target date: 12/26/2023  Pt. Will achieve a DASH score of 22 as to demonstrate improvement in self-perceived functional ability with daily activities.  Baseline: 32 Goal status: INITIAL  2.  Pt will report pain levels improving during ADLs to be less than or equal to 2/10 as to demonstrate improved  tolerance with daily functional activities such as driving and dressing. Baseline: 4/10 Goal status: INITIAL  3.  Pt will improve MMT scores or global L shoulder to a 4+/5 to demonstrate improvement in strength for quality of motion and activity performance.  Baseline: see obj chart Goal status: INITIAL  4.  Pt will achieve L shoulder flexion/abduction AROM of 160 degrees to demonstrate mobility necessary for independent overhead mobility tasks. Baseline: see obj chart Goal status: INITIAL ---------------------------------------------------------------------------------------------  PLAN: PT FREQUENCY: 1-2x/week  PT DURATION: 6 weeks  PLANNED INTERVENTIONS: 97110-Therapeutic exercises, 97530- Therapeutic activity, 97112- Neuromuscular re-education, 97535- Self Care, [id number]- Manual therapy, Patient/Family education, Taping, Joint mobilization, Spinal mobilization, Scar mobilization, and DME instructions  PLAN FOR NEXT SESSION: AROM, limit compensation, update HEP as needed. STW for pain relief as needed  Mabel Kiang, PT, DPT 11/27/2023, 8:34 AM

## 2023-11-29 ENCOUNTER — Ambulatory Visit: Admitting: Physical Therapy

## 2023-12-05 ENCOUNTER — Ambulatory Visit: Admitting: Physical Therapy

## 2023-12-05 ENCOUNTER — Encounter: Payer: Self-pay | Admitting: Physical Therapy

## 2023-12-05 DIAGNOSIS — M25511 Pain in right shoulder: Secondary | ICD-10-CM | POA: Diagnosis not present

## 2023-12-05 DIAGNOSIS — M25612 Stiffness of left shoulder, not elsewhere classified: Secondary | ICD-10-CM | POA: Diagnosis not present

## 2023-12-05 DIAGNOSIS — M25512 Pain in left shoulder: Secondary | ICD-10-CM

## 2023-12-05 DIAGNOSIS — M6281 Muscle weakness (generalized): Secondary | ICD-10-CM

## 2023-12-05 DIAGNOSIS — G8929 Other chronic pain: Secondary | ICD-10-CM | POA: Diagnosis not present

## 2023-12-05 NOTE — Therapy (Signed)
 OUTPATIENT PHYSICAL THERAPY TREATMENT NOTE   Patient Name: Michael Fleming MRN: [id number] DOB:05-Feb-1971, 53 y.o., male Today's Date: 12/05/2023  END OF SESSION:  PT End of Session - 12/05/23 0830     Visit Number 6    Number of Visits 13    Date for PT Re-Evaluation 12/26/23    Authorization Type Aetna    Authorization Time Period AET[employer] 2025/ [employer]-secondary patient fully covered as long as MCD plan is active.    PT Start Time 0830    PT Stop Time 0915    PT Time Calculation (min) 45 min    Activity Tolerance Patient tolerated treatment well    Behavior During Therapy [facility] for tasks assessed/performed             Past Medical History:  Diagnosis Date   Allergy    Biceps tendon tear    right   Heart palpitations 12/2018   Hyperlipidemia    Hypertension    Hypokalemia 12/2018   Seizures (HCC)    last seizure age 83 due to blood clot- 1981 none since    Past Surgical History:  Procedure Laterality Date   BRAIN SURGERY     1981 blot clot removed   FOOT SURGERY     right bunion removed   KNEE ARTHROSCOPY Left    SHOULDER ARTHROSCOPY WITH SUBACROMIAL DECOMPRESSION AND BICEP TENDON REPAIR Right 03/31/2021   Procedure: RIGHT SHOULDER ARTHROSCOPY, EXTENSIVE DEBRIDEMENT, SUBACROMIAL DECOMPRESSION, OPEN BICEPS TENODESIS;  Surgeon: Jerri Kay HERO, MD;  Location: [facility];  Service: Orthopedics;  Laterality: Right;   WRIST SURGERY Left    Patient Active Problem List   Diagnosis Date Noted   Impingement syndrome of left shoulder 09/12/2023   Partial nontraumatic tear of left rotator cuff 09/12/2023   Osteoarthritis of shoulder (Left) 06/22/2023   Arthritis of left acromioclavicular joint 06/22/2023   Cervicalgia 05/23/2023   Abnormal CT scan, cervical spine (12/07/2022) 05/08/2023   Chronic upper extremity pain (Left) 05/08/2023   Cervical radiculopathy (Left) 05/08/2023   Arthralgia of acromioclavicular joint (Right) 01/03/2023    Osteoarthritis of AC (acromioclavicular) joint (Left) 01/03/2023   Arthralgia of lacromioclavicular joint (Left) 01/03/2023   Chronic shoulder pain (Left) 12/15/2022   Chronic low back pain (Bilateral) w/o sciatica 12/15/2022   Lipid screening 10/07/2022   Cervicogenic headache 09/28/2022   DDD (degenerative disc disease), cervical 09/28/2022   Cervical foraminal stenosis (Bilateral) 09/28/2022   Radicular shoulder pain 09/28/2022   Cervical facet hypertrophy 09/28/2022   Cervical radiculopathy at C6 09/28/2022   Cervical disc disorder at C4-C5 level with radiculopathy 09/28/2022   Chronic shoulder pain (1ry area of Pain) (Right) 04/11/2022    Class: Chronic   Partial tear of subscapularis tendon, sequela (Right) 04/11/2022   Traumatic rupture of biceps tendon, sequela (Right) 04/11/2022   Need for immunization against influenza 04/08/2022   Chronic pain syndrome 02/16/2022   Pharmacologic therapy 02/16/2022   Disorder of skeletal system 02/16/2022   Problems influencing health status 02/16/2022   Osteoarthritis of AC (acromioclavicular) joint (Right) 11/12/2021   Tendinopathy of biceps tendon (Right)    Degenerative superior 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) tear of shoulder (Right)    Tendinosis of rotator cuff (Right)    Nontraumatic tear of supraspinatus tendon (Right) 03/04/2021   Hyperlipidemia 02/05/2019   Hypokalemia 02/05/2019   Vitamin D  insufficiency 02/05/2019   Tick bite of groin 11/27/2018   Blurry vision 08/06/2018   Pain in wrist (Right) 03/27/2017   Chronic knee  pain (Left) 03/27/2017   HTN (hypertension) 10/23/2016   History of brain surgery 10/23/2016    PCP: Oley Bascom RAMAN, NP  REFERRING PROVIDER: Oley Bascom RAMAN, NP  REFERRING DIAG: S/p left shoulder arthroscopic debridement/decompression  Rationale for Evaluation and Treatment: Rehabilitation  THERAPY DIAG:  Acute pain of left shoulder  Stiffness of left shoulder, not elsewhere  classified  Muscle weakness (generalized)  PERTINENT HISTORY: HTN,(hx of brain surgery?) blurry vision,   WEIGHT BEARING RESTRICTIONS: Yes WBAT  FALLS:  Has patient fallen in last 6 months? No  LIVING ENVIRONMENT: Lives with: lives with their family Lives in: House/apartment Stairs: no issues Has following equipment at home: None  OCCUPATION: Not currently working   PRECAUTIONS: None ---------------------------------------------------------------------------------------------  SUBJECTIVE:                                                                                                                                                                                      SUBJECTIVE STATEMENT: 5-6/10 pain today. Shoulder still waked me up at night. Pain is anterior shoulder.    Eval statement 11/14/2023: had a L shoulder decompression on 11/02/2023, feels that he is able to start move and use the L shoulder a little bit more since the surgery. Overall getting better,doesn't have too much pain, primarily just pressure when going to move and at rest. No n/t symptoms, currently 4/10 Hand dominance: Right  RED FLAGS: None   PLOF: Independent  PATIENT GOALS: rehab the shoulder  NEXT MD VISIT: 5 week follow up ---------------------------------------------------------------------------------------------  OBJECTIVE:  Note: Objective measures were completed at Evaluation unless otherwise noted.  DIAGNOSTIC FINDINGS:  IMPRESSION: 1. Moderate supraspinatus tendinosis with partial-thickness longitudinal tear along an approximate 1.9 cm length of the tendon, measuring only 1-2 mm in transverse thickness but along an approximate 13 mm AP dimension of the tendon. No significant tendon retraction. 2. Mild-to-moderate anterior infraspinatus tendinosis. 3. Mild partial-thickness tears of the mid to anterior aspect of the midsubstance of the superior 9 mm of the subscapularis  tendon insertion. 4. Mild to moderate anterior supraspinatus muscle atrophy. 5. Moderate proximal long head of the biceps tendinosis. 6. Moderate to high-grade degenerative changes of the acromioclavicular joint. This includes high-grade subchondral marrow edema and cystic change throughout the clavicular head, suggesting this may represent a source of pain. 7. Mild subacromial/subdeltoid bursitis. 8. Mild-to-moderate glenohumeral cartilage thinning. Curvilinear increased T2 signal within the posterosuperior glenoid labrum suspicious for a degenerative tear.     Electronically Signed   By: Tanda Lyons M.D.   On: 09/05/2023 16:57    PATIENT SURVEYS :  Quick dash: 32 ( 47.73%)  COGNITION: Overall cognitive status: Within functional limits  for tasks assessed     SENSATION: WFL  POSTURE: Thoracic kyphosis  UPPER EXTREMITY ROM:   Passive ROM Right eval Left eval 11/22/2023  Shoulder flexion WFL 100d! AROM 160  Shoulder extension [facility]  Shoulder abduction WFL 60d! AROM 160  Shoulder adduction     Shoulder internal rotation [facility]  Shoulder external rotation [facility] 40d! AROM 50  Elbow flexion     Elbow extension     Wrist flexion     Wrist extension     Wrist ulnar deviation     Wrist radial deviation     Wrist pronation     Wrist supination     (Blank rows = not tested)  UPPER EXTREMITY [id number] d/t recent surgery and pain levels  MMT Right eval Left eval  Shoulder flexion    Shoulder extension    Shoulder abduction    Shoulder adduction    Shoulder internal rotation    Shoulder external rotation    Middle trapezius    Lower trapezius    Elbow flexion    Elbow extension    Wrist flexion    Wrist extension    Wrist ulnar deviation    Wrist radial deviation    Wrist pronation    Wrist supination    Grip strength (lbs) 120 105  (Blank rows = not tested)  SHOULDER SPECIAL TESTS: Not indicated  JOINT MOBILITY TESTING:  Not  indicated  PALPATION:  Hypersensitivity over most proximal surgical scar   [facility] Adult PT Treatment:                                                DATE: 12/05/2023  Manual Therapy: Release of L lats, subscapularis and teres group  Therapeutic Activity: Lat pull down mobility 2x30, 35lbs Lat pull down  1x12, 70lb IR/ER switches 1x20 ea. IR isometric w/ lateral steps 1x12 steps HEP update, review    [facility] Adult PT Treatment:                                                DATE: 11/27/2023  Therapeutic Exercise: NuStep 8' for activity tolerance Doorway pec stretch  2x1' Passive ABD w/ release of prec minor and CFM of AC capsule  Therapeutic Activity: Abduction at wall w/ foam roller 2x12, hold 4s Banded Chest press at wall w/foam roller 2x15, hold 2s, BTB Omega lat pull down 2x15, hold 2s  [facility] Adult PT Treatment:                                                DATE: 11/22/2023  Therapeutic Exercise Passive pec stretch w/Pec minor release Therapeutic Activity: Pulley 5' for overhead mobility Standing Dowel AAROM 2x15 Flexion Abduction ER Supine chest press with dowel and cuff weight 2x15  3-10 lb cuff Standing Abduction AROM 0-160 1x10, ecc control focus  PATIENT EDUCATION: Education details: Pt received education regarding HEP performance, ADL performance, functional activity tolerance, impairment education, appropriate performance of therapeutic activities. Person educated: Patient Education method: Explanation, Demonstration, Tactile cues, Verbal cues, and Handouts Education comprehension:  verbalized understanding and returned demonstration  HOME EXERCISE PROGRAM: Access Code: [email or web address] URL: [email or web address] Date: 12/05/2023 Prepared by: Mabel Kiang  Exercises - Standing Shoulder Internal Rotation Stretch with Towel  - 1 x daily - 7 x weekly - 2 sets - 1 reps - 55m hold - Shoulder Internal Rotation in Abduction with Resistance  - 1 x  daily - 5 x weekly - 2-3 sets - 12 reps - 2s hold - Scapular Clock in [place] Position With Resistance Band  - 1 x daily - 5 x weekly - 2-3 sets - 4 reps - 2s hold ---------------------------------------------------------------------------------------------  ASSESSMENT:  CLINICAL IMPRESSION:   Pt attended physical therapy session for continuation of treatment regarding L shoulder decompression. Today's treatment focused on improvement of  pain management and shoulder mobility/stability. Pt demonstrates full AROM with no increase in pain with movement, however, pain continues to be consistent at higher levels 6-9/10. Pt showed great tolerance to administered treatment with no adverse effects by the end of session. Following STW, pt reported a feeling of relief in the posterior aspect of shoulder, Anterior shoulder continues to hurt. Skilled intervention was utilized via activity modification for pt tolerance with task completion, functional progression/regression promoting best outcomes inline with current rehab goals, as well as minimal verbal/tactile cuing alongside no physical assistance for safe and appropriate performance of today's activities. Continue with pain management general strengthening stability, and f/u on HEP updates next session.   Eval impression (11/14/2023): Pt. attended today's physical therapy session for evaluation of L shoulder s/p debridement and decompression on 11/02/2023. Pt has complaints of Lack of motion and pain with ADLs, overall improving since surgery. Pt has notable deficits with shoulder A/PROM, L shoulder strength, hypersensitivity of surgical scars, and pain levels. Pt would benefit from therapeutic focus on gentle strengthening/mobility work progressing as tolerated until pt can maintain ROM and strength inline with therapeutic goals. Treatment performed today focused on pt education detailed in the objective. Pt demonstrated great understanding of education provided.  required minimal v/t cues and no assistance for appropriate performance with today's activities.  Pt requires the intervention of skilled outpatient physical therapy to address the aforementioned deficits and progress towards a functional level in line with therapeutic goals.    OBJECTIVE IMPAIRMENTS: decreased mobility, decreased ROM, decreased strength, decreased safety awareness, increased fascial restrictions, impaired UE functional use, postural dysfunction, and pain.   ACTIVITY LIMITATIONS: lifting, sleeping, bathing, toileting, dressing, and reach over head  PARTICIPATION LIMITATIONS: meal prep, cleaning, laundry, driving, shopping, community activity, and occupation  PERSONAL FACTORS: Age, Education, and Time since onset of injury/illness/exacerbation are also affecting patient's functional outcome.   REHAB POTENTIAL: Good  CLINICAL DECISION MAKING: Stable/uncomplicated  EVALUATION COMPLEXITY: Low  GOALS: Goals reviewed with patient? Yes  SHORT TERM GOALS: Target date: 12/05/2023  Pt will be independent with administered HEP to demonstrate the competency necessary for long term managemnet of symptoms at home.  Baseline: Goal status: INITIAL  LONG TERM GOALS: Target date: 12/26/2023  Pt. Will achieve a DASH score of 22 as to demonstrate improvement in self-perceived functional ability with daily activities.  Baseline: 32 Goal status: INITIAL  2.  Pt will report pain levels improving during ADLs to be less than or equal to 2/10 as to demonstrate improved tolerance with daily functional activities such as driving and dressing. Baseline: 4/10 Goal status: INITIAL  3.  Pt will improve MMT scores or global L shoulder to a 4+/5 to demonstrate improvement in strength for quality of motion and  activity performance.  Baseline: see obj chart Goal status: INITIAL  4.  Pt will achieve L shoulder flexion/abduction AROM of 160 degrees to demonstrate mobility necessary for independent  overhead mobility tasks. Baseline: see obj chart Goal status: INITIAL ---------------------------------------------------------------------------------------------  PLAN: PT FREQUENCY: 1-2x/week  PT DURATION: 6 weeks  PLANNED INTERVENTIONS: 97110-Therapeutic exercises, 97530- Therapeutic activity, 97112- Neuromuscular re-education, 97535- Self Care, [id number]- Manual therapy, Patient/Family education, Taping, Joint mobilization, Spinal mobilization, Scar mobilization, and DME instructions  PLAN FOR NEXT SESSION: AROM, limit compensation, update HEP as needed. STW for pain relief as needed  Mabel Kiang, PT, DPT 12/05/2023, 9:37 AM

## 2023-12-07 ENCOUNTER — Ambulatory Visit: Admitting: Physical Therapy

## 2023-12-07 ENCOUNTER — Encounter: Payer: Self-pay | Admitting: Physical Therapy

## 2023-12-07 DIAGNOSIS — G8929 Other chronic pain: Secondary | ICD-10-CM

## 2023-12-07 DIAGNOSIS — M25512 Pain in left shoulder: Secondary | ICD-10-CM

## 2023-12-07 DIAGNOSIS — M25612 Stiffness of left shoulder, not elsewhere classified: Secondary | ICD-10-CM

## 2023-12-07 DIAGNOSIS — M6281 Muscle weakness (generalized): Secondary | ICD-10-CM

## 2023-12-07 DIAGNOSIS — M25511 Pain in right shoulder: Secondary | ICD-10-CM | POA: Diagnosis not present

## 2023-12-07 NOTE — Therapy (Signed)
 OUTPATIENT PHYSICAL THERAPY TREATMENT NOTE   Patient Name: Michael Fleming MRN: [id number] DOB:September 04, 1970, 53 y.o., male Today's Date: 12/07/2023  END OF SESSION:  PT End of Session - 12/07/23 1034     Visit Number 7    Number of Visits 13    Date for PT Re-Evaluation 12/26/23    Authorization Type Aetna    Authorization Time Period A[employer]NA 2025/ [employer]-secondary patient fully covered as long as MCD plan is active.    PT Start Time 1038    PT Stop Time 1118    PT Time Calculation (min) 40 min    Activity Tolerance Patient tolerated treatment well    Behavior During Therapy WFL for tasks assessed/performed             Past Medical History:  Diagnosis Date   Allergy    Biceps tendon tear    right   Heart palpitations 12/2018   Hyperlipidemia    Hypertension    Hypokalemia 12/2018   Seizures (HCC)    last seizure age 78 due to blood clot- 1981 none since    Past Surgical History:  Procedure Laterality Date   BRAIN SURGERY     1981 blot clot removed   FOOT SURGERY     right bunion removed   KNEE ARTHROSCOPY Left    SHOULDER ARTHROSCOPY WITH SUBACROMIAL DECOMPRESSION AND BICEP TENDON REPAIR Right 03/31/2021   Procedure: RIGHT SHOULDER ARTHROSCOPY, EXTENSIVE DEBRIDEMENT, SUBACROMIAL DECOMPRESSION, OPEN BICEPS TENODESIS;  Surgeon: Jerri Kay HERO, MD;  Location: [facility];  Service: Orthopedics;  Laterality: Right;   WRIST SURGERY Left    Patient Active Problem List   Diagnosis Date Noted   Impingement syndrome of left shoulder 09/12/2023   Partial nontraumatic tear of left rotator cuff 09/12/2023   Osteoarthritis of shoulder (Left) 06/22/2023   Arthritis of left acromioclavicular joint 06/22/2023   Cervicalgia 05/23/2023   Abnormal CT scan, cervical spine (12/07/2022) 05/08/2023   Chronic upper extremity pain (Left) 05/08/2023   Cervical radiculopathy (Left) 05/08/2023   Arthralgia of acromioclavicular joint (Right) 01/03/2023    Osteoarthritis of AC (acromioclavicular) joint (Left) 01/03/2023   Arthralgia of lacromioclavicular joint (Left) 01/03/2023   Chronic shoulder pain (Left) 12/15/2022   Chronic low back pain (Bilateral) w/o sciatica 12/15/2022   Lipid screening 10/07/2022   Cervicogenic headache 09/28/2022   DDD (degenerative disc disease), cervical 09/28/2022   Cervical foraminal stenosis (Bilateral) 09/28/2022   Radicular shoulder pain 09/28/2022   Cervical facet hypertrophy 09/28/2022   Cervical radiculopathy at C6 09/28/2022   Cervical disc disorder at C4-C5 level with radiculopathy 09/28/2022   Chronic shoulder pain (1ry area of Pain) (Right) 04/11/2022    Class: Chronic   Partial tear of subscapularis tendon, sequela (Right) 04/11/2022   Traumatic rupture of biceps tendon, sequela (Right) 04/11/2022   Need for immunization against influenza 04/08/2022   Chronic pain syndrome 02/16/2022   Pharmacologic therapy 02/16/2022   Disorder of skeletal system 02/16/2022   Problems influencing health status 02/16/2022   Osteoarthritis of AC (acromioclavicular) joint (Right) 11/12/2021   Tendinopathy of biceps tendon (Right)    Degenerative superior 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) tear of shoulder (Right)    Tendinosis of rotator cuff (Right)    Nontraumatic tear of supraspinatus tendon (Right) 03/04/2021   Hyperlipidemia 02/05/2019   Hypokalemia 02/05/2019   Vitamin D  insufficiency 02/05/2019   Tick bite of groin 11/27/2018   Blurry vision 08/06/2018   Pain in wrist (Right) 03/27/2017   Chronic knee  pain (Left) 03/27/2017   HTN (hypertension) 10/23/2016   History of brain surgery 10/23/2016    PCP: Oley Bascom RAMAN, NP  REFERRING PROVIDER: Oley Bascom RAMAN, NP  REFERRING DIAG: S/p left shoulder arthroscopic debridement/decompression  Rationale for Evaluation and Treatment: Rehabilitation  THERAPY DIAG:  Stiffness of left shoulder, not elsewhere classified  Acute pain of left  shoulder  Muscle weakness (generalized)  Chronic right shoulder pain  PERTINENT HISTORY: HTN,(hx of brain surgery?) blurry vision,   WEIGHT BEARING RESTRICTIONS: Yes WBAT  FALLS:  Has patient fallen in last 6 months? No  LIVING ENVIRONMENT: Lives with: lives with their family Lives in: House/apartment Stairs: no issues Has following equipment at home: None  OCCUPATION: Not currently working   PRECAUTIONS: None ---------------------------------------------------------------------------------------------  SUBJECTIVE:                                                                                                                                                                                      SUBJECTIVE STATEMENT: 5-6/10 pain today. Shoulder still waked me up at night. Pain is anterior shoulder.    Eval statement 11/14/2023: had a L shoulder decompression on 11/02/2023, feels that he is able to start move and use the L shoulder a little bit more since the surgery. Overall getting better,doesn't have too much pain, primarily just pressure when going to move and at rest. No n/t symptoms, currently 4/10 Hand dominance: Right  RED FLAGS: None   PLOF: Independent  PATIENT GOALS: rehab the shoulder  NEXT MD VISIT: 5 week follow up ---------------------------------------------------------------------------------------------  OBJECTIVE:  Note: Objective measures were completed at Evaluation unless otherwise noted.  DIAGNOSTIC FINDINGS:  IMPRESSION: 1. Moderate supraspinatus tendinosis with partial-thickness longitudinal tear along an approximate 1.9 cm length of the tendon, measuring only 1-2 mm in transverse thickness but along an approximate 13 mm AP dimension of the tendon. No significant tendon retraction. 2. Mild-to-moderate anterior infraspinatus tendinosis. 3. Mild partial-thickness tears of the mid to anterior aspect of the midsubstance of the superior 9 mm of  the subscapularis tendon insertion. 4. Mild to moderate anterior supraspinatus muscle atrophy. 5. Moderate proximal long head of the biceps tendinosis. 6. Moderate to high-grade degenerative changes of the acromioclavicular joint. This includes high-grade subchondral marrow edema and cystic change throughout the clavicular head, suggesting this may represent a source of pain. 7. Mild subacromial/subdeltoid bursitis. 8. Mild-to-moderate glenohumeral cartilage thinning. Curvilinear increased T2 signal within the posterosuperior glenoid labrum suspicious for a degenerative tear.     Electronically Signed   By: Tanda Lyons M.D.   On: 09/05/2023 16:57    PATIENT SURVEYS :  Quick dash: 32 ( 47.73%)  COGNITION: Overall  cognitive status: Within functional limits for tasks assessed     SENSATION: WFL  POSTURE: Thoracic kyphosis  UPPER EXTREMITY ROM:   Passive ROM Right eval Left eval 11/22/2023  Shoulder flexion WFL 100d! AROM 160  Shoulder extension [facility]  Shoulder abduction WFL 60d! AROM 160  Shoulder adduction     Shoulder internal rotation [facility]  Shoulder external rotation [facility] 40d! AROM 50  Elbow flexion     Elbow extension     Wrist flexion     Wrist extension     Wrist ulnar deviation     Wrist radial deviation     Wrist pronation     Wrist supination     (Blank rows = not tested)  UPPER EXTREMITY [id number] d/t recent surgery and pain levels  MMT Right eval Left eval  Shoulder flexion    Shoulder extension    Shoulder abduction    Shoulder adduction    Shoulder internal rotation    Shoulder external rotation    Middle trapezius    Lower trapezius    Elbow flexion    Elbow extension    Wrist flexion    Wrist extension    Wrist ulnar deviation    Wrist radial deviation    Wrist pronation    Wrist supination    Grip strength (lbs) 120 105  (Blank rows = not tested)  SHOULDER SPECIAL TESTS: Not indicated  JOINT MOBILITY TESTING:   Not indicated  PALPATION:  Hypersensitivity over most proximal surgical scar   [facility] Adult PT Treatment:                                                DATE: 12/07/2023  Therapeutic Exercise: PROM into ER/IR/ABD/ Release of L lats, subscapularis and teres group Doorway pec stretch 2x1' Therapeutic Activity: NuStep 6' for activity tolerance  Foam roller flex/abd 2x15 ea. POC discussion and pt education regarding exercise dosage, at home pacing, and appropriate physiological exercise response     [facility] Adult PT Treatment:                                                DATE: 12/05/2023  Manual Therapy: Release of L lats, subscapularis and teres group  Therapeutic Activity: Lat pull down mobility 2x30, 35lbs Lat pull down  1x12, 70lb IR/ER switches 1x20 ea. IR isometric w/ lateral steps 1x12 steps HEP update, review    [facility] Adult PT Treatment:                                                DATE: 11/27/2023  Therapeutic Exercise: NuStep 8' for activity tolerance Doorway pec stretch  2x1' Passive ABD w/ release of prec minor and CFM of AC capsule  Therapeutic Activity: Abduction at wall w/ foam roller 2x12, hold 4s Banded Chest press at wall w/foam roller 2x15, hold 2s, BTB Omega lat pull down 2x15, hold 2s  PATIENT EDUCATION: Education details: Pt received education regarding HEP performance, ADL performance, functional activity tolerance, impairment education, appropriate performance of therapeutic activities. Person educated: Patient Education  method: Explanation, Demonstration, Tactile cues, Verbal cues, and Handouts Education comprehension: verbalized understanding and returned demonstration  HOME EXERCISE PROGRAM: Access Code: [email or web address] URL: [email or web address] Date: 12/05/2023 Prepared by: Mabel Kiang  Exercises - Standing Shoulder Internal Rotation Stretch with Towel  - 1 x daily - 7 x weekly - 2 sets - 1 reps - 1m hold - Shoulder  Internal Rotation in Abduction with Resistance  - 1 x daily - 5 x weekly - 2-3 sets - 12 reps - 2s hold - Scapular Clock in [place] Position With Resistance Band  - 1 x daily - 5 x weekly - 2-3 sets - 4 reps - 2s hold ---------------------------------------------------------------------------------------------  ASSESSMENT:  CLINICAL IMPRESSION:   Pt attended physical therapy session for continuation of treatment regarding L shoulder decompression. Today's treatment focused on improvement of  pain management and shoulder mobility/motility. Pt demonstrates full AROM with no increase in pain with movement, however, pain continues to be consistent at higher levels 6-9/10. Pt showed great tolerance to administered treatment with no adverse effects by the end of session. Following STW, pt reported a feeling of relief in the inferior and posterior aspect of shoulder, Skilled intervention was utilized via activity modification for pt tolerance with task completion, functional progression/regression promoting best outcomes inline with current rehab goals, as well as minimal verbal/tactile cuing alongside no physical assistance for safe and appropriate performance of today's activities. Continue with pain management general strengthening stability, and f/u on HEP updates next session.   Eval impression (11/14/2023): Pt. attended today's physical therapy session for evaluation of L shoulder s/p debridement and decompression on 11/02/2023. Pt has complaints of Lack of motion and pain with ADLs, overall improving since surgery. Pt has notable deficits with shoulder A/PROM, L shoulder strength, hypersensitivity of surgical scars, and pain levels. Pt would benefit from therapeutic focus on gentle strengthening/mobility work progressing as tolerated until pt can maintain ROM and strength inline with therapeutic goals. Treatment performed today focused on pt education detailed in the objective. Pt demonstrated great  understanding of education provided. required minimal v/t cues and no assistance for appropriate performance with today's activities.  Pt requires the intervention of skilled outpatient physical therapy to address the aforementioned deficits and progress towards a functional level in line with therapeutic goals.    OBJECTIVE IMPAIRMENTS: decreased mobility, decreased ROM, decreased strength, decreased safety awareness, increased fascial restrictions, impaired UE functional use, postural dysfunction, and pain.   ACTIVITY LIMITATIONS: lifting, sleeping, bathing, toileting, dressing, and reach over head  PARTICIPATION LIMITATIONS: meal prep, cleaning, laundry, driving, shopping, community activity, and occupation  PERSONAL FACTORS: Age, Education, and Time since onset of injury/illness/exacerbation are also affecting patient's functional outcome.   REHAB POTENTIAL: Good  CLINICAL DECISION MAKING: Stable/uncomplicated  EVALUATION COMPLEXITY: Low  GOALS: Goals reviewed with patient? Yes  SHORT TERM GOALS: Target date: 12/05/2023  Pt will be independent with administered HEP to demonstrate the competency necessary for long term managemnet of symptoms at home.  Baseline: Goal status: INITIAL  LONG TERM GOALS: Target date: 12/26/2023  Pt. Will achieve a DASH score of 22 as to demonstrate improvement in self-perceived functional ability with daily activities.  Baseline: 32 Goal status: INITIAL  2.  Pt will report pain levels improving during ADLs to be less than or equal to 2/10 as to demonstrate improved tolerance with daily functional activities such as driving and dressing. Baseline: 4/10 Goal status: INITIAL  3.  Pt will improve MMT scores or global L shoulder to a 4+/5 to demonstrate  improvement in strength for quality of motion and activity performance.  Baseline: see obj chart Goal status: INITIAL  4.  Pt will achieve L shoulder flexion/abduction AROM of 160 degrees to  demonstrate mobility necessary for independent overhead mobility tasks. Baseline: see obj chart Goal status: INITIAL ---------------------------------------------------------------------------------------------  PLAN: PT FREQUENCY: 1-2x/week  PT DURATION: 6 weeks  PLANNED INTERVENTIONS: 97110-Therapeutic exercises, 97530- Therapeutic activity, 97112- Neuromuscular re-education, 97535- Self Care, [id number]- Manual therapy, Patient/Family education, Taping, Joint mobilization, Spinal mobilization, Scar mobilization, and DME instructions  PLAN FOR NEXT SESSION: AROM, limit compensation, update HEP as needed. STW for pain relief as needed  Mabel Kiang, PT, DPT 12/07/2023, 11:19 AM

## 2023-12-11 ENCOUNTER — Ambulatory Visit: Admitting: Physical Therapy

## 2023-12-11 ENCOUNTER — Encounter: Payer: Self-pay | Admitting: Physical Therapy

## 2023-12-11 DIAGNOSIS — M25512 Pain in left shoulder: Secondary | ICD-10-CM | POA: Diagnosis not present

## 2023-12-11 DIAGNOSIS — M6281 Muscle weakness (generalized): Secondary | ICD-10-CM

## 2023-12-11 DIAGNOSIS — M25612 Stiffness of left shoulder, not elsewhere classified: Secondary | ICD-10-CM

## 2023-12-11 DIAGNOSIS — G8929 Other chronic pain: Secondary | ICD-10-CM | POA: Diagnosis not present

## 2023-12-11 DIAGNOSIS — M25511 Pain in right shoulder: Secondary | ICD-10-CM | POA: Diagnosis not present

## 2023-12-11 NOTE — Therapy (Signed)
 OUTPATIENT PHYSICAL THERAPY TREATMENT NOTE   Patient Name: Michael Fleming MRN: [id number] DOB:Mar 14, 1971, 53 y.o., male Today's Date: 12/11/2023  END OF SESSION:  PT End of Session - 12/11/23 1526     Visit Number 8    Number of Visits 13    Date for PT Re-Evaluation 12/26/23    Authorization Type [employer]    PT Start Time 1530    PT Stop Time 1608    PT Time Calculation (min) 38 min    Activity Tolerance Patient tolerated treatment well              Past Medical History:  Diagnosis Date   Allergy    Biceps tendon tear    right   Heart palpitations 12/2018   Hyperlipidemia    Hypertension    Hypokalemia 12/2018   Seizures (HCC)    last seizure age 6 due to blood clot- 1981 none since    Past Surgical History:  Procedure Laterality Date   BRAIN SURGERY     1981 blot clot removed   FOOT SURGERY     right bunion removed   KNEE ARTHROSCOPY Left    SHOULDER ARTHROSCOPY WITH SUBACROMIAL DECOMPRESSION AND BICEP TENDON REPAIR Right 03/31/2021   Procedure: RIGHT SHOULDER ARTHROSCOPY, EXTENSIVE DEBRIDEMENT, SUBACROMIAL DECOMPRESSION, OPEN BICEPS TENODESIS;  Surgeon: Jerri Kay HERO, MD;  Location: [facility];  Service: Orthopedics;  Laterality: Right;   WRIST SURGERY Left    Patient Active Problem List   Diagnosis Date Noted   Impingement syndrome of left shoulder 09/12/2023   Partial nontraumatic tear of left rotator cuff 09/12/2023   Osteoarthritis of shoulder (Left) 06/22/2023   Arthritis of left acromioclavicular joint 06/22/2023   Cervicalgia 05/23/2023   Abnormal CT scan, cervical spine (12/07/2022) 05/08/2023   Chronic upper extremity pain (Left) 05/08/2023   Cervical radiculopathy (Left) 05/08/2023   Arthralgia of acromioclavicular joint (Right) 01/03/2023   Osteoarthritis of AC (acromioclavicular) joint (Left) 01/03/2023   Arthralgia of lacromioclavicular joint (Left) 01/03/2023   Chronic shoulder pain (Left) 12/15/2022   Chronic low back pain  (Bilateral) w/o sciatica 12/15/2022   Lipid screening 10/07/2022   Cervicogenic headache 09/28/2022   DDD (degenerative disc disease), cervical 09/28/2022   Cervical foraminal stenosis (Bilateral) 09/28/2022   Radicular shoulder pain 09/28/2022   Cervical facet hypertrophy 09/28/2022   Cervical radiculopathy at C6 09/28/2022   Cervical disc disorder at C4-C5 level with radiculopathy 09/28/2022   Chronic shoulder pain (1ry area of Pain) (Right) 04/11/2022    Class: Chronic   Partial tear of subscapularis tendon, sequela (Right) 04/11/2022   Traumatic rupture of biceps tendon, sequela (Right) 04/11/2022   Need for immunization against influenza 04/08/2022   Chronic pain syndrome 02/16/2022   Pharmacologic therapy 02/16/2022   Disorder of skeletal system 02/16/2022   Problems influencing health status 02/16/2022   Osteoarthritis of AC (acromioclavicular) joint (Right) 11/12/2021   Tendinopathy of biceps tendon (Right)    Degenerative superior 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) tear of shoulder (Right)    Tendinosis of rotator cuff (Right)    Nontraumatic tear of supraspinatus tendon (Right) 03/04/2021   Hyperlipidemia 02/05/2019   Hypokalemia 02/05/2019   Vitamin D  insufficiency 02/05/2019   Tick bite of groin 11/27/2018   Blurry vision 08/06/2018   Pain in wrist (Right) 03/27/2017   Chronic knee pain (Left) 03/27/2017   HTN (hypertension) 10/23/2016   History of brain surgery 10/23/2016    PCP: Oley Bascom RAMAN, NP  REFERRING PROVIDER: Oley Bascom RAMAN, NP  REFERRING DIAG: S/p left shoulder arthroscopic debridement/decompression  Rationale for Evaluation and Treatment: Rehabilitation  THERAPY DIAG:  Stiffness of left shoulder, not elsewhere classified  Acute pain of left shoulder  Muscle weakness (generalized)  PERTINENT HISTORY: HTN,(hx of brain surgery?) blurry vision,   WEIGHT BEARING RESTRICTIONS: Yes WBAT  FALLS:  Has patient fallen in last 6 months?  No  LIVING ENVIRONMENT: Lives with: lives with their family Lives in: House/apartment Stairs: no issues Has following equipment at home: None  OCCUPATION: Not currently working   PRECAUTIONS: None ---------------------------------------------------------------------------------------------  SUBJECTIVE:                                                                                                                                                                                      SUBJECTIVE STATEMENT:  12/11/2023: still having a lot of pain with sleeping. Pain with scooting up in bed and positioning on his side. Does express that is improving with time. States he has been feeling fine after PT session. Having pain L side of neck and anterior shoulder, about 6/10 at present.    Eval statement 11/14/2023: had a L shoulder decompression on 11/02/2023, feels that he is able to start move and use the L shoulder a little bit more since the surgery. Overall getting better,doesn't have too much pain, primarily just pressure when going to move and at rest. No n/t symptoms, currently 4/10 Hand dominance: Right  RED FLAGS: None   PLOF: Independent  PATIENT GOALS: rehab the shoulder  NEXT MD VISIT: 5 week follow up ---------------------------------------------------------------------------------------------  OBJECTIVE:  Note: Objective measures were completed at Evaluation unless otherwise noted.  DIAGNOSTIC FINDINGS:  IMPRESSION: 1. Moderate supraspinatus tendinosis with partial-thickness longitudinal tear along an approximate 1.9 cm length of the tendon, measuring only 1-2 mm in transverse thickness but along an approximate 13 mm AP dimension of the tendon. No significant tendon retraction. 2. Mild-to-moderate anterior infraspinatus tendinosis. 3. Mild partial-thickness tears of the mid to anterior aspect of the midsubstance of the superior 9 mm of the subscapularis  tendon insertion. 4. Mild to moderate anterior supraspinatus muscle atrophy. 5. Moderate proximal long head of the biceps tendinosis. 6. Moderate to high-grade degenerative changes of the acromioclavicular joint. This includes high-grade subchondral marrow edema and cystic change throughout the clavicular head, suggesting this may represent a source of pain. 7. Mild subacromial/subdeltoid bursitis. 8. Mild-to-moderate glenohumeral cartilage thinning. Curvilinear increased T2 signal within the posterosuperior glenoid labrum suspicious for a degenerative tear.     Electronically Signed   By: Tanda Lyons M.D.   On: 09/05/2023 16:57    PATIENT SURVEYS :  Quick dash: 32 ( 47.73%)  COGNITION: Overall  cognitive status: Within functional limits for tasks assessed     SENSATION: WFL  POSTURE: Thoracic kyphosis  UPPER EXTREMITY ROM:   Passive ROM Right eval Left eval 11/22/2023  Shoulder flexion WFL 100d! AROM 160  Shoulder extension [facility]  Shoulder abduction WFL 60d! AROM 160  Shoulder adduction     Shoulder internal rotation [facility]  Shoulder external rotation [facility] 40d! AROM 50  Elbow flexion     Elbow extension     Wrist flexion     Wrist extension     Wrist ulnar deviation     Wrist radial deviation     Wrist pronation     Wrist supination     (Blank rows = not tested)  UPPER EXTREMITY [id number] d/t recent surgery and pain levels  MMT Right eval Left eval  Shoulder flexion    Shoulder extension    Shoulder abduction    Shoulder adduction    Shoulder internal rotation    Shoulder external rotation    Middle trapezius    Lower trapezius    Elbow flexion    Elbow extension    Wrist flexion    Wrist extension    Wrist ulnar deviation    Wrist radial deviation    Wrist pronation    Wrist supination    Grip strength (lbs) 120 105  (Blank rows = not tested)  SHOULDER SPECIAL TESTS: Not indicated  JOINT MOBILITY TESTING:  Not  indicated  PALPATION:  Hypersensitivity over most proximal surgical scar   [facility] Adult PT Treatment:                                                DATE: 12/11/23 Therapeutic Exercise: Pulley flexion, 2 min abduction cues for comfortable ROM  L UT stretch UE anchored 3x30sec Sidelying horizontal abduction AROM x15  Sidelying arm circles (limited ROM) at 90 deg elevation 2x10 BIL  Green band row x10, blue band x10 w/ iso hold cues for appropriate shoulder/elbow mechanics  Therapeutic Activity: Foam roller flexion at wall x15 cues for reduced IR Foam roller abduction at wall x15 cues for setup  Sidelying abduction AROM x15  Education re: activity modification/positioning for improved activity tolerance    [facility] Adult PT Treatment:                                                DATE: 12/07/2023  Therapeutic Exercise: PROM into ER/IR/ABD/ Release of L lats, subscapularis and teres group Doorway pec stretch 2x1' Therapeutic Activity: NuStep 6' for activity tolerance  Foam roller flex/abd 2x15 ea. POC discussion and pt education regarding exercise dosage, at home pacing, and appropriate physiological exercise response     [facility] Adult PT Treatment:                                                DATE: 12/05/2023  Manual Therapy: Release of L lats, subscapularis and teres group  Therapeutic Activity: Lat pull down mobility 2x30, 35lbs Lat pull down  1x12, 70lb IR/ER switches 1x20 ea. IR isometric w/ lateral  steps 1x12 steps HEP update, review    [facility] Adult PT Treatment:                                                DATE: 11/27/2023  Therapeutic Exercise: NuStep 8' for activity tolerance Doorway pec stretch  2x1' Passive ABD w/ release of prec minor and CFM of AC capsule  Therapeutic Activity: Abduction at wall w/ foam roller 2x12, hold 4s Banded Chest press at wall w/foam roller 2x15, hold 2s, BTB Omega lat pull down 2x15, hold 2s  PATIENT  EDUCATION: Education details: rationale for interventions, HEP  Person educated: Patient Education method: Explanation, Demonstration, Tactile cues, Verbal cues Education comprehension: verbalized understanding, returned demonstration, verbal cues required, tactile cues required, and needs further education     HOME EXERCISE PROGRAM: Access Code: [email or web address] URL: [email or web address] Date: 12/05/2023 Prepared by: Mabel Kiang  Exercises - Standing Shoulder Internal Rotation Stretch with Towel  - 1 x daily - 7 x weekly - 2 sets - 1 reps - 21m hold - Shoulder Internal Rotation in Abduction with Resistance  - 1 x daily - 5 x weekly - 2-3 sets - 12 reps - 2s hold - Scapular Clock in [place] Position With Resistance Band  - 1 x daily - 5 x weekly - 2-3 sets - 4 reps - 2s hold ---------------------------------------------------------------------------------------------  ASSESSMENT:  CLINICAL IMPRESSION:   12/11/2023: Pt arrives w/ baseline pain although he reports gradual improvement. Today continuing to work on [facility] endurance, reaching tolerance, and mobility. He tolerates this relatively well although does require rest breaks d/t fatigue and transient increases in symptoms. Most difficulty after sidelying shoulder circles (tolerates well during but has brief spasm in deltoid afterwards). No adverse events, denies any significant change in symptoms at end of session. Recommend continuing along current POC in order to address relevant deficits and improve functional tolerance. Pt departs today's session in no acute distress, all voiced questions/concerns addressed appropriately from PT perspective.     Eval impression (11/14/2023): Pt. attended today's physical therapy session for evaluation of L shoulder s/p debridement and decompression on 11/02/2023. Pt has complaints of Lack of motion and pain with ADLs, overall improving since surgery. Pt has notable deficits with shoulder A/PROM, L  shoulder strength, hypersensitivity of surgical scars, and pain levels. Pt would benefit from therapeutic focus on gentle strengthening/mobility work progressing as tolerated until pt can maintain ROM and strength inline with therapeutic goals. Treatment performed today focused on pt education detailed in the objective. Pt demonstrated great understanding of education provided. required minimal v/t cues and no assistance for appropriate performance with today's activities.  Pt requires the intervention of skilled outpatient physical therapy to address the aforementioned deficits and progress towards a functional level in line with therapeutic goals.    OBJECTIVE IMPAIRMENTS: decreased mobility, decreased ROM, decreased strength, decreased safety awareness, increased fascial restrictions, impaired UE functional use, postural dysfunction, and pain.   ACTIVITY LIMITATIONS: lifting, sleeping, bathing, toileting, dressing, and reach over head  PARTICIPATION LIMITATIONS: meal prep, cleaning, laundry, driving, shopping, community activity, and occupation  PERSONAL FACTORS: Age, Education, and Time since onset of injury/illness/exacerbation are also affecting patient's functional outcome.   REHAB POTENTIAL: Good  CLINICAL DECISION MAKING: Stable/uncomplicated  EVALUATION COMPLEXITY: Low  GOALS: Goals reviewed with patient? Yes  SHORT TERM GOALS: Target date: 12/05/2023  Pt will be independent  with administered HEP to demonstrate the competency necessary for long term managemnet of symptoms at home.  Baseline: Goal status: INITIAL  LONG TERM GOALS: Target date: 12/26/2023  Pt. Will achieve a DASH score of 22 as to demonstrate improvement in self-perceived functional ability with daily activities.  Baseline: 32 Goal status: INITIAL  2.  Pt will report pain levels improving during ADLs to be less than or equal to 2/10 as to demonstrate improved tolerance with daily functional activities such as  driving and dressing. Baseline: 4/10 Goal status: INITIAL  3.  Pt will improve MMT scores or global L shoulder to a 4+/5 to demonstrate improvement in strength for quality of motion and activity performance.  Baseline: see obj chart Goal status: INITIAL  4.  Pt will achieve L shoulder flexion/abduction AROM of 160 degrees to demonstrate mobility necessary for independent overhead mobility tasks. Baseline: see obj chart Goal status: INITIAL ---------------------------------------------------------------------------------------------  PLAN: PT FREQUENCY: 1-2x/week  PT DURATION: 6 weeks  PLANNED INTERVENTIONS: 97110-Therapeutic exercises, 97530- Therapeutic activity, 97112- Neuromuscular re-education, 97535- Self Care, [id number]- Manual therapy, Patient/Family education, Taping, Joint mobilization, Spinal mobilization, Scar mobilization, and DME instructions  PLAN FOR NEXT SESSION: AROM, limit compensation, update HEP as needed. STW for pain relief as needed  Alm [name] Jenny PT, DPT 12/11/2023 4:14 PM

## 2023-12-14 ENCOUNTER — Ambulatory Visit: Admitting: Physical Therapy

## 2023-12-14 ENCOUNTER — Encounter: Payer: Self-pay | Admitting: Physical Therapy

## 2023-12-14 DIAGNOSIS — M25612 Stiffness of left shoulder, not elsewhere classified: Secondary | ICD-10-CM

## 2023-12-14 DIAGNOSIS — M25511 Pain in right shoulder: Secondary | ICD-10-CM | POA: Diagnosis not present

## 2023-12-14 DIAGNOSIS — G8929 Other chronic pain: Secondary | ICD-10-CM | POA: Diagnosis not present

## 2023-12-14 DIAGNOSIS — M25512 Pain in left shoulder: Secondary | ICD-10-CM

## 2023-12-14 DIAGNOSIS — M6281 Muscle weakness (generalized): Secondary | ICD-10-CM | POA: Diagnosis not present

## 2023-12-14 NOTE — Therapy (Addendum)
 OUTPATIENT PHYSICAL THERAPY TREATMENT NOTE   Patient Name: Michael Fleming MRN: [id number] DOB:[birth year], 53 y.o., male Today's Date: 12/14/2023  END OF SESSION:  PT End of Session - 12/14/23 0955     Visit Number 9    Number of Visits 13    Date for PT Re-Evaluation 12/26/23    Authorization Type Aetna    Authorization Time Period AETNA 2025/ [employer]-secondary patient fully covered as long as MCD plan is active.    PT Start Time 1000    PT Stop Time 1038    PT Time Calculation (min) 38 min    Activity Tolerance Patient tolerated treatment well    Behavior During Therapy WFL for tasks assessed/performed               Past Medical History:  Diagnosis Date   Allergy    Biceps tendon tear    right   Heart palpitations 12/2018   Hyperlipidemia    Hypertension    Hypokalemia 12/2018   Seizures (HCC)    last seizure age 67 due to blood clot- 1981 none since    Past Surgical History:  Procedure Laterality Date   BRAIN SURGERY     1981 blot clot removed   FOOT SURGERY     right bunion removed   KNEE ARTHROSCOPY Left    SHOULDER ARTHROSCOPY WITH SUBACROMIAL DECOMPRESSION AND BICEP TENDON REPAIR Right 03/31/2021   Procedure: RIGHT SHOULDER ARTHROSCOPY, EXTENSIVE DEBRIDEMENT, SUBACROMIAL DECOMPRESSION, OPEN BICEPS TENODESIS;  Surgeon: Jerri Kay HERO, MD;  Location: [facility];  Service: Orthopedics;  Laterality: Right;   WRIST SURGERY Left    Patient Active Problem List   Diagnosis Date Noted   Impingement syndrome of left shoulder 09/12/2023   Partial nontraumatic tear of left rotator cuff 09/12/2023   Osteoarthritis of shoulder (Left) 06/22/2023   Arthritis of left acromioclavicular joint 06/22/2023   Cervicalgia 05/23/2023   Abnormal CT scan, cervical spine (12/07/2022) 05/08/2023   Chronic upper extremity pain (Left) 05/08/2023   Cervical radiculopathy (Left) 05/08/2023   Arthralgia of acromioclavicular joint (Right) 01/03/2023    Osteoarthritis of AC (acromioclavicular) joint (Left) 01/03/2023   Arthralgia of lacromioclavicular joint (Left) 01/03/2023   Chronic shoulder pain (Left) 12/15/2022   Chronic low back pain (Bilateral) w/o sciatica 12/15/2022   Lipid screening 10/07/2022   Cervicogenic headache 09/28/2022   DDD (degenerative disc disease), cervical 09/28/2022   Cervical foraminal stenosis (Bilateral) 09/28/2022   Radicular shoulder pain 09/28/2022   Cervical facet hypertrophy 09/28/2022   Cervical radiculopathy at C6 09/28/2022   Cervical disc disorder at C4-C5 level with radiculopathy 09/28/2022   Chronic shoulder pain (1ry area of Pain) (Right) 04/11/2022    Class: Chronic   Partial tear of subscapularis tendon, sequela (Right) 04/11/2022   Traumatic rupture of biceps tendon, sequela (Right) 04/11/2022   Need for immunization against influenza 04/08/2022   Chronic pain syndrome 02/16/2022   Pharmacologic therapy 02/16/2022   Disorder of skeletal system 02/16/2022   Problems influencing health status 02/16/2022   Osteoarthritis of AC (acromioclavicular) joint (Right) 11/12/2021   Tendinopathy of biceps tendon (Right)    Degenerative superior 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) tear of shoulder (Right)    Tendinosis of rotator cuff (Right)    Nontraumatic tear of supraspinatus tendon (Right) 03/04/2021   Hyperlipidemia 02/05/2019   Hypokalemia 02/05/2019   Vitamin D  insufficiency 02/05/2019   Tick bite of groin 11/27/2018   Blurry vision 08/06/2018   Pain in wrist (Right) 03/27/2017  Chronic knee pain (Left) 03/27/2017   HTN (hypertension) 10/23/2016   History of brain surgery 10/23/2016    PCP: Oley Bascom RAMAN, NP  REFERRING PROVIDER: Oley Bascom RAMAN, NP  REFERRING DIAG: S/p left shoulder arthroscopic debridement/decompression  Rationale for Evaluation and Treatment: Rehabilitation  THERAPY DIAG:  Stiffness of left shoulder, not elsewhere classified  Muscle weakness  (generalized)  Acute pain of left shoulder  PERTINENT HISTORY: HTN,(hx of brain surgery?) blurry vision,   WEIGHT BEARING RESTRICTIONS: Yes WBAT  FALLS:  Has patient fallen in last 6 months? No  LIVING ENVIRONMENT: Lives with: lives with their family Lives in: House/apartment Stairs: no issues   Has following equipment at home: None  OCCUPATION: Not currently working   PRECAUTIONS: None ---------------------------------------------------------------------------------------------  SUBJECTIVE:                                                                                                                                                                                      SUBJECTIVE STATEMENT:  Pt attended today's session with reports of 6 /10 pain. Pt stated that they have maintained great compliance with current HEP.  Still continuing to have pain, exacerbated at night after a day of driving or sleeping on it.    Eval statement 11/14/2023: had a L shoulder decompression on 11/02/2023, feels that he is able to start move and use the L shoulder a little bit more since the surgery. Overall getting better,doesn't have too much pain, primarily just pressure when going to move and at rest. No n/t symptoms, currently 4/10 Hand dominance: Right  RED FLAGS: None   PLOF: Independent  PATIENT GOALS: rehab the shoulder  NEXT MD VISIT: 5 week follow up ---------------------------------------------------------------------------------------------  OBJECTIVE:  Note: Objective measures were completed at Evaluation unless otherwise noted.  DIAGNOSTIC FINDINGS:  IMPRESSION: 1. Moderate supraspinatus tendinosis with partial-thickness longitudinal tear along an approximate 1.9 cm length of the tendon, measuring only 1-2 mm in transverse thickness but along an approximate 13 mm AP dimension of the tendon. No significant tendon retraction. 2. Mild-to-moderate anterior infraspinatus  tendinosis. 3. Mild partial-thickness tears of the mid to anterior aspect of the midsubstance of the superior 9 mm of the subscapularis tendon insertion. 4. Mild to moderate anterior supraspinatus muscle atrophy. 5. Moderate proximal long head of the biceps tendinosis. 6. Moderate to high-grade degenerative changes of the acromioclavicular joint. This includes high-grade subchondral marrow edema and cystic change throughout the clavicular head, suggesting this may represent a source of pain. 7. Mild subacromial/subdeltoid bursitis. 8. Mild-to-moderate glenohumeral cartilage thinning. Curvilinear increased T2 signal within the posterosuperior glenoid labrum suspicious for a degenerative tear.     Electronically Signed  By: Tanda Lyons M.D.   On: 09/05/2023 16:57    PATIENT SURVEYS :  Quick dash: 32 ( 47.73%)  COGNITION: Overall cognitive status: Within functional limits for tasks assessed     SENSATION: WFL  POSTURE: Thoracic kyphosis  UPPER EXTREMITY ROM:   Passive ROM Right eval Left eval 11/22/2023  Shoulder flexion WFL 100d! AROM 160  Shoulder extension [facility]  Shoulder abduction WFL 60d! AROM 160  Shoulder adduction     Shoulder internal rotation [facility]  Shoulder external rotation [facility] 40d! AROM 50  Elbow flexion     Elbow extension     Wrist flexion     Wrist extension     Wrist ulnar deviation     Wrist radial deviation     Wrist pronation     Wrist supination     (Blank rows = not tested)  UPPER EXTREMITY FFU:izqzmmzi d/t recent surgery and pain levels  MMT Right eval Left eval  Shoulder flexion    Shoulder extension    Shoulder abduction    Shoulder adduction    Shoulder internal rotation    Shoulder external rotation    Middle trapezius    Lower trapezius    Elbow flexion    Elbow extension    Wrist flexion    Wrist extension    Wrist ulnar deviation    Wrist radial deviation    Wrist pronation    Wrist supination    Grip  strength (lbs) 120 105  (Blank rows = not tested)  SHOULDER SPECIAL TESTS: Not indicated  JOINT MOBILITY TESTING:  Not indicated  PALPATION:  Hypersensitivity over most proximal surgical scar   [facility] Adult PT Treatment:                                                DATE: 12/14/2023 Therapeutic Activity: UBE 3/3 POC discussion regarding pain management and treatment focus Cable IR isometric throughout flexion ROM 2x12, hold 3s at top of motion, 10lb Cable IR w/ ecc focus at 90d abduction 2x15, 3s ecc, 7lb Omega row 2x12, hold 3s, 65lb Omega lat pul down 2x12, hold 3s, 65lb   [facility] Adult PT Treatment:                                                DATE: 12/11/23 Therapeutic Exercise: Pulley flexion, 2 min abduction cues for comfortable ROM  L UT stretch UE anchored 3x30sec Sidelying horizontal abduction AROM x15  Sidelying arm circles (limited ROM) at 90 deg elevation 2x10 BIL  Green band row x10, blue band x10 w/ iso hold cues for appropriate shoulder/elbow mechanics  Therapeutic Activity: Foam roller flexion at wall x15 cues for reduced IR Foam roller abduction at wall x15 cues for setup  Sidelying abduction AROM x15  Education re: activity modification/positioning for improved activity tolerance    [facility] Adult PT Treatment:                                                DATE: 12/07/2023  Therapeutic Exercise: PROM into ER/IR/ABD/  Release of L lats, subscapularis and teres group Doorway pec stretch 2x1' Therapeutic Activity: NuStep 6' for activity tolerance  Foam roller flex/abd 2x15 ea. POC discussion and pt education regarding exercise dosage, at home pacing, and appropriate physiological exercise response    PATIENT EDUCATION: Education details: rationale for interventions, HEP  Person educated: Patient Education method: Explanation, Demonstration, Tactile cues, Verbal cues Education comprehension: verbalized understanding, returned demonstration,  verbal cues required, tactile cues required, and needs further education     HOME EXERCISE PROGRAM: Access Code: [email or web address] URL: [email or web address] Date: 12/05/2023 Prepared by: Mabel Kiang  Exercises - Standing Shoulder Internal Rotation Stretch with Towel  - 1 x daily - 7 x weekly - 2 sets - 1 reps - 28m hold - Shoulder Internal Rotation in Abduction with Resistance  - 1 x daily - 5 x weekly - 2-3 sets - 12 reps - 2s hold - Scapular Clock in [place] Position With Resistance Band  - 1 x daily - 5 x weekly - 2-3 sets - 4 reps - 2s hold ---------------------------------------------------------------------------------------------  ASSESSMENT:  CLINICAL IMPRESSION:   Pt attended physical therapy session for continuation of treatment regarding L shoulder debridement and decompression. Today's treatment focused on L subscapularis loading, shoulder girdle force coupling, and dynamic shoulder stability. Pt is continuing to report and demonstrate higher levels of pain/apprehension even with significant improvements in strength/motion in L shoulder. Pt showed good tolerance to administered treatment with no adverse effects by the end of session. Skilled intervention was utilized via activity modification for pt tolerance with task completion, functional progression/regression promoting best outcomes inline with current rehab goals, as well as moderate verbal/tactile cuing alongside no physical assistance for safe and appropriate performance of today's activities. Re-eval next visit, if no change in pain is noted alongside normal motion and strength, consider pausing therapy or d/cing with lng term HEP with a recommended f/u with surgeon/PCP for pain management options.   Eval impression (11/14/2023): Pt. attended today's physical therapy session for evaluation of L shoulder s/p debridement and decompression on 11/02/2023. Pt has complaints of Lack of motion and pain with ADLs, overall  improving since surgery. Pt has notable deficits with shoulder A/PROM, L shoulder strength, hypersensitivity of surgical scars, and pain levels. Pt would benefit from therapeutic focus on gentle strengthening/mobility work progressing as tolerated until pt can maintain ROM and strength inline with therapeutic goals. Treatment performed today focused on pt education detailed in the objective. Pt demonstrated great understanding of education provided. required minimal v/t cues and no assistance for appropriate performance with today's activities.  Pt requires the intervention of skilled outpatient physical therapy to address the aforementioned deficits and progress towards a functional level in line with therapeutic goals.    OBJECTIVE IMPAIRMENTS: decreased mobility, decreased ROM, decreased strength, decreased safety awareness, increased fascial restrictions, impaired UE functional use, postural dysfunction, and pain.   ACTIVITY LIMITATIONS: lifting, sleeping, bathing, toileting, dressing, and reach over head  PARTICIPATION LIMITATIONS: meal prep, cleaning, laundry, driving, shopping, community activity, and occupation  PERSONAL FACTORS: Age, Education, and Time since onset of injury/illness/exacerbation are also affecting patient's functional outcome.   REHAB POTENTIAL: Good  CLINICAL DECISION MAKING: Stable/uncomplicated  EVALUATION COMPLEXITY: Low  GOALS: Goals reviewed with patient? Yes  SHORT TERM GOALS: Target date: 12/05/2023  Pt will be independent with administered HEP to demonstrate the competency necessary for long term managemnet of symptoms at home.  Baseline: Goal status: INITIAL  LONG TERM GOALS: Target date: 12/26/2023  Pt. Will achieve a DASH  score of 22 as to demonstrate improvement in self-perceived functional ability with daily activities.  Baseline: 32 Goal status: INITIAL  2.  Pt will report pain levels improving during ADLs to be less than or equal to 2/10 as to  demonstrate improved tolerance with daily functional activities such as driving and dressing. Baseline: 4/10 Goal status: INITIAL  3.  Pt will improve MMT scores or global L shoulder to a 4+/5 to demonstrate improvement in strength for quality of motion and activity performance.  Baseline: see obj chart Goal status: INITIAL  4.  Pt will achieve L shoulder flexion/abduction AROM of 160 degrees to demonstrate mobility necessary for independent overhead mobility tasks. Baseline: see obj chart Goal status: INITIAL ---------------------------------------------------------------------------------------------  PLAN: PT FREQUENCY: 1-2x/week  PT DURATION: 6 weeks  PLANNED INTERVENTIONS: 97110-Therapeutic exercises, 97530- Therapeutic activity, 97112- Neuromuscular re-education, 97535- Self Care, 02859- Manual therapy, Patient/Family education, Taping, Joint mobilization, Spinal mobilization, Scar mobilization, and DME instructions  PLAN FOR NEXT SESSION:re-eval  Mabel Kiang, PT, DPT 12/14/2023, 10:38 AM  For all possible CPT codes, reference the Planned Interventions line above.     Check all conditions that are expected to impact treatment: {Conditions expected to impact treatment:None of these apply   If treatment provided at initial evaluation, no treatment charged due to lack of authorization.

## 2023-12-15 ENCOUNTER — Ambulatory Visit: Admitting: Physician Assistant

## 2023-12-15 DIAGNOSIS — Z9889 Other specified postprocedural states: Secondary | ICD-10-CM

## 2023-12-15 MED ORDER — OXYCODONE-ACETAMINOPHEN 5-325 MG PO TABS: 1.0000 | ORAL_TABLET | Freq: Two times a day (BID) | ORAL | 0 refills | Status: DC | PRN

## 2023-12-15 NOTE — Progress Notes (Signed)
 Post-Op Visit Note   Patient: Michael Fleming           Date of Birth: July 04, 1970           MRN: [id number] Visit Date: 12/15/2023 PCP: Oley Bascom RAMAN, NP   Assessment & Plan:  Chief Complaint:  Chief Complaint  Patient presents with   Left Shoulder - Follow-up, Routine Post Op    11/02/23 LT SHOULDER SCOPE   Visit Diagnoses:  1. History of arthroscopy of left shoulder     Plan: Patient is a pleasant 53 year old gentleman who comes in today approximately 6 weeks status post left shoulder arthroscopic debridement decompression 11/02/2023.  He has been doing better.  He is still in a fair amount of pain but also has what sounds like left upper extremity radiculopathy.  He has been taking Tylenol  arthritis without any relief.  He has been in physical therapy making good progress.  Examination of the left shoulder reveals forward flexion and abduction to 160 degrees.  Internal rotation to L5.  External rotation to 50 degrees.  He is neurovascularly intact distally.  At this point, he will continue to push things in PT.  I sent in a prescription of Percocet as he tells me the Norco does not help.  He understands this will not be refilled.  He will follow-up with us  in 6 weeks for recheck.  Call with concerns or questions.  Follow-Up Instructions: Return in about 6 weeks (around 01/26/2024).   Orders:  No orders of the defined types were placed in this encounter.  Meds ordered this encounter  Medications   oxyCODONE -acetaminophen  (PERCOCET) 5-325 MG tablet    Sig: Take 1 tablet by mouth 2 (two) times daily as needed.    Dispense:  20 tablet    Refill:  0    Imaging: No new imaging  PMFS History: Patient Active Problem List   Diagnosis Date Noted   Impingement syndrome of left shoulder 09/12/2023   Partial nontraumatic tear of left rotator cuff 09/12/2023   Osteoarthritis of shoulder (Left) 06/22/2023   Arthritis of left acromioclavicular joint 06/22/2023   Cervicalgia  05/23/2023   Abnormal CT scan, cervical spine (12/07/2022) 05/08/2023   Chronic upper extremity pain (Left) 05/08/2023   Cervical radiculopathy (Left) 05/08/2023   Arthralgia of acromioclavicular joint (Right) 01/03/2023   Osteoarthritis of AC (acromioclavicular) joint (Left) 01/03/2023   Arthralgia of lacromioclavicular joint (Left) 01/03/2023   Chronic shoulder pain (Left) 12/15/2022   Chronic low back pain (Bilateral) w/o sciatica 12/15/2022   Lipid screening 10/07/2022   Cervicogenic headache 09/28/2022   DDD (degenerative disc disease), cervical 09/28/2022   Cervical foraminal stenosis (Bilateral) 09/28/2022   Radicular shoulder pain 09/28/2022   Cervical facet hypertrophy 09/28/2022   Cervical radiculopathy at C6 09/28/2022   Cervical disc disorder at C4-C5 level with radiculopathy 09/28/2022   Chronic shoulder pain (1ry area of Pain) (Right) 04/11/2022    Class: Chronic   Partial tear of subscapularis tendon, sequela (Right) 04/11/2022   Traumatic rupture of biceps tendon, sequela (Right) 04/11/2022   Need for immunization against influenza 04/08/2022   Chronic pain syndrome 02/16/2022   Pharmacologic therapy 02/16/2022   Disorder of skeletal system 02/16/2022   Problems influencing health status 02/16/2022   Osteoarthritis of AC (acromioclavicular) joint (Right) 11/12/2021   Tendinopathy of biceps tendon (Right)    Degenerative superior 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) tear of shoulder (Right)    Tendinosis of rotator cuff (Right)    Nontraumatic tear of supraspinatus  tendon (Right) 03/04/2021   Hyperlipidemia 02/05/2019   Hypokalemia 02/05/2019   Vitamin D  insufficiency 02/05/2019   Tick bite of groin 11/27/2018   Blurry vision 08/06/2018   Pain in wrist (Right) 03/27/2017   Chronic knee pain (Left) 03/27/2017   HTN (hypertension) 10/23/2016   History of brain surgery 10/23/2016   Past Medical History:  Diagnosis Date   Allergy    Biceps tendon tear    right    Heart palpitations 12/2018   Hyperlipidemia    Hypertension    Hypokalemia 12/2018   Seizures (HCC)    last seizure age 15 due to blood clot- 1981 none since     Family History  Problem Relation Age of Onset   Healthy Mother    Hypertension Father    Colon cancer Neg Hx    Colon polyps Neg Hx    Esophageal cancer Neg Hx    Rectal cancer Neg Hx    Stomach cancer Neg Hx     Past Surgical History:  Procedure Laterality Date   BRAIN SURGERY     1981 blot clot removed   FOOT SURGERY     right bunion removed   KNEE ARTHROSCOPY Left    SHOULDER ARTHROSCOPY WITH SUBACROMIAL DECOMPRESSION AND BICEP TENDON REPAIR Right 03/31/2021   Procedure: RIGHT SHOULDER ARTHROSCOPY, EXTENSIVE DEBRIDEMENT, SUBACROMIAL DECOMPRESSION, OPEN BICEPS TENODESIS;  Surgeon: Jerri Kay HERO, MD;  Location: [facility];  Service: Orthopedics;  Laterality: Right;   WRIST SURGERY Left    Social History   Occupational History   Not on file  Tobacco Use   Smoking status: Never   Smokeless tobacco: Never  Vaping Use   Vaping status: Never Used  Substance and Sexual Activity   Alcohol use: Yes    Comment: occasional   Drug use: No   Sexual activity: Not Currently

## 2023-12-19 ENCOUNTER — Encounter: Payer: Self-pay | Admitting: Physical Therapy

## 2023-12-19 ENCOUNTER — Ambulatory Visit: Admitting: Physical Therapy

## 2023-12-19 DIAGNOSIS — G8929 Other chronic pain: Secondary | ICD-10-CM | POA: Diagnosis not present

## 2023-12-19 DIAGNOSIS — M25612 Stiffness of left shoulder, not elsewhere classified: Secondary | ICD-10-CM

## 2023-12-19 DIAGNOSIS — M6281 Muscle weakness (generalized): Secondary | ICD-10-CM | POA: Diagnosis not present

## 2023-12-19 DIAGNOSIS — M25512 Pain in left shoulder: Secondary | ICD-10-CM | POA: Diagnosis not present

## 2023-12-19 DIAGNOSIS — M25511 Pain in right shoulder: Secondary | ICD-10-CM | POA: Diagnosis not present

## 2023-12-19 NOTE — Therapy (Addendum)
 OUTPATIENT PHYSICAL THERAPY TREATMENT NOTE   Patient Name: Michael Fleming MRN: [id number] DOB:1970/08/03, 53 y.o., male Today's Date: 12/19/2023  END OF SESSION:  PT End of Session - 12/19/23 0922     Visit Number 10    Number of Visits 13    Date for PT Re-Evaluation 12/26/23    PT Start Time 0921    PT Stop Time 0959    PT Time Calculation (min) 38 min               Past Medical History:  Diagnosis Date   Allergy    Biceps tendon tear    right   Heart palpitations 12/2018   Hyperlipidemia    Hypertension    Hypokalemia 12/2018   Seizures (HCC)    last seizure age 50 due to blood clot- 1981 none since    Past Surgical History:  Procedure Laterality Date   BRAIN SURGERY     1981 blot clot removed   FOOT SURGERY     right bunion removed   KNEE ARTHROSCOPY Left    SHOULDER ARTHROSCOPY WITH SUBACROMIAL DECOMPRESSION AND BICEP TENDON REPAIR Right 03/31/2021   Procedure: RIGHT SHOULDER ARTHROSCOPY, EXTENSIVE DEBRIDEMENT, SUBACROMIAL DECOMPRESSION, OPEN BICEPS TENODESIS;  Surgeon: Jerri Kay HERO, MD;  Location: [facility];  Service: Orthopedics;  Laterality: Right;   WRIST SURGERY Left    Patient Active Problem List   Diagnosis Date Noted   Impingement syndrome of left shoulder 09/12/2023   Partial nontraumatic tear of left rotator cuff 09/12/2023   Osteoarthritis of shoulder (Left) 06/22/2023   Arthritis of left acromioclavicular joint 06/22/2023   Cervicalgia 05/23/2023   Abnormal CT scan, cervical spine (12/07/2022) 05/08/2023   Chronic upper extremity pain (Left) 05/08/2023   Cervical radiculopathy (Left) 05/08/2023   Arthralgia of acromioclavicular joint (Right) 01/03/2023   Osteoarthritis of AC (acromioclavicular) joint (Left) 01/03/2023   Arthralgia of lacromioclavicular joint (Left) 01/03/2023   Chronic shoulder pain (Left) 12/15/2022   Chronic low back pain (Bilateral) w/o sciatica 12/15/2022   Lipid screening 10/07/2022   Cervicogenic  headache 09/28/2022   DDD (degenerative disc disease), cervical 09/28/2022   Cervical foraminal stenosis (Bilateral) 09/28/2022   Radicular shoulder pain 09/28/2022   Cervical facet hypertrophy 09/28/2022   Cervical radiculopathy at C6 09/28/2022   Cervical disc disorder at C4-C5 level with radiculopathy 09/28/2022   Chronic shoulder pain (1ry area of Pain) (Right) 04/11/2022    Class: Chronic   Partial tear of subscapularis tendon, sequela (Right) 04/11/2022   Traumatic rupture of biceps tendon, sequela (Right) 04/11/2022   Need for immunization against influenza 04/08/2022   Chronic pain syndrome 02/16/2022   Pharmacologic therapy 02/16/2022   Disorder of skeletal system 02/16/2022   Problems influencing health status 02/16/2022   Osteoarthritis of AC (acromioclavicular) joint (Right) 11/12/2021   Tendinopathy of biceps tendon (Right)    Degenerative superior 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) tear of shoulder (Right)    Tendinosis of rotator cuff (Right)    Nontraumatic tear of supraspinatus tendon (Right) 03/04/2021   Hyperlipidemia 02/05/2019   Hypokalemia 02/05/2019   Vitamin D  insufficiency 02/05/2019   Tick bite of groin 11/27/2018   Blurry vision 08/06/2018   Pain in wrist (Right) 03/27/2017   Chronic knee pain (Left) 03/27/2017   HTN (hypertension) 10/23/2016   History of brain surgery 10/23/2016    PCP: Oley Bascom RAMAN, NP  REFERRING PROVIDER: Oley Bascom RAMAN, NP  REFERRING DIAG: S/p left shoulder arthroscopic debridement/decompression  Rationale for Evaluation and Treatment:  Rehabilitation  THERAPY DIAG:  Stiffness of left shoulder, not elsewhere classified  Muscle weakness (generalized)  Acute pain of left shoulder  PERTINENT HISTORY: HTN,(hx of brain surgery?) blurry vision,   WEIGHT BEARING RESTRICTIONS: Yes WBAT  FALLS:  Has patient fallen in last 6 months? No  LIVING ENVIRONMENT: Lives with: lives with their family Lives in:  House/apartment Stairs: no issues   Has following equipment at home: None  OCCUPATION: Not currently working   PRECAUTIONS: None ---------------------------------------------------------------------------------------------  SUBJECTIVE:                                                                                                                                                                                      SUBJECTIVE STATEMENT:   Pt stated that they have maintained great compliance with current HEP.  Still continuing to have pain and is not noticing any change, still finds positions such as sleeping on the arm or WB uncomfortable, also unable to tolerate prolonged activty.    Eval statement 11/14/2023: had a L shoulder decompression on 11/02/2023, feels that he is able to start move and use the L shoulder a little bit more since the surgery. Overall getting better,doesn't have too much pain, primarily just pressure when going to move and at rest. No n/t symptoms, currently 4/10 Hand dominance: Right  RED FLAGS: None   PLOF: Independent  PATIENT GOALS: rehab the shoulder  NEXT MD VISIT: 5 week follow up ---------------------------------------------------------------------------------------------  OBJECTIVE:  Note: Objective measures were completed at Evaluation unless otherwise noted.  DIAGNOSTIC FINDINGS:  IMPRESSION: 1. Moderate supraspinatus tendinosis with partial-thickness longitudinal tear along an approximate 1.9 cm length of the tendon, measuring only 1-2 mm in transverse thickness but along an approximate 13 mm AP dimension of the tendon. No significant tendon retraction. 2. Mild-to-moderate anterior infraspinatus tendinosis. 3. Mild partial-thickness tears of the mid to anterior aspect of the midsubstance of the superior 9 mm of the subscapularis tendon insertion. 4. Mild to moderate anterior supraspinatus muscle atrophy. 5. Moderate proximal long head of  the biceps tendinosis. 6. Moderate to high-grade degenerative changes of the acromioclavicular joint. This includes high-grade subchondral marrow edema and cystic change throughout the clavicular head, suggesting this may represent a source of pain. 7. Mild subacromial/subdeltoid bursitis. 8. Mild-to-moderate glenohumeral cartilage thinning. Curvilinear increased T2 signal within the posterosuperior glenoid labrum suspicious for a degenerative tear.     Electronically Signed   By: Tanda Lyons M.D.   On: 09/05/2023 16:57    PATIENT SURVEYS :  Quick dash: 32 ( 47.73%)  COGNITION: Overall cognitive status: Within functional limits for tasks assessed     SENSATION: WFL  POSTURE: Thoracic kyphosis  UPPER EXTREMITY ROM:   Passive ROM Right eval Left eval 11/22/2023  Shoulder flexion WFL 100d! AROM 160  Shoulder extension [facility]  Shoulder abduction WFL 60d! AROM 160  Shoulder adduction     Shoulder internal rotation [facility]  Shoulder external rotation [facility] 40d! AROM 50  Elbow flexion     Elbow extension     Wrist flexion     Wrist extension     Wrist ulnar deviation     Wrist radial deviation     Wrist pronation     Wrist supination     (Blank rows = not tested)  UPPER EXTREMITY FFU:[id number] d/t recent surgery and pain levels  MMT Right eval Left eval 12/19/2023  Shoulder flexion   5  Shoulder extension   5  Shoulder abduction   4-  Shoulder adduction     Shoulder internal rotation     Shoulder external rotation   4  Middle trapezius     Lower trapezius     Elbow flexion     Elbow extension     Wrist flexion     Wrist extension     Wrist ulnar deviation     Wrist radial deviation     Wrist pronation     Wrist supination     Grip strength (lbs) 120 105   (Blank rows = not tested)  SHOULDER SPECIAL TESTS: Not indicated  JOINT MOBILITY TESTING:  Not indicated  PALPATION:  Hypersensitivity over most proximal surgical scar   [facility] Adult PT  Treatment:                                                DATE: 12/19/2023 Therapeutic Activity: Objective measures Goal assessment, functional testing Self Care: Pt education POC discussion   [facility] Adult PT Treatment:                                                DATE: 12/14/2023 Therapeutic Activity: UBE 3/3 POC discussion regarding pain management and treatment focus Cable IR isometric throughout flexion ROM 2x12, hold 3s at top of motion, 10lb Cable IR w/ ecc focus at 90d abduction 2x15, 3s ecc, 7lb Omega row 2x12, hold 3s, 65lb Omega lat pul down 2x12, hold 3s, 65lb   [facility] Adult PT Treatment:                                                DATE: 12/11/23 Therapeutic Exercise: Pulley flexion, 2 min abduction cues for comfortable ROM  L UT stretch UE anchored 3x30sec Sidelying horizontal abduction AROM x15  Sidelying arm circles (limited ROM) at 90 deg elevation 2x10 BIL  Green band row x10, blue band x10 w/ iso hold cues for appropriate shoulder/elbow mechanics  Therapeutic Activity: Foam roller flexion at wall x15 cues for reduced IR Foam roller abduction at wall x15 cues for setup  Sidelying abduction AROM x15  Education re: activity modification/positioning for improved activity tolerance    [facility] Adult PT Treatment:  DATE: 12/07/2023  Therapeutic Exercise: PROM into ER/IR/ABD/ Release of L lats, subscapularis and teres group Doorway pec stretch 2x1' Therapeutic Activity: NuStep 6' for activity tolerance  Foam roller flex/abd 2x15 ea. POC discussion and pt education regarding exercise dosage, at home pacing, and appropriate physiological exercise response    PATIENT EDUCATION: Education details: rationale for interventions, HEP  Person educated: Patient Education method: Explanation, Demonstration, Tactile cues, Verbal cues Education comprehension: verbalized understanding, returned demonstration, verbal cues  required, tactile cues required, and needs further education     HOME EXERCISE PROGRAM: Access Code: [email or web address] URL: [email or web address] Date: 12/19/2023 Prepared by: Mabel Kiang  Exercises - Standing Shoulder Internal Rotation Stretch with Towel  - 1 x daily - 7 x weekly - 2 sets - 1 reps - 11m hold - Shoulder Internal Rotation in Abduction with Resistance  - 1 x daily - 5 x weekly - 2-3 sets - 12 reps - 2s hold - Scapular Clock in [place] Position With Resistance Band  - 1 x daily - 5 x weekly - 2-3 sets - 4 reps - 2s hold - Standing Single Arm Shoulder PNF D1 Flexion with Anchored Resistance  - 1 x daily - 4 x weekly - 2-3 sets - 12 reps - 2s hold ---------------------------------------------------------------------------------------------  ASSESSMENT:  CLINICAL IMPRESSION:   Pt attended physical therapy session 28m late for re-evaluation of L shoulder debridement and decompression. Pt has met  2 goals and continues to work towards 3 others. Difficulties continue with pain free functional strength and pain levels. Pt required minimal cuing as well as no assistance for safe and appropriate performance of today's activities. Because pt has made significant progress with ROM and strength in line with PLOF however continues to plateau with pain management, pt requested to be d/c until f/u with surgeon for futher potential treatment options. Education was given to continue applying ADL education from previous sessions as well as performing HEP as prescribed with freedom to progress as tolerated using previous education on modification and exercise dosage. Pt has displayed and verbalized competence regarding this education.   Eval impression (11/14/2023): Pt. attended today's physical therapy session for evaluation of L shoulder s/p debridement and decompression on 11/02/2023. Pt has complaints of Lack of motion and pain with ADLs, overall improving since surgery. Pt has notable deficits  with shoulder A/PROM, L shoulder strength, hypersensitivity of surgical scars, and pain levels. Pt would benefit from therapeutic focus on gentle strengthening/mobility work progressing as tolerated until pt can maintain ROM and strength inline with therapeutic goals. Treatment performed today focused on pt education detailed in the objective. Pt demonstrated great understanding of education provided. required minimal v/t cues and no assistance for appropriate performance with today's activities.  Pt requires the intervention of skilled outpatient physical therapy to address the aforementioned deficits and progress towards a functional level in line with therapeutic goals.    OBJECTIVE IMPAIRMENTS: decreased mobility, decreased ROM, decreased strength, decreased safety awareness, increased fascial restrictions, impaired UE functional use, postural dysfunction, and pain.   ACTIVITY LIMITATIONS: lifting, sleeping, bathing, toileting, dressing, and reach over head  PARTICIPATION LIMITATIONS: meal prep, cleaning, laundry, driving, shopping, community activity, and occupation  PERSONAL FACTORS: Age, Education, and Time since onset of injury/illness/exacerbation are also affecting patient's functional outcome.   REHAB POTENTIAL: Good  CLINICAL DECISION MAKING: Stable/uncomplicated  EVALUATION COMPLEXITY: Low  GOALS: Goals reviewed with patient? Yes  SHORT TERM GOALS: Target date: 12/05/2023  Pt will be independent with administered HEP to demonstrate the competency  necessary for long term managemnet of symptoms at home.  Baseline: Goal status: MET 12/19/2023  LONG TERM GOALS: Target date: 12/26/2023  Pt. Will achieve a DASH score of 22 as to demonstrate improvement in self-perceived functional ability with daily activities.  Baseline: 32 Goal status: ONGOING 12/19/2023 (45)  2.  Pt will report pain levels improving during ADLs to be less than or equal to 2/10 as to demonstrate improved  tolerance with daily functional activities such as driving and dressing. Baseline: 4/10 Goal status: ONGOING 12/19/2023 (4/10)  3.  Pt will improve MMT scores or global L shoulder to a 4+/5 to demonstrate improvement in strength for quality of motion and activity performance.  Baseline: see obj chart Goal status: ONGOING 12/19/2023  4.  Pt will achieve L shoulder flexion/abduction AROM of 160 degrees to demonstrate mobility necessary for independent overhead mobility tasks. Baseline: see obj chart Goal status: MET 12/19/2023 ---------------------------------------------------------------------------------------------  PLAN: PT FREQUENCY: 1-2x/week  PT DURATION: 6 weeks  PLANNED INTERVENTIONS: 97110-Therapeutic exercises, 97530- Therapeutic activity, 97112- Neuromuscular re-education, 97535- Self Care, 02859- Manual therapy, Patient/Family education, Taping, Joint mobilization, Spinal mobilization, Scar mobilization, and DME instructions  PLAN FOR NEXT SESSION:d/c     PHYSICAL THERAPY DISCHARGE SUMMARY  Visits from Start of Care: 10  Current functional level related to goals / functional outcomes: See assessment   Remaining deficits: See assessment   Education / Equipment: See assessment   Patient agrees to discharge. Patient goals were partially met. Patient is being discharged due to did not respond to therapy.    Mabel Kiang, PT, DPT 12/19/2023, 11:31 AM  For all possible CPT codes, reference the Planned Interventions line above.     Check all conditions that are expected to impact treatment: {Conditions expected to impact treatment:None of these apply   If treatment provided at initial evaluation, no treatment charged due to lack of authorization.

## 2023-12-22 ENCOUNTER — Encounter

## 2023-12-25 ENCOUNTER — Encounter

## 2023-12-27 ENCOUNTER — Ambulatory Visit (INDEPENDENT_AMBULATORY_CARE_PROVIDER_SITE_OTHER): Admitting: Orthopedic Surgery

## 2023-12-27 DIAGNOSIS — M5412 Radiculopathy, cervical region: Secondary | ICD-10-CM

## 2023-12-27 NOTE — Progress Notes (Signed)
 Orthopedic Spine Surgery Office Note   Assessment: Patient is a 53 y.o. male with neck and bilateral shoulder pain consistent with cervical radiculopathy. 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 I explained to him that some of his pain does seem to becoming from the shoulders but a component does seem to be radicular in nature. This means that he would still likely have some shoulder pain after surgery. After our conversation, patient elected to proceed with surgery. -Patient will next be seen at date of surgery     The patient has cervical radiculopathy, which was initially treated with non-operative measures. The patient's symptoms failed to improve with conservative treatments, so operative management was discussed in the form of C3-5 anterior cervical discectomy and fusion. The risks, including but not limited to pseudarthrosis, dysphagia, hematoma, airway compromise, recurrent laryngeal nerve injury, esophageal perforation, durotomy, spinal cord injury, nerve root injury, persistent pain, adjacent segment disease, infection, bleeding, hardware failure, vascular injury, heart attack, death, stroke, fracture, and need for additional procedures were discussed with the patient. The benefit of surgery would be improvement in the patient's radiating arm pain. Explained that the patient may not get full relief of his pain with this surgery, especially any neck pain. The alternatives to surgical management would be continued monitoring, physical therapy, over-the-counter pain medications, injections, traction, and activity modification. All the patient's questions were answered to his satisfaction. After this discussion, the patient expressed  understanding and elected to proceed with surgical intervention.      ___________________________________________________________________________     History:   Patient is a 53 y.o. male who presents today for follow up on his cervical spine.  Patient continues to have significant neck and bilateral shoulder pain.  He feels the pain over the area of the trapezius and the anterior shoulder.  On the left side, it radiates down the lateral aspect of the arm to the level of the elbow.  He does not have any pain radiating past the elbow.  He has no pain rating past the shoulder on the right side.  He feels the pain with activity and at rest.  He does note some shoulder pain is worse with overhead activity.  He has gotten cervical injections in the past and felt that they would give him 2 weeks of 80% of relief but then pain would return after that 2 weeks.     Physical Exam:   General: no acute distress, appears stated age Neurologic: alert, answering questions appropriately, following commands Respiratory: unlabored breathing on room air, symmetric chest rise Psychiatric: appropriate affect, normal cadence to speech     MSK (spine):   -Strength exam                                                   Left                  Right Grip strength                5/5  5/5 Interosseus                  5/5                  5/5 Wrist extension            5/5                  5/5 Wrist flexion                 5/5                  5/5 Elbow flexion                5/5                  5/5 Deltoid                          4/5                  5/5   -Sensory exam                           Sensation intact to light touch in C5-T1 nerve distributions of bilateral upper extremities   -Spurling: Negative bilaterally -Hoffman sign: Negative bilaterally -Clonus: No beats bilaterally -Interosseous wasting: None seen -Grip and release test: Negative -Romberg: Negative -Gait:  Normal  -Pain with external rotation past 70 degrees at both shoulders, pain but no weakness with Jobe test at both shoulders, no weakness with external rotation with arm at side bilaterally, negative belly press bilaterally   Imaging: XRs of the cervical spine from 09/18/2023 were previously independently reviewed and interpreted, showing disc height loss at C4/5. No other significant degenerative changes. No evidence of instability on flexion/extension views. No fracture or dislocation seen.    MRI of the cervical spine from 08/20/2023 was previously independently reviewed and interpreted, showing central stenosis with bilateral foraminal stenosis at C3/4. Central stenosis with bilateral foraminal stenosis at C4/5. T2 cord signal change at C4/5. Left sided foraminal stenosis at C5/6. Left sided foraminal stenosis at C6/7.      Patient name: Michael Fleming Patient MRN: [id number] Date of visit: 12/27/23

## 2023-12-29 ENCOUNTER — Encounter

## 2024-01-02 ENCOUNTER — Encounter: Admitting: Physical Therapy

## 2024-01-03 NOTE — Therapy (Signed)
 OUTPATIENT PHYSICAL THERAPY CERVICAL EVALUATION   Patient Name: Michael Fleming MRN: [id number] DOB:06/21/1970, 53 y.o., male Today's Date: 01/10/2024  END OF SESSION:  PT End of Session - 01/10/24 1049     Visit Number 1    Number of Visits 3    Date for PT Re-Evaluation 01/24/24    PT Start Time 1000    PT Stop Time 1040    PT Time Calculation (min) 40 min    Activity Tolerance Patient tolerated treatment well    Behavior During Therapy [facility] for tasks assessed/performed          Past Medical History:  Diagnosis Date   Allergy    Biceps tendon tear    right   Heart palpitations 12/2018   Hyperlipidemia    Hypertension    Hypokalemia 12/2018   Seizures (HCC)    last seizure age 69 due to blood clot- 1981 none since    Past Surgical History:  Procedure Laterality Date   BRAIN SURGERY     1981 blot clot removed   FOOT SURGERY     right bunion removed   KNEE ARTHROSCOPY Left    SHOULDER ARTHROSCOPY WITH SUBACROMIAL DECOMPRESSION AND BICEP TENDON REPAIR Right 03/31/2021   Procedure: RIGHT SHOULDER ARTHROSCOPY, EXTENSIVE DEBRIDEMENT, SUBACROMIAL DECOMPRESSION, OPEN BICEPS TENODESIS;  Surgeon: Jerri Kay HERO, MD;  Location:  SURGERY CENTER;  Service: Orthopedics;  Laterality: Right;   WRIST SURGERY Left    Patient Active Problem List   Diagnosis Date Noted   Impingement syndrome of left shoulder 09/12/2023   Partial nontraumatic tear of left rotator cuff 09/12/2023   Osteoarthritis of shoulder (Left) 06/22/2023   Arthritis of left acromioclavicular joint 06/22/2023   Cervicalgia 05/23/2023   Abnormal CT scan, cervical spine (12/07/2022) 05/08/2023   Chronic upper extremity pain (Left) 05/08/2023   Cervical radiculopathy (Left) 05/08/2023   Arthralgia of acromioclavicular joint (Right) 01/03/2023   Osteoarthritis of AC (acromioclavicular) joint (Left) 01/03/2023   Arthralgia of lacromioclavicular joint (Left) 01/03/2023   Chronic shoulder pain (Left)  12/15/2022   Chronic low back pain (Bilateral) w/o sciatica 12/15/2022   Lipid screening 10/07/2022   Cervicogenic headache 09/28/2022   DDD (degenerative disc disease), cervical 09/28/2022   Cervical foraminal stenosis (Bilateral) 09/28/2022   Radicular shoulder pain 09/28/2022   Cervical facet hypertrophy 09/28/2022   Cervical radiculopathy at C6 09/28/2022   Cervical disc disorder at C4-C5 level with radiculopathy 09/28/2022   Chronic shoulder pain (1ry area of Pain) (Right) 04/11/2022    Class: Chronic   Partial tear of subscapularis tendon, sequela (Right) 04/11/2022   Traumatic rupture of biceps tendon, sequela (Right) 04/11/2022   Need for immunization against influenza 04/08/2022   Chronic pain syndrome 02/16/2022   Pharmacologic therapy 02/16/2022   Disorder of skeletal system 02/16/2022   Problems influencing health status 02/16/2022   Osteoarthritis of AC (acromioclavicular) joint (Right) 11/12/2021   Tendinopathy of biceps tendon (Right)    Degenerative superior 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) tear of shoulder (Right)    Tendinosis of rotator cuff (Right)    Nontraumatic tear of supraspinatus tendon (Right) 03/04/2021   Hyperlipidemia 02/05/2019   Hypokalemia 02/05/2019   Vitamin D  insufficiency 02/05/2019   Tick bite of groin 11/27/2018   Blurry vision 08/06/2018   Pain in wrist (Right) 03/27/2017   Chronic knee pain (Left) 03/27/2017   HTN (hypertension) 10/23/2016   History of brain surgery 10/23/2016    PCP: Oley Bascom RAMAN, NP  REFERRING PROVIDER: Georgina Ozell LABOR, MD  REFERRING DIAG: M54.12 (ICD-10-CM) - Radiculopathy, cervical region   Rationale for Evaluation and Treatment: Rehabilitation  THERAPY DIAG:  Cervicalgia  Abnormal posture  PERTINENT HISTORY: Cervicogenic headaches, see PMH  WEIGHT BEARING RESTRICTIONS: No  FALLS:  Has patient fallen in last 6 months? No  LIVING ENVIRONMENT: Lives with: lives with their family Lives in:  House/apartment Stairs: No Has following equipment at home: None  OCCUPATION: Hospital doctor for [employer]   PRECAUTIONS: None ---------------------------------------------------------------------------------------------  SUBJECTIVE:                                                                                                                                                                                                         SUBJECTIVE STATEMENT: Eval statement 01/03/2024: was going to a pain clinic in [place] for shoulder pain where he was receiving steroid injections. F/u MRI demonstrated degeneration n the cervical spine. Currently severe enough for surgeon to consider surgery. Has B n/t into the arms and hands  Hand dominance: Right  Pain 0/10  RED FLAGS: Cervical red flags: none noted   PLOF: Independent  PATIENT GOALS: come for a few weeks.  NEXT MD VISIT: 03/04/2024 ---------------------------------------------------------------------------------------------  OBJECTIVE:  Note: Objective measures were completed at Evaluation unless otherwise noted.  DIAGNOSTIC FINDINGS:  FINDINGS: Cervical spine alignment is normal with flexion and extension. There is no evidence for fracture. There is mild disc space narrowing and endplate osteophyte formation throughout the cervical spine similar to the prior study. There is no prevertebral soft tissue swelling.   IMPRESSION: Mild degenerative changes throughout the cervical spine.     Electronically Signed   By: Greig Pique M.D.   On: 09/28/2023 23:48  PATIENT SURVEYS:  NDI  COGNITION: Overall cognitive status: Within functional limits for tasks assessed  SENSATION: Light touch: Impaired   POSTURE: rounded shoulders and forward head  PALPATION: Tenderness to L upper trap   CERVICAL ROM:   Active ROM A/PROM (deg) eval  Flexion 70%  Extension 100%  Right lateral flexion 80%  Left lateral flexion 80%   Right rotation 80%  Left rotation 80%   (Blank rows = not tested)  ! Indicates pain with testing  UPPER EXTREMITY ROM:  Active ROM Right eval Left eval  Shoulder flexion [facility]  Shoulder extension [facility]  Shoulder abduction [name]  Shoulder adduction    Shoulder extension    Shoulder internal rotation    Shoulder external rotation    Elbow flexion    Elbow extension    Wrist flexion    Wrist extension  Wrist ulnar deviation    Wrist radial deviation    Wrist pronation    Wrist supination     (Blank rows = not tested) ! Indicates pain with testing  UPPER EXTREMITY MMT:  MMT Right eval Left eval  Shoulder flexion 5 5  Shoulder extension 5 5  Shoulder abduction 5 5  Shoulder adduction    Shoulder extension    Shoulder internal rotation    Shoulder external rotation    Middle trapezius    Lower trapezius    Elbow flexion    Elbow extension    Wrist flexion    Wrist extension    Wrist ulnar deviation    Wrist radial deviation    Wrist pronation    Wrist supination    Grip strength 110 112   (Blank rows = not tested)  ! Indicates pain with testing   CERVICAL SPECIAL TESTS:  Neck flexor muscle endurance test: Positive, Spurling's test: Negative, and Distraction test: Negative  [facility] Adult PT Treatment:                                                DATE: 01/03/2024 Self Care: Pt education POC discussion                                                                                                                              PATIENT EDUCATION:  Education details: Pt received education regarding HEP performance, ADL performance, functional activity tolerance, impairment education, appropriate performance of therapeutic activities. Person educated: Patient Education method: Explanation, Demonstration, Tactile cues, Verbal cues, and Handouts Education comprehension: verbalized understanding and returned demonstration  HOME EXERCISE PROGRAM: Access  Code: [email or web address] URL: [email or web address] Date: 01/10/2024 Prepared by: Mabel Kiang  Exercises - Cervical Retraction with Resistance  - 1 x daily - 7 x weekly - 2-3 sets - 12 reps - 3s hold - Seated Upper Trapezius Stretch  - 1 x daily - 7 x weekly - 2 sets - 1 reps - 77m hold ---------------------------------------------------------------------------------------------  ASSESSMENT:  CLINICAL IMPRESSION:  Eval impression (01/03/2024): Pt. attended today's physical therapy session for evaluation of Cervical radiculopathy. Pt attends with complaints of 0/10 pain, primarily wants activities he can continue doing at home with Long term HEP. Pt requests short POC to ensure competence with exercises. Pt has notable deficits and would benefit from therapeutic focus on resting tone of L upper trap, cervical ROM, and neck flexor endurance/strength.  Treatment performed today focused on pt education detailed in the objective. Pt demonstrated good understanding of education provided. required minimal v/t cues and no assistance for appropriate performance with today's activities. Pt requires the intervention of skilled outpatient physical therapy to address the aforementioned deficits and progress towards a functional level in line with therapeutic goals.    OBJECTIVE IMPAIRMENTS: decreased mobility, hypomobility, impaired  UE functional use, postural dysfunction, and pain.   ACTIVITY LIMITATIONS: carrying  PARTICIPATION LIMITATIONS: driving and occupation  PERSONAL FACTORS: Past/current experiences and Time since onset of injury/illness/exacerbation are also affecting patient's functional outcome.   REHAB POTENTIAL: Good  CLINICAL DECISION MAKING: Stable/uncomplicated  EVALUATION COMPLEXITY: Low   GOALS: Goals reviewed with patient? Yes  SHORT - Long TERM GOALS: Target date: 01/24/2024  Pt will be independent with administered HEP to demonstrate the competency necessary for long  term managemnet of symptoms at home.  Baseline:  Goal status: INITIAL  2. Pt. Will achieve a NDI score of 10/50 as to demonstrate improvement in self-perceived functional ability with daily activities. Baseline: 16/50 (32%) Goal status: INITIAL  ---------------------------------------------------------------------------------------------  PLAN:  PT FREQUENCY: 1-2x/week  PT DURATION: 2 weeks  PLANNED INTERVENTIONS: 97110-Therapeutic exercises, 97530- Therapeutic activity, 97112- Neuromuscular re-education, 97535- Self Care, [id number]- Manual therapy, [phone]- Aquatic Therapy, Patient/Family education, Balance training, Stair training, Taping, Joint mobilization, and Spinal mobilization  PLAN FOR NEXT SESSION: review HEP, Begin POC as detailed In assessment.  Mabel Kiang, PT, DPT 01/10/2024, 10:50 AM

## 2024-01-05 ENCOUNTER — Encounter: Payer: Self-pay | Admitting: Orthopaedic Surgery

## 2024-01-05 ENCOUNTER — Ambulatory Visit (INDEPENDENT_AMBULATORY_CARE_PROVIDER_SITE_OTHER): Admitting: Orthopaedic Surgery

## 2024-01-05 ENCOUNTER — Encounter: Admitting: Physical Therapy

## 2024-01-05 DIAGNOSIS — G8929 Other chronic pain: Secondary | ICD-10-CM

## 2024-01-05 DIAGNOSIS — M25512 Pain in left shoulder: Secondary | ICD-10-CM

## 2024-01-05 NOTE — Progress Notes (Signed)
 Post-Op Visit Note   Patient: Michael Fleming           Date of Birth: Jan 18, 1971           MRN: [id number] Visit Date: 01/05/2024 PCP: Oley Bascom RAMAN, NP   Assessment & Plan:  Chief Complaint:  Chief Complaint  Patient presents with   Left Shoulder - Follow-up    11/02/23 LT SHOULDER SCOPE   Visit Diagnoses:  1. Chronic left shoulder pain     Plan: History of Present Illness [name] is a 53 year old male who presents for a follow-up visit six weeks after shoulder scope.  He experiences significant discomfort when lying on his stomach, particularly when positioning his arm beside him. While standing, he does not feel much pain and can move his arm behind him, but finds it difficult to do so when lying down due to pain. He has a sensation of weakness when raising his arm and feels a strain in the shoulder area upon lowering it. These symptoms are more pronounced when lying down compared to standing.  Exam of the left shoulder shows fully healed surgical scars.  He has good active and passive range of motion.  He has some slight weakness to manual muscle testing of the rotator cuff.  Assessment and Plan Postoperative left shoulder pain and weakness Six weeks post-surgery. Pain when prone, difficulty with arm positioning. Weakness and strain on arm elevation. Progress in range of motion noted. Symptoms consistent with post-surgical muscular weakness. - Continue physical therapy. - Follow up with physical therapist Manuelita. - Schedule recheck appointment in six weeks.  Follow-Up Instructions: Return in about 6 weeks (around 02/16/2024) for with lindsey.   Orders:  No orders of the defined types were placed in this encounter.  No orders of the defined types were placed in this encounter.   Imaging: No results found.  PMFS History: Patient Active Problem List   Diagnosis Date Noted   Impingement syndrome of left shoulder 09/12/2023   Partial nontraumatic tear of  left rotator cuff 09/12/2023   Osteoarthritis of shoulder (Left) 06/22/2023   Arthritis of left acromioclavicular joint 06/22/2023   Cervicalgia 05/23/2023   Abnormal CT scan, cervical spine (12/07/2022) 05/08/2023   Chronic upper extremity pain (Left) 05/08/2023   Cervical radiculopathy (Left) 05/08/2023   Arthralgia of acromioclavicular joint (Right) 01/03/2023   Osteoarthritis of AC (acromioclavicular) joint (Left) 01/03/2023   Arthralgia of lacromioclavicular joint (Left) 01/03/2023   Chronic shoulder pain (Left) 12/15/2022   Chronic low back pain (Bilateral) w/o sciatica 12/15/2022   Lipid screening 10/07/2022   Cervicogenic headache 09/28/2022   DDD (degenerative disc disease), cervical 09/28/2022   Cervical foraminal stenosis (Bilateral) 09/28/2022   Radicular shoulder pain 09/28/2022   Cervical facet hypertrophy 09/28/2022   Cervical radiculopathy at C6 09/28/2022   Cervical disc disorder at C4-C5 level with radiculopathy 09/28/2022   Chronic shoulder pain (1ry area of Pain) (Right) 04/11/2022    Class: Chronic   Partial tear of subscapularis tendon, sequela (Right) 04/11/2022   Traumatic rupture of biceps tendon, sequela (Right) 04/11/2022   Need for immunization against influenza 04/08/2022   Chronic pain syndrome 02/16/2022   Pharmacologic therapy 02/16/2022   Disorder of skeletal system 02/16/2022   Problems influencing health status 02/16/2022   Osteoarthritis of AC (acromioclavicular) joint (Right) 11/12/2021   Tendinopathy of biceps tendon (Right)    Degenerative superior 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) tear of shoulder (Right)    Tendinosis of rotator cuff (Right)  Nontraumatic tear of supraspinatus tendon (Right) 03/04/2021   Hyperlipidemia 02/05/2019   Hypokalemia 02/05/2019   Vitamin D  insufficiency 02/05/2019   Tick bite of groin 11/27/2018   Blurry vision 08/06/2018   Pain in wrist (Right) 03/27/2017   Chronic knee pain (Left) 03/27/2017   HTN  (hypertension) 10/23/2016   History of brain surgery 10/23/2016   Past Medical History:  Diagnosis Date   Allergy    Biceps tendon tear    right   Heart palpitations 12/2018   Hyperlipidemia    Hypertension    Hypokalemia 12/2018   Seizures (HCC)    last seizure age 71 due to blood clot- 1981 none since     Family History  Problem Relation Age of Onset   Healthy Mother    Hypertension Father    Colon cancer Neg Hx    Colon polyps Neg Hx    Esophageal cancer Neg Hx    Rectal cancer Neg Hx    Stomach cancer Neg Hx     Past Surgical History:  Procedure Laterality Date   BRAIN SURGERY     1981 blot clot removed   FOOT SURGERY     right bunion removed   KNEE ARTHROSCOPY Left    SHOULDER ARTHROSCOPY WITH SUBACROMIAL DECOMPRESSION AND BICEP TENDON REPAIR Right 03/31/2021   Procedure: RIGHT SHOULDER ARTHROSCOPY, EXTENSIVE DEBRIDEMENT, SUBACROMIAL DECOMPRESSION, OPEN BICEPS TENODESIS;  Surgeon: Jerri Kay HERO, MD;  Location: [facility];  Service: Orthopedics;  Laterality: Right;   WRIST SURGERY Left    Social History   Occupational History   Not on file  Tobacco Use   Smoking status: Never   Smokeless tobacco: Never  Vaping Use   Vaping status: Never Used  Substance and Sexual Activity   Alcohol use: Yes    Comment: occasional   Drug use: No   Sexual activity: Not Currently

## 2024-01-10 ENCOUNTER — Ambulatory Visit: Attending: Nurse Practitioner | Admitting: Physical Therapy

## 2024-01-10 ENCOUNTER — Encounter: Payer: Self-pay | Admitting: Physical Therapy

## 2024-01-10 ENCOUNTER — Other Ambulatory Visit: Payer: Self-pay

## 2024-01-10 DIAGNOSIS — M542 Cervicalgia: Secondary | ICD-10-CM | POA: Diagnosis not present

## 2024-01-10 DIAGNOSIS — M5412 Radiculopathy, cervical region: Secondary | ICD-10-CM | POA: Diagnosis not present

## 2024-01-10 DIAGNOSIS — R293 Abnormal posture: Secondary | ICD-10-CM | POA: Insufficient documentation

## 2024-01-12 ENCOUNTER — Other Ambulatory Visit: Payer: Self-pay | Admitting: Nurse Practitioner

## 2024-01-12 DIAGNOSIS — E7849 Other hyperlipidemia: Secondary | ICD-10-CM

## 2024-01-17 ENCOUNTER — Ambulatory Visit: Admitting: Physical Therapy

## 2024-01-17 ENCOUNTER — Encounter: Payer: Self-pay | Admitting: Physical Therapy

## 2024-01-17 DIAGNOSIS — M542 Cervicalgia: Secondary | ICD-10-CM

## 2024-01-17 DIAGNOSIS — M5412 Radiculopathy, cervical region: Secondary | ICD-10-CM | POA: Diagnosis not present

## 2024-01-17 DIAGNOSIS — R293 Abnormal posture: Secondary | ICD-10-CM | POA: Diagnosis not present

## 2024-01-17 NOTE — Therapy (Signed)
 OUTPATIENT PHYSICAL THERAPY CERVICAL EVALUATION   Patient Name: Michael Fleming MRN: [id number] DOB:1970-06-22, 53 y.o., male Today's Date: 01/17/2024  END OF SESSION:  PT End of Session - 01/17/24 0746     Visit Number 2    Number of Visits 3    Date for PT Re-Evaluation 01/24/24    Authorization Type Aetna    Authorization Time Period AETNA 2025/ [employer]-secondary patient fully covered as long as MCD plan is active.    PT Start Time 0745    PT Stop Time 0825    PT Time Calculation (min) 40 min    Activity Tolerance Patient tolerated treatment well    Behavior During Therapy [facility] for tasks assessed/performed          Past Medical History:  Diagnosis Date   Allergy    Biceps tendon tear    right   Heart palpitations 12/2018   Hyperlipidemia    Hypertension    Hypokalemia 12/2018   Seizures (HCC)    last seizure age 55 due to blood clot- 1981 none since    Past Surgical History:  Procedure Laterality Date   BRAIN SURGERY     1981 blot clot removed   FOOT SURGERY     right bunion removed   KNEE ARTHROSCOPY Left    SHOULDER ARTHROSCOPY WITH SUBACROMIAL DECOMPRESSION AND BICEP TENDON REPAIR Right 03/31/2021   Procedure: RIGHT SHOULDER ARTHROSCOPY, EXTENSIVE DEBRIDEMENT, SUBACROMIAL DECOMPRESSION, OPEN BICEPS TENODESIS;  Surgeon: Jerri Kay HERO, MD;  Location: [facility];  Service: Orthopedics;  Laterality: Right;   WRIST SURGERY Left    Patient Active Problem List   Diagnosis Date Noted   Impingement syndrome of left shoulder 09/12/2023   Partial nontraumatic tear of left rotator cuff 09/12/2023   Osteoarthritis of shoulder (Left) 06/22/2023   Arthritis of left acromioclavicular joint 06/22/2023   Cervicalgia 05/23/2023   Abnormal CT scan, cervical spine (12/07/2022) 05/08/2023   Chronic upper extremity pain (Left) 05/08/2023   Cervical radiculopathy (Left) 05/08/2023   Arthralgia of acromioclavicular joint (Right) 01/03/2023    Osteoarthritis of AC (acromioclavicular) joint (Left) 01/03/2023   Arthralgia of lacromioclavicular joint (Left) 01/03/2023   Chronic shoulder pain (Left) 12/15/2022   Chronic low back pain (Bilateral) w/o sciatica 12/15/2022   Lipid screening 10/07/2022   Cervicogenic headache 09/28/2022   DDD (degenerative disc disease), cervical 09/28/2022   Cervical foraminal stenosis (Bilateral) 09/28/2022   Radicular shoulder pain 09/28/2022   Cervical facet hypertrophy 09/28/2022   Cervical radiculopathy at C6 09/28/2022   Cervical disc disorder at C4-C5 level with radiculopathy 09/28/2022   Chronic shoulder pain (1ry area of Pain) (Right) 04/11/2022    Class: Chronic   Partial tear of subscapularis tendon, sequela (Right) 04/11/2022   Traumatic rupture of biceps tendon, sequela (Right) 04/11/2022   Need for immunization against influenza 04/08/2022   Chronic pain syndrome 02/16/2022   Pharmacologic therapy 02/16/2022   Disorder of skeletal system 02/16/2022   Problems influencing health status 02/16/2022   Osteoarthritis of AC (acromioclavicular) joint (Right) 11/12/2021   Tendinopathy of biceps tendon (Right)    Degenerative superior 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) tear of shoulder (Right)    Tendinosis of rotator cuff (Right)    Nontraumatic tear of supraspinatus tendon (Right) 03/04/2021   Hyperlipidemia 02/05/2019   Hypokalemia 02/05/2019   Vitamin D  insufficiency 02/05/2019   Tick bite of groin 11/27/2018   Blurry vision 08/06/2018   Pain in wrist (Right) 03/27/2017   Chronic knee pain (Left) 03/27/2017  HTN (hypertension) 10/23/2016   History of brain surgery 10/23/2016    PCP: Oley Bascom RAMAN, NP  REFERRING PROVIDER: Georgina Ozell LABOR, MD  REFERRING DIAG: [phone] (ICD-10-CM) - Radiculopathy, cervical region   Rationale for Evaluation and Treatment: Rehabilitation  THERAPY DIAG:  Cervicalgia  Abnormal posture  PERTINENT HISTORY: Cervicogenic headaches, see  PMH  WEIGHT BEARING RESTRICTIONS: No  FALLS:  Has patient fallen in last 6 months? No  LIVING ENVIRONMENT: Lives with: lives with their family Lives in: House/apartment Stairs: No Has following equipment at home: None  OCCUPATION: Hospital doctor for [employer]   PRECAUTIONS: None ---------------------------------------------------------------------------------------------  SUBJECTIVE:                                                                                                                                                                                                         SUBJECTIVE STATEMENT: Pt attended today's session with reports of 4/10 pain after tweaking his neck while playing with his granddaughter over the weekend   Eval statement 01/03/2024: was going to a pain clinic in  for shoulder pain where he was receiving steroid injections. F/u MRI demonstrated degeneration n the cervical spine. Currently severe enough for surgeon to consider surgery. Has B n/t into the arms and hands  Hand dominance: Right  Pain 0/10  RED FLAGS: Cervical red flags: none noted   PLOF: Independent  PATIENT GOALS: come for a few weeks.  NEXT MD VISIT: 03/04/2024 ---------------------------------------------------------------------------------------------  OBJECTIVE:  Note: Objective measures were completed at Evaluation unless otherwise noted.  DIAGNOSTIC FINDINGS:  FINDINGS: Cervical spine alignment is normal with flexion and extension. There is no evidence for fracture. There is mild disc space narrowing and endplate osteophyte formation throughout the cervical spine similar to the prior study. There is no prevertebral soft tissue swelling.   IMPRESSION: Mild degenerative changes throughout the cervical spine.     Electronically Signed   By: Greig Pique M.D.   On: 09/28/2023 23:48  PATIENT SURVEYS:  NDI  COGNITION: Overall cognitive status: Within  functional limits for tasks assessed  SENSATION: Light touch: Impaired   POSTURE: rounded shoulders and forward head  PALPATION: Tenderness to L upper trap   CERVICAL ROM:   Active ROM A/PROM (deg) eval  Flexion 70%  Extension 100%  Right lateral flexion 80%  Left lateral flexion 80%  Right rotation 80%  Left rotation 80%   (Blank rows = not tested)  ! Indicates pain with testing  UPPER EXTREMITY ROM:  Active ROM Right eval Left eval  Shoulder flexion [facility]  Shoulder extension  [facility] WFL  Shoulder abduction [facility]  Shoulder adduction    Shoulder extension    Shoulder internal rotation    Shoulder external rotation    Elbow flexion    Elbow extension    Wrist flexion    Wrist extension    Wrist ulnar deviation    Wrist radial deviation    Wrist pronation    Wrist supination     (Blank rows = not tested) ! Indicates pain with testing  UPPER EXTREMITY MMT:  MMT Right eval Left eval  Shoulder flexion 5 5  Shoulder extension 5 5  Shoulder abduction 5 5  Shoulder adduction    Shoulder extension    Shoulder internal rotation    Shoulder external rotation    Middle trapezius    Lower trapezius    Elbow flexion    Elbow extension    Wrist flexion    Wrist extension    Wrist ulnar deviation    Wrist radial deviation    Wrist pronation    Wrist supination    Grip strength 110 112   (Blank rows = not tested)  ! Indicates pain with testing   CERVICAL SPECIAL TESTS:  Neck flexor muscle endurance test: Positive, Spurling's test: Negative, and Distraction test: Negative   [facility] Adult PT Treatment:                                                DATE: 01/17/2024  Therapeutic Exercise: PROM into cervical SB with L upper trap release Doorway pec stretch with cervical AROM in B rotatoin 2x12, 5s hold ea.   Therapeutic Activity: NuStep 6' for activity tolerance  L upper trap set at wall 1x10, hold 5s  Chest press with chin tuck  2x12, ecc focus,   75lb Omega lat pull down  2x12, 65lb Omega row 2x12, 65lb  [facility] Adult PT Treatment:                                                DATE: 01/03/2024 Self Care: Pt education POC discussion                                                                                                                              PATIENT EDUCATION:  Education details: Pt received education regarding HEP performance, ADL performance, functional activity tolerance, impairment education, appropriate performance of therapeutic activities. Person educated: Patient Education method: Explanation, Demonstration, Tactile cues, Verbal cues, and Handouts Education comprehension: verbalized understanding and returned demonstration  HOME EXERCISE PROGRAM: Access Code: [email or web address] URL: [email or web address] Date: 01/10/2024 Prepared by: Mabel Kiang  Exercises - Cervical Retraction with Resistance  - 1 x daily - 7 x  weekly - 2-3 sets - 12 reps - 3s hold - Seated Upper Trapezius Stretch  - 1 x daily - 7 x weekly - 2 sets - 1 reps - 18m hold ---------------------------------------------------------------------------------------------  ASSESSMENT:  CLINICAL IMPRESSION: Pt attended physical therapy session for continuation of treatment regarding neck pain. Today's treatment focused on improvement of  Shoulder/neck motility, mobility, and cervical stability. Pt showed great tolerance to administered treatment with no adverse effects by the end of session. Skilled intervention was utilized via activity modification for pt tolerance with task completion, functional progression/regression promoting best outcomes inline with current rehab goals, as well as minimal verbal/tactile cuing alongside no physical assistance for safe and appropriate performance of today's activities. Re-eval next visit    Eval impression (01/03/2024): Pt. attended today's physical therapy session for evaluation of Cervical  radiculopathy. Pt attends with complaints of 0/10 pain, primarily wants activities he can continue doing at home with Long term HEP. Pt requests short POC to ensure competence with exercises. Pt has notable deficits and would benefit from therapeutic focus on resting tone of L upper trap, cervical ROM, and neck flexor endurance/strength.  Treatment performed today focused on pt education detailed in the objective. Pt demonstrated good understanding of education provided. required minimal v/t cues and no assistance for appropriate performance with today's activities. Pt requires the intervention of skilled outpatient physical therapy to address the aforementioned deficits and progress towards a functional level in line with therapeutic goals.    OBJECTIVE IMPAIRMENTS: decreased mobility, hypomobility, impaired UE functional use, postural dysfunction, and pain.   ACTIVITY LIMITATIONS: carrying  PARTICIPATION LIMITATIONS: driving and occupation  PERSONAL FACTORS: Past/current experiences and Time since onset of injury/illness/exacerbation are also affecting patient's functional outcome.   REHAB POTENTIAL: Good  CLINICAL DECISION MAKING: Stable/uncomplicated  EVALUATION COMPLEXITY: Low   GOALS: Goals reviewed with patient? Yes  SHORT - Long TERM GOALS: Target date: 01/24/2024  Pt will be independent with administered HEP to demonstrate the competency necessary for long term managemnet of symptoms at home.  Baseline:  Goal status: INITIAL  2. Pt. Will achieve a NDI score of 10/50 as to demonstrate improvement in self-perceived functional ability with daily activities. Baseline: 16/50 (32%) Goal status: INITIAL  ---------------------------------------------------------------------------------------------  PLAN:  PT FREQUENCY: 1-2x/week  PT DURATION: 2 weeks  PLANNED INTERVENTIONS: 97110-Therapeutic exercises, 97530- Therapeutic activity, 97112- Neuromuscular re-education, 97535- Self  Care, [id number]- Manual therapy, [phone]- Aquatic Therapy, Patient/Family education, Balance training, Stair training, Taping, Joint mobilization, and Spinal mobilization  PLAN FOR NEXT SESSION: re-eval next visit  Mabel Kiang, PT, DPT 01/17/2024, 8:25 AM

## 2024-01-24 ENCOUNTER — Ambulatory Visit: Attending: Nurse Practitioner | Admitting: Physical Therapy

## 2024-01-24 ENCOUNTER — Encounter: Payer: Self-pay | Admitting: Physical Therapy

## 2024-01-24 DIAGNOSIS — M6281 Muscle weakness (generalized): Secondary | ICD-10-CM | POA: Insufficient documentation

## 2024-01-24 DIAGNOSIS — M542 Cervicalgia: Secondary | ICD-10-CM | POA: Diagnosis not present

## 2024-01-24 DIAGNOSIS — R293 Abnormal posture: Secondary | ICD-10-CM | POA: Diagnosis not present

## 2024-01-24 NOTE — Therapy (Signed)
 OUTPATIENT PHYSICAL THERAPY CERVICAL EVALUATION   Patient Name: Michael Fleming MRN: [id number] DOB:[birth year], 53 y.o., male Today's Date: 01/24/2024  END OF SESSION:  PT End of Session - 01/24/24 0842     Visit Number 3    Number of Visits 3    Date for PT Re-Evaluation 01/24/24    Authorization Type Aetna    Authorization Time Period AETNA 2025/ [employer] MCD-secondary patient fully covered as long as MCD plan is active.    PT Start Time 0830    PT Stop Time [phone]    PT Time Calculation (min) 23 min    Activity Tolerance Patient tolerated treatment well    Behavior During Therapy [facility] for tasks assessed/performed           Past Medical History:  Diagnosis Date   Allergy    Biceps tendon tear    right   Heart palpitations 12/2018   Hyperlipidemia    Hypertension    Hypokalemia 12/2018   Seizures (HCC)    last seizure age 16 due to blood clot- 1981 none since    Past Surgical History:  Procedure Laterality Date   BRAIN SURGERY     1981 blot clot removed   FOOT SURGERY     right bunion removed   KNEE ARTHROSCOPY Left    SHOULDER ARTHROSCOPY WITH SUBACROMIAL DECOMPRESSION AND BICEP TENDON REPAIR Right 03/31/2021   Procedure: RIGHT SHOULDER ARTHROSCOPY, EXTENSIVE DEBRIDEMENT, SUBACROMIAL DECOMPRESSION, OPEN BICEPS TENODESIS;  Surgeon: Jerri Kay HERO, MD;  Location: [facility];  Service: Orthopedics;  Laterality: Right;   WRIST SURGERY Left    Patient Active Problem List   Diagnosis Date Noted   Impingement syndrome of left shoulder 09/12/2023   Partial nontraumatic tear of left rotator cuff 09/12/2023   Osteoarthritis of shoulder (Left) 06/22/2023   Arthritis of left acromioclavicular joint 06/22/2023   Cervicalgia 05/23/2023   Abnormal CT scan, cervical spine (12/07/2022) 05/08/2023   Chronic upper extremity pain (Left) 05/08/2023   Cervical radiculopathy (Left) 05/08/2023   Arthralgia of acromioclavicular joint (Right) 01/03/2023    Osteoarthritis of AC (acromioclavicular) joint (Left) 01/03/2023   Arthralgia of lacromioclavicular joint (Left) 01/03/2023   Chronic shoulder pain (Left) 12/15/2022   Chronic low back pain (Bilateral) w/o sciatica 12/15/2022   Lipid screening 10/07/2022   Cervicogenic headache 09/28/2022   DDD (degenerative disc disease), cervical 09/28/2022   Cervical foraminal stenosis (Bilateral) 09/28/2022   Radicular shoulder pain 09/28/2022   Cervical facet hypertrophy 09/28/2022   Cervical radiculopathy at C6 09/28/2022   Cervical disc disorder at C4-C5 level with radiculopathy 09/28/2022   Chronic shoulder pain (1ry area of Pain) (Right) 04/11/2022    Class: Chronic   Partial tear of subscapularis tendon, sequela (Right) 04/11/2022   Traumatic rupture of biceps tendon, sequela (Right) 04/11/2022   Need for immunization against influenza 04/08/2022   Chronic pain syndrome 02/16/2022   Pharmacologic therapy 02/16/2022   Disorder of skeletal system 02/16/2022   Problems influencing health status 02/16/2022   Osteoarthritis of AC (acromioclavicular) joint (Right) 11/12/2021   Tendinopathy of biceps tendon (Right)    Degenerative superior 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) tear of shoulder (Right)    Tendinosis of rotator cuff (Right)    Nontraumatic tear of supraspinatus tendon (Right) 03/04/2021   Hyperlipidemia 02/05/2019   Hypokalemia 02/05/2019   Vitamin D  insufficiency 02/05/2019   Tick bite of groin 11/27/2018   Blurry vision 08/06/2018   Pain in wrist (Right) 03/27/2017   Chronic knee pain (Left)  03/27/2017   HTN (hypertension) 10/23/2016   History of brain surgery 10/23/2016    PCP: Oley Bascom RAMAN, NP  REFERRING PROVIDER: Georgina Ozell LABOR, MD  REFERRING DIAG: [phone] (ICD-10-CM) - Radiculopathy, cervical region   Rationale for Evaluation and Treatment: Rehabilitation  THERAPY DIAG:  Cervicalgia  Abnormal posture  Muscle weakness (generalized)  PERTINENT  HISTORY: Cervicogenic headaches, see PMH  WEIGHT BEARING RESTRICTIONS: No  FALLS:  Has patient fallen in last 6 months? No  LIVING ENVIRONMENT: Lives with: lives with their family Lives in: House/apartment Stairs: No Has following equipment at home: None  OCCUPATION: Hospital doctor for [employer]   PRECAUTIONS: None ---------------------------------------------------------------------------------------------  SUBJECTIVE:                                                                                                                                                                                                         SUBJECTIVE STATEMENT: Pt attended today's session with reports of 4/10 pain. Pt stated that they have maintained good compliance with current HEP.  Feels like his pain is primarily due to how he is sleeping on a day to day basis. Pt want's to get the MRI to ensure whether or not surgery is necessary.    Eval statement 01/03/2024: was going to a pain clinic in [place] for shoulder pain where he was receiving steroid injections. F/u MRI demonstrated degeneration n the cervical spine. Currently severe enough for surgeon to consider surgery. Has B n/t into the arms and hands  Hand dominance: Right  Pain 0/10  RED FLAGS: Cervical red flags: none noted   PLOF: Independent  PATIENT GOALS: come for a few weeks.  NEXT MD VISIT: 03/04/2024 ---------------------------------------------------------------------------------------------  OBJECTIVE:  Note: Objective measures were completed at Evaluation unless otherwise noted.  DIAGNOSTIC FINDINGS:  FINDINGS: Cervical spine alignment is normal with flexion and extension. There is no evidence for fracture. There is mild disc space narrowing and endplate osteophyte formation throughout the cervical spine similar to the prior study. There is no prevertebral soft tissue swelling.   IMPRESSION: Mild degenerative changes  throughout the cervical spine.     Electronically Signed   By: Greig Pique M.D.   On: 09/28/2023 23:48  PATIENT SURVEYS:  NDI: 16/50 (32%) 01/24/2024: 14/50 (28%)  COGNITION: Overall cognitive status: Within functional limits for tasks assessed  SENSATION: Light touch: Impaired   POSTURE: rounded shoulders and forward head  PALPATION: Tenderness to L upper trap   CERVICAL ROM:   Active ROM A/PROM (deg) eval  Flexion 70%  Extension 100%  Right lateral flexion 80%  Left lateral flexion 80%  Right rotation 80%  Left rotation 80%   (Blank rows = not tested)  ! Indicates pain with testing  UPPER EXTREMITY ROM:  Active ROM Right eval Left eval  Shoulder flexion [facility]  Shoulder extension [facility]  Shoulder abduction [facility]  Shoulder adduction    Shoulder extension    Shoulder internal rotation    Shoulder external rotation    Elbow flexion    Elbow extension    Wrist flexion    Wrist extension    Wrist ulnar deviation    Wrist radial deviation    Wrist pronation    Wrist supination     (Blank rows = not tested) ! Indicates pain with testing  UPPER EXTREMITY MMT:  MMT Right eval Left eval  Shoulder flexion 5 5  Shoulder extension 5 5  Shoulder abduction 5 5  Shoulder adduction    Shoulder extension    Shoulder internal rotation    Shoulder external rotation    Middle trapezius    Lower trapezius    Elbow flexion    Elbow extension    Wrist flexion    Wrist extension    Wrist ulnar deviation    Wrist radial deviation    Wrist pronation    Wrist supination    Grip strength 110 112   (Blank rows = not tested)  ! Indicates pain with testing   CERVICAL SPECIAL TESTS:  Neck flexor muscle endurance test: Positive, Spurling's test: Negative, and Distraction test: Negative   [facility] Adult PT Treatment:                                                DATE: 01/24/2024   Therapeutic Activity: Objective measures Goal assessment, functional  testing    [facility] Adult PT Treatment:                                                DATE: 01/17/2024  Therapeutic Exercise: PROM into cervical SB with L upper trap release Doorway pec stretch with cervical AROM in B rotatoin 2x12, 5s hold ea.   Therapeutic Activity: NuStep 6' for activity tolerance  L upper trap set at wall 1x10, hold 5s  Chest press with chin tuck  2x12, ecc focus,  75lb Omega lat pull down  2x12, 65lb Omega row 2x12, 65lb  [facility] Adult PT Treatment:                                                DATE: 01/03/2024 Self Care: Pt education POC discussion  PATIENT EDUCATION:  Education details: Pt received education regarding HEP performance, ADL performance, functional activity tolerance, impairment education, appropriate performance of therapeutic activities. Person educated: Patient Education method: Explanation, Demonstration, Tactile cues, Verbal cues, and Handouts Education comprehension: verbalized understanding and returned demonstration  HOME EXERCISE PROGRAM: Access Code: [email or web address] URL: [email or web address] Date: 01/10/2024 Prepared by: Mabel Kiang  Exercises - Cervical Retraction with Resistance  - 1 x daily - 7 x weekly - 2-3 sets - 12 reps - 3s hold - Seated Upper Trapezius Stretch  - 1 x daily - 7 x weekly - 2 sets - 1 reps - 85m hold ---------------------------------------------------------------------------------------------  ASSESSMENT:  CLINICAL IMPRESSION: Pt attended physical therapy session for re-evaluation of neck pain. Pt has met one goals and continues to work towards 1 other. Difficulties continue with self perceived levels of function. Pt required minimal cuing as well as no assistance for safe and appropriate performance of today's activities. Education and discussion was had regarding  efficacy of proposed surgery, comparing to his level of function versus conventional indications for surgery. Pt was educated to research and discuss with his surgeon following the results of the MRI he plans to get. Further education was given to continue applying ADL education from previous sessions as well as performing HEP as prescribed with freedom to progress as tolerated using previous education on modification and exercise dosage. Pt has displayed and verbalized competence regarding this education.      Eval impression (01/03/2024): Pt. attended today's physical therapy session for evaluation of Cervical radiculopathy. Pt attends with complaints of 0/10 pain, primarily wants activities he can continue doing at home with Long term HEP. Pt requests short POC to ensure competence with exercises. Pt has notable deficits and would benefit from therapeutic focus on resting tone of L upper trap, cervical ROM, and neck flexor endurance/strength.  Treatment performed today focused on pt education detailed in the objective. Pt demonstrated good understanding of education provided. required minimal v/t cues and no assistance for appropriate performance with today's activities. Pt requires the intervention of skilled outpatient physical therapy to address the aforementioned deficits and progress towards a functional level in line with therapeutic goals.    OBJECTIVE IMPAIRMENTS: decreased mobility, hypomobility, impaired UE functional use, postural dysfunction, and pain.   ACTIVITY LIMITATIONS: carrying  PARTICIPATION LIMITATIONS: driving and occupation  PERSONAL FACTORS: Past/current experiences and Time since onset of injury/illness/exacerbation are also affecting patient's functional outcome.   REHAB POTENTIAL: Good  CLINICAL DECISION MAKING: Stable/uncomplicated  EVALUATION COMPLEXITY: Low   GOALS: Goals reviewed with patient? Yes  SHORT - Long TERM GOALS: Target date: 01/24/2024  Pt will  be independent with administered HEP to demonstrate the competency necessary for long term managemnet of symptoms at home.  Baseline:  Goal status: INITIAL  2. Pt. Will achieve a NDI score of 10/50 as to demonstrate improvement in self-perceived functional ability with daily activities. Baseline: 16/50 (32%) Goal status: INITIAL  ---------------------------------------------------------------------------------------------  PLAN:  PT FREQUENCY: 1-2x/week  PT DURATION: 2 weeks  PLANNED INTERVENTIONS: 97110-Therapeutic exercises, 97530- Therapeutic activity, 97112- Neuromuscular re-education, 97535- Self Care, [id number]- Manual therapy, [phone]- Aquatic Therapy, Patient/Family education, Balance training, Stair training, Taping, Joint mobilization, and Spinal mobilization  PLAN FOR NEXT SESSION: re-eval next visit  Mabel Kiang, PT, DPT 01/24/2024, 9:01 AM   PHYSICAL THERAPY DISCHARGE SUMMARY  Visits from Start of Care: 3  Current functional level related to goals / functional outcomes: See assessment   Remaining deficits: See assessment   Education / Equipment: See assessment  Patient agrees to discharge. Patient goals were partially met. Patient is being discharged due to the patient's request.

## 2024-02-14 NOTE — Progress Notes (Signed)
 Surgical Instructions   Your procedure is scheduled on Tuesday February 20, 2024. Report to [facility] Main Entrance A at 5:30 A.M., then check in with the Admitting office. Any questions or running late day of surgery: call [phone]  Questions prior to your surgery date: call [phone], Monday-Friday, 8am-4pm. If you experience any cold or flu symptoms such as cough, fever, chills, shortness of breath, etc. between now and your scheduled surgery, please notify us  at the above number.     Remember:  Do not eat after midnight the night before your surgery  You may drink clear liquids until 4:30 the morning of your surgery.   Clear liquids allowed are: Water, Non-Citrus Juices (without pulp), Carbonated Beverages, Clear Tea (no milk, honey, etc.), Black Coffee Only (NO MILK, CREAM OR POWDERED CREAMER of any kind), and Gatorade.  Patient Instructions  The night before surgery:  No food after midnight. ONLY clear liquids after midnight  The day of surgery (if you do NOT have diabetes):  Drink ONE (1) Pre-Surgery Clear Ensure by 4:30 the morning of surgery. Drink in one sitting. Do not sip.  This drink was given to you during your hospital  pre-op appointment visit.  Nothing else to drink after completing the  Pre-Surgery Clear Ensure.         If you have questions, please contact your surgeon's office.   Take these medicines the morning of surgery with A SIP OF WATER  amLODipine  (NORVASC )  atorvastatin  (LIPITOR)    May take these medicines IF NEEDED: HYDROcodone -acetaminophen  (NORCO/VICODIN)  methocarbamol  (ROBAXIN )  ondansetron  (ZOFRAN )  oxyCODONE -acetaminophen  (PERCOCET)    One week prior to surgery, STOP taking any Aspirin (unless otherwise instructed by your surgeon) Aleve , Naproxen , Ibuprofen , Motrin , Advil , Goody's, BC's, all herbal medications, fish oil, and non-prescription vitamins.                     Do NOT Smoke (Tobacco/Vaping) for 24 hours prior to  your procedure.  If you use a CPAP at night, you may bring your mask/headgear for your overnight stay.   You will be asked to remove any contacts, glasses, piercing's, hearing aid's, dentures/partials prior to surgery. Please bring cases for these items if needed.    Patients discharged the day of surgery will not be allowed to drive home, and someone needs to stay with them for 24 hours.  SURGICAL WAITING ROOM VISITATION Patients may have no more than 2 support people in the waiting area - these visitors may rotate.   Pre-op nurse will coordinate an appropriate time for 1 ADULT support person, who may not rotate, to accompany patient in pre-op.  Children under the age of 16 must have an adult with them who is not the patient and must remain in the main waiting area with an adult.  If the patient needs to stay at the hospital during part of their recovery, the visitor guidelines for inpatient rooms apply.  Please refer to the [facility] website for the visitor guidelines for any additional information.   If you received a COVID test during your pre-op visit  it is requested that you wear a mask when out in public, stay away from anyone that may not be feeling well and notify your surgeon if you develop symptoms. If you have been in contact with anyone that has tested positive in the last 10 days please notify you surgeon.      Pre-operative 5 CHG Bathing Instructions   You can  play a key role in reducing the risk of infection after surgery. Your skin needs to be as free of germs as possible. You can reduce the number of germs on your skin by washing with CHG (chlorhexidine gluconate) soap before surgery. CHG is an antiseptic soap that kills germs and continues to kill germs even after washing.   DO NOT use if you have an allergy to chlorhexidine/CHG or antibacterial soaps. If your skin becomes reddened or irritated, stop using the CHG and notify one of our RNs at [phone].   Please  shower with the CHG soap starting 4 days before surgery using the following schedule:     Please keep in mind the following:  You may shave your face at any point before/day of surgery.  Place clean sheets on your bed the day you start using CHG soap. Use a clean washcloth (not used since being washed) for each shower. DO NOT sleep with pets once you start using the CHG.   CHG Shower Instructions:  Wash your face and private area with normal soap. If you choose to wash your hair, wash first with your normal shampoo.  After you use shampoo/soap, rinse your hair and body thoroughly to remove shampoo/soap residue.  Turn the water OFF and apply about 3 tablespoons (45 ml) of CHG soap to a CLEAN washcloth.  Apply CHG soap ONLY FROM YOUR NECK DOWN TO YOUR TOES (washing for 3-5 minutes)  DO NOT use CHG soap on face, private areas, open wounds, or sores.  Pay special attention to the area where your surgery is being performed.  If you are having back surgery, having someone wash your back for you may be helpful. Wait 2 minutes after CHG soap is applied, then you may rinse off the CHG soap.  Pat dry with a clean towel  Put on clean clothes/pajamas   If you choose to wear lotion, please use ONLY the CHG-compatible lotions that are listed below.  Additional instructions for the day of surgery: DO NOT APPLY any lotions, deodorants or cologne.   Do not bring valuables to the hospital. [facility] is not responsible for any belongings/valuables. Do not wear jewelry  Put on clean/comfortable clothes.  Please brush your teeth.  Ask your nurse before applying any prescription medications to the skin.     CHG Compatible Lotions   Aveeno Moisturizing lotion  Cetaphil Moisturizing Cream  Cetaphil Moisturizing Lotion  Clairol Herbal Essence Moisturizing Lotion, Dry Skin  Clairol Herbal Essence Moisturizing Lotion, Extra Dry Skin  Clairol Herbal Essence Moisturizing Lotion, Normal Skin  Curel Age  Defying Therapeutic Moisturizing Lotion with Alpha Hydroxy  Curel Extreme Care Body Lotion  Curel Soothing Hands Moisturizing Hand Lotion  Curel Therapeutic Moisturizing Cream, Fragrance-Free  Curel Therapeutic Moisturizing Lotion, Fragrance-Free  Curel Therapeutic Moisturizing Lotion, Original Formula  Eucerin Daily Replenishing Lotion  Eucerin Dry Skin Therapy Plus Alpha Hydroxy Crme  Eucerin Dry Skin Therapy Plus Alpha Hydroxy Lotion  Eucerin Original Crme  Eucerin Original Lotion  Eucerin Plus Crme Eucerin Plus Lotion  Eucerin TriLipid Replenishing Lotion  Keri Anti-Bacterial Hand Lotion  Keri Deep Conditioning Original Lotion Dry Skin Formula Softly Scented  Keri Deep Conditioning Original Lotion, Fragrance Free Sensitive Skin Formula  Keri Lotion Fast Absorbing Fragrance Free Sensitive Skin Formula  Keri Lotion Fast Absorbing Softly Scented Dry Skin Formula  Keri Original Lotion  Keri Skin Renewal Lotion Keri Silky Smooth Lotion  Keri Silky Smooth Sensitive Skin Lotion  Nivea Body Creamy Conditioning Oil  Nivea Body Extra Enriched Teacher, adult education Moisturizing Lotion Nivea Crme  Nivea Skin Firming Lotion  NutraDerm 30 Skin Lotion  NutraDerm Skin Lotion  NutraDerm Therapeutic Skin Cream  NutraDerm Therapeutic Skin Lotion  ProShield Protective Hand Cream  Provon moisturizing lotion  Please read over the following fact sheets that you were given.

## 2024-02-15 ENCOUNTER — Encounter (HOSPITAL_COMMUNITY): Payer: Self-pay

## 2024-02-15 ENCOUNTER — Other Ambulatory Visit: Payer: Self-pay

## 2024-02-15 ENCOUNTER — Encounter (HOSPITAL_COMMUNITY)
Admission: RE | Admit: 2024-02-15 | Discharge: 2024-02-15 | Disposition: A | Discharge status: Home / Self Care | Source: Ambulatory Visit | Attending: Orthopedic Surgery | Admitting: Orthopedic Surgery

## 2024-02-15 VITALS — BP 134/94 | HR 79 | Temp 98.6°F | Resp 18 | Ht 67.0 in | Wt 219.0 lb

## 2024-02-15 DIAGNOSIS — Z01818 Encounter for other preprocedural examination: Secondary | ICD-10-CM | POA: Insufficient documentation

## 2024-02-15 LAB — BASIC METABOLIC PANEL WITH GFR
Anion gap: 9 (ref 5–15)
BUN: 12 mg/dL (ref 6–20)
CO2: 28 mmol/L (ref 22–32)
Calcium: 9.3 mg/dL (ref 8.9–10.3)
Chloride: 103 mmol/L (ref 98–111)
Creatinine, Ser: 1.13 mg/dL (ref 0.61–1.24)
GFR, Estimated: >60 mL/min (ref >60)
Glucose, Bld: 117 mg/dL — ABNORMAL HIGH (ref 70–99)
Potassium: 3.8 mmol/L (ref 3.5–5.1)
Sodium: 140 mmol/L (ref 135–145)

## 2024-02-15 LAB — SURGICAL PCR SCREEN
MRSA, PCR: NEGATIVE
Staphylococcus aureus: NEGATIVE

## 2024-02-15 LAB — CBC
HCT: 39.3 % (ref 39.0–52.0)
Hemoglobin: 13.3 g/dL (ref 13.0–17.0)
MCH: 28.2 pg (ref 26.0–34.0)
MCHC: 33.8 g/dL (ref 30.0–36.0)
MCV: 83.3 fL (ref 80.0–100.0)
Platelets: 282 K/uL (ref 150–400)
RBC: 4.72 MIL/uL (ref 4.22–5.81)
RDW: 12.1 % (ref 11.5–15.5)
WBC: 4.3 K/uL (ref 4.0–10.5)
nRBC: 0 % (ref 0.0–0.2)

## 2024-02-15 NOTE — Progress Notes (Signed)
 PCP - Dr. Koren Borer Cardiologist - Denies  PPM/ICD - Denies Device Orders - n/a Rep Notified - n/a  Chest x-ray - N/A EKG - 02-15-24 Stress Test - Denies ECHO - Denies Cardiac Cath - Denies  Sleep Study - Denies CPAP - n/a  NON-diabetic  Last dose of GLP1 agonist-  Denies GLP1 instructions: n/a  Blood Thinner Instructions: Denies Aspirin Instructions: Denies   ERAS Protcol - Clears until 0430 PRE-SURGERY Ensure or G2- Ensure  COVID TEST- n/a   Anesthesia review: Yes, HTN, HLD, seizures as child  Patient denies shortness of breath, fever, cough and chest pain at PAT appointment. Patient denies  any respiratory issues at this time.    All instructions explained to the patient, with a verbal understanding of the material. Patient agrees to go over the instructions while at home for a better understanding. Patient also instructed to self quarantine after being tested for COVID-19. The opportunity to ask questions was provided.

## 2024-02-15 NOTE — Progress Notes (Signed)
 Surgical Instructions     Your procedure is scheduled on Tuesday February 20, 2024. Report to [facility] Main Entrance A at 5:30 A.M., then check in with the Admitting office. Any questions or running late day of surgery: call [phone]   Questions prior to your surgery date: call [phone], Monday-Friday, 8am-4pm. If you experience any cold or flu symptoms such as cough, fever, chills, shortness of breath, etc. between now and your scheduled surgery, please notify us  at the above number.            Remember:       Do not eat after midnight the night before your surgery   You may drink clear liquids until 4:30 the morning of your surgery.   Clear liquids allowed are: Water, Non-Citrus Juices (without pulp), Carbonated Beverages, Clear Tea (no milk, honey, etc.), Black Coffee Only (NO MILK, CREAM OR POWDERED CREAMER of any kind), and Gatorade.   Patient Instructions   The night before surgery:  No food after midnight. ONLY clear liquids after midnight   The day of surgery (if you do NOT have diabetes):  Drink ONE (1) Pre-Surgery Clear Ensure by 4:30 the morning of surgery. Drink in one sitting. Do not sip.  This drink was given to you during your hospital  pre-op appointment visit.   Nothing else to drink after completing the  Pre-Surgery Clear Ensure.          If you have questions, please contact your surgeon's office.     Take these medicines the morning of surgery with A SIP OF WATER  amLODipine  (NORVASC )  atorvastatin  (LIPITOR)      May take these medicines IF NEEDED: NONE     One week prior to surgery, STOP taking any Aspirin (unless otherwise instructed by your surgeon) Aleve , Naproxen , Ibuprofen , Motrin , Advil , Goody's, BC's, all herbal medications, fish oil, and non-prescription vitamins.                     Do NOT Smoke (Tobacco/Vaping) for 24 hours prior to your procedure.   If you use a CPAP at night, you may bring your mask/headgear for your  overnight stay.   You will be asked to remove any contacts, glasses, piercing's, hearing aid's, dentures/partials prior to surgery. Please bring cases for these items if needed.    Patients discharged the day of surgery will not be allowed to drive home, and someone needs to stay with them for 24 hours.   SURGICAL WAITING ROOM VISITATION Patients may have no more than 2 support people in the waiting area - these visitors may rotate.   Pre-op nurse will coordinate an appropriate time for 1 ADULT support person, who may not rotate, to accompany patient in pre-op.  Children under the age of 40 must have an adult with them who is not the patient and must remain in the main waiting area with an adult.   If the patient needs to stay at the hospital during part of their recovery, the visitor guidelines for inpatient rooms apply.   Please refer to the [facility] website for the visitor guidelines for any additional information.     If you received a COVID test during your pre-op visit  it is requested that you wear a mask when out in public, stay away from anyone that may not be feeling well and notify your surgeon if you develop symptoms. If you have been in contact with anyone that has tested positive in  the last 10 days please notify you surgeon.         Pre-operative 5 CHG Bathing Instructions    You can play a key role in reducing the risk of infection after surgery. Your skin needs to be as free of germs as possible. You can reduce the number of germs on your skin by washing with CHG (chlorhexidine gluconate) soap before surgery. CHG is an antiseptic soap that kills germs and continues to kill germs even after washing.    DO NOT use if you have an allergy to chlorhexidine/CHG or antibacterial soaps. If your skin becomes reddened or irritated, stop using the CHG and notify one of our RNs at [phone].    Please shower with the CHG soap starting 4 days before surgery using the following  schedule:       Please keep in mind the following:  You may shave your face at any point before/day of surgery.  Place clean sheets on your bed the day you start using CHG soap. Use a clean washcloth (not used since being washed) for each shower. DO NOT sleep with pets once you start using the CHG.    CHG Shower Instructions:  Wash your face and private area with normal soap. If you choose to wash your hair, wash first with your normal shampoo.  After you use shampoo/soap, rinse your hair and body thoroughly to remove shampoo/soap residue.  Turn the water OFF and apply about 3 tablespoons (45 ml) of CHG soap to a CLEAN washcloth.  Apply CHG soap ONLY FROM YOUR NECK DOWN TO YOUR TOES (washing for 3-5 minutes)  DO NOT use CHG soap on face, private areas, open wounds, or sores.  Pay special attention to the area where your surgery is being performed.  If you are having back surgery, having someone wash your back for you may be helpful. Wait 2 minutes after CHG soap is applied, then you may rinse off the CHG soap.  Pat dry with a clean towel  Put on clean clothes/pajamas   If you choose to wear lotion, please use ONLY the CHG-compatible lotions that are listed below.   Additional instructions for the day of surgery: DO NOT APPLY any lotions, deodorants or cologne.   Do not bring valuables to the hospital. [facility] is not responsible for any belongings/valuables. Do not wear jewelry  Put on clean/comfortable clothes.  Please brush your teeth.  Ask your nurse before applying any prescription medications to the skin.        CHG Compatible Lotions    Aveeno Moisturizing lotion  Cetaphil Moisturizing Cream  Cetaphil Moisturizing Lotion  Clairol Herbal Essence Moisturizing Lotion, Dry Skin  Clairol Herbal Essence Moisturizing Lotion, Extra Dry Skin  Clairol Herbal Essence Moisturizing Lotion, Normal Skin  Curel Age Defying Therapeutic Moisturizing Lotion with Alpha Hydroxy  Curel  Extreme Care Body Lotion  Curel Soothing Hands Moisturizing Hand Lotion  Curel Therapeutic Moisturizing Cream, Fragrance-Free  Curel Therapeutic Moisturizing Lotion, Fragrance-Free  Curel Therapeutic Moisturizing Lotion, Original Formula  Eucerin Daily Replenishing Lotion  Eucerin Dry Skin Therapy Plus Alpha Hydroxy Crme  Eucerin Dry Skin Therapy Plus Alpha Hydroxy Lotion  Eucerin Original Crme  Eucerin Original Lotion  Eucerin Plus Crme Eucerin Plus Lotion  Eucerin TriLipid Replenishing Lotion  Keri Anti-Bacterial Hand Lotion  Keri Deep Conditioning Original Lotion Dry Skin Formula Softly Scented  Keri Deep Conditioning Original Lotion, Fragrance Free Sensitive Skin Formula  Keri Lotion Fast Absorbing Fragrance Free Sensitive Skin Formula  Keri Lotion Fast Absorbing Softly Scented Dry Skin Formula  Keri Original Lotion  Keri Skin Renewal Lotion Keri Silky Smooth Lotion  Keri Silky Smooth Sensitive Skin Lotion  Nivea Body Creamy Conditioning Patent examiner Moisturizing Lotion Nivea Crme  Nivea Skin Firming Lotion  NutraDerm 30 Skin Lotion  NutraDerm Skin Lotion  NutraDerm Therapeutic Skin Cream  NutraDerm Therapeutic Skin Lotion  ProShield Protective Hand Cream  Provon moisturizing lotion   Please read over the following fact sheets that you were given.

## 2024-02-19 NOTE — H&P (Signed)
 Orthopedic Spine Surgery H&P Note  Assessment: Patient is a 53 y.o. male with cervical radiculopathy   Plan: -Out of bed as tolerated, activity as tolerated, no brace -Covered the risks of surgery one more time with the patient and patient elected to proceed with planned surgery -Written consent verified -Hold anticoagulation in anticipation of surgery -Ancef  and TXA on all to OR -NPO for procedure -Site marked -To OR when ready  Covered the risks of surgery again this morning which included but were not limited to: pseudarthrosis, dysphagia, hematoma, airway compromise, recurrent laryngeal nerve injury, esophageal perforation, durotomy, spinal cord injury, nerve root injury, persistent pain, adjacent segment disease, infection, bleeding, hardware failure, vascular injury, heart attack, death, stroke, fracture, and need for additional procedures.  ___________________________________________________________________________  Chief Complaint: neck pain that radiates into bilateral upper extremities  History: Patient is 53 y.o. male who has been previously seen in the office for neck pain that radiates into bilateral shoulders. Work up was consistent with cervical radiculopathy. His symptoms failed to improve with conservative treatment so operative management was discussed at the last office visit. The patient presents today with no changes in his symptoms since the last office visit. See previous office note for further details.    Review of systems: General: denies fevers and chills, myalgias Neurologic: denies recent changes in vision, slurred speech Abdomen: denies nausea, vomiting, hematemesis Respiratory: denies cough, shortness of breath  Past medical history: HLD HTN   Allergies: NKDA   Past surgical history:  Brain surgery Right bunion surgery Bilateral shoulder arthroscopy Left knee arthroscopy Left wrist surgery   Social history: Denies use of nicotine product  (smoking, vaping, patches, smokeless) Alcohol use: rare Denies recreational drug use  Family history: -reviewed and not pertinent to cervical radiculopathy   Physical Exam:  General: no acute distress, appears stated age Neurologic: alert, answering questions appropriately, following commands Cardiovascular: regular rate, no cyanosis Respiratory: unlabored breathing on room air, symmetric chest rise Psychiatric: appropriate affect, normal cadence to speech   MSK (spine):  -Strength exam      Left  Right Grip strength                5/5  5/5 Interosseus   5/5   5/5 Wrist extension  5/5  5/5 Wrist flexion   5/5  5/5 Elbow extension  5/5  5/5 Elbow flexion   5/5  5/5 Deltoid    4/5  5/5  -Sensory exam    Sensation intact to light touch in C5-T1 nerve distributions of bilateral upper extremities   Patient name: Michael Fleming Patient MRN: [id number] Date: 02/20/2024

## 2024-02-20 ENCOUNTER — Other Ambulatory Visit: Payer: Self-pay

## 2024-02-20 ENCOUNTER — Other Ambulatory Visit (HOSPITAL_COMMUNITY): Payer: Self-pay

## 2024-02-20 ENCOUNTER — Encounter (HOSPITAL_COMMUNITY)
Admission: RE | Disposition: A | Discharge status: Home / Self Care | Payer: Self-pay | Source: Home / Self Care | Attending: Orthopedic Surgery

## 2024-02-20 ENCOUNTER — Ambulatory Visit (HOSPITAL_COMMUNITY): Admitting: Anesthesiology

## 2024-02-20 ENCOUNTER — Ambulatory Visit (HOSPITAL_COMMUNITY)

## 2024-02-20 ENCOUNTER — Ambulatory Visit (HOSPITAL_COMMUNITY): Payer: Self-pay | Admitting: Physician Assistant

## 2024-02-20 ENCOUNTER — Encounter (HOSPITAL_COMMUNITY): Payer: Self-pay | Admitting: Orthopedic Surgery

## 2024-02-20 ENCOUNTER — Ambulatory Visit (HOSPITAL_COMMUNITY)
Admission: RE | Admit: 2024-02-20 | Discharge: 2024-02-20 | Disposition: A | Discharge status: Home / Self Care | Attending: Orthopedic Surgery | Admitting: Orthopedic Surgery

## 2024-02-20 DIAGNOSIS — M4802 Spinal stenosis, cervical region: Secondary | ICD-10-CM | POA: Insufficient documentation

## 2024-02-20 DIAGNOSIS — I10 Essential (primary) hypertension: Secondary | ICD-10-CM | POA: Insufficient documentation

## 2024-02-20 DIAGNOSIS — M1990 Unspecified osteoarthritis, unspecified site: Secondary | ICD-10-CM | POA: Diagnosis not present

## 2024-02-20 DIAGNOSIS — Z01818 Encounter for other preprocedural examination: Secondary | ICD-10-CM

## 2024-02-20 DIAGNOSIS — M5412 Radiculopathy, cervical region: Secondary | ICD-10-CM | POA: Diagnosis not present

## 2024-02-20 DIAGNOSIS — G709 Myoneural disorder, unspecified: Secondary | ICD-10-CM | POA: Diagnosis not present

## 2024-02-20 DIAGNOSIS — Z981 Arthrodesis status: Secondary | ICD-10-CM | POA: Diagnosis not present

## 2024-02-20 DIAGNOSIS — M2578 Osteophyte, vertebrae: Secondary | ICD-10-CM | POA: Insufficient documentation

## 2024-02-20 DIAGNOSIS — M4322 Fusion of spine, cervical region: Secondary | ICD-10-CM | POA: Diagnosis not present

## 2024-02-20 HISTORY — PX: ANTERIOR CERVICAL DECOMP/DISCECTOMY FUSION: SHX1161

## 2024-02-20 SURGERY — ANTERIOR CERVICAL DECOMPRESSION/DISCECTOMY FUSION 2 LEVELS
Anesthesia: General

## 2024-02-20 MED ORDER — POLYETHYLENE GLYCOL 3350 17 GM/SCOOP PO POWD
17.0000 g | Freq: Every day | ORAL | 0 refills | Status: AC
  Filled 2024-02-20: qty 238, 14d supply, fill #0

## 2024-02-20 MED ORDER — PHENYLEPHRINE 80 MCG/ML (10ML) SYRINGE FOR IV PUSH (FOR BLOOD PRESSURE SUPPORT)
PREFILLED_SYRINGE | INTRAVENOUS | Status: AC
  Filled 2024-02-20: qty 10

## 2024-02-20 MED ORDER — CEFAZOLIN SODIUM-DEXTROSE 2-4 GM/100ML-% IV SOLN
2.0000 g | INTRAVENOUS | Status: AC
  Administered 2024-02-20: 2 g via INTRAVENOUS
  Filled 2024-02-20: qty 100

## 2024-02-20 MED ORDER — ACETAMINOPHEN 500 MG PO TABS
1000.0000 mg | ORAL_TABLET | Freq: Three times a day (TID) | ORAL | 0 refills | Status: AC
  Filled 2024-02-20: qty 84, 14d supply, fill #0

## 2024-02-20 MED ORDER — PROPOFOL 10 MG/ML IV BOLUS
INTRAVENOUS | Status: AC
  Filled 2024-02-20: qty 20

## 2024-02-20 MED ORDER — PHENYLEPHRINE HCL-NACL 20-0.9 MG/250ML-% IV SOLN
INTRAVENOUS | Status: AC
  Filled 2024-02-20: qty 250

## 2024-02-20 MED ORDER — TRANEXAMIC ACID-NACL 1000-0.7 MG/100ML-% IV SOLN
1000.0000 mg | INTRAVENOUS | Status: AC
  Administered 2024-02-20: 1000 mg via INTRAVENOUS
  Filled 2024-02-20: qty 100

## 2024-02-20 MED ORDER — MIDAZOLAM HCL 2 MG/2ML IJ SOLN
INTRAMUSCULAR | Status: DC | PRN
  Administered 2024-02-20: 2 mg via INTRAVENOUS

## 2024-02-20 MED ORDER — CHLORHEXIDINE GLUCONATE 0.12 % MT SOLN
15.0000 mL | Freq: Once | OROMUCOSAL | Status: AC
  Administered 2024-02-20: 15 mL via OROMUCOSAL
  Filled 2024-02-20: qty 15

## 2024-02-20 MED ORDER — DROPERIDOL 2.5 MG/ML IJ SOLN: 0.6250 mg | Freq: Once | INTRAMUSCULAR | Status: DC | PRN

## 2024-02-20 MED ORDER — FENTANYL CITRATE (PF) 250 MCG/5ML IJ SOLN
INTRAMUSCULAR | Status: DC | PRN
  Administered 2024-02-20: 100 ug via INTRAVENOUS
  Administered 2024-02-20: 50 ug via INTRAVENOUS
  Administered 2024-02-20: 100 ug via INTRAVENOUS

## 2024-02-20 MED ORDER — ROCURONIUM BROMIDE 10 MG/ML (PF) SYRINGE
PREFILLED_SYRINGE | INTRAVENOUS | Status: DC | PRN
  Administered 2024-02-20: 20 mg via INTRAVENOUS
  Administered 2024-02-20: 30 mg via INTRAVENOUS
  Administered 2024-02-20: 50 mg via INTRAVENOUS

## 2024-02-20 MED ORDER — LIDOCAINE 2% (20 MG/ML) 5 ML SYRINGE
INTRAMUSCULAR | Status: DC | PRN
  Administered 2024-02-20: 40 mg via INTRAVENOUS

## 2024-02-20 MED ORDER — MIDAZOLAM HCL 2 MG/2ML IJ SOLN
INTRAMUSCULAR | Status: AC
  Filled 2024-02-20: qty 2

## 2024-02-20 MED ORDER — POVIDONE-IODINE 10 % EX SWAB
2.0000 | Freq: Once | CUTANEOUS | Status: AC
  Administered 2024-02-20: 2 via TOPICAL

## 2024-02-20 MED ORDER — ORAL CARE MOUTH RINSE: 15.0000 mL | Freq: Once | OROMUCOSAL | Status: AC

## 2024-02-20 MED ORDER — FENTANYL CITRATE (PF) 250 MCG/5ML IJ SOLN
INTRAMUSCULAR | Status: AC
  Filled 2024-02-20: qty 5

## 2024-02-20 MED ORDER — METHOCARBAMOL 500 MG PO TABS
500.0000 mg | ORAL_TABLET | Freq: Four times a day (QID) | ORAL | 0 refills | Status: AC
  Filled 2024-02-20: qty 40, 10d supply, fill #0

## 2024-02-20 MED ORDER — DEXMEDETOMIDINE HCL IN NACL 80 MCG/20ML IV SOLN
INTRAVENOUS | Status: AC
  Filled 2024-02-20: qty 20

## 2024-02-20 MED ORDER — OXYCODONE HCL 5 MG PO TABS: 5.0000 mg | ORAL_TABLET | Freq: Once | ORAL | Status: DC | PRN

## 2024-02-20 MED ORDER — DEXAMETHASONE SODIUM PHOSPHATE 10 MG/ML IJ SOLN
10.0000 mg | Freq: Once | INTRAMUSCULAR | Status: AC
  Administered 2024-02-20: 10 mg via INTRAVENOUS

## 2024-02-20 MED ORDER — ONDANSETRON HCL 4 MG/2ML IJ SOLN
INTRAMUSCULAR | Status: AC
Start: 2024-02-20 — End: 2024-02-20
  Filled 2024-02-20: qty 2

## 2024-02-20 MED ORDER — SODIUM CHLORIDE 0.9 % IV SOLN: INTRAVENOUS | Status: DC | PRN

## 2024-02-20 MED ORDER — ROCURONIUM BROMIDE 10 MG/ML (PF) SYRINGE
PREFILLED_SYRINGE | INTRAVENOUS | Status: AC
  Filled 2024-02-20: qty 10

## 2024-02-20 MED ORDER — LIDOCAINE 2% (20 MG/ML) 5 ML SYRINGE
INTRAMUSCULAR | Status: AC
  Filled 2024-02-20: qty 5

## 2024-02-20 MED ORDER — PHENYLEPHRINE HCL-NACL 20-0.9 MG/250ML-% IV SOLN
INTRAVENOUS | Status: DC | PRN
  Administered 2024-02-20: 10 ug/min via INTRAVENOUS

## 2024-02-20 MED ORDER — DEXMEDETOMIDINE HCL IN NACL 80 MCG/20ML IV SOLN
INTRAVENOUS | Status: DC | PRN
Start: 2024-02-20 — End: 2024-02-20
  Administered 2024-02-20: 4 ug via INTRAVENOUS
  Administered 2024-02-20: 10 ug via INTRAVENOUS
  Administered 2024-02-20: 6 ug via INTRAVENOUS

## 2024-02-20 MED ORDER — ONDANSETRON HCL 4 MG/2ML IJ SOLN
INTRAMUSCULAR | Status: DC | PRN
Start: 2024-02-20 — End: 2024-02-20
  Administered 2024-02-20: 4 mg via INTRAVENOUS

## 2024-02-20 MED ORDER — LACTATED RINGERS IV SOLN: INTRAVENOUS | Status: DC

## 2024-02-20 MED ORDER — ACETAMINOPHEN 10 MG/ML IV SOLN
INTRAVENOUS | Status: DC | PRN
  Administered 2024-02-20: 1000 mg via INTRAVENOUS

## 2024-02-20 MED ORDER — MEPERIDINE HCL 25 MG/ML IJ SOLN
6.2500 mg | INTRAMUSCULAR | Status: DC | PRN
  Filled 2024-02-20: qty 1

## 2024-02-20 MED ORDER — PROPOFOL 10 MG/ML IV BOLUS
INTRAVENOUS | Status: DC | PRN
  Administered 2024-02-20: 150 mg via INTRAVENOUS

## 2024-02-20 MED ORDER — HYDROMORPHONE HCL 1 MG/ML IJ SOLN: 0.2500 mg | INTRAMUSCULAR | Status: DC | PRN

## 2024-02-20 MED ORDER — ACETAMINOPHEN 500 MG PO TABS
1000.0000 mg | ORAL_TABLET | Freq: Once | ORAL | Status: DC
Start: 2024-02-20 — End: 2024-02-20

## 2024-02-20 MED ORDER — 0.9 % SODIUM CHLORIDE (POUR BTL) OPTIME
TOPICAL | Status: DC | PRN
  Administered 2024-02-20: 1000 mL

## 2024-02-20 MED ORDER — OXYCODONE HCL 5 MG/5ML PO SOLN: 5.0000 mg | Freq: Once | ORAL | Status: DC | PRN

## 2024-02-20 MED ORDER — OXYCODONE HCL 5 MG PO TABS
5.0000 mg | ORAL_TABLET | ORAL | 0 refills | Status: AC | PRN
  Filled 2024-02-20: qty 40, 4d supply, fill #0

## 2024-02-20 MED ORDER — ACETAMINOPHEN 10 MG/ML IV SOLN: 1000.0000 mg | Freq: Once | INTRAVENOUS | Status: DC | PRN

## 2024-02-20 MED ORDER — ACETAMINOPHEN 10 MG/ML IV SOLN
INTRAVENOUS | Status: AC
  Filled 2024-02-20: qty 100

## 2024-02-20 MED ORDER — SENNA 8.6 MG PO TABS
1.0000 | ORAL_TABLET | Freq: Two times a day (BID) | ORAL | 0 refills | Status: AC
  Filled 2024-02-20: qty 28, 14d supply, fill #0

## 2024-02-20 MED ORDER — SUGAMMADEX SODIUM 200 MG/2ML IV SOLN
INTRAVENOUS | Status: DC | PRN
  Administered 2024-02-20: 50 mg via INTRAVENOUS
  Administered 2024-02-20 (×2): 25 mg via INTRAVENOUS
  Administered 2024-02-20: 75 mg via INTRAVENOUS
  Administered 2024-02-20: 25 mg via INTRAVENOUS

## 2024-02-20 MED ORDER — DEXAMETHASONE SODIUM PHOSPHATE 10 MG/ML IJ SOLN
INTRAMUSCULAR | Status: AC
  Filled 2024-02-20: qty 1

## 2024-02-20 SURGICAL SUPPLY — 65 items
ALCOHOL 70% 16 OZ (MISCELLANEOUS) ×2 IMPLANT
ALLOGRAFT LORDOTIC CC 7X11X14 (Bone Implant) IMPLANT
BAG COUNTER SPONGE SURGICOUNT (BAG) ×2 IMPLANT
BAND RUBBER #18 3X1/16 STRL (MISCELLANEOUS) ×4 IMPLANT
BLADE CLIPPER SURG (BLADE) IMPLANT
BUR MATCHSTICK NEURO 3.0 LAGG (BURR) ×2 IMPLANT
CABLE BIPOLOR RESECTION CORD (MISCELLANEOUS) ×2 IMPLANT
CANISTER SUCTION 3000ML PPV (SUCTIONS) ×2 IMPLANT
CLSR STERI-STRIP ANTIMIC 1/2X4 (GAUZE/BANDAGES/DRESSINGS) ×2 IMPLANT
COVER MAYO STAND STRL (DRAPES) ×4 IMPLANT
COVER SURGICAL LIGHT HANDLE (MISCELLANEOUS) ×4 IMPLANT
DERMABOND ADVANCED .7 DNX12 (GAUZE/BANDAGES/DRESSINGS) IMPLANT
DRAPE C-ARM 42X72 X-RAY (DRAPES) ×2 IMPLANT
DRAPE LAPAROTOMY 100X72X124 (DRAPES) ×2 IMPLANT
DRAPE MICROSCOPE LEICA (MISCELLANEOUS) ×2 IMPLANT
DRAPE MICROSCOPE NONGLARE (MISCELLANEOUS) ×2 IMPLANT
DRAPE POUCH INSTRU U-SHP 10X18 (DRAPES) ×2 IMPLANT
DRAPE SURG 17X11 SM STRL (DRAPES) ×8 IMPLANT
DRAPE SURG 17X23 STRL (DRAPES) ×2 IMPLANT
DRAPE U-SHAPE 47X51 STRL (DRAPES) ×2 IMPLANT
DRAPE UTILITY XL STRL (DRAPES) ×2 IMPLANT
DRSG OPSITE POSTOP 3X4 (GAUZE/BANDAGES/DRESSINGS) ×2 IMPLANT
DRSG TEGADERM 4X4.75 (GAUZE/BANDAGES/DRESSINGS) IMPLANT
DURAPREP 26ML APPLICATOR (WOUND CARE) ×2 IMPLANT
ELECT COATED BLADE 2.86 ST (ELECTRODE) ×2 IMPLANT
ELECTRODE BLADE INSULATED 4IN (ELECTROSURGICAL) ×2 IMPLANT
ELECTRODE REM PT RTRN 9FT ADLT (ELECTROSURGICAL) ×2 IMPLANT
GAUZE SPONGE 4X4 12PLY STRL (GAUZE/BANDAGES/DRESSINGS) IMPLANT
GLOVE BIO SURGEON STRL SZ7.5 (GLOVE) ×4 IMPLANT
GLOVE BIOGEL PI IND STRL 8 (GLOVE) IMPLANT
GLOVE INDICATOR 7.5 STRL GRN (GLOVE) ×2 IMPLANT
GOWN STRL REUS W/ TWL LRG LVL3 (GOWN DISPOSABLE) ×2 IMPLANT
GOWN STRL SURGICAL XL XLNG (GOWN DISPOSABLE) ×2 IMPLANT
KIT BASIN OR (CUSTOM PROCEDURE TRAY) ×2 IMPLANT
KIT TURNOVER KIT B (KITS) ×2 IMPLANT
NDL HYPO 22X1.5 SAFETY MO (MISCELLANEOUS) ×2 IMPLANT
NDL SPNL 18GX3.5 QUINCKE PK (NEEDLE) ×2 IMPLANT
NEEDLE HYPO 22X1.5 SAFETY MO (MISCELLANEOUS) ×1 IMPLANT
NEEDLE SPNL 18GX3.5 QUINCKE PK (NEEDLE) ×1 IMPLANT
PACK ORTHO CERVICAL (CUSTOM PROCEDURE TRAY) ×2 IMPLANT
PACK UNIVERSAL I (CUSTOM PROCEDURE TRAY) ×2 IMPLANT
PAD ARMBOARD POSITIONER FOAM (MISCELLANEOUS) ×4 IMPLANT
PENCIL BUTTON HOLSTER BLD 10FT (ELECTRODE) ×2 IMPLANT
PIN DISTRACTION MAXCESS-C 12 (PIN) IMPLANT
PLATE ACP 1.6X42 2LVL (Plate) IMPLANT
POSITIONER HEAD DONUT 9IN (MISCELLANEOUS) ×2 IMPLANT
RESTRAINT LIMB HOLDER UNIV (RESTRAINTS) ×2 IMPLANT
SCREW ACP VA SD 3.5X15 (Screw) IMPLANT
SOLN 0.9% NACL 1000 ML (IV SOLUTION) ×1 IMPLANT
SOLN 0.9% NACL POUR BTL 1000ML (IV SOLUTION) ×2 IMPLANT
SOLN STERILE WATER 1000 ML (IV SOLUTION) ×1 IMPLANT
SOLN STERILE WATER BTL 1000 ML (IV SOLUTION) ×2 IMPLANT
SPONGE INTESTINAL PEANUT (DISPOSABLE) ×2 IMPLANT
SPONGE SURGIFOAM ABS GEL SZ50 (HEMOSTASIS) ×2 IMPLANT
SURGIFLO W/THROMBIN 8M KIT (HEMOSTASIS) IMPLANT
SUT BONE WAX W31G (SUTURE) ×2 IMPLANT
SUT MNCRL AB 3-0 PS2 27 (SUTURE) ×2 IMPLANT
SUT VIC AB 2-0 CT2 18 VCP726D (SUTURE) ×4 IMPLANT
SYR BULB IRRIG 60ML STRL (SYRINGE) ×2 IMPLANT
TAPE CLOTH 4X10 WHT NS (GAUZE/BANDAGES/DRESSINGS) ×2 IMPLANT
TOWEL GREEN STERILE (TOWEL DISPOSABLE) ×2 IMPLANT
TOWEL GREEN STERILE FF (TOWEL DISPOSABLE) ×2 IMPLANT
TRAY FOLEY W/BAG SLVR 16FR ST (SET/KITS/TRAYS/PACK) ×2 IMPLANT
TUBE CONNECTING 20X1/4 (TUBING) IMPLANT
TUBING FEATHERFLOW (TUBING) ×2 IMPLANT

## 2024-02-20 NOTE — Anesthesia Procedure Notes (Addendum)
 Procedure Name: Intubation Date/Time: 02/20/2024 7:54 AM  Performed by: Hedy Jarred, CRNAPre-anesthesia Checklist: Patient identified, Emergency Drugs available, Suction available and Patient being monitored Patient Re-evaluated:Patient Re-evaluated prior to induction Oxygen Delivery Method: Circle System Utilized Preoxygenation: Pre-oxygenation with 100% oxygen Induction Type: IV induction Ventilation: Mask ventilation without difficulty Laryngoscope Size: Glidescope and 3 Grade View: Grade I Tube type: Oral Tube size: 8.0 mm Number of attempts: 1 Airway Equipment and Method: Stylet and Video-laryngoscopy Placement Confirmation: ETT inserted through vocal cords under direct vision, positive ETCO2 and breath sounds checked- equal and bilateral Secured at: 23 cm Tube secured with: Tape Dental Injury: Teeth and Oropharynx as per pre-operative assessment

## 2024-02-20 NOTE — Anesthesia Postprocedure Evaluation (Signed)
 Anesthesia Post Note  Patient: Michael Fleming  Procedure(s) Performed: ANTERIOR CERVICAL DECOMPRESSION/DISCECTOMY FUSION 2 LEVELS     Patient location during evaluation: PACU Anesthesia Type: General Level of consciousness: awake and alert Pain management: pain level controlled Vital Signs Assessment: post-procedure vital signs reviewed and stable Respiratory status: spontaneous breathing, nonlabored ventilation, respiratory function stable and patient connected to nasal cannula oxygen Cardiovascular status: blood pressure returned to baseline and stable Postop Assessment: no apparent nausea or vomiting Anesthetic complications: no   No notable events documented.  Last Vitals:  Vitals:   02/20/24 1400 02/20/24 1415  BP: (!) 141/85 (!) 150/87  Pulse: 96 98  Resp: 18 16  Temp:  37.1 C  SpO2: 94% 95%    Last Pain:  Vitals:   02/20/24 1250  TempSrc:   PainSc: Asleep                 Franky JONETTA Bald

## 2024-02-20 NOTE — Op Note (Signed)
 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 Item Serial No. Manufacturer Lot No. LRB No. Used Action  [id number] - TITAN #508 CERVICAL IMPLANT SET - 1          ALLOGRAFT LORDOT[employer] CC [id number] - [id number] Bone Implant ALLOGRAFT [employer] CC [id number] [facility] MEDICAL  N/A 1 Implanted  ALLOGRAFT LORDOTIC CC [id number] - [id number] Bone Implant ALLOGRAFT LORDOT[employer] CC [id number] [employer]  N/A 1 Implanted  PLATE ACP 8.3K57 2LVL - [id number] Plate PLATE ACP 8.3K57 2LVL  [employer]  N/A 1 Implanted  SCREW ACP VA SD 3.5X15 - [id number] Screw SCREW ACP VA SD 3.5X15  [employer]  N/A 6 Implanted      Indication for procedure: Patient is a 53 y.o. male who presented to the office with symptoms consistent with cervical radiculopathy. The patient had tried conservative treatments that did not provide any lasting relief. As result, operative management was discussed. A C3-5 anterior cervical discectomy and fusion was presented as a treatment option. The risks, including but not limited to pseudarthrosis, dysphagia, hematoma, airway compromise, recurrent laryngeal nerve injury, esophageal perforation, durotomy, spinal  cord injury, nerve root injury, persistent pain, adjacent segment disease, infection, bleeding, hardware failure/malposition, vascular injury, heart attack, death, stroke, fracture, and need for additional procedures were discussed with the patient. The benefit of surgery would be improvement in the patient's radiating arm pain. Explained that the patient may not get full relief of his pain with this surgery, especially any neck pain. The alternatives to surgical management would be continued monitoring, physical therapy, over-the-counter pain medications, injections, traction, and activity modification. All the patient's questions were answered to his satisfaction. After this discussion, the patient expressed understanding and elected to proceed with surgical intervention.  Procedure Description: The patient was met in the pre-operative holding area. The patient's identity and consent were verified. The operative site was marked. The patient's remaining questions about the surgery were answered. The patient was brought back to the operating room. General anesthesia was induced and an endotracheal tube was placed by the anesthesia staff. The patient was transferred to the flat top [place] table in the supine position. All bony prominences were well padded. A bump was placed underneath the patient's shoulders to extend the neck slightly.  The patient's shoulders were than taped to the bed to improve the fluoroscopic visualization during the procedure. The surgical area was cleansed with alcohol. Fluoroscopy was then brought in to check rotation on the AP image and to mark the levels on the lateral image. The patient's skin was then prepped and draped in a standard, sterile fashion. A time out was performed that identified the patient, the procedure, and the operative levels. All team members agreed with what was stated in the time out.   A left-sided transverse incision was made in the middle of the previously  marked levels.  Incision was taken sharply down through the skin and dermis. Electrocautery was used to dissect down to the level of the platysma. A Metzenbaum scissors was used to elevate flaps both cranially and caudally above the platysma. A small opening was made in the platysma with the Metzenbaum scissors. The scissors were then placed under the platysma and electrocautery was used on top of the scissors to split the platysma. Blunt dissection was carried out medial to the sternocleidomastoid to identify the omohyoid. The omohyoid was then dissected over its lateral aspect with the Metzenbaum scissors. An appendiceal retractor was placed around the carotid sheath to retract it laterally and a cloward retractor was placed medially to retractor the esophagus and trachea medially. The interval between these structures was then bluntly dissected with the Metzenbaum scissors. At this point, the prevertebral fascia was visible within the wound. A kittner was used to dissect the prevertebral fascia off the vertebral bodies and discs. A metallic sucker tip was placed over the disc thought to be the correct level. A lateral fluoroscopic shot was then used to confirm the level. Once the correct level was identified, electrocautery was used to mark the disc space. The retractors that were previously placed were then put back into the wound. The operative microscope was then brought in to improve lighting and visualization.    I started with the C3/4 disc space. Electrocautery was used to elevate the longus coli off the bone on each side. Shadowline retractor blades were then placed under the longus coli on each side and the self-retainer was attached to these retractor blades. A long handle fifteen blade was then used to incise the disc space along the endplates and longitudinally to connect these end plate incisions to create a rectangular incision in the disc. A pituitary was used to remove the incised disc material.  A 2 kerrison was used to remove the anterior osteophyte and square the inferior endplate of the cranial vertebra. A combination of straight and curved curettes were used to remove further disc material and the cartilaginous aspect of the endplates. This was done until both the inferior and superior endplates had been prepared from the uncus to uncus and from ventral vertebral body to the posterior osteophytes. At this point, a burr was then used to remove the posterior vertebral osteophytes. Once the PLL was visualized within the wound, a nerve hook was used to develop the plane between the PLL and the dura. While lifting up the PLL with the nerve hook, a 1 kerrison was placed under the PLL and used to remove the PLL. Once some of the PLL had been removed, a combination of 1 and 2 kerrisons were used to continue removing the PLL until it had been completely removed. A curved curette was then placed into the foramen and pointed ventrally. It was used to remove soft tissue and the deep aspect of the uncus. A nerve hook was then easily passed into the foramen. The same process was repeated for the other foramen. Lateral fluoroscopic imaging was used to guide the insertion of trials into the disc space in a serial fashion, starting with the smallest trial. Once a good fit was obtained, the trial was left in and neuromonitoring signals were checked. There were no changes from baseline. AP and lateral fluoroscopic images were obtained and confirmed satisfactory position of the trial. A 7mm structural allograft interbody spacer was selected for use as the final implant. The structural allograft interbody implant was then placed into the  prepared disc space and lightly malleted into place. AP and lateral fluoroscopic images was obtained and confirmed satisfactory position of the final implant.  The retractor blades were removed from the wound. Then, the same steps in the above paragraph were repeated for the C4/5 disc  space. The same 7mm structural allograft interbody spacer was impacted into the prepared C4/5 disc space. AP and lateral fluoroscopic images of the entire C spine were obtained which showed satisfactory positioning of both of the interbody implants.   The microscope was then moved away from the operative field and the retractor blades were removed from the wound. A cloward was then used to retract the esophagus and trachea away from the ventral vertebral bodies and another was used to retract the carotid and sternocleidomastoid laterally. A 42mm titanium plate was then selected and placed over the ventral aspects of the vertebral bodies. No soft tissue structures were underneath the plate. An awl was put into the wound and put into one of the screw holes in the plate. It was aimed slightly medially. A 15mm screw was then inserted into the hole that was made with the awl. The same steps were repeated with the awl and screw to place the same sized screws in the remaining holes within the plate. Again, the wound was checked and no soft tissue structures were seen underneath the plate.   Final AP and lateral fluoroscopic films were taken and confirmed satisfactory position of the plate and interbody implants. The screws were final tightened. Hemostasis was achieved. No significant bleeding seen in the wound or around the plate. The platysma was reapproximated using 2-0 vicryl. The deep dermal layer was closed with 2-0 vicryl. The skin was closed with a 3-0 monocryl. All counts were correct at the end of the procedure. Dermabond was applied over the skin. The wound was dressed with 4x4 gauze and tegaderm. The patient was awakened from anesthesia and transferred to a bed. The patient was brought back to the post-anesthesia care unit in stable condition by the anesthesia staff.   Post-operative plan: The patient will recover in the post-anesthesia care unit. His airway will be monitored as the plan is to discharge to  home. He will also need to void, have a stable neuro exam, pain under control, void, and tolerate PO prior to discharge. He will be out of bed without a collar. The patient will be seen in approximately 2 weeks in the office.   Ozell Ada, MD Orthopedic Surgeon

## 2024-02-20 NOTE — Brief Op Note (Signed)
 02/20/2024  1:01 PM  PATIENT:  Michael Fleming  53 y.o. male  PRE-OPERATIVE DIAGNOSIS:  CERVICAL RADICULOPATHY  POST-OPERATIVE DIAGNOSIS:  CERVICAL RADICULOPATHY  PROCEDURE:  Procedure(s) with comments: ANTERIOR CERVICAL DECOMPRESSION/DISCECTOMY FUSION 2 LEVELS (N/A) - C3-4, C4-5 ANTERIOR CERVICAL DISCECTOMY AND FUSION  SURGEON:  Surgeons and Role:    * Georgina Ozell LABOR, MD - Primary  PHYSICIAN ASSISTANT: none  ASSISTANTS: none   ANESTHESIA:   general  EBL:  50 mL   BLOOD ADMINISTERED:none  DRAINS: none   LOCAL MEDICATIONS USED:  NONE  SPECIMEN:  No Specimen  DISPOSITION OF SPECIMEN:  N/A  COUNTS:  YES  TOURNIQUET:  NONE  DICTATION: .Note written in EPIC  PLAN OF CARE: Discharge to home after PACU  PATIENT DISPOSITION:  PACU - hemodynamically stable.   Delay start of Pharmacological VTE agent (>24hrs) due to surgical blood loss or risk of bleeding: yes

## 2024-02-20 NOTE — Progress Notes (Signed)
 Orthopedic Note  Checked on patient again. Doing well. Having neck pain. No radiating arm pain. Neuro exam unchanged from pre-op. No swelling seen around neck. Breathing comfortably on room air. Dressing c/d/i. Okay for discharge. Will plan to see him in the office.   Michael Fleming [name] Ada, MD Orthopedic Surgeon

## 2024-02-20 NOTE — Transfer of Care (Signed)
 Immediate Anesthesia Transfer of Care Note  Patient: Michael Fleming  Procedure(s) Performed: ANTERIOR CERVICAL DECOMPRESSION/DISCECTOMY FUSION 2 LEVELS  Patient Location: PACU  Anesthesia Type:General  Level of Consciousness: drowsy  Airway & Oxygen Therapy: Patient Spontanous Breathing and Patient connected to face mask oxygen  Post-op Assessment: Report given to RN and Post -op Vital signs reviewed and stable  Post vital signs: Reviewed and stable  Last Vitals:  Vitals Value Taken Time  BP 114/74 02/20/24 12:48  Temp    Pulse 92 02/20/24 12:50  Resp 15 02/20/24 12:50  SpO2 100 % 02/20/24 12:50  Vitals shown include unfiled device data.  Last Pain:  Vitals:   02/20/24 0623  TempSrc:   PainSc: 5       Patients Stated Pain Goal: 3 (02/20/24 9376)  Complications: No notable events documented.

## 2024-02-20 NOTE — Anesthesia Preprocedure Evaluation (Addendum)
 Anesthesia Evaluation  Patient identified by MRN, date of birth, ID band Patient awake    Reviewed: Allergy & Precautions, NPO status , Patient's Chart, lab work & pertinent test results  Airway Mallampati: I  TM Distance: >3 FB Neck ROM: Limited    Dental  (+) Teeth Intact, Dental Advisory Given   Pulmonary neg pulmonary ROS   breath sounds clear to auscultation       Cardiovascular hypertension, Pt. on medications  Rhythm:Regular Rate:Normal     Neuro/Psych  Headaches, Seizures -,   Neuromuscular disease  negative psych ROS   GI/Hepatic negative GI ROS, Neg liver ROS,,,  Endo/Other  negative endocrine ROS    Renal/GU negative Renal ROS     Musculoskeletal  (+) Arthritis ,    Abdominal   Peds  Hematology negative hematology ROS (+)   Anesthesia Other Findings   Reproductive/Obstetrics                              Anesthesia Physical Anesthesia Plan  ASA: 2  Anesthesia Plan: General   Post-op Pain Management: Tylenol  PO (pre-op)*   Induction: Intravenous  PONV Risk Score and Plan: 3 and Ondansetron , Dexamethasone  and Midazolam   Airway Management Planned: Oral ETT and Video Laryngoscope Planned  Additional Equipment: None  Intra-op Plan:   Post-operative Plan: Extubation in OR  Informed Consent: I have reviewed the patients History and Physical, chart, labs and discussed the procedure including the risks, benefits and alternatives for the proposed anesthesia with the patient or authorized representative who has indicated his/her understanding and acceptance.     Dental advisory given  Plan Discussed with: CRNA  Anesthesia Plan Comments:          Anesthesia Quick Evaluation

## 2024-02-20 NOTE — Discharge Instructions (Signed)
 Orthopedic Surgery Discharge Instructions  Patient name: Michael Fleming Procedure Performed: C3-5 anterior cervical discectomy and fusion  Date of Surgery: 02/20/2024 Surgeon: Ozell Ada, MD  Pre-operative Diagnosis: cervical radiculopathy Post-operative Diagnosis: same as above  Discharge Date: 02/20/2024 Discharged to: home Discharge Condition: stable  Activity: You do not need to wear any brace or collar after surgery. You should refrain from bending, lifting, or twisting with objects greater than ten pounds until three months after surgery. You are encouraged to walk as much as desired. You can perform household activities such as cleaning dishes, doing laundry, vacuuming, etc. as long as the ten-pound restriction is followed.  Incision Care: Your incision site has a dressing over it. That dressing should remain in place and dry at all times for a total of one week after surgery. After one week, you can remove the dressing. Underneath the dressing, you will find skin glue. You should leave the skin glue in place. It will fall off with time. Do not pick, rub, or scrub at it. Do not put cream or lotion over the surgical area. After one week and once the dressing is off, it is okay to let soap and water run over your incision. Again, do not pick, scrub, or rub at the skin glue when bathing. Do not submerge (e.g., take a bath, swim, go in a hot tub, etc.) until six weeks after surgery. There may be some bloody drainage from the incision into the dressing after surgery. This is normal. You do not need to replace the dressing. Continue to leave it in place for the one week as instructed above. Should the dressing become saturated with blood or drainage, please call the office for further instructions.   Medications: You have been prescribed oxycodone . This is a narcotic pain medication and should only be taken as prescribed. You should not drink alcohol or operate heavy machinery (including  driving) while taking this medication. The oxycodone  can cause constipation as a side effect. For that reason, you have been prescribed senna and miralax. These are both laxatives. You do not need to take this medication if you develop diarrhea. Should you remain constipated even while taking these medications, please increase the dose of miralax to twice daily. Tylenol  has been prescribed to be taken every 8 hours, which will give you additional pain relief. Robaxin  is a muscle relaxer that has been prescribed to you for muscle spasm type pain. Take this medication as needed.   Do not take NSAIDs (ibuprofen , Aleve , Celebrex, naproxen , meloxicam , etc.) for the first 6 weeks after surgery as there is some evidence that their use may decrease the chances of successful fusion.   In order to set expectations for opioid prescriptions, you will only be prescribed opioids for a total of six weeks after surgery and, at two-weeks after surgery, your opioid prescription will start to tapered (decreased dosage and number of pills). If you have ongoing need for opioid medication six weeks after surgery, you will be referred to pain management. If you are already established with a provider that is giving you opioid medications, you should schedule an appointment with them for six weeks after surgery if you feel you are going to need another prescription. State law only allows for opioid prescriptions one week at a time. If you are running out of opioid medication near the end of the week, please call the office during business hours before running out so I can send you another prescription.   You  may resume any home blood thinners (aspirin, warfarin, lovenox, apixaban, plavix, xarelto, etc.) 72 hours after your surgery. Take these medications as they were previously prescribed.   Driving: You should not drive while taking narcotic pain medications. You should also not drive if you feel you cannot turn your body enough  to check your blind spots. You should start getting back to driving slowly and you may want to try driving in a parking lot before doing anything more.  Diet: You are safe to resume your regular diet. A common complication after cervical spine surgery is trouble swallowing especially if the incision was made over the front part of your neck. This complication happens often and, the vast majority of the time, it is a temporary problem that gets better with time. The first few days after surgery, you should take more bites than normal to break the food into smaller pieces before swallowing. With time as the swallowing becomes easier, you can gradually get back to taking bites like you normally would.   Reasons to Call the Office After Surgery: You should feel free to call the office with any concerns or questions you have in the post-operative period, but you should definitely notify the office if you develop: -shortness of breath, chest pain, or trouble breathing -excessive bleeding, drainage, redness, or swelling around the surgical site -fevers, chills, or pain that is getting worse with each passing day -persistent nausea or vomiting -new weakness in any extremity, new or worsening numbness or tingling in any extremity -numbness in the groin, bowel or bladder incontinence -other concerns about your surgery  -If you develop trouble breathing, you need to go the emergency department immediately  Follow Up Appointments: You should have an office appointment scheduled for approximately two weeks after surgery. If you do not remember when this appointment is or do not already have it scheduled, please call the office to schedule.   Office Information:  -Ozell Ada, MD -Phone number: [phone] -Address: [address]       [place], [place] [address]

## 2024-02-21 ENCOUNTER — Encounter (HOSPITAL_COMMUNITY): Payer: Self-pay | Admitting: Orthopedic Surgery

## 2024-02-28 ENCOUNTER — Telehealth: Payer: Self-pay

## 2024-02-28 NOTE — Telephone Encounter (Signed)
 Patient would like to know if his bandage comes off today.  Patient had surgery on 02/20/2024.  CB# [phone].  Please advise.  Thank you,.

## 2024-02-28 NOTE — Telephone Encounter (Signed)
 I called and advised patient of Dr. Jeraline message. And he understands this.

## 2024-03-04 ENCOUNTER — Ambulatory Visit: Admitting: Orthopedic Surgery

## 2024-03-04 ENCOUNTER — Other Ambulatory Visit

## 2024-03-04 DIAGNOSIS — Z981 Arthrodesis status: Secondary | ICD-10-CM

## 2024-03-04 MED ORDER — HYDROCODONE-ACETAMINOPHEN 5-325 MG PO TABS: 1.0000 | ORAL_TABLET | ORAL | 0 refills | Status: AC | PRN

## 2024-03-04 NOTE — Addendum Note (Signed)
 Addended by: GEORGINA SHARPER on: 03/04/2024 08:43 AM   Modules accepted: Orders

## 2024-03-04 NOTE — Progress Notes (Addendum)
 Orthopedic Surgery Post-operative Office Visit  Procedure: C3-5 ACDF Date of Surgery: 02/20/2024 (~2 weeks post-op)  Assessment: Patient is a 53 y.o. male who is doing well after surgery. Still with mild throat symptoms. Improving neck pain   Plan: -Operative plans complete -Out of bed as tolerated, no collar needed -No bending/lifting/twisting greater than 10 pounds -Okay to let soap/water run over incision but do not submerge -Pain management: Norco -Return to office in 4 weeks, x-rays needed at next visit: AP/lateral cervical  ___________________________________________________________________________   Subjective: Patient has been doing well since surgery.  He has noticed improving neck pain over time.  He still has some neck pain that he feels more of the lower cervical spine posteriorly.  He is not having as much shoulder pain as he had preoperatively.  He has not noticed any redness or drainage around his incision.  He did initially notice trouble with swallowing and a sore throat.  Now he just notes he has to clear his throat more but is not having difficulty with eating or drinking.  Objective:  General: no acute distress, appropriate affect Neurologic: alert, answering questions appropriately, following commands Respiratory: unlabored breathing on room air Skin: incision is well approximated with no erythema, induration, active/expressible drainage  MSK (spine):  -Strength exam      Left  Right Grip strength                5/5  5/5 Interosseus   5/5   5/5 Wrist extension  5/5  5/5 Wrist flexion   5/5  5/5 Elbow flexion   5/5  5/5 Deltoid    5/5  5/5  -Sensory exam    Sensation intact to light touch in C5-T1 nerve distributions of bilateral upper extremities   Imaging: X-rays of the cervical spine from 03/04/2024 were independently reviewed and interpreted, showing allograft interbody devices at C3/4 and C4/5.  Interbody's appear appropriate position.  No  lucency seen around the interbody's.  There is anterior mentation from C3-C5.  No lucency seen around the screws.  No fracture or dislocation seen.   Patient name: Michael Fleming Patient MRN: [id number] Date of visit: 03/04/24

## 2024-03-25 ENCOUNTER — Encounter: Payer: Self-pay | Admitting: Radiology

## 2024-04-01 ENCOUNTER — Ambulatory Visit (INDEPENDENT_AMBULATORY_CARE_PROVIDER_SITE_OTHER): Admitting: Orthopedic Surgery

## 2024-04-01 ENCOUNTER — Other Ambulatory Visit: Payer: Self-pay

## 2024-04-01 DIAGNOSIS — Z981 Arthrodesis status: Secondary | ICD-10-CM | POA: Diagnosis not present

## 2024-04-01 NOTE — Progress Notes (Signed)
 Orthopedic Surgery Post-operative Office Visit   Procedure: C3-5 ACDF Date of Surgery: 02/20/2024 (~6 weeks post-op)   Assessment: Patient is a 53 y.o. male who is doing well after surgery. Still with mild throat symptoms. Improving neck pain     Plan: -Operative plans complete -Out of bed as tolerated, no collar needed -No bending/lifting/twisting greater than 10 pounds -Okay to submerge wound at this point -Pain management: Tylenol  as needed -Start esophageal mobilization -Return to office in 6 weeks, x-rays needed at next visit: AP/lateral/flex/ex cervical   ___________________________________________________________________________     Subjective: Patient has noticed improvement in his neck pain since he was last in the office.  He still has some mild neck pain.  His shoulder pain is still better than where he was preoperatively.  He does note some pain in the left shoulder but it is tolerable now.  He is not taking any medications regularly for pain.  He has noticed some numbness around the incision over his anterior neck.  No numbness or paresthesias in the upper extremities.  He notes sometimes it feels like something is stuck in his throat.  This is not a consistent symptom.  No other symptoms of dysphagia.  His girlfriend reports that he has been snoring more at night since the surgery.   Objective:   General: no acute distress, appropriate affect Neurologic: alert, answering questions appropriately, following commands Respiratory: unlabored breathing on room air Skin: incision is well healed with no erythema, induration, active/expressible drainage   MSK (spine):   -Strength exam                                                   Left                  Right Grip strength                5/5                  5/5 Interosseus                  5/5                  5/5 Wrist extension            5/5                  5/5 Wrist flexion                 5/5                   5/5 Elbow flexion                5/5                  5/5 Deltoid                          5/5                  5/5   -Sensory exam                           Sensation intact to light touch in C5-T1 nerve distributions of bilateral upper extremities  Imaging: XRs of the cervical spine from 04/01/2024 were independently reviewed and interpreted, showing anterior instrumentation from C3-C5.  No lucency seen around the screws.  No screws are backed out.  There are allograft interbody spacers from C3/4 to C4/5.  Allograft appear in appropriate position.  No fracture or dislocation seen.  No translation seen on the flexion/tension views.     Patient name: Michael Fleming Patient MRN: [id number] Date of visit: 04/01/24

## 2024-04-10 ENCOUNTER — Ambulatory Visit (INDEPENDENT_AMBULATORY_CARE_PROVIDER_SITE_OTHER): Payer: Self-pay | Admitting: Nurse Practitioner

## 2024-04-10 ENCOUNTER — Encounter: Payer: Self-pay | Admitting: Nurse Practitioner

## 2024-04-10 VITALS — BP 129/89 | HR 80 | Ht 67.0 in | Wt 210.0 lb

## 2024-04-10 DIAGNOSIS — I10 Essential (primary) hypertension: Secondary | ICD-10-CM | POA: Diagnosis not present

## 2024-04-10 DIAGNOSIS — E7849 Other hyperlipidemia: Secondary | ICD-10-CM

## 2024-04-10 DIAGNOSIS — H547 Unspecified visual loss: Secondary | ICD-10-CM | POA: Diagnosis not present

## 2024-04-10 LAB — POCT GLYCOSYLATED HEMOGLOBIN (HGB A1C): Hemoglobin A1C: 6 % — AB (ref 4.0–5.6)

## 2024-04-10 MED ORDER — LOSARTAN POTASSIUM 50 MG PO TABS: 50.0000 mg | ORAL_TABLET | Freq: Every day | ORAL | 1 refills | Status: AC

## 2024-04-10 MED ORDER — ATORVASTATIN CALCIUM 40 MG PO TABS: 40.0000 mg | ORAL_TABLET | Freq: Every day | ORAL | 1 refills | Status: AC

## 2024-04-10 MED ORDER — AMLODIPINE BESYLATE 10 MG PO TABS: 10.0000 mg | ORAL_TABLET | Freq: Every day | ORAL | 1 refills | Status: AC

## 2024-04-10 NOTE — Patient Instructions (Signed)
 [facility] (accepts Medicaid, Medicare, [employer], and/or Self-Pay) [facility]  [address], [address],  [place], [place] [address] [phone]  [facility]  [address] [place], [place] [address] [phone]  [facility]  [facility] [place] [address] [place], [place] [address] *Located next to [facility]  [phone]  [facility]  [address] [place], [place] [address]  *Located next to [facility] [phone]  [facility]  [address] [place], [place] [address] [phone]  [facility]  [address] [place], [place] [address]  [phone] *Located inside [facility]  [address] [place], [place] [address] [phone]  [facility] [address] [place], [place] [address] [phone] [address] [place], [place] [address] [phone]  [facility] [address] [place], [place] [address] [phone]  [facility]  [address] [place], [place] [address] [phone]  [facility]  [address] [place], [place] [address] [phone]

## 2024-04-10 NOTE — Progress Notes (Signed)
 Subjective   Patient ID: Michael Fleming, male    DOB: [birth year], 53 y.o.   MRN: [id number]  Chief Complaint  Patient presents with   Hypertension   Hyperlipidemia    Referring provider: Oley Bascom RAMAN, NP  Michael Fleming is a 53 y.o. male with Past Medical History: No date: Allergy No date: Biceps tendon tear     Comment:  right 12/2018: Heart palpitations No date: Hyperlipidemia No date: Hypertension 12/2018: Hypokalemia No date: Seizures (HCC)     Comment:  last seizure age 38 due to blood clot- 1981 none since    HPI  Michael Fleming 53 y.o. male  has a past medical history of Allergy, Biceps tendon tear, Heart palpitations (12/2018), Hyperlipidemia, Hypertension, Hypokalemia (12/2018), and Seizures (HCC).    Patient presents today for follow-up visit for chronic conditions.  Overall he has been doing well.  Patient is compliant with medications.  He states that he does not have any new issues or concerns today.  He does need blood work today.  Denies f/c/s, n/v/d, hemoptysis, PND, leg swelling. Denies chest pain or edema.    No Known Allergies  Immunization History  Administered Date(s) Administered   Influenza,inj,Quad PF,6+ Mos 01/20/2017, 04/08/2022, 01/24/2023   PFIZER(Purple Top)SARS-COV-2 Vaccination 10/29/2019, 01/07/2020   Tdap 09/29/2016    Tobacco History: Social History   Tobacco Use  Smoking Status Never  Smokeless Tobacco Never   Counseling given: Not Answered   Outpatient Encounter Medications as of 04/10/2024  Medication Sig   [DISCONTINUED] amLODipine  (NORVASC ) 10 MG tablet Take 1 tablet (10 mg total) by mouth daily.   [DISCONTINUED] atorvastatin  (LIPITOR) 40 MG tablet TAKE 1 TABLET BY MOUTH EVERY DAY   [DISCONTINUED] losartan  (COZAAR ) 50 MG tablet Take 1 tablet (50 mg total) by mouth daily.   amLODipine  (NORVASC ) 10 MG tablet Take 1 tablet (10 mg total) by mouth daily.   atorvastatin  (LIPITOR) 40 MG tablet Take 1 tablet (40 mg  total) by mouth daily.   losartan  (COZAAR ) 50 MG tablet Take 1 tablet (50 mg total) by mouth daily.   sildenafil  (VIAGRA ) 100 MG tablet Take 0.5-1 tablets (50-100 mg total) by mouth daily as needed for erectil dysfunction. (Patient not taking: Reported on 04/10/2024)   No facility-administered encounter medications on file as of 04/10/2024.    Review of Systems  Review of Systems  Constitutional: Negative.   HENT: Negative.    Cardiovascular: Negative.   Gastrointestinal: Negative.   Allergic/Immunologic: Negative.   Neurological: Negative.   Psychiatric/Behavioral: Negative.       Objective:   BP 129/89 (BP Location: Left Arm, Patient Position: Sitting, Cuff Size: Large)   Pulse 80   Ht 5' 7 (1.702 m)   Wt 210 lb (95.3 kg)   SpO2 99%   BMI 32.89 kg/m   Wt Readings from Last 5 Encounters:  04/10/24 210 lb (95.3 kg)  02/20/24 219 lb (99.3 kg)  02/15/24 219 lb (99.3 kg)  11/16/23 224 lb (101.6 kg)  10/09/23 223 lb 12.8 oz (101.5 kg)     Physical Exam Vitals and nursing note reviewed.  Constitutional:      General: He is not in acute distress.    Appearance: He is well-developed.  Cardiovascular:     Rate and Rhythm: Normal rate and regular rhythm.  Pulmonary:     Effort: Pulmonary effort is normal.     Breath sounds: Normal breath sounds.  Skin:    General: Skin is warm and  dry.  Neurological:     Mental Status: He is alert and oriented to person, place, and time.       Assessment & Plan:   Decreased vision -     Ambulatory referral to Optometry  Essential hypertension -     amLODIPine  Besylate; Take 1 tablet (10 mg total) by mouth daily.  Dispense: 90 tablet; Refill: 1 -     Losartan  Potassium; Take 1 tablet (50 mg total) by mouth daily.  Dispense: 90 tablet; Refill: 1 -     POCT glycosylated hemoglobin (Hb A1C)  Other hyperlipidemia -     Atorvastatin  Calcium ; Take 1 tablet (40 mg total) by mouth daily.  Dispense: 30 tablet; Refill: 1      Return in about 6 months (around 10/08/2024).   Bascom GORMAN Borer, NP 04/15/2024

## 2024-05-03 ENCOUNTER — Other Ambulatory Visit: Payer: Self-pay | Admitting: Physician Assistant

## 2024-05-03 ENCOUNTER — Other Ambulatory Visit: Payer: Self-pay | Admitting: Nurse Practitioner

## 2024-05-03 DIAGNOSIS — G8929 Other chronic pain: Secondary | ICD-10-CM

## 2024-05-03 NOTE — Telephone Encounter (Signed)
 Has been seeing dr georgina where he has had surgery so will defer to him

## 2024-05-13 ENCOUNTER — Other Ambulatory Visit (INDEPENDENT_AMBULATORY_CARE_PROVIDER_SITE_OTHER): Payer: Self-pay

## 2024-05-13 ENCOUNTER — Ambulatory Visit: Admitting: Orthopedic Surgery

## 2024-05-13 DIAGNOSIS — Z981 Arthrodesis status: Secondary | ICD-10-CM

## 2024-05-13 NOTE — Progress Notes (Signed)
 Orthopedic Surgery Post-operative Office Visit   Procedure: C3-5 ACDF Date of Surgery: 02/20/2024 (~3 months post-op)   Assessment: Patient is a 53 y.o. male who is doing well after surgery. Still with occasional difficulty with swallowing.  Gets neck pain if he rotates his neck a lot.  Radiating arm pain improved     Plan: -No spine specific precautions at this time -Can gradually return to full activity -Pain management: Tylenol  as needed -Return to office in 3 months, x-rays needed at next visit: AP/lateral/flex/ex cervical   ___________________________________________________________________________     Subjective: Patient is not having any significant radiating arm pain.  He does notice left shoulder pain if he lifts heavier objects.  He does notice some neck pain if he rotates his neck multiple times in succession.  Otherwise, he does not have any neck pain.  He has not noticed any redness or drainage around his incision.  He still has difficulty with swallowing at times.  He said will often feel like something is stuck in his throat.  It is not all the time but he notices it if he is eating a lot.   Objective:   General: no acute distress, appropriate affect Neurologic: alert, answering questions appropriately, following commands Respiratory: unlabored breathing on room air Skin: incision is well healed   MSK (spine):   -Strength exam                                                   Left                  Right Grip strength                5/5                  5/5 Interosseus                  5/5                  5/5 Wrist extension            5/5                  5/5 Wrist flexion                 5/5                  5/5 Elbow flexion                5/5                  5/5 Deltoid                          5/5                  5/5   -Sensory exam                           Sensation intact to light touch in C5-T1 nerve distributions of bilateral upper extremities      Imaging: XRs of the cervical spine from 05/13/2024 were independently reviewed and interpreted, showing allograft interbody devices at C3/4 and C4/5. There is some subsidence seen into the  C3 vertebra. Subtle lucency around the C3/4 interbody device. Anterior instrumentation seen from C3-5. No lucency seen around the screws. None of the screws have backed out. No fracture or dislocation seen. No instability on flexion/extension views.      Patient name: Michael Fleming Patient MRN: [id number] Date of visit: 05/13/2024

## 2024-08-12 ENCOUNTER — Ambulatory Visit: Admitting: Orthopedic Surgery

## 2024-10-09 ENCOUNTER — Ambulatory Visit: Payer: Self-pay | Admitting: Nurse Practitioner
